# Patient Record
Sex: Female | Born: 1945
Health system: Southern US, Community
[De-identification: ages and names within clinical notes are randomized; demographics above are authoritative.]

## PROBLEM LIST (undated history)

## (undated) DIAGNOSIS — I517 Cardiomegaly: Secondary | ICD-10-CM

## (undated) DIAGNOSIS — J309 Allergic rhinitis, unspecified: Secondary | ICD-10-CM

## (undated) DIAGNOSIS — I7121 Aneurysm of the ascending aorta, without rupture: Secondary | ICD-10-CM

## (undated) DIAGNOSIS — I4892 Unspecified atrial flutter: Secondary | ICD-10-CM

## (undated) DIAGNOSIS — E039 Hypothyroidism, unspecified: Secondary | ICD-10-CM

## (undated) DIAGNOSIS — M75122 Complete rotator cuff tear or rupture of left shoulder, not specified as traumatic: Secondary | ICD-10-CM

## (undated) DIAGNOSIS — G4733 Obstructive sleep apnea (adult) (pediatric): Secondary | ICD-10-CM

## (undated) DIAGNOSIS — G4700 Insomnia, unspecified: Secondary | ICD-10-CM

## (undated) DIAGNOSIS — I10 Essential (primary) hypertension: Secondary | ICD-10-CM

## (undated) DIAGNOSIS — Z7901 Long term (current) use of anticoagulants: Secondary | ICD-10-CM

## (undated) DIAGNOSIS — M1990 Unspecified osteoarthritis, unspecified site: Secondary | ICD-10-CM

## (undated) DIAGNOSIS — T8859XA Other complications of anesthesia, initial encounter: Secondary | ICD-10-CM

## (undated) DIAGNOSIS — I77819 Aortic ectasia, unspecified site: Secondary | ICD-10-CM

## (undated) DIAGNOSIS — R001 Bradycardia, unspecified: Secondary | ICD-10-CM

## (undated) DIAGNOSIS — I700 Atherosclerosis of aorta: Secondary | ICD-10-CM

## (undated) DIAGNOSIS — R600 Localized edema: Secondary | ICD-10-CM

## (undated) DIAGNOSIS — I209 Angina pectoris, unspecified: Secondary | ICD-10-CM

## (undated) DIAGNOSIS — I5030 Unspecified diastolic (congestive) heart failure: Secondary | ICD-10-CM

## (undated) DIAGNOSIS — J449 Chronic obstructive pulmonary disease, unspecified: Secondary | ICD-10-CM

## (undated) DIAGNOSIS — Z87891 Personal history of nicotine dependence: Secondary | ICD-10-CM

## (undated) DIAGNOSIS — I4891 Unspecified atrial fibrillation: Secondary | ICD-10-CM

## (undated) DIAGNOSIS — G4730 Sleep apnea, unspecified: Secondary | ICD-10-CM

## (undated) DIAGNOSIS — Z9842 Cataract extraction status, left eye: Secondary | ICD-10-CM

## (undated) DIAGNOSIS — I2510 Atherosclerotic heart disease of native coronary artery without angina pectoris: Secondary | ICD-10-CM

## (undated) DIAGNOSIS — I739 Peripheral vascular disease, unspecified: Secondary | ICD-10-CM

## (undated) DIAGNOSIS — K219 Gastro-esophageal reflux disease without esophagitis: Secondary | ICD-10-CM

## (undated) DIAGNOSIS — E785 Hyperlipidemia, unspecified: Secondary | ICD-10-CM

## (undated) HISTORY — DX: Cardiomegaly: I51.7

## (undated) HISTORY — DX: Hyperlipidemia, unspecified: E78.5

## (undated) HISTORY — PX: APPENDECTOMY: SHX54

## (undated) HISTORY — PX: HYSTERECTOMY, VAGINAL, WITH SALPINGO-OOPHORECTOMY: SHX7589

## (undated) HISTORY — DX: Hypothyroidism, unspecified: E03.9

## (undated) HISTORY — PX: CORONARY ARTERY BYPASS GRAFT: SHX141

## (undated) HISTORY — DX: Atherosclerotic heart disease of native coronary artery without angina pectoris: I25.10

## (undated) HISTORY — DX: Allergic rhinitis, unspecified: J30.9

## (undated) HISTORY — PX: ABDOMINAL HYSTERECTOMY: SHX81

## (undated) HISTORY — DX: Essential (primary) hypertension: I10

## (undated) HISTORY — PX: TONSILLECTOMY: SUR1361

---

## 2000-09-14 ENCOUNTER — Other Ambulatory Visit: Admission: RE | Admit: 2000-09-14 | Discharge: 2000-09-14 | Payer: Self-pay | Admitting: *Deleted

## 2004-09-28 ENCOUNTER — Ambulatory Visit: Payer: Self-pay | Admitting: General Surgery

## 2004-10-20 ENCOUNTER — Ambulatory Visit: Payer: Self-pay

## 2004-11-09 ENCOUNTER — Ambulatory Visit: Payer: Self-pay | Admitting: Urology

## 2005-09-23 HISTORY — PX: CARDIAC CATHETERIZATION: SHX172

## 2005-10-14 ENCOUNTER — Other Ambulatory Visit: Payer: Self-pay

## 2005-10-14 ENCOUNTER — Inpatient Hospital Stay: Payer: Self-pay

## 2005-10-15 ENCOUNTER — Other Ambulatory Visit: Payer: Self-pay

## 2005-10-17 DIAGNOSIS — I2129 ST elevation (STEMI) myocardial infarction involving other sites: Secondary | ICD-10-CM

## 2005-10-17 HISTORY — PX: LEFT HEART CATH AND CORONARY ANGIOGRAPHY: CATH118249

## 2005-10-17 HISTORY — DX: ST elevation (STEMI) myocardial infarction involving other sites: I21.29

## 2005-10-18 ENCOUNTER — Other Ambulatory Visit: Payer: Self-pay

## 2005-10-18 HISTORY — PX: CORONARY ANGIOPLASTY WITH STENT PLACEMENT: SHX49

## 2005-10-19 ENCOUNTER — Other Ambulatory Visit: Payer: Self-pay

## 2005-10-20 ENCOUNTER — Other Ambulatory Visit: Payer: Self-pay

## 2005-11-09 ENCOUNTER — Encounter: Payer: Self-pay | Admitting: Internal Medicine

## 2005-11-16 ENCOUNTER — Ambulatory Visit: Payer: Self-pay

## 2005-11-22 ENCOUNTER — Ambulatory Visit: Payer: Self-pay | Admitting: Internal Medicine

## 2005-11-23 ENCOUNTER — Encounter: Payer: Self-pay | Admitting: Internal Medicine

## 2005-12-24 ENCOUNTER — Encounter: Payer: Self-pay | Admitting: Internal Medicine

## 2006-01-23 ENCOUNTER — Encounter: Payer: Self-pay | Admitting: Internal Medicine

## 2006-02-23 ENCOUNTER — Encounter: Payer: Self-pay | Admitting: Internal Medicine

## 2007-01-18 ENCOUNTER — Ambulatory Visit: Payer: Self-pay

## 2007-07-16 ENCOUNTER — Encounter: Payer: Self-pay | Admitting: Obstetrics & Gynecology

## 2007-07-25 ENCOUNTER — Encounter: Payer: Self-pay | Admitting: Obstetrics & Gynecology

## 2007-08-24 ENCOUNTER — Encounter: Payer: Self-pay | Admitting: Obstetrics & Gynecology

## 2008-04-01 ENCOUNTER — Ambulatory Visit: Payer: Self-pay | Admitting: Family Medicine

## 2008-10-09 ENCOUNTER — Ambulatory Visit: Payer: Self-pay | Admitting: Family Medicine

## 2009-04-02 ENCOUNTER — Ambulatory Visit: Payer: Self-pay | Admitting: Family Medicine

## 2009-11-20 ENCOUNTER — Ambulatory Visit: Payer: Self-pay | Admitting: General Surgery

## 2009-11-20 LAB — HM COLONOSCOPY

## 2010-02-24 DIAGNOSIS — Z951 Presence of aortocoronary bypass graft: Secondary | ICD-10-CM

## 2010-02-24 HISTORY — PX: CORONARY ARTERY BYPASS GRAFT: SHX141

## 2010-02-24 HISTORY — DX: Presence of aortocoronary bypass graft: Z95.1

## 2010-03-25 ENCOUNTER — Encounter: Payer: Self-pay | Admitting: Internal Medicine

## 2010-08-16 ENCOUNTER — Ambulatory Visit: Payer: Self-pay | Admitting: Family Medicine

## 2010-12-01 LAB — HM DEXA SCAN: HM Dexa Scan: NORMAL

## 2011-09-06 ENCOUNTER — Ambulatory Visit: Payer: Self-pay | Admitting: Family Medicine

## 2012-10-09 ENCOUNTER — Ambulatory Visit: Payer: Self-pay | Admitting: Family Medicine

## 2013-05-03 ENCOUNTER — Encounter: Payer: Self-pay | Admitting: Pulmonary Disease

## 2013-05-06 ENCOUNTER — Institutional Professional Consult (permissible substitution): Payer: Medicare Other | Admitting: Pulmonary Disease

## 2013-05-24 ENCOUNTER — Ambulatory Visit (INDEPENDENT_AMBULATORY_CARE_PROVIDER_SITE_OTHER): Payer: Medicare Other | Admitting: Pulmonary Disease

## 2013-05-24 ENCOUNTER — Encounter: Payer: Self-pay | Admitting: Pulmonary Disease

## 2013-05-24 VITALS — BP 130/62 | HR 58 | Ht 66.5 in | Wt 180.0 lb

## 2013-05-24 DIAGNOSIS — J449 Chronic obstructive pulmonary disease, unspecified: Secondary | ICD-10-CM

## 2013-05-24 DIAGNOSIS — R0602 Shortness of breath: Secondary | ICD-10-CM

## 2013-05-24 MED ORDER — ALBUTEROL SULFATE HFA 108 (90 BASE) MCG/ACT IN AERS: 2.0000 | INHALATION_SPRAY | Freq: Four times a day (QID) | RESPIRATORY_TRACT | Status: DC | PRN

## 2013-05-24 NOTE — Patient Instructions (Signed)
We will order a Chest x-ray for you Use Spiriva daily no matter how you feel Use the albuterol 2 puffs every 4-6 hours as needed for shortness of breath  We will see you back in 4 weeks or sooner if needed

## 2013-05-24 NOTE — Progress Notes (Signed)
Subjective:    Patient ID: Penny Reed, female    DOB: 27-Mar-1946, 68 y.o.   MRN: [id number]  HPI  PCP [facility]  Ms. Eichholz is here to see me for shortness of breath.  She has noticed shortness of breath with high intesnsity exercise, climbing stairs, carrying weights.  She has noticed this for the last 3-4 months.  She was told once that she might have COPD.  She previously smoked 1-2 packs a week (closet smoker) and quit in 2007.  She smoked for about 40 years.  No chronic cough.  No mucus production.  Childhood normal without respiratory problems.  There are not triggers in the environement or positional changes that make it worse.  She has not had chest pain during this time.  She has not noticed any bleeding.   She does not have leg swelling.  She worked out 5-7 times a week and can spend an hour on an elliptical for 40 minutes to an hour.  She notices dyspnea with working out, but she hasn't necessarily slowed down.   She has been seen by a cardiologist recently who felt that she didn't have clear cardiac cause of dyspnea.  She a CABG in 2007.  Past Medical History  Diagnosis Date  . CAD (coronary artery disease)   . Hyperlipidemia   . Allergic rhinitis   . Hypothyroidism   . Hypertension   . LVH (left ventricular hypertrophy)      Family History  Problem Relation Age of Onset  . Diabetes Mother   . Coronary artery disease Father   . Heart attack Father   . Hyperlipidemia Sister   . Heart attack Brother   . Cancer - Colon Paternal Grandmother   . Asthma Sister   . Scleroderma Sister   . Thyroid disease Sister      History   Social History  . Marital Status: Single    Spouse Name: N/A    Number of Children: N/A  . Years of Education: N/A   Occupational History  . Not on file.   Social History Main Topics  . Smoking status: Former Smoker -- 0.25 packs/day for 25 years    Types: Cigarettes    Quit date: 04/25/2005  . Smokeless tobacco: Never Used  . Alcohol  Use: Yes     Comment: 5 drinks/wk  . Drug Use: No  . Sexual Activity: Not on file   Other Topics Concern  . Not on file   Social History Narrative  . No narrative on file     Allergies  Allergen Reactions  . Adhesive [Tape]     Band-aids irritate skin     Outpatient Prescriptions Prior to Visit  Medication Sig Dispense Refill  . 5-Hydroxytryptophan (5-HTP) 100 MG CAPS Take 1 capsule by mouth at bedtime.      [name] aspirin 81 MG tablet Take 81 mg by mouth daily.      [name] atorvastatin (LIPITOR) 80 MG tablet Take 80 mg by mouth daily.      [name] azelastine (ASTELIN) 137 MCG/SPRAY nasal spray Place 2 sprays into both nostrils 2 (two) times daily. Use in each nostril as directed      . Calcium Carbonate (CALCIUM 600 PO) Take 1 tablet by mouth daily.      [name] estradiol (ESTRACE) 0.5 MG tablet Take 0.5 mg by mouth daily.      [name] glucosamine-chondroitin 500-400 MG tablet Take 2 tablets by mouth daily.      [name]  levothyroxine (SYNTHROID, LEVOTHROID) 125 MCG tablet Take 125 mcg by mouth daily before breakfast.      . Multiple Vitamin (MULTIVITAMIN) tablet Take 1 tablet by mouth daily.      . Omega-3 Fatty Acids (FISH OIL) 1000 MG CAPS Take 4 capsules by mouth daily.      . quinapril (ACCUPRIL) 20 MG tablet Take 20 mg by mouth daily.      . cetirizine (ZYRTEC) 10 MG tablet Take 10 mg by mouth daily as needed.       . zolpidem (AMBIEN) 10 MG tablet Take 10 mg by mouth at bedtime as needed for sleep.       No facility-administered medications prior to visit.      Review of Systems  Constitutional: Negative for fever and unexpected weight change.  HENT: Positive for sinus pressure. Negative for congestion, dental problem, ear pain, nosebleeds, postnasal drip, rhinorrhea, sneezing, sore throat and trouble swallowing.   Eyes: Negative for redness and itching.  Respiratory: Positive for shortness of breath. Negative for cough, chest tightness and wheezing.   Cardiovascular: Negative for palpitations and  leg swelling.  Gastrointestinal: Negative for nausea and vomiting.  Genitourinary: Negative for dysuria.  Musculoskeletal: Negative for joint swelling.  Skin: Negative for rash.  Neurological: Negative for headaches.  Hematological: Does not bruise/bleed easily.  Psychiatric/Behavioral: Negative for dysphoric mood. The patient is not nervous/anxious.        Objective:   Physical Exam  Filed Vitals:   05/24/13 1426  BP: 130/62  Pulse: 58  Height: 5' 6.5" (1.689 m)  Weight: 180 lb (81.647 kg)  SpO2: 98%   Gen: well appearing, no acute distress HEENT: NCAT, PERRL, EOMi, OP clear, neck supple without masses PULM: CTA B CV: RRR, no mgr, no JVD AB: BS+, soft, nontender, no hsm Ext: warm, no edema, no clubbing, no cyanosis Derm: no rash or skin breakdown Neuro: A&Ox4, CN II-XII intact, strength 5/5 in all 4 extremities  05/24/2013 spirometry> moderate airflow obstruction (FEV1 77% pred)     Assessment & Plan:   COPD, moderate COPD: GOLD Grade A Combined recommendations from the [employer], [employer] of [employer]icians, Designer, television/film set, European Respiratory Society (Qaseem A et al, Ann Intern Med. 2011;155(3):179) recommends tobacco cessation, pulmonary rehab (for symptomatic patients with an FEV1 < 50% predicted), supplemental oxygen (for patients with SaO2 <88% or paO2 <55), and appropriate bronchodilator therapy.  In regards to long acting bronchodilators, they recommend monotherapy (FEV1 60-80% with symptoms weak evidence, FEV1 with symptoms <60% strong evidence), or combination therapy (FEV1 <60% with symptoms, strong recommendation, moderate evidence).  One should also provide patients with annual immunizations and consider therapy for prevention of COPD exacerbations (ie. roflumilast or azithromycin) when appopriate.  -obtain CXR -O2 therapy: Not indicated -Immunizations: UTD -Tobacco use: quit 2007 -Exercise: Encouraged regular  activity -Bronchodilator therapy: Add spiriva for a month and follow up, encouraged HFA Albuterol prn and before exercise -Exacerbation prevention: Spiriva     Updated Medication List Outpatient Encounter Prescriptions as of 05/24/2013  Medication Sig  . 5-Hydroxytryptophan (5-HTP) 100 MG CAPS Take 1 capsule by mouth at bedtime.  [name] aspirin 81 MG tablet Take 81 mg by mouth daily.  [name] atorvastatin (LIPITOR) 80 MG tablet Take 80 mg by mouth daily.  [name] azelastine (ASTELIN) 137 MCG/SPRAY nasal spray Place 2 sprays into both nostrils 2 (two) times daily. Use in each nostril as directed  . Calcium Carbonate (CALCIUM 600 PO) Take 1 tablet by mouth  daily.  . estradiol (ESTRACE) 0.5 MG tablet Take 0.5 mg by mouth daily.  [name]. glucosamine-chondroitin 500-400 MG tablet Take 2 tablets by mouth daily.  [name]. levothyroxine (SYNTHROID, LEVOTHROID) 125 MCG tablet Take 125 mcg by mouth daily before breakfast.  . Multiple Vitamin (MULTIVITAMIN) tablet Take 1 tablet by mouth daily.  . Omega-3 Fatty Acids (FISH OIL) 1000 MG CAPS Take 4 capsules by mouth daily.  [name]. omeprazole (PRILOSEC) 20 MG capsule   . quinapril (ACCUPRIL) 20 MG tablet Take 20 mg by mouth daily.  [name]. albuterol (PROAIR HFA) 108 (90 BASE) MCG/ACT inhaler Inhale 2 puffs into the lungs every 6 (six) hours as needed for wheezing or shortness of breath.  . cetirizine (ZYRTEC) 10 MG tablet Take 10 mg by mouth daily as needed.   . [DISCONTINUED] zolpidem (AMBIEN) 10 MG tablet Take 10 mg by mouth at bedtime as needed for sleep.

## 2013-05-24 NOTE — Assessment & Plan Note (Addendum)
COPD: GOLD Grade A Combined recommendations from the [employer], [employer], Designer, television/film setAmerican Thoracic Society, European Respiratory Society (Qaseem A et al, Ann Intern Med. 2011;155(3):179) recommends tobacco cessation, pulmonary rehab (for symptomatic patients with an FEV1 < 50% predicted), supplemental oxygen (for patients with SaO2 <88% or paO2 <55), and appropriate bronchodilator therapy.  In regards to long acting bronchodilators, they recommend monotherapy (FEV1 60-80% with symptoms weak evidence, FEV1 with symptoms <60% strong evidence), or combination therapy (FEV1 <60% with symptoms, strong recommendation, moderate evidence).  One should also provide patients with annual immunizations and consider therapy for prevention of COPD exacerbations (ie. roflumilast or azithromycin) when appopriate.  -obtain CXR -O2 therapy: Not indicated -Immunizations: UTD -Tobacco use: quit 2007 -Exercise: Encouraged regular activity -Bronchodilator therapy: Add spiriva for a month and follow up, encouraged HFA Albuterol prn and before exercise -Exacerbation prevention: Spiriva

## 2013-06-14 ENCOUNTER — Telehealth: Payer: Self-pay | Admitting: Pulmonary Disease

## 2013-06-14 NOTE — Telephone Encounter (Signed)
Called and spoke w/ pt. Advised her they do CXR over at [place] office. Nothing further needed

## 2013-06-19 ENCOUNTER — Ambulatory Visit (INDEPENDENT_AMBULATORY_CARE_PROVIDER_SITE_OTHER)
Admission: RE | Admit: 2013-06-19 | Discharge: 2013-06-19 | Disposition: A | Discharge status: Home / Self Care | Payer: Medicare Other | Source: Ambulatory Visit | Attending: Pulmonary Disease | Admitting: Pulmonary Disease

## 2013-06-19 DIAGNOSIS — R0602 Shortness of breath: Secondary | ICD-10-CM

## 2013-06-21 ENCOUNTER — Telehealth: Payer: Self-pay

## 2013-06-21 ENCOUNTER — Encounter: Payer: Self-pay | Admitting: Pulmonary Disease

## 2013-06-21 ENCOUNTER — Ambulatory Visit (INDEPENDENT_AMBULATORY_CARE_PROVIDER_SITE_OTHER): Payer: Medicare Other | Admitting: Pulmonary Disease

## 2013-06-21 VITALS — BP 136/64 | HR 60 | Ht 66.5 in | Wt 181.8 lb

## 2013-06-21 DIAGNOSIS — Z720 Tobacco use: Secondary | ICD-10-CM

## 2013-06-21 DIAGNOSIS — F17200 Nicotine dependence, unspecified, uncomplicated: Secondary | ICD-10-CM

## 2013-06-21 DIAGNOSIS — J449 Chronic obstructive pulmonary disease, unspecified: Secondary | ICD-10-CM

## 2013-06-21 MED ORDER — TIOTROPIUM BROMIDE MONOHYDRATE 18 MCG IN CAPS: 18.0000 ug | ORAL_CAPSULE | Freq: Every day | RESPIRATORY_TRACT | Status: DC

## 2013-06-21 NOTE — Progress Notes (Signed)
   Subjective:    Patient ID: Penny Reed, female    DOB: March 23, 1946, 68 y.o.   MRN: [id number]  Synopsis: GOLD Grade A COPD, 05/24/2013 spirometry> moderate airflow obstruction (FEV1 77% pred)  HPI  06/21/2013 ROV > Eber Jonesarolyn really did well with Spiriva and albuterol. She stated that she could tell a difference when she exercised while taking the medicines and when she ran out of the Spiriva a few days ago she noticed a difference in dyspnea with exertion.  She is doing well otherwise. She sometimes forgets to take the albuterol before exercise.  She has not experienced dyspnea outside of exercise.  Past Medical History  Diagnosis Date  . CAD (coronary artery disease)   . Hyperlipidemia   . Allergic rhinitis   . Hypothyroidism   . Hypertension   . LVH (left ventricular hypertrophy)      Review of Systems     Objective:   Physical Exam Filed Vitals:   06/21/13 1447  BP: 136/64  Pulse: 60  Height: 5' 6.5" (1.689 m)  Weight: 181 lb 12.8 oz (82.464 kg)  SpO2: 98%  RA  Gen: well appearing HEENT:NCAT, EOMi, OP clear PULM: CTA B CV: RRR, no MGR AB: BS+, soft, nontender Ext: warm, no edema      Assessment & Plan:   COPD, moderate Eber JonesCarolyn has done well with Spiriva and albuterol for her COPD.  Plan: -Continue Spiriva daily -Continue albuterol before exercise and as needed  Tobacco abuse Eber JonesCarolyn smoked between one half and one pack of cigarettes daily for 40 years and quit 7 years ago. We talked about it for a while today and she believes that she did smoke more than 30 pack years. Based on this, I believe that she needs to undergo low-dose CT scan screening for lung cancer. She would prefer to wait until Medicare will pay for this which should happen this year sometime.  Plan: -Order low-dose CT screening for lung cancer later in the year when Medicare pays for it    Updated Medication List Outpatient Encounter Prescriptions as of 06/21/2013  Medication Sig  .  5-Hydroxytryptophan (5-HTP) 100 MG CAPS Take 1 capsule by mouth at bedtime.  [name]. albuterol (PROAIR HFA) 108 (90 BASE) MCG/ACT inhaler Inhale 2 puffs into the lungs every 6 (six) hours as needed for wheezing or shortness of breath.  [name]. aspirin 81 MG tablet Take 81 mg by mouth daily.  [name]. atorvastatin (LIPITOR) 80 MG tablet Take 80 mg by mouth daily.  [name]. azelastine (ASTELIN) 137 MCG/SPRAY nasal spray Place 2 sprays into both nostrils 2 (two) times daily. Use in each nostril as directed  . Calcium Carbonate (CALCIUM 600 PO) Take 1 tablet by mouth daily.  . cetirizine (ZYRTEC) 10 MG tablet Take 10 mg by mouth daily as needed.   [name]. estradiol (ESTRACE) 0.5 MG tablet Take 0.5 mg by mouth daily.  [name]. glucosamine-chondroitin 500-400 MG tablet Take 2 tablets by mouth daily.  [name]. levothyroxine (SYNTHROID, LEVOTHROID) 125 MCG tablet Take 125 mcg by mouth daily before breakfast.  . Multiple Vitamin (MULTIVITAMIN) tablet Take 1 tablet by mouth daily.  . Omega-3 Fatty Acids (FISH OIL) 1000 MG CAPS Take 4 capsules by mouth daily.  [name]. omeprazole (PRILOSEC) 20 MG capsule   . quinapril (ACCUPRIL) 20 MG tablet Take 20 mg by mouth daily.

## 2013-06-21 NOTE — Progress Notes (Signed)
Pt was made aware in clinic today.  Nothing further needed.

## 2013-06-21 NOTE — Assessment & Plan Note (Signed)
Penny JonesCarolyn smoked between one half and one pack of cigarettes daily for 40 years and quit 7 years ago. We talked about it for a while today and she believes that she did smoke more than 30 pack years. Based on this, I believe that she needs to undergo low-dose CT scan screening for lung cancer. She would prefer to wait until Medicare will pay for this which should happen this year sometime.  Plan: -Order low-dose CT screening for lung cancer later in the year when Medicare pays for it

## 2013-06-21 NOTE — Assessment & Plan Note (Signed)
Penny JonesCarolyn has done well with Spiriva and albuterol for her COPD.  Plan: -Continue Spiriva daily -Continue albuterol before exercise and as needed

## 2013-06-21 NOTE — Patient Instructions (Signed)
When we can find a site that will accept Medicare payment for lung cancer screening we will send you Take spiriva daily, use albuterol before exercise We will see you back in 6 months or sooner if needed

## 2013-06-21 NOTE — Telephone Encounter (Signed)
Message copied by Velvet BatheAULFIELD, ASHLEY L on Fri Jun 21, 2013  3:51 PM ------      Message from: Max FickleMCQUAID, DOUGLAS B      Created: Thu Jun 20, 2013 11:54 AM       A,            Please let her know this showed epmhysema.            Thanks      B ------

## 2013-08-06 DIAGNOSIS — J449 Chronic obstructive pulmonary disease, unspecified: Secondary | ICD-10-CM | POA: Insufficient documentation

## 2013-08-06 DIAGNOSIS — R0602 Shortness of breath: Secondary | ICD-10-CM | POA: Insufficient documentation

## 2013-08-06 DIAGNOSIS — E079 Disorder of thyroid, unspecified: Secondary | ICD-10-CM | POA: Insufficient documentation

## 2013-08-06 DIAGNOSIS — K219 Gastro-esophageal reflux disease without esophagitis: Secondary | ICD-10-CM | POA: Insufficient documentation

## 2013-10-11 ENCOUNTER — Ambulatory Visit: Payer: Self-pay | Admitting: Family Medicine

## 2013-10-11 LAB — HM MAMMOGRAPHY: HM Mammogram: NORMAL

## 2014-01-03 ENCOUNTER — Other Ambulatory Visit: Payer: Self-pay

## 2014-01-03 MED ORDER — TIOTROPIUM BROMIDE MONOHYDRATE 18 MCG IN CAPS: 18.0000 ug | ORAL_CAPSULE | Freq: Every day | RESPIRATORY_TRACT | Status: DC

## 2014-03-13 LAB — LIPID PANEL
Cholesterol: 157 mg/dL (ref 0–200)
HDL: 68 mg/dL (ref 35–70)
LDL Cholesterol: 75 mg/dL
LDl/HDL Ratio: 1.1
Triglycerides: 70 mg/dL (ref 40–160)

## 2014-03-13 LAB — CBC AND DIFFERENTIAL
HEMATOCRIT: 39 % (ref 36–46)
HEMOGLOBIN: 13.6 g/dL (ref 12.0–16.0)
Neutrophils Absolute: 54 /uL
Platelets: 203 10*3/uL (ref 150–399)
WBC: 5 10^3/mL

## 2014-03-13 LAB — BASIC METABOLIC PANEL
BUN: 13 mg/dL (ref 4–21)
CREATININE: 0.8 mg/dL (ref <=1.1)
GLUCOSE: 98 mg/dL
Potassium: 4.6 mmol/L (ref 3.4–5.3)
Sodium: 142 mmol/L (ref 137–147)

## 2014-03-13 LAB — HEPATIC FUNCTION PANEL
ALK PHOS: 68 U/L (ref 25–125)
ALT: 31 U/L (ref 7–35)
AST: 43 U/L — AB (ref 13–35)
Bilirubin, Total: 0.5 mg/dL

## 2014-03-13 LAB — TSH: TSH: 3.29 u[IU]/mL (ref <=5.90)

## 2014-04-02 LAB — HM PAP SMEAR: HM PAP: NORMAL

## 2014-06-11 DIAGNOSIS — S83281A Other tear of lateral meniscus, current injury, right knee, initial encounter: Secondary | ICD-10-CM | POA: Diagnosis not present

## 2014-07-17 ENCOUNTER — Other Ambulatory Visit: Payer: Self-pay | Admitting: *Deleted

## 2014-07-17 MED ORDER — TIOTROPIUM BROMIDE MONOHYDRATE 18 MCG IN CAPS: 18.0000 ug | ORAL_CAPSULE | Freq: Every day | RESPIRATORY_TRACT | Status: DC

## 2014-07-17 NOTE — Progress Notes (Signed)
Paper refill approved electronically.

## 2014-07-29 DIAGNOSIS — Z1389 Encounter for screening for other disorder: Secondary | ICD-10-CM | POA: Diagnosis not present

## 2014-07-29 DIAGNOSIS — I2510 Atherosclerotic heart disease of native coronary artery without angina pectoris: Secondary | ICD-10-CM | POA: Diagnosis not present

## 2014-09-21 DIAGNOSIS — E785 Hyperlipidemia, unspecified: Secondary | ICD-10-CM | POA: Insufficient documentation

## 2014-09-21 DIAGNOSIS — I517 Cardiomegaly: Secondary | ICD-10-CM | POA: Insufficient documentation

## 2014-09-21 DIAGNOSIS — E7849 Other hyperlipidemia: Secondary | ICD-10-CM | POA: Insufficient documentation

## 2014-09-21 DIAGNOSIS — E039 Hypothyroidism, unspecified: Secondary | ICD-10-CM | POA: Insufficient documentation

## 2014-09-21 DIAGNOSIS — I2510 Atherosclerotic heart disease of native coronary artery without angina pectoris: Secondary | ICD-10-CM | POA: Insufficient documentation

## 2014-09-21 DIAGNOSIS — J309 Allergic rhinitis, unspecified: Secondary | ICD-10-CM | POA: Insufficient documentation

## 2014-09-21 DIAGNOSIS — I10 Essential (primary) hypertension: Secondary | ICD-10-CM | POA: Insufficient documentation

## 2014-09-25 ENCOUNTER — Encounter: Payer: Self-pay | Admitting: Pulmonary Disease

## 2014-09-25 ENCOUNTER — Telehealth: Payer: Self-pay

## 2014-09-25 ENCOUNTER — Ambulatory Visit
Admission: RE | Admit: 2014-09-25 | Discharge: 2014-09-25 | Disposition: A | Discharge status: Home / Self Care | Payer: Medicare Other | Source: Ambulatory Visit | Attending: Pulmonary Disease | Admitting: Pulmonary Disease

## 2014-09-25 ENCOUNTER — Ambulatory Visit (INDEPENDENT_AMBULATORY_CARE_PROVIDER_SITE_OTHER): Payer: Medicare Other | Admitting: Pulmonary Disease

## 2014-09-25 ENCOUNTER — Ambulatory Visit: Payer: Self-pay | Admitting: Pulmonary Disease

## 2014-09-25 VITALS — BP 134/60 | HR 60 | Ht 65.5 in | Wt 191.0 lb

## 2014-09-25 DIAGNOSIS — J984 Other disorders of lung: Secondary | ICD-10-CM | POA: Insufficient documentation

## 2014-09-25 DIAGNOSIS — R0602 Shortness of breath: Secondary | ICD-10-CM | POA: Diagnosis not present

## 2014-09-25 DIAGNOSIS — R0600 Dyspnea, unspecified: Secondary | ICD-10-CM

## 2014-09-25 DIAGNOSIS — R069 Unspecified abnormalities of breathing: Secondary | ICD-10-CM | POA: Insufficient documentation

## 2014-09-25 DIAGNOSIS — J449 Chronic obstructive pulmonary disease, unspecified: Secondary | ICD-10-CM

## 2014-09-25 LAB — PULMONARY FUNCTION TEST
DL/VA % pred: 83 %
DL/VA: 4.16 ml/min/mmHg/L
DLCO unc % pred: 74 %
DLCO unc: 19.64 ml/min/mmHg
FEF 25-75 POST: 2.34 L/s
FEF 25-75 Pre: 1.91 L/sec
FEF2575-%CHANGE-POST: 22 %
FEF2575-%PRED-POST: 110 %
FEF2575-%Pred-Pre: 90 %
FEV1-%CHANGE-POST: 5 %
FEV1-%Pred-Post: 96 %
FEV1-%Pred-Pre: 91 %
FEV1-PRE: 2.28 L
FEV1-Post: 2.4 L
FEV1FVC-%Change-Post: 5 %
FEV1FVC-%Pred-Pre: 98 %
FEV6-%CHANGE-POST: 0 %
FEV6-%PRED-POST: 95 %
FEV6-%Pred-Pre: 95 %
FEV6-POST: 2.99 L
FEV6-PRE: 2.99 L
FEV6FVC-%CHANGE-POST: 0 %
FEV6FVC-%Pred-Post: 104 %
FEV6FVC-%Pred-Pre: 104 %
FVC-%CHANGE-POST: 0 %
FVC-%PRED-PRE: 91 %
FVC-%Pred-Post: 91 %
FVC-Post: 3 L
FVC-Pre: 3.01 L
POST FEV1/FVC RATIO: 80 %
PRE FEV6/FVC RATIO: 100 %
Post FEV6/FVC ratio: 100 %
Pre FEV1/FVC ratio: 76 %
RV % PRED: 99 %
RV: 2.19 L
TLC % pred: 95 %
TLC: 5.06 L

## 2014-09-25 NOTE — Telephone Encounter (Signed)
lmtcb X1 for pt to see if she could come in earlier today for her appt per BQ.

## 2014-09-25 NOTE — Telephone Encounter (Signed)
Pt given number for pt to call Morrie She[place]hley [place].

## 2014-09-25 NOTE — Patient Instructions (Signed)
We will call you with the results of the chest x-ray  After I have seen the chest x-ray and compared it to the results of your pulmonary function testing I will she know if there is more testing that I recommend at this time  Make sure you mention the shortness of breath to your cardiologist and your primary care physician, I recommend that your primary care physician check a CBC (complete blood count) when you see him or her next  I recommend that she remain active with exercise  Stop Spiriva and try 1 puff daily of the Anoro, please call us to let us know if you feel that this drug makes you breathe better, then we will prescribe the medication  We will see you back in 3 months or sooner if needed

## 2014-09-25 NOTE — Assessment & Plan Note (Addendum)
Penny Reed has been experiencing increasing dyspnea recently. Her airflow obstruction in 2015 was only moderate.  While everyone's lung function does decline as we age, the lack of exacerbation in the last year for Penny Reed makes me think that there may be another cause for her increasing dyspnea. Her lungs are clear on exam today, she has no history of bleeding, she does not describe chest pain, she does not appear volume overloaded, and her neurologic exam is within normal limits. Further, her oxygenation on ambulation was normal.  So at this time on first glance assessment I cannot identify clear cause of worsening dyspnea.  Plan: Full pulmonary function test in the office today Repeat hemoglobin when she sees her primary care doctor next Chest x-ray today Discuss dyspnea with cardiologist on next visit Try changing Spiriva to Anoro Continue aggressive exercise routine Follow-up 3 months or sooner if needed

## 2014-09-25 NOTE — Assessment & Plan Note (Signed)
This has been a stable interval and there has not been any exacerbations. However, she is complaining of worsening dyspnea, see discussion below.  Plan: Trial of Anoro instead of Spiriva, call us if improvement Otherwise follow-up 3 months

## 2014-09-25 NOTE — Progress Notes (Signed)
PFT peformed today. 

## 2014-09-25 NOTE — Progress Notes (Signed)
Subjective:    Patient ID: Penny AranCarolyn H Reed, female    DOB: 06-Apr-1946, 69 y.o.   MRN: [id number]  Synopsis: GOLD Grade A COPD, 05/24/2013 spirometry> moderate airflow obstruction (FEV1 77% pred)  HPI Chief Complaint  Patient presents with  . Follow-up    CAT score 13 Pt has noticed a change in breathing worsening, chest tightness/pain and non productive cough and no wheezing.   Penny Reed has not had an exacerbation of COPD since she saw me the last time. She remains compliant with her Spiriva. She continues to exercise on a routine basis nearly 6 times per week. This involves pilates, cardio exercises including workouts on the elliptical machine, strength exercises. She has noticed increasing shortness of breath over the last several months. She says that she is still capable of completing her exercise routines but she often has to stop to catch her breath more frequently than before. This is associated with an ill-defined chest tightness. She says it is not chest pain nor does it radiate to her neck or arm or jaw. She says that usually rest for a few seconds will improve her symptoms. She has tried taking albuterol before exercise but this does not make much of a difference. She denies changes in her weight. There is no leg swelling. She does not have a cough or mucus production.  Past Medical History  Diagnosis Date  . CAD (coronary artery disease)   . Hyperlipidemia   . Allergic rhinitis   . Hypothyroidism   . Hypertension   . LVH (left ventricular hypertrophy)      Review of Systems  Constitutional: Negative for fever, chills and fatigue.  HENT: Negative for postnasal drip, rhinorrhea and sinus pressure.   Respiratory: Positive for chest tightness and shortness of breath. Negative for cough and wheezing.   Cardiovascular: Negative for chest pain, palpitations and leg swelling.       Objective:   Physical Exam Filed Vitals:   09/25/14 1409  BP: 134/60  Pulse: 60  Height: 5'  5.5" (1.664 m)  Weight: 191 lb (86.637 kg)  SpO2: 97%  RA  Ambulated 500 feet on RA and O2 saturation remained at or above 99%  Gen: well appearing HENT: OP clear, TM's clear, neck supple PULM: CTA B, normal percussion CV: RRR, no mgr, trace edema GI: BS+, soft, nontender Derm: no cyanosis or rash Psyche: normal mood and affect  05/2013 CXR images reviewed again> clear lung fields, no infiltrates  02/2014 Labs from PCP reviewed: Hgb 13.6     Assessment & Plan:   COPD, moderate This has been a stable interval and there has not been any exacerbations. However, she is complaining of worsening dyspnea, see discussion below.  Plan: Trial of Anoro instead of Spiriva, call us if improvement Otherwise follow-up 3 months   Dyspnea Penny Reed has been experiencing increasing dyspnea recently. Her airflow obstruction in 2015 was only moderate.  While everyone's lung function does decline as we age, the lack of exacerbation in the last year for Penny Reed makes me think that there may be another cause for her increasing dyspnea. Her lungs are clear on exam today, she has no history of bleeding, she does not describe chest pain, she does not appear volume overloaded, and her neurologic exam is within normal limits. Further, her oxygenation on ambulation was normal.  So at this time on first glance assessment I cannot identify clear cause of worsening dyspnea.  Plan: Full pulmonary function test in the office today  Repeat hemoglobin when she sees her primary care doctor next Chest x-ray today Discuss dyspnea with cardiologist on next visit Try changing Spiriva to Anoro Continue aggressive exercise routine Follow-up 3 months or sooner if needed      Updated Medication List Outpatient Encounter Prescriptions as of 09/25/2014  Medication Sig  . 5-Hydroxytryptophan (5-HTP) 100 MG CAPS Take 1 capsule by mouth at bedtime.  [name] albuterol (PROAIR HFA) 108 (90 BASE) MCG/ACT inhaler Inhale 2 puffs  into the lungs every 6 (six) hours as needed for wheezing or shortness of breath.  [name] aspirin 81 MG tablet Take 81 mg by mouth daily.  [name] atorvastatin (LIPITOR) 80 MG tablet Take 80 mg by mouth daily.  [name] azelastine (ASTELIN) 137 MCG/SPRAY nasal spray Place 2 sprays into both nostrils 2 (two) times daily. Use in each nostril as directed  . Calcium Carbonate (CALCIUM 600 PO) Take 1 tablet by mouth daily.  . cetirizine (ZYRTEC) 10 MG tablet Take 10 mg by mouth daily as needed.   [name] estradiol (ESTRACE) 0.5 MG tablet Take 0.5 mg by mouth daily.  [name] glucosamine-chondroitin 500-400 MG tablet Take 2 tablets by mouth daily.  [name] levothyroxine (SYNTHROID, LEVOTHROID) 125 MCG tablet Take 125 mcg by mouth daily before breakfast.  . Multiple Vitamin (MULTIVITAMIN) tablet Take 1 tablet by mouth daily.  . Omega-3 Fatty Acids (FISH OIL) 1000 MG CAPS Take 4 capsules by mouth daily.  [name] omeprazole (PRILOSEC) 20 MG capsule   . quinapril (ACCUPRIL) 20 MG tablet Take 20 mg by mouth daily.  [name] tiotropium (SPIRIVA HANDIHALER) 18 MCG inhalation capsule Place 1 capsule (18 mcg total) into inhaler and inhale daily.  . [DISCONTINUED] OMEGA-3 FATTY ACIDS-VITAMIN E PO Take 1,000 mg by mouth daily.  . [DISCONTINUED] Probiotic CAPS Take by mouth.   No facility-administered encounter medications on file as of 09/25/2014.

## 2014-09-26 ENCOUNTER — Ambulatory Visit: Payer: Self-pay | Admitting: Pulmonary Disease

## 2014-10-08 DIAGNOSIS — R0602 Shortness of breath: Secondary | ICD-10-CM | POA: Diagnosis not present

## 2014-10-08 DIAGNOSIS — R011 Cardiac murmur, unspecified: Secondary | ICD-10-CM | POA: Diagnosis not present

## 2014-10-08 DIAGNOSIS — I208 Other forms of angina pectoris: Secondary | ICD-10-CM | POA: Diagnosis not present

## 2014-10-08 DIAGNOSIS — I2510 Atherosclerotic heart disease of native coronary artery without angina pectoris: Secondary | ICD-10-CM | POA: Diagnosis not present

## 2014-10-24 DIAGNOSIS — R011 Cardiac murmur, unspecified: Secondary | ICD-10-CM | POA: Diagnosis not present

## 2014-10-24 DIAGNOSIS — R0602 Shortness of breath: Secondary | ICD-10-CM | POA: Diagnosis not present

## 2014-10-24 DIAGNOSIS — I208 Other forms of angina pectoris: Secondary | ICD-10-CM | POA: Diagnosis not present

## 2014-10-28 ENCOUNTER — Ambulatory Visit: Payer: Medicare Other | Admitting: Pulmonary Disease

## 2014-10-31 DIAGNOSIS — I25119 Atherosclerotic heart disease of native coronary artery with unspecified angina pectoris: Secondary | ICD-10-CM | POA: Diagnosis not present

## 2014-10-31 DIAGNOSIS — J449 Chronic obstructive pulmonary disease, unspecified: Secondary | ICD-10-CM | POA: Diagnosis not present

## 2014-10-31 DIAGNOSIS — I209 Angina pectoris, unspecified: Secondary | ICD-10-CM | POA: Diagnosis not present

## 2014-10-31 DIAGNOSIS — E669 Obesity, unspecified: Secondary | ICD-10-CM | POA: Diagnosis not present

## 2014-11-17 ENCOUNTER — Other Ambulatory Visit: Payer: Self-pay

## 2014-11-17 MED ORDER — ALBUTEROL SULFATE HFA 108 (90 BASE) MCG/ACT IN AERS: 2.0000 | INHALATION_SPRAY | Freq: Four times a day (QID) | RESPIRATORY_TRACT | Status: DC | PRN

## 2014-12-09 ENCOUNTER — Ambulatory Visit (INDEPENDENT_AMBULATORY_CARE_PROVIDER_SITE_OTHER): Payer: Medicare Other | Admitting: Family Medicine

## 2014-12-09 ENCOUNTER — Encounter: Payer: Self-pay | Admitting: Family Medicine

## 2014-12-09 VITALS — BP 148/64 | HR 60 | Temp 98.2°F | Resp 16 | Ht 67.0 in | Wt 189.0 lb

## 2014-12-09 DIAGNOSIS — I10 Essential (primary) hypertension: Secondary | ICD-10-CM | POA: Insufficient documentation

## 2014-12-09 DIAGNOSIS — K219 Gastro-esophageal reflux disease without esophagitis: Secondary | ICD-10-CM | POA: Diagnosis not present

## 2014-12-09 DIAGNOSIS — E785 Hyperlipidemia, unspecified: Secondary | ICD-10-CM | POA: Diagnosis not present

## 2014-12-09 DIAGNOSIS — E039 Hypothyroidism, unspecified: Secondary | ICD-10-CM | POA: Diagnosis not present

## 2014-12-09 DIAGNOSIS — E669 Obesity, unspecified: Secondary | ICD-10-CM | POA: Insufficient documentation

## 2014-12-09 DIAGNOSIS — I209 Angina pectoris, unspecified: Secondary | ICD-10-CM | POA: Insufficient documentation

## 2014-12-09 NOTE — Progress Notes (Signed)
Patient ID: Penny Reed, female   DOB: 26-Aug-1945, 69 y.o.   MRN: [id number]   DEARA BOBER  MRN: [id number] DOB: Jun 29, 1945  Subjective:  HPI   1. Essential hypertension Patient is a 68 year old female who presents for follow up of her hypertension.  Her last visit was on 03/13/14.  Her blood pressure at that visit was 118/50.  No management changes  were made at that time and she reports good compliance and tolerance of her medications.  She is currently on Quinapril  2. Hyperlipidemia Patient is also here to follow up on her lipids.  She was last seen and had labs done in the fall and is not due any labs at this time.  She reports good compliance and tolerance of her Atorvastatin.  3. Gastroesophageal reflux disease, esophagitis presence not specified Patient continues on her Omeprazole for her acid reflux and states that this is controlling her symptoms well.  She has never tried to come off of the medication and has been on it for several years.  4. Hypothyroidism, unspecified hypothyroidism type Patient is currently on Levothyroxine for her hypothyroidism and states she has no changes in her symptoms.   Patient Active Problem List   Diagnosis Date Noted  . Angina pectoris 12/09/2014  . BP (high blood pressure) 12/09/2014  . Adiposity 12/09/2014  . Dyspnea 09/25/2014  . Abnormal respiratory rate 09/25/2014  . Acquired hypothyroidism 09/21/2014  . Allergic rhinitis 09/21/2014  . Arteriosclerosis of coronary artery 09/21/2014  . Essential (primary) hypertension 09/21/2014  . HLD (hyperlipidemia) 09/21/2014  . Cardiomegaly 09/21/2014  . Familial multiple lipoprotein-type hyperlipidemia 09/21/2014  . CAFL (chronic airflow limitation) 08/06/2013  . Acid reflux 08/06/2013  . Breath shortness 08/06/2013  . Disease of thyroid gland 08/06/2013  . Tobacco abuse 06/21/2013  . COPD, moderate 05/24/2013    Past Medical History  Diagnosis Date  . CAD (coronary artery  disease)   . Hyperlipidemia   . Allergic rhinitis   . Hypothyroidism   . Hypertension   . LVH (left ventricular hypertrophy)     Social History   Social History  . Marital Status: Married    Spouse Name: N/A  . Number of Children: N/A  . Years of Education: N/A   Occupational History  . Not on file.   Social History Main Topics  . Smoking status: Former Smoker -- 0.25 packs/day for 25 years    Types: Cigarettes    Quit date: 04/25/2005  . Smokeless tobacco: Never Used  . Alcohol Use: Yes     Comment: 5 drinks/wk  . Drug Use: No  . Sexual Activity: Not on file   Other Topics Concern  . Not on file   Social History Narrative    Outpatient Prescriptions Prior to Visit  Medication Sig Dispense Refill  . 5-Hydroxytryptophan (5-HTP) 100 MG CAPS Take 1 capsule by mouth at bedtime.    [name] albuterol (PROAIR HFA) 108 (90 BASE) MCG/ACT inhaler Inhale 2 puffs into the lungs every 6 (six) hours as needed for wheezing or shortness of breath. 1 Inhaler 12  . aspirin 81 MG tablet Take 81 mg by mouth daily.    [name] atorvastatin (LIPITOR) 80 MG tablet Take 80 mg by mouth daily.    [name] azelastine (ASTELIN) 137 MCG/SPRAY nasal spray Place 2 sprays into both nostrils 2 (two) times daily. Use in each nostril as directed    . Calcium Carbonate (CALCIUM 600 PO) Take 1 tablet by mouth daily.    [name]  cetirizine (ZYRTEC) 10 MG tablet Take 10 mg by mouth daily as needed.     [name] estradiol (ESTRACE) 0.5 MG tablet Take 0.5 mg by mouth daily.    [name] glucosamine-chondroitin 500-400 MG tablet Take 2 tablets by mouth daily.    [name] levothyroxine (SYNTHROID, LEVOTHROID) 125 MCG tablet Take 125 mcg by mouth daily before breakfast.    . Multiple Vitamin (MULTIVITAMIN) tablet Take 1 tablet by mouth daily.    . Omega-3 Fatty Acids (FISH OIL) 1000 MG CAPS Take 4 capsules by mouth daily.    [name] omeprazole (PRILOSEC) 20 MG capsule     . quinapril (ACCUPRIL) 20 MG tablet Take 20 mg by mouth daily.    [name] tiotropium (SPIRIVA  HANDIHALER) 18 MCG inhalation capsule Place 1 capsule (18 mcg total) into inhaler and inhale daily. 30 capsule 5   No facility-administered medications prior to visit.    Allergies  Allergen Reactions  . Adhesive [Tape]     Band-aids irritate skin    Review of Systems  Constitutional: Negative.   Respiratory: Negative.   Cardiovascular: Negative.   Neurological: Negative for dizziness and headaches.  Endo/Heme/Allergies: Negative for polydipsia.   Objective:  BP 148/64 mmHg  Pulse 60  Temp(Src) 98.2 F (36.8 C) (Oral)  Resp 16  Ht 5\' 7"  (1.702 m)  Wt 189 lb (85.73 kg)  BMI 29.59 kg/m2  Physical Exam  Constitutional: She is well-developed, well-nourished, and in no distress.  Neck: Normal range of motion. Neck supple.  Cardiovascular: Normal rate, regular rhythm and normal heart sounds.   Pulmonary/Chest: Effort normal and breath sounds normal.  Musculoskeletal: She exhibits no edema.  Skin: Skin is warm and dry.  Psychiatric: Mood, memory, affect and judgment normal.    Assessment and Plan :   1. Essential hypertension   2. Hyperlipidemia   3. Gastroesophageal reflux disease, esophagitis presence not specified Continue treatment and will discuss on next visit. She and her husband usually very late at night and she is worried about breakthrough reflux/chest pain with or history of heart disease.  4. Hypothyroidism, unspecified hypothyroidism type  5.CAD All risk factors treated. Patient exercises 4-6 days per week. I have praised her for her good habits. I have done the exam and reviewed the above chart and it is accurate to the best of my knowledge.  Follow up in 6 months for wellness exam  Julieanne Manson MD [facility] 12/09/2014 8:31 AM

## 2014-12-11 ENCOUNTER — Other Ambulatory Visit: Payer: Self-pay

## 2014-12-11 MED ORDER — QUINAPRIL HCL 20 MG PO TABS: 20.0000 mg | ORAL_TABLET | Freq: Every day | ORAL | Status: DC

## 2014-12-18 ENCOUNTER — Other Ambulatory Visit: Payer: Self-pay

## 2014-12-18 MED ORDER — ESTRADIOL 0.5 MG PO TABS: 0.5000 mg | ORAL_TABLET | Freq: Every day | ORAL | Status: DC

## 2014-12-18 MED ORDER — ATORVASTATIN CALCIUM 80 MG PO TABS: 80.0000 mg | ORAL_TABLET | Freq: Every day | ORAL | Status: DC

## 2014-12-18 MED ORDER — TIOTROPIUM BROMIDE MONOHYDRATE 18 MCG IN CAPS: 18.0000 ug | ORAL_CAPSULE | Freq: Every day | RESPIRATORY_TRACT | Status: DC

## 2014-12-18 MED ORDER — QUINAPRIL HCL 20 MG PO TABS: 20.0000 mg | ORAL_TABLET | Freq: Every day | ORAL | Status: DC

## 2014-12-18 MED ORDER — LEVOTHYROXINE SODIUM 125 MCG PO TABS: 125.0000 ug | ORAL_TABLET | Freq: Every day | ORAL | Status: DC

## 2014-12-18 MED ORDER — OMEPRAZOLE 20 MG PO CPDR: 20.0000 mg | DELAYED_RELEASE_CAPSULE | Freq: Every day | ORAL | Status: DC

## 2014-12-18 MED ORDER — AZELASTINE HCL 0.1 % NA SOLN: 2.0000 | Freq: Two times a day (BID) | NASAL | Status: DC

## 2014-12-18 MED ORDER — ALBUTEROL SULFATE HFA 108 (90 BASE) MCG/ACT IN AERS: 2.0000 | INHALATION_SPRAY | Freq: Four times a day (QID) | RESPIRATORY_TRACT | Status: AC | PRN

## 2015-01-15 ENCOUNTER — Other Ambulatory Visit: Payer: Self-pay

## 2015-04-13 ENCOUNTER — Ambulatory Visit (INDEPENDENT_AMBULATORY_CARE_PROVIDER_SITE_OTHER): Payer: Medicare Other | Admitting: Family Medicine

## 2015-04-13 ENCOUNTER — Encounter: Payer: Self-pay | Admitting: Family Medicine

## 2015-04-13 VITALS — BP 152/60 | HR 54 | Temp 97.8°F | Resp 12 | Wt 188.0 lb

## 2015-04-13 DIAGNOSIS — J40 Bronchitis, not specified as acute or chronic: Secondary | ICD-10-CM | POA: Diagnosis not present

## 2015-04-13 MED ORDER — AZITHROMYCIN 250 MG PO TABS: ORAL_TABLET | ORAL | Status: DC

## 2015-04-13 NOTE — Progress Notes (Signed)
Patient ID: Penny Reed, female   DOB: 05-24-1945, 69 y.o.   MRN: [id number]    Subjective:  HPI  Patient has been sick a little over a week ago. Symptoms started with face ache/pressure, scratchy throat, fatigue, some earache, chills, runny nose. She has been using Mucinex DM and Alkel setzer. She is coughing and it is productive and hears some wheezing at bedtime. SOB about the same as usual.  Prior to Admission medications   Medication Sig Start Date End Date Taking? Authorizing Provider  5-Hydroxytryptophan (5-HTP) 100 MG CAPS Take 1 capsule by mouth at bedtime.   Yes Historical Provider, MD  albuterol (PROAIR HFA) 108 (90 BASE) MCG/ACT inhaler Inhale 2 puffs into the lungs every 6 (six) hours as needed for wheezing or shortness of breath. 12/18/14  Yes Maple Hudson., MD  aspirin 81 MG tablet Take 81 mg by mouth daily.   Yes Historical Provider, MD  atorvastatin (LIPITOR) 80 MG tablet Take 1 tablet (80 mg total) by mouth daily. 12/18/14  Yes Richard Hulen Shouts., MD  azelastine (ASTELIN) 0.1 % nasal spray Place 2 sprays into both nostrils 2 (two) times daily. Use in each nostril as directed 12/18/14  Yes Richard Hulen Shouts., MD  Calcium Carbonate (CALCIUM 600 PO) Take 1 tablet by mouth daily.   Yes Historical Provider, MD  cetirizine (ZYRTEC) 10 MG tablet Take 10 mg by mouth daily as needed.    Yes Historical Provider, MD  estradiol (ESTRACE) 0.5 MG tablet Take 1 tablet (0.5 mg total) by mouth daily. 12/18/14  Yes Richard Hulen Shouts., MD  glucosamine-chondroitin 500-400 MG tablet Take 2 tablets by mouth daily.   Yes Historical Provider, MD  levothyroxine (SYNTHROID, LEVOTHROID) 125 MCG tablet Take 1 tablet (125 mcg total) by mouth daily before breakfast. 12/18/14  Yes Maple Hudson., MD  Multiple Vitamin (MULTIVITAMIN) tablet Take 1 tablet by mouth daily.   Yes Historical Provider, MD  Omega-3 Fatty Acids (FISH OIL) 1000 MG CAPS Take 4 capsules by mouth daily.   Yes  Historical Provider, MD  omeprazole (PRILOSEC) 20 MG capsule Take 1 capsule (20 mg total) by mouth daily. 12/18/14  Yes Richard Hulen Shouts., MD  quinapril (ACCUPRIL) 20 MG tablet Take 1 tablet (20 mg total) by mouth daily. 12/18/14  Yes Richard Hulen Shouts., MD  tiotropium (SPIRIVA HANDIHALER) 18 MCG inhalation capsule Place 1 capsule (18 mcg total) into inhaler and inhale daily. 12/18/14 12/18/15 Yes Richard Hulen Shouts., MD    Patient Active Problem List   Diagnosis Date Noted  . Angina pectoris (HCC) 12/09/2014  . BP (high blood pressure) 12/09/2014  . Adiposity 12/09/2014  . Dyspnea 09/25/2014  . Abnormal respiratory rate 09/25/2014  . Acquired hypothyroidism 09/21/2014  . Allergic rhinitis 09/21/2014  . Arteriosclerosis of coronary artery 09/21/2014  . Essential (primary) hypertension 09/21/2014  . HLD (hyperlipidemia) 09/21/2014  . Cardiomegaly 09/21/2014  . Familial multiple lipoprotein-type hyperlipidemia 09/21/2014  . CAFL (chronic airflow limitation) (HCC) 08/06/2013  . Acid reflux 08/06/2013  . Breath shortness 08/06/2013  . Disease of thyroid gland 08/06/2013  . Tobacco abuse 06/21/2013  . COPD, moderate (HCC) 05/24/2013    Past Medical History  Diagnosis Date  . CAD (coronary artery disease)   . Hyperlipidemia   . Allergic rhinitis   . Hypothyroidism   . Hypertension   . LVH (left ventricular hypertrophy)     Social History   Social History  . Marital Status: Married  Spouse Name: N/A  . Number of Children: N/A  . Years of Education: N/A   Occupational History  . Not on file.   Social History Main Topics  . Smoking status: Former Smoker -- 0.25 packs/day for 25 years    Types: Cigarettes    Quit date: 04/25/2005  . Smokeless tobacco: Never Used  . Alcohol Use: Yes     Comment: 5 drinks/wk  . Drug Use: No  . Sexual Activity: Not on file   Other Topics Concern  . Not on file   Social History Narrative    Allergies  Allergen Reactions  .  Adhesive [Tape]     Band-aids irritate skin    Review of Systems  Constitutional: Positive for chills and malaise/fatigue. Negative for fever.  HENT: Positive for congestion.   Respiratory: Positive for cough, shortness of breath and wheezing.   Cardiovascular: Negative.   Musculoskeletal: Negative.   Psychiatric/Behavioral: Negative.     Immunization History  Administered Date(s) Administered  . Influenza Split 03/24/2013  . Influenza-Unspecified 12/24/2013, 12/24/2013  . Pneumococcal Conjugate-13 03/20/2013  . Pneumococcal Polysaccharide-23 04/02/2014  . Pneumococcal-Unspecified 03/24/2013  . Td 07/10/2003   Objective:  BP 152/60 mmHg  Pulse 54  Temp(Src) 97.8 F (36.6 C)  Resp 12  Wt 188 lb (85.276 kg)  SpO2 98%  Physical Exam  Constitutional: She is oriented to person, place, and time and well-developed, well-nourished, and in no distress.  HENT:  Head: Normocephalic and atraumatic.  Right Ear: External ear normal.  Left Ear: External ear normal.  Nose: Nose normal.  Eyes: Conjunctivae are normal.  Neck: Neck supple.  Cardiovascular: Normal rate, regular rhythm and normal heart sounds.   Pulmonary/Chest: Effort normal and breath sounds normal.  Abdominal: Soft.  Neurological: She is alert and oriented to person, place, and time.  Skin: Skin is warm and dry.  Psychiatric: Mood, memory, affect and judgment normal.    Lab Results  Component Value Date   WBC 5.0 03/13/2014   HGB 13.6 03/13/2014   HCT 39 03/13/2014   PLT 203 03/13/2014   CHOL 157 03/13/2014   TRIG 70 03/13/2014   HDL 68 03/13/2014   LDLCALC 75 03/13/2014   TSH 3.29 03/13/2014    CMP     Component Value Date/Time   NA 142 03/13/2014   K 4.6 03/13/2014   BUN 13 03/13/2014   CREATININE 0.8 03/13/2014   AST 43* 03/13/2014   ALT 31 03/13/2014   ALKPHOS 68 03/13/2014    Assessment and Plan :  1. Bronchitis  - azithromycin (ZITHROMAX) 250 MG tablet; As directed  Dispense: 6 tablet;  Refill: 0 2. CAD All risk factors treated.   I have done the exam and reviewed the above chart and it is accurate to the best of my knowledge.   Julieanne Mansonichard Gilbert MD [facility] 04/13/2015 2:35 PM

## 2015-05-14 ENCOUNTER — Encounter: Payer: Self-pay | Admitting: Emergency Medicine

## 2015-05-15 DIAGNOSIS — K219 Gastro-esophageal reflux disease without esophagitis: Secondary | ICD-10-CM | POA: Diagnosis not present

## 2015-05-15 DIAGNOSIS — I25118 Atherosclerotic heart disease of native coronary artery with other forms of angina pectoris: Secondary | ICD-10-CM | POA: Diagnosis not present

## 2015-05-15 DIAGNOSIS — J449 Chronic obstructive pulmonary disease, unspecified: Secondary | ICD-10-CM | POA: Diagnosis not present

## 2015-05-15 DIAGNOSIS — I209 Angina pectoris, unspecified: Secondary | ICD-10-CM | POA: Diagnosis not present

## 2015-06-15 ENCOUNTER — Ambulatory Visit (INDEPENDENT_AMBULATORY_CARE_PROVIDER_SITE_OTHER): Payer: Medicare Other | Admitting: Family Medicine

## 2015-06-15 ENCOUNTER — Encounter: Payer: Self-pay | Admitting: Family Medicine

## 2015-06-15 VITALS — BP 158/60 | HR 52 | Temp 97.8°F | Resp 16 | Ht 67.0 in | Wt 179.0 lb

## 2015-06-15 DIAGNOSIS — Z79890 Hormone replacement therapy (postmenopausal): Secondary | ICD-10-CM

## 2015-06-15 DIAGNOSIS — Z0000 Encounter for general adult medical examination without abnormal findings: Secondary | ICD-10-CM | POA: Diagnosis not present

## 2015-06-15 NOTE — Progress Notes (Signed)
Patient ID: Penny Reed, female   DOB: 1945-05-01, 70 y.o.   MRN: [id number] Visit Date: 06/15/2015  Today's Provider: [name], MD   Chief Complaint  Patient presents with  . Medicare Wellness   Subjective:   Penny Reed is a 69 y.o. female who presents today for her Subsequent Annual Wellness Visit. She feels well. She reports exercising at least 5 days a week. She reports she is sleeping fairly well.  Mammogram- 10/11/13 normal Pap- 04/02/14 normal, HPV neg EKG- 12/01/10 BMD- 12/01/10 normal Colonoscopy- 11/20/09 diverticulosis  Pt would like to discuss coming off of HRT.  Immunization History  Administered Date(s) Administered  . Influenza Split 03/24/2013  . Influenza-Unspecified 12/24/2013, 12/24/2013, 01/24/2015  . Pneumococcal Conjugate-13 03/20/2013  . Pneumococcal Polysaccharide-23 04/02/2014  . Pneumococcal-Unspecified 03/24/2013  . Td 07/10/2003      Review of Systems  Constitutional: Negative.   HENT: Negative.   Eyes: Negative.   Respiratory: Positive for cough.   Cardiovascular: Positive for palpitations (nothing new to pt).  Gastrointestinal: Negative.   Endocrine: Negative.   Genitourinary: Negative.   Musculoskeletal: Positive for arthralgias, neck pain and neck stiffness.  Skin: Negative.   Allergic/Immunologic: Negative.   Neurological: Negative.   Hematological: Negative.   Psychiatric/Behavioral: Negative.     Patient Active Problem List   Diagnosis Date Noted  . Angina pectoris (HCC) 12/09/2014  . BP (high blood pressure) 12/09/2014  . Adiposity 12/09/2014  . Dyspnea 09/25/2014  . Abnormal respiratory rate 09/25/2014  . Acquired hypothyroidism 09/21/2014  . Allergic rhinitis 09/21/2014  . Arteriosclerosis of coronary artery 09/21/2014  . Essential (primary) hypertension 09/21/2014  . HLD (hyperlipidemia) 09/21/2014  . Cardiomegaly 09/21/2014  . Familial multiple lipoprotein-type hyperlipidemia 09/21/2014  . CAFL (chronic  airflow limitation) (HCC) 08/06/2013  . Acid reflux 08/06/2013  . Breath shortness 08/06/2013  . Disease of thyroid gland 08/06/2013  . Tobacco abuse 06/21/2013  . COPD, moderate (HCC) 05/24/2013    Social History   Social History  . Marital Status: Married    Spouse Name: N/A  . Number of Children: N/A  . Years of Education: N/A   Occupational History  . Not on file.   Social History Main Topics  . Smoking status: Former Smoker -- 0.25 packs/day for 25 years    Types: Cigarettes    Quit date: 04/25/2005  . Smokeless tobacco: Never Used  . Alcohol Use: Yes     Comment: 5 drinks/wk  . Drug Use: No  . Sexual Activity: Not on file   Other Topics Concern  . Not on file   Social History Narrative    Past Surgical History  Procedure Laterality Date  . Tonsillectomy    . Appendectomy    . Cardiac catheterization  09/2005    had 1 stent and 3 balloons placed  . Abdominal hysterectomy      has left ovary  . Coronary artery bypass graft      Her family history includes Asthma in her sister; Cancer - Colon in her paternal grandmother; Coronary artery disease in her father; Diabetes in her mother; Heart attack in her brother and father; Hyperlipidemia in her sister; Hypertension in her sister; Scleroderma in her sister; Thyroid disease in her sister.    Outpatient Prescriptions Prior to Visit  Medication Sig Dispense Refill  . 5-Hydroxytryptophan (5-HTP) 100 MG CAPS Take 1 capsule by mouth at bedtime.    [name] albuterol (PROAIR HFA) 108 (90 BASE) MCG/ACT inhaler Inhale 2 puffs into  the lungs every 6 (six) hours as needed for wheezing or shortness of breath. 3 Inhaler 3  . aspirin 81 MG tablet Take 81 mg by mouth daily.    [name] atorvastatin (LIPITOR) 80 MG tablet Take 1 tablet (80 mg total) by mouth daily. 90 tablet 3  . azelastine (ASTELIN) 0.1 % nasal spray Place 2 sprays into both nostrils 2 (two) times daily. Use in each nostril as directed 90 mL 3  . azithromycin (ZITHROMAX)  250 MG tablet As directed 6 tablet 0  . Calcium Carbonate (CALCIUM 600 PO) Take 1 tablet by mouth daily.    . cetirizine (ZYRTEC) 10 MG tablet Take 10 mg by mouth daily as needed.     [name] estradiol (ESTRACE) 0.5 MG tablet Take 1 tablet (0.5 mg total) by mouth daily. 90 tablet 3  . glucosamine-chondroitin 500-400 MG tablet Take 2 tablets by mouth daily.    [name] levothyroxine (SYNTHROID, LEVOTHROID) 125 MCG tablet Take 1 tablet (125 mcg total) by mouth daily before breakfast. 90 tablet 3  . Multiple Vitamin (MULTIVITAMIN) tablet Take 1 tablet by mouth daily.    . Omega-3 Fatty Acids (FISH OIL) 1000 MG CAPS Take 4 capsules by mouth daily.    [name] omeprazole (PRILOSEC) 20 MG capsule Take 1 capsule (20 mg total) by mouth daily. 90 capsule 3  . quinapril (ACCUPRIL) 20 MG tablet Take 1 tablet (20 mg total) by mouth daily. 90 tablet 3  . tiotropium (SPIRIVA HANDIHALER) 18 MCG inhalation capsule Place 1 capsule (18 mcg total) into inhaler and inhale daily. 90 capsule 3   No facility-administered medications prior to visit.    Allergies  Allergen Reactions  . Adhesive [Tape]     Band-aids irritate skin    Patient Care Team: Maple Hudson., MD as PCP - General (Family Medicine)  Objective:   Vitals: There were no vitals filed for this visit.  Physical Exam  Constitutional: She is oriented to person, place, and time. She appears well-developed and well-nourished.  HENT:  Head: Normocephalic and atraumatic.  Right Ear: External ear normal.  Left Ear: External ear normal.  Nose: Nose normal.  Mouth/Throat: Oropharynx is clear and moist.  Eyes: Conjunctivae and EOM are normal. Pupils are equal, round, and reactive to light.  Neck: Normal range of motion. Neck supple.  Cardiovascular: Normal rate, regular rhythm, normal heart sounds and intact distal pulses.   Pulmonary/Chest: Effort normal and breath sounds normal.  Abdominal: Soft. Bowel sounds are normal.  Musculoskeletal: Normal range of  motion.  Neurological: She is alert and oriented to person, place, and time. She has normal reflexes.  Skin: Skin is warm and dry.  Psychiatric: She has a normal mood and affect. Her behavior is normal. Judgment and thought content normal.    Activities of Daily Living In your present state of health, do you have any difficulty performing the following activities: 06/15/2015  Hearing? N  Vision? N  Difficulty concentrating or making decisions? N  Walking or climbing stairs? N  Dressing or bathing? N  Doing errands, shopping? N    Fall Risk Assessment Fall Risk  06/15/2015  Falls in the past year? No     Depression Screen PHQ 2/9 Scores 06/15/2015  PHQ - 2 Score 0    Cognitive Testing - 6-CIT    Year: 0 points  Month: 0 points  Memorize "Floyde Parkins, [address] [place]"  Time (within 1 hour:) 0 points  Count backwards from 20: 0 points  Name months of year: 0 points  Repeat Address: 2 points   Total Score: 2/28  Interpretation : Normal (0-7) Abnormal (8-28)    Assessment & Plan:     Annual Wellness Visit  Reviewed patient's Family Medical History Reviewed and updated list of patient's medical providers Assessment of cognitive impairment was done Assessed patient's functional ability Established a written schedule for health screening services Health Risk Assessent Completed and Reviewed  Exercise Activities and Dietary recommendations Goals    None      Immunization History  Administered Date(s) Administered  . Influenza Split 03/24/2013  . Influenza-Unspecified 12/24/2013, 12/24/2013, 01/24/2015  . Pneumococcal Conjugate-13 03/20/2013  . Pneumococcal Polysaccharide-23 04/02/2014  . Pneumococcal-Unspecified 03/24/2013  . Td 07/10/2003    Health Maintenance  Topic Date Due  . Hepatitis C Screening  03-11-1946  . ZOSTAVAX  07/23/2007  . TETANUS/TDAP  07/09/2013  . MAMMOGRAM  10/12/2015  . INFLUENZA VACCINE  11/24/2015  . COLONOSCOPY  11/21/2019   . DEXA SCAN  Completed  . PNA vac Low Risk Adult  Completed      Discussed health benefits of physical activity, and encouraged her to engage in regular exercise appropriate for her age and condition.   1. Hormone replacement therapy Stop Estrogen. Follow up in 2-4 months.  I have done the exam and reviewed the above chart and it is accurate to the best of my knowledge.  Julieanne Manson MD [facility] 06/15/2015 9:04 AM  ------------------------------------------------------------------------------------------------------------

## 2015-06-22 ENCOUNTER — Other Ambulatory Visit: Payer: Self-pay | Admitting: Family Medicine

## 2015-06-22 DIAGNOSIS — Z1231 Encounter for screening mammogram for malignant neoplasm of breast: Secondary | ICD-10-CM

## 2015-07-03 ENCOUNTER — Other Ambulatory Visit: Payer: Self-pay | Admitting: Family Medicine

## 2015-07-03 ENCOUNTER — Ambulatory Visit
Admission: RE | Admit: 2015-07-03 | Discharge: 2015-07-03 | Disposition: A | Discharge status: Home / Self Care | Payer: Medicare Other | Source: Ambulatory Visit | Attending: Family Medicine | Admitting: Family Medicine

## 2015-07-03 DIAGNOSIS — Z1231 Encounter for screening mammogram for malignant neoplasm of breast: Secondary | ICD-10-CM | POA: Diagnosis not present

## 2015-09-02 ENCOUNTER — Ambulatory Visit: Payer: Medicare Other | Admitting: Family Medicine

## 2015-09-15 ENCOUNTER — Other Ambulatory Visit: Payer: Self-pay | Admitting: Family Medicine

## 2015-09-30 ENCOUNTER — Ambulatory Visit (INDEPENDENT_AMBULATORY_CARE_PROVIDER_SITE_OTHER): Payer: Medicare Other | Admitting: Family Medicine

## 2015-09-30 ENCOUNTER — Telehealth: Payer: Self-pay

## 2015-09-30 ENCOUNTER — Encounter: Payer: Self-pay | Admitting: Family Medicine

## 2015-09-30 VITALS — BP 128/68 | HR 56 | Temp 98.1°F | Resp 16 | Wt 178.0 lb

## 2015-09-30 DIAGNOSIS — R5383 Other fatigue: Secondary | ICD-10-CM

## 2015-09-30 DIAGNOSIS — E785 Hyperlipidemia, unspecified: Secondary | ICD-10-CM | POA: Diagnosis not present

## 2015-09-30 DIAGNOSIS — E039 Hypothyroidism, unspecified: Secondary | ICD-10-CM

## 2015-09-30 DIAGNOSIS — K219 Gastro-esophageal reflux disease without esophagitis: Secondary | ICD-10-CM | POA: Diagnosis not present

## 2015-09-30 DIAGNOSIS — Z79890 Hormone replacement therapy (postmenopausal): Secondary | ICD-10-CM | POA: Diagnosis not present

## 2015-09-30 NOTE — Telephone Encounter (Signed)
Dr. Reece AgarG, I just realized that I did not send in patient's Ambien. Did you want to send in 5mg  or 10mg ? Please advise. I'm sorry I forgot. Thanks!

## 2015-09-30 NOTE — Progress Notes (Signed)
Patient ID: Penny Reed, female   DOB: [birth year], 70 y.o.   MRN: [id number]       Patient: Penny Reed Female    DOB: 03-14-1946   70 y.o.   MRN: [id number] Visit Date: 09/30/2015  Today's Provider: [name], MD   Chief Complaint  Patient presents with  . Hormone Replacement Therapy    3 month FU.   [name] Gastroesophageal Reflux  . Insomnia   Subjective:    HPI Hormone Replacement Therapy Patient is here for a follow up. She was last seen on 06/15/2015 and estrogen was discontinued. Patient reports that she has been tolerating med change well.   GERD, Follow up: Patient reports that she has not taken Omeprazole since LOV. She reports that she has not had any breakthrough symptoms since then.   Insomnia: Patient reports that she has taken Ambien before in the past, and feels like she needs to start back. She reports that she is having difficulty staying asleep (waking up every 1-3 hours). She reports that she has "bad nights" 2-3 days out of the week.      Allergies  Allergen Reactions  . Adhesive [Tape]     Band-aids irritate skin   Previous Medications   5-HYDROXYTRYPTOPHAN (5-HTP) 100 MG CAPS    Take 1 capsule by mouth at bedtime.   ALBUTEROL (PROAIR HFA) 108 (90 BASE) MCG/ACT INHALER    Inhale 2 puffs into the lungs every 6 (six) hours as needed for wheezing or shortness of breath.   ASPIRIN 81 MG TABLET    Take 81 mg by mouth daily.   ATORVASTATIN (LIPITOR) 80 MG TABLET    Take 1 tablet by mouth  daily   AZELASTINE (ASTELIN) 0.1 % NASAL SPRAY    Place 2 sprays into both nostrils 2 (two) times daily. Use in each nostril as directed   CALCIUM CARBONATE (CALCIUM 600 PO)    Take 1 tablet by mouth daily.   CETIRIZINE (ZYRTEC) 10 MG TABLET    Take 10 mg by mouth daily as needed.    ESTRADIOL (ESTRACE) 0.5 MG TABLET    Take 1 tablet (0.5 mg total) by mouth daily.   GLUCOSAMINE-CHONDROITIN 500-400 MG TABLET    Take 2 tablets by mouth daily.   LEVOTHYROXINE  (SYNTHROID, LEVOTHROID) 125 MCG TABLET    Take 1 tablet by mouth  daily before breakfast   MULTIPLE VITAMIN (MULTIVITAMIN) TABLET    Take 1 tablet by mouth daily.   OMEGA-3 FATTY ACIDS (FISH OIL) 1000 MG CAPS    Take 4 capsules by mouth daily.   OMEPRAZOLE (PRILOSEC) 20 MG CAPSULE    Take 1 capsule (20 mg total) by mouth daily.   QUINAPRIL (ACCUPRIL) 20 MG TABLET    Take 1 tablet by mouth  daily   SPIRIVA HANDIHALER 18 MCG INHALATION CAPSULE    Inhale the contents of 1  capsule via HandiHaler  daily    Review of Systems  Constitutional: Positive for fatigue. Negative for fever, chills, diaphoresis, activity change, appetite change and unexpected weight change.  Respiratory: Negative.   Cardiovascular: Negative.   Gastrointestinal: Negative.   Psychiatric/Behavioral: Positive for sleep disturbance.       Insomnia    Social History  Substance Use Topics  . Smoking status: Former Smoker -- 0.25 packs/day for 25 years    Types: Cigarettes    Quit date: 04/25/2005  . Smokeless tobacco: Never Used  . Alcohol Use: Yes     Comment:  3-4 drinks 2-3 times a week.   Objective:   BP 128/68 mmHg  Pulse 56  Temp(Src) 98.1 F (36.7 C)  Resp 16  Wt 178 lb (80.74 kg)  Physical Exam  Constitutional: She is oriented to person, place, and time. She appears well-developed and well-nourished.  HENT:  Head: Normocephalic and atraumatic.  Right Ear: External ear normal.  Left Ear: External ear normal.  Nose: Nose normal.  Eyes: Conjunctivae are normal.  Neck: Neck supple. No thyromegaly present.  Cardiovascular: Normal rate, regular rhythm and normal heart sounds.   Pulmonary/Chest: Effort normal and breath sounds normal.  Abdominal: Soft.  Musculoskeletal: She exhibits no edema.  Neurological: She is alert and oriented to person, place, and time. She has normal reflexes.  Skin: Skin is warm and dry.  Psychiatric: She has a normal mood and affect. Her behavior is normal. Judgment and thought  content normal.        Assessment & Plan:     1. Hormone replacement therapy (HRT)   2. Gastroesophageal reflux disease without esophagitis Stable off of meds.  3. Other fatigue Mild,she feels better when she exercises regularly. - Comprehensive metabolic panel - CK (Creatine Kinase) - CBC with Differential/Platelet  4. HLD (hyperlipidemia)  - Lipid panel  5. Hypothyroidism, unspecified hypothyroidism type  - TSH     I have done the exam and reviewed the above chart and it is accurate to the best of my knowledge.   Mathilde Mcwherter Wendelyn BreslowGilbert Jr, MD  [facility]

## 2015-10-01 ENCOUNTER — Other Ambulatory Visit: Payer: Self-pay

## 2015-10-01 ENCOUNTER — Telehealth: Payer: Self-pay

## 2015-10-01 LAB — CBC WITH DIFFERENTIAL/PLATELET
Basophils Absolute: 0 10*3/uL (ref 0.0–0.2)
Basos: 0 %
EOS (ABSOLUTE): 0.2 10*3/uL (ref 0.0–0.4)
Eos: 3 %
Hematocrit: 40.5 % (ref 34.0–46.6)
Hemoglobin: 13.4 g/dL (ref 11.1–15.9)
IMMATURE GRANULOCYTES: 0 %
Immature Grans (Abs): 0 10*3/uL (ref 0.0–0.1)
LYMPHS: 44 %
Lymphocytes Absolute: 2.5 10*3/uL (ref 0.7–3.1)
MCH: 31.5 pg (ref 26.6–33.0)
MCHC: 33.1 g/dL (ref 31.5–35.7)
MCV: 95 fL (ref 79–97)
MONOS ABS: 0.5 10*3/uL (ref 0.1–0.9)
Monocytes: 9 %
NEUTROS PCT: 44 %
Neutrophils Absolute: 2.5 10*3/uL (ref 1.4–7.0)
PLATELETS: 216 10*3/uL (ref 150–379)
RBC: 4.25 x10E6/uL (ref 3.77–5.28)
RDW: 13.1 % (ref 12.3–15.4)
WBC: 5.7 10*3/uL (ref 3.4–10.8)

## 2015-10-01 LAB — COMPREHENSIVE METABOLIC PANEL
A/G RATIO: 1.6 (ref 1.2–2.2)
ALT: 22 IU/L (ref 0–32)
AST: 24 IU/L (ref 0–40)
Albumin: 4.4 g/dL (ref 3.6–4.8)
Alkaline Phosphatase: 69 IU/L (ref 39–117)
BILIRUBIN TOTAL: 0.8 mg/dL (ref 0.0–1.2)
BUN/Creatinine Ratio: 24 (ref 12–28)
BUN: 17 mg/dL (ref 8–27)
CALCIUM: 10.2 mg/dL (ref 8.7–10.3)
CHLORIDE: 102 mmol/L (ref 96–106)
CO2: 24 mmol/L (ref 18–29)
Creatinine, Ser: 0.71 mg/dL (ref 0.57–1.00)
GFR calc Af Amer: 101 mL/min/{1.73_m2} (ref >59)
GFR, EST NON AFRICAN AMERICAN: 88 mL/min/{1.73_m2} (ref >59)
GLOBULIN, TOTAL: 2.8 g/dL (ref 1.5–4.5)
Glucose: 103 mg/dL — ABNORMAL HIGH (ref 65–99)
POTASSIUM: 5.2 mmol/L (ref 3.5–5.2)
SODIUM: 143 mmol/L (ref 134–144)
Total Protein: 7.2 g/dL (ref 6.0–8.5)

## 2015-10-01 LAB — LIPID PANEL
CHOL/HDL RATIO: 2.3 ratio (ref 0.0–4.4)
Cholesterol, Total: 164 mg/dL (ref 100–199)
HDL: 72 mg/dL (ref >39)
LDL Calculated: 76 mg/dL (ref 0–99)
TRIGLYCERIDES: 80 mg/dL (ref 0–149)
VLDL Cholesterol Cal: 16 mg/dL (ref 5–40)

## 2015-10-01 LAB — CK: Total CK: 95 U/L (ref 24–173)

## 2015-10-01 LAB — TSH: TSH: 1.11 u[IU]/mL (ref 0.450–4.500)

## 2015-10-01 MED ORDER — ZOLPIDEM TARTRATE 5 MG PO TABS: 5.0000 mg | ORAL_TABLET | Freq: Every evening | ORAL | Status: DC | PRN

## 2015-10-01 NOTE — Telephone Encounter (Signed)
5 mg thanks

## 2015-10-01 NOTE — Telephone Encounter (Signed)
Advised  ED 

## 2015-10-01 NOTE — Telephone Encounter (Signed)
LMTCB ED 

## 2015-10-01 NOTE — Telephone Encounter (Signed)
-----   Message from Maple Hudsonichard L Gilbert Jr., MD sent at 10/01/2015  8:13 AM EDT ----- Labs okay.

## 2015-11-20 DIAGNOSIS — R001 Bradycardia, unspecified: Secondary | ICD-10-CM | POA: Diagnosis not present

## 2015-11-20 DIAGNOSIS — J449 Chronic obstructive pulmonary disease, unspecified: Secondary | ICD-10-CM | POA: Diagnosis not present

## 2015-11-20 DIAGNOSIS — I209 Angina pectoris, unspecified: Secondary | ICD-10-CM | POA: Diagnosis not present

## 2015-11-20 DIAGNOSIS — I25118 Atherosclerotic heart disease of native coronary artery with other forms of angina pectoris: Secondary | ICD-10-CM | POA: Diagnosis not present

## 2016-05-23 DIAGNOSIS — R001 Bradycardia, unspecified: Secondary | ICD-10-CM | POA: Diagnosis not present

## 2016-05-23 DIAGNOSIS — I209 Angina pectoris, unspecified: Secondary | ICD-10-CM | POA: Diagnosis not present

## 2016-05-23 DIAGNOSIS — I25118 Atherosclerotic heart disease of native coronary artery with other forms of angina pectoris: Secondary | ICD-10-CM | POA: Diagnosis not present

## 2016-05-23 DIAGNOSIS — J449 Chronic obstructive pulmonary disease, unspecified: Secondary | ICD-10-CM | POA: Diagnosis not present

## 2016-06-15 ENCOUNTER — Encounter: Payer: Medicare Other | Admitting: Family Medicine

## 2016-07-18 ENCOUNTER — Other Ambulatory Visit: Payer: Self-pay | Admitting: Family Medicine

## 2016-07-18 DIAGNOSIS — Z1231 Encounter for screening mammogram for malignant neoplasm of breast: Secondary | ICD-10-CM

## 2016-08-10 ENCOUNTER — Encounter: Payer: Medicare Other | Admitting: Family Medicine

## 2016-08-22 ENCOUNTER — Ambulatory Visit
Admission: RE | Admit: 2016-08-22 | Discharge: 2016-08-22 | Disposition: A | Discharge status: Home / Self Care | Payer: Medicare Other | Source: Ambulatory Visit | Attending: Family Medicine | Admitting: Family Medicine

## 2016-08-22 DIAGNOSIS — Z1231 Encounter for screening mammogram for malignant neoplasm of breast: Secondary | ICD-10-CM | POA: Insufficient documentation

## 2016-08-24 ENCOUNTER — Encounter: Payer: Self-pay | Admitting: Family Medicine

## 2016-08-24 ENCOUNTER — Other Ambulatory Visit: Payer: Self-pay | Admitting: Family Medicine

## 2016-08-24 ENCOUNTER — Ambulatory Visit (INDEPENDENT_AMBULATORY_CARE_PROVIDER_SITE_OTHER): Payer: Medicare Other | Admitting: Family Medicine

## 2016-08-24 VITALS — BP 132/64 | HR 42 | Temp 98.0°F | Resp 16 | Wt 187.0 lb

## 2016-08-24 DIAGNOSIS — K219 Gastro-esophageal reflux disease without esophagitis: Secondary | ICD-10-CM

## 2016-08-24 DIAGNOSIS — R001 Bradycardia, unspecified: Secondary | ICD-10-CM | POA: Diagnosis not present

## 2016-08-24 DIAGNOSIS — I10 Essential (primary) hypertension: Secondary | ICD-10-CM | POA: Diagnosis not present

## 2016-08-24 DIAGNOSIS — E7800 Pure hypercholesterolemia, unspecified: Secondary | ICD-10-CM

## 2016-08-24 DIAGNOSIS — E039 Hypothyroidism, unspecified: Secondary | ICD-10-CM

## 2016-08-24 NOTE — Progress Notes (Signed)
Patient: Penny Reed, Female    DOB: Jun 25, 1945, 71 y.o.   MRN: [id number] Visit Date: 08/24/2016  Today's Provider: [name], MD   Chief Complaint  Patient presents with  . Annual Wellness  . Cough    for about 1 month   Subjective:    Annual wellness visit Penny Reed is a 71 y.o. female. She feels fairly well. She reports exercising 5-6 days weekly. She reports she is sleeping fairly well.  Colonoscopy- 11/20/2009. Diverticulosis. Mammogram- 08/22/2016. Normal. BMD- 12/01/2010. Normal. Pap- 04/02/2014. Normal.   Cough: Patient reports that she has a constant dry cough X 1 month. Patient denies any allergy symptoms and she has not been taking anything for her symptoms.    Review of Systems  Constitutional: Negative.   HENT: Negative.   Eyes: Negative.   Respiratory: Positive for cough. Negative for apnea, choking, chest tightness, shortness of breath, wheezing and stridor.        Predictable dyspnea on exertion. It may have gotten worse over the past year.  Cardiovascular: Negative.   Gastrointestinal: Negative.   Endocrine: Negative.   Genitourinary: Negative.   Musculoskeletal: Negative.   Skin: Negative.   Allergic/Immunologic: Negative.   Neurological: Negative.   Hematological: Negative.   Psychiatric/Behavioral: Negative.     Social History   Social History  . Marital status: Married    Spouse name: N/A  . Number of children: N/A  . Years of education: N/A   Occupational History  . Not on file.   Social History Main Topics  . Smoking status: Former Smoker    Packs/day: 0.25    Years: 25.00    Types: Cigarettes    Quit date: 04/25/2005  . Smokeless tobacco: Never Used  . Alcohol use Yes     Comment: 3-4 drinks 2-3 times a week.  . Drug use: No  . Sexual activity: Not on file   Other Topics Concern  . Not on file   Social History Narrative  . No narrative on file    Past Medical History:  Diagnosis Date  . Allergic  rhinitis   . CAD (coronary artery disease)   . Hyperlipidemia   . Hypertension   . Hypothyroidism   . LVH (left ventricular hypertrophy)      Patient Active Problem List   Diagnosis Date Noted  . Angina pectoris (HCC) 12/09/2014  . BP (high blood pressure) 12/09/2014  . Adiposity 12/09/2014  . Dyspnea 09/25/2014  . Abnormal respiratory rate 09/25/2014  . Acquired hypothyroidism 09/21/2014  . Allergic rhinitis 09/21/2014  . Arteriosclerosis of coronary artery 09/21/2014  . Essential (primary) hypertension 09/21/2014  . HLD (hyperlipidemia) 09/21/2014  . Cardiomegaly 09/21/2014  . Familial multiple lipoprotein-type hyperlipidemia 09/21/2014  . CAFL (chronic airflow limitation) (HCC) 08/06/2013  . Acid reflux 08/06/2013  . Breath shortness 08/06/2013  . Disease of thyroid gland 08/06/2013  . Tobacco abuse 06/21/2013  . COPD, moderate (HCC) 05/24/2013    Past Surgical History:  Procedure Laterality Date  . ABDOMINAL HYSTERECTOMY     has left ovary  . APPENDECTOMY    . CARDIAC CATHETERIZATION  09/2005   had 1 stent and 3 balloons placed  . CORONARY ARTERY BYPASS GRAFT    . TONSILLECTOMY      Her family history includes Asthma in her sister; Breast cancer in her maternal aunt; Breast cancer (age of onset: 49) in her sister; Cancer in her sister; Cancer - Colon in her paternal  grandmother; Coronary artery disease in her father; Diabetes in her mother; Heart attack in her brother and father; Hyperlipidemia in her sister; Hypertension in her sister; Scleroderma in her sister; Stroke in her mother; Thyroid disease in her sister.      Current Outpatient Prescriptions:  .  5-Hydroxytryptophan (5-HTP) 100 MG CAPS, Take 1 capsule by mouth at bedtime., Disp: , Rfl:  .  albuterol (PROAIR HFA) 108 (90 BASE) MCG/ACT inhaler, Inhale 2 puffs into the lungs every 6 (six) hours as needed for wheezing or shortness of breath., Disp: 3 Inhaler, Rfl: 3 .  aspirin 81 MG tablet, Take 81 mg by  mouth daily., Disp: , Rfl:  .  atorvastatin (LIPITOR) 80 MG tablet, Take 1 tablet by mouth  daily, Disp: 90 tablet, Rfl: 3 .  azelastine (ASTELIN) 0.1 % nasal spray, USE 2 SPRAYS IN EACH  NOSTRIL TWICE DAILY AS  DIRECTED, Disp: 120 mL, Rfl: 1`1 .  Calcium Carbonate (CALCIUM 600 PO), Take 1 tablet by mouth daily., Disp: , Rfl:  .  cetirizine (ZYRTEC) 10 MG tablet, Take 10 mg by mouth daily as needed. , Disp: , Rfl:  .  glucosamine-chondroitin 500-400 MG tablet, Take 2 tablets by mouth daily., Disp: , Rfl:  .  levothyroxine (SYNTHROID, LEVOTHROID) 125 MCG tablet, Take 1 tablet by mouth  daily before breakfast, Disp: 90 tablet, Rfl: 3 .  Multiple Vitamin (MULTIVITAMIN) tablet, Take 1 tablet by mouth daily., Disp: , Rfl:  .  Omega-3 Fatty Acids (FISH OIL) 1000 MG CAPS, Take 4 capsules by mouth daily., Disp: , Rfl:  .  quinapril (ACCUPRIL) 20 MG tablet, Take 1 tablet by mouth  daily, Disp: 90 tablet, Rfl: 3 .  SPIRIVA HANDIHALER 18 MCG inhalation capsule, Inhale the contents of 1  capsule via HandiHaler  daily, Disp: 90 capsule, Rfl: 3 .  zolpidem (AMBIEN) 5 MG tablet, Take 1 tablet (5 mg total) by mouth at bedtime as needed for sleep., Disp: 15 tablet, Rfl: 1 .  omeprazole (PRILOSEC) 20 MG capsule, Take 1 capsule (20 mg total) by mouth daily. (Patient not taking: Reported on 09/30/2015), Disp: 90 capsule, Rfl: 3  Patient Care Team: Maple Hudson., MD as PCP - General (Family Medicine)     Objective:   Vitals: BP 132/64 (BP Location: Left Arm, Patient Position: Sitting, Cuff Size: Normal)   Pulse (!) 42   Temp 98 F (36.7 C)   Resp 16   Wt 187 lb (84.8 kg)   SpO2 97%   BMI 29.29 kg/m   Physical Exam  Constitutional: She is oriented to person, place, and time. She appears well-developed and well-nourished.  HENT:  Head: Normocephalic and atraumatic.  Right Ear: External ear normal.  Left Ear: External ear normal.  Nose: Nose normal.  Mouth/Throat: Oropharynx is clear and moist.    Eyes: Conjunctivae are normal. No scleral icterus.  Neck: No thyromegaly present.  Cardiovascular: Normal rate, regular rhythm and normal heart sounds.   Pulmonary/Chest: Effort normal and breath sounds normal.  Abdominal: Soft.  Musculoskeletal: Normal range of motion.  Neurological: She is alert and oriented to person, place, and time. No cranial nerve deficit. She exhibits normal muscle tone. Coordination normal.  Skin: Skin is warm and dry.  Psychiatric: She has a normal mood and affect. Her behavior is normal. Judgment and thought content normal.    Activities of Daily Living In your present state of health, do you have any difficulty performing the following activities: 08/24/2016  Hearing? N  Vision? N  Difficulty concentrating or making decisions? N  Walking or climbing stairs? N  Dressing or bathing? N  Doing errands, shopping? N  Some recent data might be hidden    Fall Risk Assessment Fall Risk  08/24/2016 06/15/2015  Falls in the past year? No No     Depression Screen PHQ 2/9 Scores 08/24/2016 08/24/2016 06/15/2015  PHQ - 2 Score 0 0 0  PHQ- 9 Score 4 - -    Cognitive Testing - 6-CIT  Correct? Score   What year is it? yes 0 0 or 4  What month is it? yes 0 0 or 3  Memorize:    Floyde Parkins,  44,  [address],  [place],      What time is it? (within 1 hour) yes 0 0 or 3  Count backwards from 20 yes 0 0, 2, or 4  Name the months of the year yes 0 0, 2, or 4  Repeat name & address above yes 0 0, 2, 4, 6, 8, or 10       TOTAL SCORE  0/28   Interpretation:  Normal  Normal (0-7) Abnormal (8-28)       Assessment & Plan:     Annual Wellness Visit  Reviewed patient's Family Medical History Reviewed and updated list of patient's medical providers Assessment of cognitive impairment was done Assessed patient's functional ability Established a written schedule for health screening services Health Risk Assessent Completed and Reviewed  Exercise Activities and Dietary  recommendations Goals    None      Immunization History  Administered Date(s) Administered  . Influenza Split 03/24/2013  . Influenza-Unspecified 12/24/2013, 12/24/2013, 01/24/2015  . Pneumococcal Conjugate-13 03/20/2013  . Pneumococcal Polysaccharide-23 04/02/2014  . Pneumococcal-Unspecified 03/24/2013  . Td 07/10/2003    Health Maintenance  Topic Date Due  . Hepatitis C Screening  [birth year]  . TETANUS/TDAP  07/09/2013  . INFLUENZA VACCINE  11/23/2016  . MAMMOGRAM  08/23/2018  . COLONOSCOPY  11/21/2019  . DEXA SCAN  Completed  . PNA vac Low Risk Adult  Completed     Discussed health benefits of physical activity, and encouraged her to engage in regular exercise appropriate for her age and condition.  CAD/status post PCI and stent DOB the be anginal equivalent although patient had jaw pain at the time of her MI. Last EKG I have is 2007 and today's is a little bit different. Would like her to see Dr. Juliann Pares. Hypertension Hyperlipidemia Dry cough Go back on PPI for now. May be Ace cough. Could be allergies. Lungs are clear.   ------------------------------------------------------------------------------------------------------------   I have done the exam and reviewed the above chart and it is accurate to the best of my knowledge. Dentist has been used in this note in any air is in the dictation or transcription are unintentional.  [name], MD  [facility]

## 2016-08-25 ENCOUNTER — Other Ambulatory Visit: Payer: Self-pay

## 2016-08-25 ENCOUNTER — Other Ambulatory Visit: Payer: Self-pay | Admitting: Emergency Medicine

## 2016-08-25 DIAGNOSIS — E039 Hypothyroidism, unspecified: Secondary | ICD-10-CM

## 2016-08-25 LAB — COMPREHENSIVE METABOLIC PANEL
ALK PHOS: 81 IU/L (ref 39–117)
ALT: 30 IU/L (ref 0–32)
AST: 35 IU/L (ref 0–40)
Albumin/Globulin Ratio: 1.8 (ref 1.2–2.2)
Albumin: 4.6 g/dL (ref 3.6–4.8)
BUN/Creatinine Ratio: 17 (ref 12–28)
BUN: 15 mg/dL (ref 8–27)
Bilirubin Total: 0.8 mg/dL (ref 0.0–1.2)
CO2: 24 mmol/L (ref 18–29)
CREATININE: 0.87 mg/dL (ref 0.57–1.00)
Calcium: 10.2 mg/dL (ref 8.7–10.3)
Chloride: 99 mmol/L (ref 96–106)
GFR calc Af Amer: 79 mL/min/{1.73_m2} (ref >59)
GFR calc non Af Amer: 68 mL/min/{1.73_m2} (ref >59)
Globulin, Total: 2.6 g/dL (ref 1.5–4.5)
Glucose: 106 mg/dL — ABNORMAL HIGH (ref 65–99)
Potassium: 4.8 mmol/L (ref 3.5–5.2)
SODIUM: 138 mmol/L (ref 134–144)
Total Protein: 7.2 g/dL (ref 6.0–8.5)

## 2016-08-25 LAB — CBC WITH DIFFERENTIAL/PLATELET
BASOS ABS: 0 10*3/uL (ref 0.0–0.2)
Basos: 0 %
EOS (ABSOLUTE): 0.2 10*3/uL (ref 0.0–0.4)
Eos: 3 %
HEMOGLOBIN: 13.9 g/dL (ref 11.1–15.9)
Hematocrit: 41.3 % (ref 34.0–46.6)
IMMATURE GRANS (ABS): 0 10*3/uL (ref 0.0–0.1)
Immature Granulocytes: 0 %
LYMPHS ABS: 1.8 10*3/uL (ref 0.7–3.1)
LYMPHS: 37 %
MCH: 31.6 pg (ref 26.6–33.0)
MCHC: 33.7 g/dL (ref 31.5–35.7)
MCV: 94 fL (ref 79–97)
MONOCYTES: 7 %
Monocytes Absolute: 0.4 10*3/uL (ref 0.1–0.9)
NEUTROS ABS: 2.6 10*3/uL (ref 1.4–7.0)
Neutrophils: 53 %
PLATELETS: 211 10*3/uL (ref 150–379)
RBC: 4.4 x10E6/uL (ref 3.77–5.28)
RDW: 13.4 % (ref 12.3–15.4)
WBC: 4.9 10*3/uL (ref 3.4–10.8)

## 2016-08-25 LAB — LIPID PANEL
CHOL/HDL RATIO: 2.4 ratio (ref 0.0–4.4)
CHOLESTEROL TOTAL: 180 mg/dL (ref 100–199)
HDL: 76 mg/dL (ref >39)
LDL CALC: 88 mg/dL (ref 0–99)
TRIGLYCERIDES: 80 mg/dL (ref 0–149)
VLDL Cholesterol Cal: 16 mg/dL (ref 5–40)

## 2016-08-25 LAB — TSH: TSH: 5.46 u[IU]/mL — AB (ref 0.450–4.500)

## 2016-08-25 MED ORDER — LEVOTHYROXINE SODIUM 150 MCG PO TABS: 150.0000 ug | ORAL_TABLET | Freq: Every day | ORAL | 3 refills | Status: DC

## 2016-08-25 MED ORDER — AZELASTINE HCL 0.1 % NA SOLN: NASAL | 11 refills | Status: DC

## 2016-08-25 NOTE — Addendum Note (Signed)
Addended by: Latanya Presser'DELL, Gillermo Poch M on: 08/25/2016 10:52 AM   Modules accepted: Orders

## 2016-08-25 NOTE — Addendum Note (Signed)
Addended by: Latanya Presser'DELL, Shabria Egley M on: 08/25/2016 02:23 PM   Modules accepted: Orders

## 2016-08-30 DIAGNOSIS — J449 Chronic obstructive pulmonary disease, unspecified: Secondary | ICD-10-CM | POA: Diagnosis not present

## 2016-08-30 DIAGNOSIS — I208 Other forms of angina pectoris: Secondary | ICD-10-CM | POA: Diagnosis not present

## 2016-08-30 DIAGNOSIS — R001 Bradycardia, unspecified: Secondary | ICD-10-CM | POA: Diagnosis not present

## 2016-08-30 DIAGNOSIS — R0602 Shortness of breath: Secondary | ICD-10-CM | POA: Diagnosis not present

## 2016-08-30 DIAGNOSIS — R011 Cardiac murmur, unspecified: Secondary | ICD-10-CM | POA: Diagnosis not present

## 2016-08-31 ENCOUNTER — Other Ambulatory Visit: Payer: Self-pay | Admitting: Family Medicine

## 2016-08-31 NOTE — Telephone Encounter (Signed)
[facility] faxed a request on the following medication. Thanks CC ° °zolpidem (AMBIEN) 5 MG tablet  ° °

## 2016-09-01 MED ORDER — ZOLPIDEM TARTRATE 5 MG PO TABS: 5.0000 mg | ORAL_TABLET | Freq: Every evening | ORAL | 1 refills | Status: DC | PRN

## 2016-09-05 DIAGNOSIS — H2513 Age-related nuclear cataract, bilateral: Secondary | ICD-10-CM | POA: Diagnosis not present

## 2016-09-07 DIAGNOSIS — M5431 Sciatica, right side: Secondary | ICD-10-CM | POA: Diagnosis not present

## 2016-09-07 DIAGNOSIS — M9901 Segmental and somatic dysfunction of cervical region: Secondary | ICD-10-CM | POA: Diagnosis not present

## 2016-09-07 DIAGNOSIS — M9903 Segmental and somatic dysfunction of lumbar region: Secondary | ICD-10-CM | POA: Diagnosis not present

## 2016-09-07 DIAGNOSIS — M531 Cervicobrachial syndrome: Secondary | ICD-10-CM | POA: Diagnosis not present

## 2016-09-09 ENCOUNTER — Telehealth: Payer: Self-pay

## 2016-09-09 NOTE — Telephone Encounter (Signed)
Attempted to schedule new patient referral from Dr. Juliann Paresallwood  Sob copd  lmov to call office

## 2016-09-20 ENCOUNTER — Ambulatory Visit (INDEPENDENT_AMBULATORY_CARE_PROVIDER_SITE_OTHER): Payer: Medicare Other | Admitting: Family Medicine

## 2016-09-20 VITALS — BP 158/56 | HR 68 | Temp 100.7°F | Resp 16 | Wt 182.0 lb

## 2016-09-20 DIAGNOSIS — R5081 Fever presenting with conditions classified elsewhere: Secondary | ICD-10-CM | POA: Diagnosis not present

## 2016-09-20 DIAGNOSIS — J440 Chronic obstructive pulmonary disease with acute lower respiratory infection: Secondary | ICD-10-CM | POA: Diagnosis not present

## 2016-09-20 DIAGNOSIS — J209 Acute bronchitis, unspecified: Secondary | ICD-10-CM

## 2016-09-20 DIAGNOSIS — J449 Chronic obstructive pulmonary disease, unspecified: Secondary | ICD-10-CM | POA: Diagnosis not present

## 2016-09-20 LAB — POCT INFLUENZA A/B
Influenza A, POC: NEGATIVE
Influenza B, POC: NEGATIVE

## 2016-09-20 MED ORDER — DOXYCYCLINE HYCLATE 100 MG PO TABS: 100.0000 mg | ORAL_TABLET | Freq: Two times a day (BID) | ORAL | 0 refills | Status: DC

## 2016-09-20 NOTE — Progress Notes (Signed)
Penny Reed  MRN: [id number] DOB: 1945-04-30  Subjective:  HPI  Patient is here due to not feeling well. Symptoms started on Sunday may 27th. Symptoms present are some joint discomfort, headache, cough-with slight productivity, it hurts to cough, voice loose, hoarse, sore throat, fatigue. Chronic Shortness of breath due to COPD. She uses Astelin nasal spray, takes Zyrtec as needed, and has taking mucinnex D for current symptoms. Patient does mention that she had a tick bite on mothers day and another one this past Sunday May 27th when she started feeling bad. Patient Active Problem List   Diagnosis Date Noted  . Angina pectoris (HCC) 12/09/2014  . BP (high blood pressure) 12/09/2014  . Adiposity 12/09/2014  . Dyspnea 09/25/2014  . Abnormal respiratory rate 09/25/2014  . Acquired hypothyroidism 09/21/2014  . Allergic rhinitis 09/21/2014  . Arteriosclerosis of coronary artery 09/21/2014  . Essential (primary) hypertension 09/21/2014  . HLD (hyperlipidemia) 09/21/2014  . Cardiomegaly 09/21/2014  . Familial multiple lipoprotein-type hyperlipidemia 09/21/2014  . CAFL (chronic airflow limitation) (HCC) 08/06/2013  . Acid reflux 08/06/2013  . Breath shortness 08/06/2013  . Disease of thyroid gland 08/06/2013  . Tobacco abuse 06/21/2013  . COPD, moderate (HCC) 05/24/2013    Past Medical History:  Diagnosis Date  . Allergic rhinitis   . CAD (coronary artery disease)   . Hyperlipidemia   . Hypertension   . Hypothyroidism   . LVH (left ventricular hypertrophy)     Social History   Social History  . Marital status: Married    Spouse name: N/A  . Number of children: N/A  . Years of education: N/A   Occupational History  . Not on file.   Social History Main Topics  . Smoking status: Former Smoker    Packs/day: 0.25    Years: 25.00    Types: Cigarettes    Quit date: 04/25/2005  . Smokeless tobacco: Never Used  . Alcohol use Yes     Comment: 3-4 drinks 2-3 times a  week.  . Drug use: No  . Sexual activity: Not on file   Other Topics Concern  . Not on file   Social History Narrative  . No narrative on file    Outpatient Encounter Prescriptions as of 09/20/2016  Medication Sig  . 5-Hydroxytryptophan (5-HTP) 100 MG CAPS Take 1 capsule by mouth at bedtime.  [name] albuterol (PROAIR HFA) 108 (90 BASE) MCG/ACT inhaler Inhale 2 puffs into the lungs every 6 (six) hours as needed for wheezing or shortness of breath.  [name] aspirin 81 MG tablet Take 81 mg by mouth daily.  [name] atorvastatin (LIPITOR) 80 MG tablet Take 1 tablet by mouth  daily  . azelastine (ASTELIN) 0.1 % nasal spray USE 2 SPRAYS IN EACH  NOSTRIL TWICE DAILY AS  DIRECTED  . Calcium Carbonate (CALCIUM 600 PO) Take 1 tablet by mouth daily.  . cetirizine (ZYRTEC) 10 MG tablet Take 10 mg by mouth daily as needed.   [name] glucosamine-chondroitin 500-400 MG tablet Take 2 tablets by mouth daily.  [name] levothyroxine (SYNTHROID, LEVOTHROID) 150 MCG tablet Take 1 tablet (150 mcg total) by mouth daily before breakfast.  . Multiple Vitamin (MULTIVITAMIN) tablet Take 1 tablet by mouth daily.  . Omega-3 Fatty Acids (FISH OIL) 1000 MG CAPS Take 4 capsules by mouth daily.  [name] omeprazole (PRILOSEC) 20 MG capsule Take 1 capsule (20 mg total) by mouth daily.  . quinapril (ACCUPRIL) 20 MG tablet Take 1 tablet by mouth  daily  . SPIRIVA HANDIHALER  18 MCG inhalation capsule Inhale the contents of 1  capsule via HandiHaler  daily  . zolpidem (AMBIEN) 5 MG tablet Take 1 tablet (5 mg total) by mouth at bedtime as needed for sleep.   No facility-administered encounter medications on file as of 09/20/2016.     Allergies  Allergen Reactions  . Adhesive [Tape]     Band-aids irritate skin    Review of Systems  Constitutional: Positive for fever and malaise/fatigue.  HENT: Positive for sore throat.   Eyes: Negative.   Respiratory: Positive for cough, sputum production and shortness of breath.   Cardiovascular: Positive for chest  pain (from cough).  Gastrointestinal: Negative.   Musculoskeletal: Positive for joint pain and myalgias.  Neurological: Positive for headaches.  Endo/Heme/Allergies: Negative.   Psychiatric/Behavioral: Negative.     Objective:  BP (!) 158/56   Pulse 68   Temp (!) 100.7 F (38.2 C)   Resp 16   Wt 182 lb (82.6 kg)   SpO2 97%   BMI 28.51 kg/m   Physical Exam  Constitutional: She is oriented to person, place, and time and well-developed, well-nourished, and in no distress.  HENT:  Head: Normocephalic and atraumatic.  Right Ear: External ear normal.  Left Ear: External ear normal.  Nose: Nose normal.  Mouth/Throat: Oropharynx is clear and moist.  Eyes: Conjunctivae are normal. No scleral icterus.  Neck: Neck supple. No thyromegaly present.  Cardiovascular: Normal rate, regular rhythm and normal heart sounds.   Pulmonary/Chest: Effort normal and breath sounds normal.  Abdominal: Soft.  Lymphadenopathy:    She has no cervical adenopathy.  Neurological: She is alert and oriented to person, place, and time. Gait normal. GCS score is 15.  Skin: Skin is warm and dry.  Psychiatric: Mood, memory, affect and judgment normal.    Assessment and Plan :  1. Fever in other diseases Flu A and B negative. - POCT Influenza A/B  2. Acute bronchitis with COPD (HCC) Will treat as bronchitis with Doxy. Follow as needed. 3. COPD, moderate (HCC) Refer back to Dr Henrene PastorMcQuiad, - Ambulatory referral to Pulmonology 4.CAD All risk factors treated.  HPI, Exam and A&P transcribed by Samara DeistAnastasiya Aleksandrova, RMA under direction and in the presence of Julieanne Mansonichard Gilbert, MD. I have done the exam and reviewed the chart and it is accurate to the best of my knowledge. DentistDragon  technology has been used and  any errors in dictation or transcription are unintentional. Julieanne Mansonichard Gilbert M.D. [facility]

## 2016-09-24 ENCOUNTER — Ambulatory Visit: Payer: Medicare Other | Admitting: Family Medicine

## 2016-09-26 DIAGNOSIS — R0602 Shortness of breath: Secondary | ICD-10-CM | POA: Diagnosis not present

## 2016-09-26 DIAGNOSIS — R011 Cardiac murmur, unspecified: Secondary | ICD-10-CM | POA: Diagnosis not present

## 2016-09-26 DIAGNOSIS — I208 Other forms of angina pectoris: Secondary | ICD-10-CM | POA: Diagnosis not present

## 2016-09-29 ENCOUNTER — Other Ambulatory Visit: Payer: Self-pay | Admitting: Family Medicine

## 2016-09-29 ENCOUNTER — Telehealth: Payer: Self-pay | Admitting: Family Medicine

## 2016-09-29 MED ORDER — PREDNISONE 20 MG PO TABS: ORAL_TABLET | ORAL | 1 refills | Status: DC

## 2016-09-29 NOTE — Telephone Encounter (Signed)
She reports that she is not having shortness of breath but does have chest tightness. She is only taking her albuterol when she exercises or does anything strenuous. She has clear thick sputum. Her main concern is that she is still coughing and has congestion. She denies any fevers, or chills. She has an appointment with Dr. Kendrick FriesMcQuaid at the end of June.

## 2016-09-29 NOTE — Telephone Encounter (Signed)
Please ask her three questions: Is she still short of breath and using her albuterol frequently? Is her sputum thick and colored? When is she scheduled to see her pulmonary doctor, McQuaid?

## 2016-09-29 NOTE — Telephone Encounter (Signed)
Please advise 

## 2016-09-29 NOTE — Telephone Encounter (Signed)
Pt was I Tuesday an week ago.  Finished antibiotic  Monday.  Still having cough and congestion.  Please advise [phone].  ThanksTeri

## 2016-09-29 NOTE — Telephone Encounter (Signed)
Pt advised.

## 2016-09-29 NOTE — Telephone Encounter (Signed)
Please have her schedule her albuterol inhaler at least twice daily and I will send in a course of prednisone.

## 2016-10-07 ENCOUNTER — Other Ambulatory Visit: Payer: Self-pay | Admitting: Family Medicine

## 2016-10-18 NOTE — Progress Notes (Signed)
[facility]      Assessment and Plan:  Patient is a 71 year old female with a history of coronary artery disease, COPD. She exercises daily for approximately one hour, and has noticed a progressive decline in her exercise capacity over the past few years.  COPD.  -Progressive dyspnea on exertion over several years, this may be a natural decline of her lung function versus, getting factors such as allergic asthma, exercise-induced asthma. -We discussed that we will treat some of the items below to see if this is reversible, if not, then this may be a natural decline for her.  Asthma.  -Given sample of Anoro inhaler, she is going to call us back if she notes a difference. -We'll start Singulair daily at bedtime, and has to take an antihistamine during the day.  Allergic rhinitis.  -Continue nasal ipratropium or allergic rhinitis.  OSA.  -We discussed the sleep apnea can contribute to pulmonary hypertension, which could contribute to dyspnea. -We'll send for sleep study to rule out sleep apnea.    Date: 10/18/2016  MRN# [id number] Penny Reed 05/28/1945    Penny Reed is a 71 y.o. old female seen in consultation for chief complaint of:    Chief Complaint  Patient presents with  . Advice Only    referred by Dr. Juliann Paresallwood  . COPD  . Shortness of Breath    HPI:   The patient is a 71 year old female with a diagnosis of emphysema, she previously [place]. [place], last in June 2016. At that time it was noted that she had dyspnea on exertion, she was doing workouts with an elliptical machine and strength exercises. She had been on Spiriva, and was being trialed on Anoro at that time.  She returns today because of increased dyspnea, she was seen by Dr. Juliann Paresallwood, sent for stress test which was negative. She notices that her dyspnea is worse with stairs, inclines, and with working out. She continues to exercise for an hour on an elliptical. She  will get winded about 30 min in, but is worse when she does her weights and machines. She also works with trainers.  She has occasional cough, and takes quinipril.  She is currently taking spiriva once daily and albuterol before working out. She takes 2 puffs of albuterol before workouts but does not notice a difference.   She has outside pets. She has never been tested for allergies. She denies sinus issues, and she takes azelastine nasal spray qhs. No issues with reflux or heartburn.   This change has happened gradually. She has worked out continually since 2007, she noticed this problem.   She was diagnosed with OSA more than 10 yrs ago, she was about the same weight then.     Personally reviewed images: CXR 09/25/14; chronic bronchitis and emphysema.  I personally reviewed PFT tracings, 09/25/14; ratio is 76%, FEV1 is 91%, no reversibility. TLC was 95%, expiratory reserve = 50%, DLCO = 4%. Flow volume loop is normal.  **CBC 08/24/16, eosinophils equals 200  PMHX:   Past Medical History:  Diagnosis Date  . Allergic rhinitis   . CAD (coronary artery disease)   . Hyperlipidemia   . Hypertension   . Hypothyroidism   . LVH (left ventricular hypertrophy)    Surgical Hx:  Past Surgical History:  Procedure Laterality Date  . ABDOMINAL HYSTERECTOMY     has left ovary  . APPENDECTOMY    . CARDIAC CATHETERIZATION  09/2005   had  1 stent and 3 balloons placed  . CORONARY ARTERY BYPASS GRAFT    . TONSILLECTOMY     Family Hx:  Family History  Problem Relation Age of Onset  . Diabetes Mother   . Stroke Mother        cause of death  . Coronary artery disease Father   . Heart attack Father   . Hyperlipidemia Sister   . Hypertension Sister   . Breast cancer Sister 35  . Cancer Sister        breast cancer  . Heart attack Brother   . Asthma Sister   . Scleroderma Sister   . Thyroid disease Sister   . Cancer - Colon Paternal Grandmother   . Breast cancer Maternal Aunt        35s    Social Hx:   Social History  Substance Use Topics  . Smoking status: Former Smoker    Packs/day: 0.25    Years: 25.00    Types: Cigarettes    Quit date: 04/25/2005  . Smokeless tobacco: Never Used  . Alcohol use Yes     Comment: 3-4 drinks 2-3 times a week.   Medication:    Current Outpatient Prescriptions:  .  5-Hydroxytryptophan (5-HTP) 100 MG CAPS, Take 1 capsule by mouth at bedtime., Disp: , Rfl:  .  albuterol (PROAIR HFA) 108 (90 BASE) MCG/ACT inhaler, Inhale 2 puffs into the lungs every 6 (six) hours as needed for wheezing or shortness of breath., Disp: 3 Inhaler, Rfl: 3 .  aspirin 81 MG tablet, Take 81 mg by mouth daily., Disp: , Rfl:  .  atorvastatin (LIPITOR) 80 MG tablet, Take 1 tablet by mouth  daily, Disp: 90 tablet, Rfl: 3 .  azelastine (ASTELIN) 0.1 % nasal spray, USE 2 SPRAYS IN EACH  NOSTRIL TWICE DAILY AS  DIRECTED, Disp: 120 mL, Rfl: 11 .  Calcium Carbonate (CALCIUM 600 PO), Take 1 tablet by mouth daily., Disp: , Rfl:  .  cetirizine (ZYRTEC) 10 MG tablet, Take 10 mg by mouth daily as needed. , Disp: , Rfl:  .  doxycycline (VIBRA-TABS) 100 MG tablet, Take 1 tablet (100 mg total) by mouth 2 (two) times daily., Disp: 14 tablet, Rfl: 0 .  glucosamine-chondroitin 500-400 MG tablet, Take 2 tablets by mouth daily., Disp: , Rfl:  .  levothyroxine (SYNTHROID, LEVOTHROID) 150 MCG tablet, Take 1 tablet (150 mcg total) by mouth daily before breakfast., Disp: 90 tablet, Rfl: 3 .  Multiple Vitamin (MULTIVITAMIN) tablet, Take 1 tablet by mouth daily., Disp: , Rfl:  .  Omega-3 Fatty Acids (FISH OIL) 1000 MG CAPS, Take 4 capsules by mouth daily., Disp: , Rfl:  .  omeprazole (PRILOSEC) 20 MG capsule, Take 1 capsule (20 mg total) by mouth daily., Disp: 90 capsule, Rfl: 3 .  predniSONE (DELTASONE) 20 MG tablet, One pill twice daily for 5 days, Disp: 10 tablet, Rfl: 1 .  quinapril (ACCUPRIL) 20 MG tablet, Take 1 tablet by mouth  daily, Disp: 90 tablet, Rfl: 3 .  SPIRIVA HANDIHALER 18 MCG  inhalation capsule, INHALE THE CONTENTS OF 1  CAPSULE VIA HANDIHALER  DAILY, Disp: 90 capsule, Rfl: 3 .  zolpidem (AMBIEN) 5 MG tablet, Take 1 tablet (5 mg total) by mouth at bedtime as needed for sleep., Disp: 90 tablet, Rfl: 1   Allergies:  Adhesive [tape]  Review of Systems: Gen:  Denies  fever, sweats, chills HEENT: Denies blurred vision, double vision. bleeds, sore throat Cvc:  No dizziness, chest pain. Resp:  Denies cough or sputum production, shortness of breath Gi: Denies swallowing difficulty, stomach pain. Gu:  Denies bladder incontinence, burning urine Ext:   No Joint pain, stiffness. Skin: No skin rash,  hives  Endoc:  No polyuria, polydipsia. Psych: No depression, insomnia. Other:  All other systems were reviewed with the patient and were negative other that what is mentioned in the HPI.   Physical Examination:   VS: BP 128/60 (BP Location: Left Arm, Cuff Size: Normal)   Pulse 60   Resp 16   Ht 5\' 7"  (1.702 m)   Wt 177 lb (80.3 kg)   SpO2 99%   BMI 27.72 kg/m   General Appearance: No distress  Neuro:without focal findings,  speech normal,  HEENT: PERRLA, EOM intact.   Pulmonary: normal breath sounds, No wheezing.  CardiovascularNormal S1,S2.  No m/r/g.   Abdomen: Benign, Soft, non-tender. Renal:  No costovertebral tenderness  GU:  No performed at this time. Endoc: No evident thyromegaly, no signs of acromegaly. Skin:   warm, no rashes, no ecchymosis  Extremities: normal, no cyanosis, clubbing.  Other findings:    LABORATORY PANEL:   CBC No results for input(s): WBC, HGB, HCT, PLT in the last 168 hours. ------------------------------------------------------------------------------------------------------------------  Chemistries  No results for input(s): NA, K, CL, CO2, GLUCOSE, BUN, CREATININE, CALCIUM, MG, AST, ALT, ALKPHOS, BILITOT in the last 168 hours.  Invalid input(s):  GFRCGP ------------------------------------------------------------------------------------------------------------------  Cardiac Enzymes No results for input(s): TROPONINI in the last 168 hours. ------------------------------------------------------------  RADIOLOGY:  No results found.     Thank  you for the consultation and for allowing [facility] to assist in the care of your patient. Our recommendations are noted above.  Please contact us if we can be of further service.   Wells Guiles, MD.  Board Certified in Internal Medicine, Pulmonary Medicine, Critical Care Medicine, and Sleep Medicine.  [facility] Office Number: [phone]  Santiago Glad, M.D.  Billy Fischer, M.D  10/18/2016

## 2016-10-19 ENCOUNTER — Ambulatory Visit (INDEPENDENT_AMBULATORY_CARE_PROVIDER_SITE_OTHER): Payer: Medicare Other | Admitting: Internal Medicine

## 2016-10-19 ENCOUNTER — Encounter: Payer: Self-pay | Admitting: Internal Medicine

## 2016-10-19 ENCOUNTER — Other Ambulatory Visit: Payer: Self-pay | Admitting: Family Medicine

## 2016-10-19 VITALS — BP 128/60 | HR 60 | Resp 16 | Ht 67.0 in | Wt 177.0 lb

## 2016-10-19 DIAGNOSIS — J4540 Moderate persistent asthma, uncomplicated: Secondary | ICD-10-CM

## 2016-10-19 DIAGNOSIS — G4733 Obstructive sleep apnea (adult) (pediatric): Secondary | ICD-10-CM

## 2016-10-19 MED ORDER — MONTELUKAST SODIUM 10 MG PO TABS: 10.0000 mg | ORAL_TABLET | Freq: Every day | ORAL | Status: DC

## 2016-10-19 NOTE — Patient Instructions (Addendum)
Take allegra every day, and singulair every night.   Will perform sleep study.   If you notice a difference with anoro, then call us back for a prescription.

## 2016-11-04 ENCOUNTER — Telehealth: Payer: Self-pay | Admitting: Internal Medicine

## 2016-11-04 MED ORDER — MONTELUKAST SODIUM 10 MG PO TABS: 10.0000 mg | ORAL_TABLET | Freq: Every day | ORAL | 2 refills | Status: DC

## 2016-11-04 MED ORDER — UMECLIDINIUM-VILANTEROL 62.5-25 MCG/INH IN AEPB: 1.0000 | INHALATION_SPRAY | Freq: Every day | RESPIRATORY_TRACT | 2 refills | Status: DC

## 2016-11-04 NOTE — Telephone Encounter (Signed)
Anoro and Singulair both sent to mail order [facility]. Patient aware that sleep study approved and she will receive a call from out office to set up.

## 2016-11-04 NOTE — Telephone Encounter (Signed)
Pt calling stating she would like prescription called in for the inhaler sample she was given. Please send this to mail order [facility] with [facility]   Also was advised she may need to go on Singular, would like advise on this She states she was also told she may need a sleep study and has not heard anything on this   Please advise

## 2016-11-07 ENCOUNTER — Ambulatory Visit (INDEPENDENT_AMBULATORY_CARE_PROVIDER_SITE_OTHER): Payer: Medicare Other | Admitting: Family Medicine

## 2016-11-07 VITALS — BP 120/60 | HR 60 | Resp 12 | Wt 175.0 lb

## 2016-11-07 DIAGNOSIS — M791 Myalgia, unspecified site: Secondary | ICD-10-CM

## 2016-11-07 NOTE — Patient Instructions (Signed)
Patient is instructed to use heat, Meloxicam and rest of the shoulder.

## 2016-11-07 NOTE — Progress Notes (Signed)
Penny Reed  MRN: [id number] DOB: 1945/09/28  Subjective:  HPI   The patient is a 71 year old female who presents for evaluation of her right shoulder pain.   She states that a couple of months ago she was at the gym working with weights in a movement that had her lifting the weight that was on her left side, using her right hand she lifted it up and over her head.  At that time she did not notice anything different but later that day she noticed pain and decreased range of motion with the right arm. She sometimes notices that the pain goes down her arm, but not into the neck or back. She treated it with Ibuprofen and rest.  She has not noticed any improvement in the shoulder.  The patient states that she was seen by the chiropractor over a month ago and was told it was muscular.  There were no x-rays obtained.  Patient Active Problem List   Diagnosis Date Noted  . Angina pectoris (HCC) 12/09/2014  . BP (high blood pressure) 12/09/2014  . Adiposity 12/09/2014  . Dyspnea 09/25/2014  . Abnormal respiratory rate 09/25/2014  . Acquired hypothyroidism 09/21/2014  . Allergic rhinitis 09/21/2014  . Arteriosclerosis of coronary artery 09/21/2014  . Essential (primary) hypertension 09/21/2014  . HLD (hyperlipidemia) 09/21/2014  . Cardiomegaly 09/21/2014  . Familial multiple lipoprotein-type hyperlipidemia 09/21/2014  . CAFL (chronic airflow limitation) (HCC) 08/06/2013  . Acid reflux 08/06/2013  . Breath shortness 08/06/2013  . Disease of thyroid gland 08/06/2013  . Tobacco abuse 06/21/2013  . COPD, moderate (HCC) 05/24/2013    Past Medical History:  Diagnosis Date  . Allergic rhinitis   . CAD (coronary artery disease)   . Hyperlipidemia   . Hypertension   . Hypothyroidism   . LVH (left ventricular hypertrophy)     Social History   Social History  . Marital status: Married    Spouse name: N/A  . Number of children: N/A  . Years of education: N/A   Occupational History   . Not on file.   Social History Main Topics  . Smoking status: Former Smoker    Packs/day: 0.25    Years: 25.00    Types: Cigarettes    Quit date: 04/25/2005  . Smokeless tobacco: Never Used  . Alcohol use Yes     Comment: 3-4 drinks 2-3 times a week.  . Drug use: No  . Sexual activity: Not on file   Other Topics Concern  . Not on file   Social History Narrative  . No narrative on file    Outpatient Encounter Prescriptions as of 11/07/2016  Medication Sig  . 5-Hydroxytryptophan (5-HTP) 100 MG CAPS Take 1 capsule by mouth at bedtime.  [name] albuterol (PROAIR HFA) 108 (90 BASE) MCG/ACT inhaler Inhale 2 puffs into the lungs every 6 (six) hours as needed for wheezing or shortness of breath.  [name] aspirin 81 MG tablet Take 81 mg by mouth daily.  [name] atorvastatin (LIPITOR) 80 MG tablet TAKE 1 TABLET BY MOUTH  DAILY  . azelastine (ASTELIN) 0.1 % nasal spray USE 2 SPRAYS IN EACH  NOSTRIL TWICE DAILY AS  DIRECTED  . Calcium Carbonate (CALCIUM 600 PO) Take 1 tablet by mouth daily.  . cetirizine (ZYRTEC) 10 MG tablet Take 10 mg by mouth daily as needed.   [name] glucosamine-chondroitin 500-400 MG tablet Take 2 tablets by mouth daily.  [name] levothyroxine (SYNTHROID, LEVOTHROID) 150 MCG tablet Take 1 tablet (150 mcg  total) by mouth daily before breakfast.  . montelukast (SINGULAIR) 10 MG tablet Take 1 tablet (10 mg total) by mouth at bedtime.  . Multiple Vitamin (MULTIVITAMIN) tablet Take 1 tablet by mouth daily.  . Omega-3 Fatty Acids (FISH OIL) 1000 MG CAPS Take 4 capsules by mouth daily.  [name]. omeprazole (PRILOSEC) 20 MG capsule Take 1 capsule (20 mg total) by mouth daily.  . quinapril (ACCUPRIL) 20 MG tablet TAKE 1 TABLET BY MOUTH  DAILY  . SPIRIVA HANDIHALER 18 MCG inhalation capsule INHALE THE CONTENTS OF 1  CAPSULE VIA HANDIHALER  DAILY  . umeclidinium-vilanterol (ANORO ELLIPTA) 62.5-25 MCG/INH AEPB Inhale 1 puff into the lungs daily.  [name]. zolpidem (AMBIEN) 5 MG tablet Take 1 tablet (5 mg total) by mouth  at bedtime as needed for sleep.   No facility-administered encounter medications on file as of 11/07/2016.     Allergies  Allergen Reactions  . Adhesive [Tape]     Band-aids irritate skin    Review of Systems  Respiratory: Negative for cough, shortness of breath and wheezing.   Cardiovascular: Negative for chest pain, palpitations and orthopnea.  Musculoskeletal: Positive for joint pain (right shoulder). Negative for back pain, myalgias and neck pain.  Neurological: Negative for tingling.    Objective:  BP 120/60 (BP Location: Right Arm, Patient Position: Sitting, Cuff Size: Normal)   Pulse 60   Resp 12   Wt 175 lb (79.4 kg)   BMI 27.41 kg/m   Physical Exam  Constitutional: She is well-developed, well-nourished, and in no distress.  HENT:  Head: Normocephalic and atraumatic.  Eyes: Pupils are equal, round, and reactive to light. Conjunctivae are normal.  Neck: Normal range of motion.  Cardiovascular: Normal rate, regular rhythm and normal heart sounds.   Pulmonary/Chest: Effort normal and breath sounds normal.  Musculoskeletal: She exhibits no tenderness or deformity.  Pain with any abduction of the right shoulder    Assessment and Plan :   1. Muscular pain This does not present as rotator cuff syndrome. Patient is instructed to use heat, Meloxicam and rest of the shoulder. If not improved n 1 month will refer to ortho, she will not need to come in for that referral. 2.CAD Risk factors treated.  HPI, Exam and A&P Transcribed under the direction and in the presence of Julieanne Mansonichard Siham Bucaro, Montez HagemanJr., MD. Electronically Signed: Janey GreaserElena DeSanto, RMA I have done the exam and reviewed the chart and it is accurate to the best of my knowledge. [employer]  technology has been used and  any errors in dictation or transcription are unintentional. Julieanne Mansonichard Calob Baskette M.D. [facility]

## 2016-11-08 ENCOUNTER — Encounter: Payer: Self-pay | Admitting: Internal Medicine

## 2016-11-08 DIAGNOSIS — G4733 Obstructive sleep apnea (adult) (pediatric): Secondary | ICD-10-CM

## 2016-11-11 DIAGNOSIS — G4733 Obstructive sleep apnea (adult) (pediatric): Secondary | ICD-10-CM | POA: Diagnosis not present

## 2016-11-16 ENCOUNTER — Telehealth: Payer: Self-pay | Admitting: *Deleted

## 2016-11-16 DIAGNOSIS — G4733 Obstructive sleep apnea (adult) (pediatric): Secondary | ICD-10-CM

## 2016-11-16 NOTE — Telephone Encounter (Signed)
Pt informed of sleep study results. Order placed for auto CPAP. Nothing further needed. 

## 2016-12-09 ENCOUNTER — Ambulatory Visit (INDEPENDENT_AMBULATORY_CARE_PROVIDER_SITE_OTHER): Payer: Medicare Other | Admitting: Physician Assistant

## 2016-12-09 ENCOUNTER — Encounter: Payer: Self-pay | Admitting: Physician Assistant

## 2016-12-09 VITALS — BP 102/54 | HR 60 | Temp 98.3°F | Resp 16 | Wt 169.0 lb

## 2016-12-09 DIAGNOSIS — N3090 Cystitis, unspecified without hematuria: Secondary | ICD-10-CM | POA: Diagnosis not present

## 2016-12-09 LAB — POCT URINALYSIS DIPSTICK
Bilirubin, UA: NEGATIVE
Glucose, UA: NEGATIVE
Ketones, UA: NEGATIVE
Nitrite, UA: NEGATIVE
Spec Grav, UA: 1.01 (ref 1.010–1.025)
Urobilinogen, UA: 0.2 E.U./dL
pH, UA: 8 (ref 5.0–8.0)

## 2016-12-09 MED ORDER — SULFAMETHOXAZOLE-TRIMETHOPRIM 800-160 MG PO TABS: 1.0000 | ORAL_TABLET | Freq: Two times a day (BID) | ORAL | 0 refills | Status: AC

## 2016-12-09 NOTE — Patient Instructions (Signed)

## 2016-12-09 NOTE — Progress Notes (Signed)
[facility]  Chief Complaint  Patient presents with  . Urinary Tract Infection    Subjective:    Patient ID: Penny Reed, female    DOB: 23-Sep-1945, 71 y.o.   MRN: [id number]   Urinary Tract Infection: Patient complains of burning with urination, dysuria, frequency and urgency She has had symptoms for 2 weeks. Patient also complains of none. Patient denies back pain, fever, stomach ache and vaginal discharge. No fevers, chills, n/v. Patient does not have a history of recurrent UTI.  Patient does not have a history of pyelonephritis or other renal issues. Patient denies vaginal discharge and denies new sexual partners. Denies concern for STI.The patient denies recent travel outside of the [place].  Review of Systems  Constitutional: Positive for malaise/fatigue. Negative for chills, diaphoresis, fever and weight loss.  Gastrointestinal: Negative.   Genitourinary: Positive for dysuria, frequency and urgency. Negative for flank pain and hematuria.  Neurological: Negative for dizziness, weakness and headaches.       Objective:   BP (!) 102/54 (BP Location: Left Arm, Patient Position: Sitting, Cuff Size: Normal)   Pulse 60   Temp 98.3 F (36.8 C) (Oral)   Resp 16   Wt 169 lb (76.7 kg)   BMI 26.47 kg/m   Patient Active Problem List   Diagnosis Date Noted  . Angina pectoris (HCC) 12/09/2014  . BP (high blood pressure) 12/09/2014  . Adiposity 12/09/2014  . Dyspnea 09/25/2014  . Abnormal respiratory rate 09/25/2014  . Acquired hypothyroidism 09/21/2014  . Allergic rhinitis 09/21/2014  . Arteriosclerosis of coronary artery 09/21/2014  . Essential (primary) hypertension 09/21/2014  . HLD (hyperlipidemia) 09/21/2014  . Cardiomegaly 09/21/2014  . Familial multiple lipoprotein-type hyperlipidemia 09/21/2014  . CAFL (chronic airflow limitation) (HCC) 08/06/2013  . Acid reflux 08/06/2013  . Breath shortness 08/06/2013  . Disease of thyroid gland 08/06/2013  .  Tobacco abuse 06/21/2013  . COPD, moderate (HCC) 05/24/2013    Outpatient Encounter Prescriptions as of 12/09/2016  Medication Sig  . 5-Hydroxytryptophan (5-HTP) 100 MG CAPS Take 1 capsule by mouth at bedtime.  [name] albuterol (PROAIR HFA) 108 (90 BASE) MCG/ACT inhaler Inhale 2 puffs into the lungs every 6 (six) hours as needed for wheezing or shortness of breath.  [name] aspirin 81 MG tablet Take 81 mg by mouth daily.  [name] atorvastatin (LIPITOR) 80 MG tablet TAKE 1 TABLET BY MOUTH  DAILY  . azelastine (ASTELIN) 0.1 % nasal spray USE 2 SPRAYS IN EACH  NOSTRIL TWICE DAILY AS  DIRECTED  . Calcium Carbonate (CALCIUM 600 PO) Take 1 tablet by mouth daily.  . cetirizine (ZYRTEC) 10 MG tablet Take 10 mg by mouth daily as needed.   [name] glucosamine-chondroitin 500-400 MG tablet Take 2 tablets by mouth daily.  [name] levothyroxine (SYNTHROID, LEVOTHROID) 150 MCG tablet Take 1 tablet (150 mcg total) by mouth daily before breakfast.  . montelukast (SINGULAIR) 10 MG tablet Take 1 tablet (10 mg total) by mouth at bedtime.  . Multiple Vitamin (MULTIVITAMIN) tablet Take 1 tablet by mouth daily.  . Omega-3 Fatty Acids (FISH OIL) 1000 MG CAPS Take 4 capsules by mouth daily.  [name] omeprazole (PRILOSEC) 20 MG capsule Take 1 capsule (20 mg total) by mouth daily.  . quinapril (ACCUPRIL) 20 MG tablet TAKE 1 TABLET BY MOUTH  DAILY  . umeclidinium-vilanterol (ANORO ELLIPTA) 62.5-25 MCG/INH AEPB Inhale 1 puff into the lungs daily.  [name] zolpidem (AMBIEN) 5 MG tablet Take 1 tablet (5 mg total) by mouth at bedtime as needed  for sleep.  . [DISCONTINUED] SPIRIVA HANDIHALER 18 MCG inhalation capsule INHALE THE CONTENTS OF 1  CAPSULE VIA HANDIHALER  DAILY  . sulfamethoxazole-trimethoprim (BACTRIM DS) 800-160 MG tablet Take 1 tablet by mouth 2 (two) times daily.   No facility-administered encounter medications on file as of 12/09/2016.     Allergies  Allergen Reactions  . Adhesive [Tape]     Band-aids irritate skin       Physical Exam   Constitutional: She appears well-developed and well-nourished.  Cardiovascular: Normal rate and regular rhythm.   Pulmonary/Chest: Effort normal and breath sounds normal.  Abdominal: Soft. Bowel sounds are normal. She exhibits no distension. There is no tenderness. There is no CVA tenderness.  Skin: Skin is warm and dry.  Psychiatric: She has a normal mood and affect. Her behavior is normal.       Assessment & Plan:  1. Cystitis  - POCT urinalysis dipstick - Urine Culture - sulfamethoxazole-trimethoprim (BACTRIM DS) 800-160 MG tablet; Take 1 tablet by mouth 2 (two) times daily.  Dispense: 14 tablet; Refill: 0  No Follow-up on file.  The entirety of the information documented in the History of Present Illness, Review of Systems and Physical Exam were personally obtained by me. Portions of this information were initially documented by Kavin Leech, CMA and reviewed by me for thoroughness and accuracy.

## 2016-12-11 LAB — URINE CULTURE

## 2016-12-11 LAB — PLEASE NOTE

## 2016-12-16 ENCOUNTER — Telehealth: Payer: Self-pay | Admitting: Internal Medicine

## 2016-12-16 NOTE — Telephone Encounter (Signed)
Pt stating DME not in network for CPAP. Please call pt.

## 2016-12-16 NOTE — Telephone Encounter (Signed)
Patient has an order for sleep apnea machine. We sent the referral and the company is not in network for patient .  Please call to discuss.

## 2016-12-16 NOTE — Telephone Encounter (Signed)
Called [facility] and [facility] is not in network with [facility] [employer] Medicare.   Pt stated that she had seen a dentist that makes the [employer] Mouth Guards for sleep apnea patients and patient is interested in trying this first if physician thinks this would work instead of CPAP.   Please advise if you think that this is a suitable option for patient before she proceeds.  Pt has requested that CPAP order be placed on hold until after physician address the mouth guard question. Dr. Ardyth Man, please advise.  Rhonda J Cobb

## 2016-12-19 NOTE — Telephone Encounter (Signed)
Her sleep apnea was moderate, therefore it would be appropriate to proceed with this. She should be aware, that she may have to undergo another sleep study to confirm that her sleep apnea is treated after getting fitted with the device.

## 2016-12-19 NOTE — Telephone Encounter (Signed)
Called and spoke with patient and gave her Dr. Nicholos Johns comments. Pt is aware that she will have to undergo a "HST" to test the device to make sure it is correcting the issue.    Advised patient that I would cancel the CPAP Order as of now, that if she decided that she wanted to go the CPAP route, to call us.  Pt voiced understanding and appreciated me getting back with her. Nothing else is needed at this time as patient will pursue the oral mouth guard at this time. Rhonda J Cobb

## 2017-01-03 ENCOUNTER — Telehealth: Payer: Self-pay | Admitting: Internal Medicine

## 2017-01-03 NOTE — Telephone Encounter (Signed)
Calling to follow up on fax for order on order appliance to be signed for sleep apnea. Please call with status

## 2017-03-08 DIAGNOSIS — R0602 Shortness of breath: Secondary | ICD-10-CM | POA: Diagnosis not present

## 2017-03-08 DIAGNOSIS — R001 Bradycardia, unspecified: Secondary | ICD-10-CM | POA: Diagnosis not present

## 2017-03-08 DIAGNOSIS — I208 Other forms of angina pectoris: Secondary | ICD-10-CM | POA: Diagnosis not present

## 2017-03-08 DIAGNOSIS — J449 Chronic obstructive pulmonary disease, unspecified: Secondary | ICD-10-CM | POA: Diagnosis not present

## 2017-03-08 DIAGNOSIS — R011 Cardiac murmur, unspecified: Secondary | ICD-10-CM | POA: Diagnosis not present

## 2017-03-27 ENCOUNTER — Other Ambulatory Visit: Payer: Self-pay

## 2017-03-27 NOTE — Telephone Encounter (Signed)
Request for Zolpidem from [facility] per patient request. Patient transferred there from mail order and they do not have active RX on file for her. Please review-Anastasiya V Hopkins, RMA

## 2017-03-28 MED ORDER — ZOLPIDEM TARTRATE 5 MG PO TABS: 5.0000 mg | ORAL_TABLET | Freq: Every evening | ORAL | 4 refills | Status: DC | PRN

## 2017-03-28 NOTE — Telephone Encounter (Signed)
OK--4 rf.

## 2017-05-22 DIAGNOSIS — R69 Illness, unspecified: Secondary | ICD-10-CM | POA: Diagnosis not present

## 2017-07-06 ENCOUNTER — Other Ambulatory Visit: Payer: Self-pay | Admitting: Family Medicine

## 2017-07-06 DIAGNOSIS — E039 Hypothyroidism, unspecified: Secondary | ICD-10-CM

## 2017-07-12 ENCOUNTER — Ambulatory Visit (INDEPENDENT_AMBULATORY_CARE_PROVIDER_SITE_OTHER): Payer: Medicare HMO

## 2017-07-12 ENCOUNTER — Ambulatory Visit (INDEPENDENT_AMBULATORY_CARE_PROVIDER_SITE_OTHER): Payer: Medicare HMO | Admitting: Family Medicine

## 2017-07-12 ENCOUNTER — Encounter: Payer: Self-pay | Admitting: Family Medicine

## 2017-07-12 ENCOUNTER — Telehealth: Payer: Self-pay | Admitting: *Deleted

## 2017-07-12 VITALS — BP 192/68 | HR 60 | Temp 97.8°F | Ht 67.0 in | Wt 171.6 lb

## 2017-07-12 VITALS — BP 186/64 | HR 60 | Temp 97.8°F | Ht 67.0 in | Wt 171.6 lb

## 2017-07-12 DIAGNOSIS — Z1211 Encounter for screening for malignant neoplasm of colon: Secondary | ICD-10-CM

## 2017-07-12 DIAGNOSIS — E039 Hypothyroidism, unspecified: Secondary | ICD-10-CM | POA: Diagnosis not present

## 2017-07-12 DIAGNOSIS — I10 Essential (primary) hypertension: Secondary | ICD-10-CM

## 2017-07-12 DIAGNOSIS — Z1231 Encounter for screening mammogram for malignant neoplasm of breast: Secondary | ICD-10-CM | POA: Diagnosis not present

## 2017-07-12 DIAGNOSIS — Z0000 Encounter for general adult medical examination without abnormal findings: Secondary | ICD-10-CM | POA: Diagnosis not present

## 2017-07-12 DIAGNOSIS — E7800 Pure hypercholesterolemia, unspecified: Secondary | ICD-10-CM | POA: Diagnosis not present

## 2017-07-12 DIAGNOSIS — Z1239 Encounter for other screening for malignant neoplasm of breast: Secondary | ICD-10-CM

## 2017-07-12 LAB — IFOBT (OCCULT BLOOD): IFOBT: NEGATIVE

## 2017-07-12 NOTE — Telephone Encounter (Signed)
Received referral for low dose lung cancer screening CT scan. Message left at phone number listed in EMR for patient to call me back to facilitate scheduling scan.  

## 2017-07-12 NOTE — Progress Notes (Signed)
Subjective:   Penny Reed is a 72 y.o. female who presents for Medicare Annual (Subsequent) preventive examination.  Review of Systems:  N/A  Cardiac Risk Factors include: advanced age (>62men, >69 women);dyslipidemia;hypertension     Objective:     Vitals: BP (!) 192/68 (BP Location: Left Arm)   Pulse 60   Temp 97.8 F (36.6 C) (Oral)   Ht 5\' 7"  (1.702 m)   Wt 171 lb 9.6 oz (77.8 kg)   BMI 26.88 kg/m   Body mass index is 26.88 kg/m.  Advanced Directives 07/12/2017 11/07/2016 12/09/2014  Does Patient Have a Medical Advance Directive? Yes Yes Yes  Type of Estate agent of [place];Living will Living will Living will  Copy of Healthcare Power of Attorney in Chart? No - copy requested - -    Tobacco Social History   Tobacco Use  Smoking Status Former Smoker  . Packs/day: 0.25  . Years: 25.00  . Pack years: 6.25  . Types: Cigarettes  . Last attempt to quit: 04/25/2005  . Years since quitting: 12.2  Smokeless Tobacco Never Used     Counseling given: Not Answered   Clinical Intake:  Pre-visit preparation completed: Yes  Pain : No/denies pain Pain Score: 0-No pain     Nutritional Status: BMI 25 -29 Overweight Nutritional Risks: None Diabetes: No  How often do you need to have someone help you when you read instructions, pamphlets, or other written materials from your doctor or pharmacy?: 1 - Never  Interpreter Needed?: No  Information entered by :: [facility], LPN  Past Medical History:  Diagnosis Date  . Allergic rhinitis   . CAD (coronary artery disease)   . Hyperlipidemia   . Hypertension   . Hypothyroidism   . LVH (left ventricular hypertrophy)    Past Surgical History:  Procedure Laterality Date  . ABDOMINAL HYSTERECTOMY     has left ovary  . APPENDECTOMY    . CARDIAC CATHETERIZATION  09/2005   had 1 stent and 3 balloons placed  . CORONARY ARTERY BYPASS GRAFT    . TONSILLECTOMY     Family History  Problem  Relation Age of Onset  . Diabetes Mother   . Stroke Mother        cause of death  . Coronary artery disease Father   . Heart attack Father   . Hyperlipidemia Sister   . Hypertension Sister   . Breast cancer Sister 95  . Cancer Sister        breast cancer  . Asthma Sister   . Scleroderma Sister   . Thyroid disease Sister   . Heart attack Brother   . Cancer - Colon Paternal Grandmother   . Breast cancer Maternal Aunt        31s   Social History   Socioeconomic History  . Marital status: Married    Spouse name: None  . Number of children: 3  . Years of education: None  . Highest education level: Associate degree: occupational, Scientist, product/process development, or vocational program  Social Needs  . Financial resource strain: Not hard at all  . Food insecurity - worry: Never true  . Food insecurity - inability: Never true  . Transportation needs - medical: No  . Transportation needs - non-medical: No  Occupational History  . None  Tobacco Use  . Smoking status: Former Smoker    Packs/day: 0.25    Years: 25.00    Pack years: 6.25    Types: Cigarettes  Last attempt to quit: 04/25/2005    Years since quitting: 12.2  . Smokeless tobacco: Never Used  Substance and Sexual Activity  . Alcohol use: Yes    Alcohol/week: 0.6 - 1.2 oz    Types: 1 - 2 Glasses of wine per week  . Drug use: No  . Sexual activity: None  Other Topics Concern  . None  Social History Narrative  . None    Outpatient Encounter Medications as of 07/12/2017  Medication Sig  . albuterol (PROAIR HFA) 108 (90 BASE) MCG/ACT inhaler Inhale 2 puffs into the lungs every 6 (six) hours as needed for wheezing or shortness of breath.  [name] aspirin 81 MG tablet Take 81 mg by mouth daily.  [name] atorvastatin (LIPITOR) 80 MG tablet TAKE 1 TABLET BY MOUTH  DAILY  . azelastine (ASTELIN) 0.1 % nasal spray USE 2 SPRAYS IN EACH  NOSTRIL TWICE DAILY AS  DIRECTED (Patient taking differently: Place 2 sprays into both nostrils daily. USE 2 SPRAYS IN  EACH  NOSTRIL TWICE DAILY AS  DIRECTED)  . Calcium Carbonate (CALCIUM 600 PO) Take 1 tablet by mouth daily.  . cetirizine (ZYRTEC) 10 MG tablet Take 10 mg by mouth daily as needed.   [name] levothyroxine (SYNTHROID, LEVOTHROID) 150 MCG tablet TAKE 1 TABLET BY MOUTH DAILY BEFORE BREAKFAST  . montelukast (SINGULAIR) 10 MG tablet Take 1 tablet (10 mg total) by mouth at bedtime.  . Multiple Vitamin (MULTIVITAMIN) tablet Take 1 tablet by mouth daily.  . Omega-3 Fatty Acids (FISH OIL) 1000 MG CAPS Take 4 capsules by mouth daily.  . quinapril (ACCUPRIL) 20 MG tablet TAKE 1 TABLET BY MOUTH  DAILY  . umeclidinium-vilanterol (ANORO ELLIPTA) 62.5-25 MCG/INH AEPB Inhale 1 puff into the lungs daily.  [name] zolpidem (AMBIEN) 5 MG tablet Take 1 tablet (5 mg total) by mouth at bedtime as needed for sleep.  [name] 5-Hydroxytryptophan (5-HTP) 100 MG CAPS Take 1 capsule by mouth at bedtime.  [name] glucosamine-chondroitin 500-400 MG tablet Take 2 tablets by mouth daily.  [name] omeprazole (PRILOSEC) 20 MG capsule Take 1 capsule (20 mg total) by mouth daily. (Patient not taking: Reported on 07/12/2017)   No facility-administered encounter medications on file as of 07/12/2017.     Activities of Daily Living In your present state of health, do you have any difficulty performing the following activities: 07/12/2017 08/24/2016  Hearing? N N  Vision? N N  Difficulty concentrating or making decisions? N N  Walking or climbing stairs? N N  Dressing or bathing? N N  Doing errands, shopping? N N  Preparing Food and eating ? N -  Using the Toilet? N -  In the past six months, have you accidently leaked urine? Y -  Comment Occasionally, wears protection. -  Do you have problems with loss of bowel control? N -  Managing your Medications? N -  Managing your Finances? N -  Housekeeping or managing your Housekeeping? N -  Some recent data might be hidden    Patient Care Team: Maple Hudson., MD as PCP - General (Family  Medicine) Domingo Madeira, OD as Consulting Physician (Optometry) Alwyn Pea, MD as Consulting Physician (Cardiology) Shane Crutch, MD as Consulting Physician (Pulmonary Disease)    Assessment:   This is a routine wellness examination for Penny Reed.  Exercise Activities and Dietary recommendations Current Exercise Habits: Structured exercise class, Type of exercise: stretching;strength training/weights;Other - see comments;walking(eliptical), Time (Minutes): 60, Frequency (Times/Week): 6, Weekly Exercise (Minutes/Week): 360, Intensity: Moderate  Goals    None      Fall Risk Fall Risk  07/12/2017 08/24/2016 06/15/2015  Falls in the past year? No No No   Is the patient's home free of loose throw rugs in walkways, pet beds, electrical cords, etc?   yes      Grab bars in the bathroom? no      Handrails on the stairs?   yes      Adequate lighting?   yes  Timed Get Up and Go performed: N/A  Depression Screen PHQ 2/9 Scores 07/12/2017 08/24/2016 08/24/2016 06/15/2015  PHQ - 2 Score 0 0 0 0  PHQ- 9 Score - 4 - -     Cognitive Function     6CIT Screen 07/12/2017  What Year? 0 points  What month? 0 points  What time? 0 points  Count back from 20 0 points  Months in reverse 0 points  Repeat phrase 0 points  Total Score 0    Immunization History  Administered Date(s) Administered  . Influenza,inj,Quad PF,6+ Mos 03/20/2013  . Influenza-Unspecified 12/24/2013, 12/24/2013, 01/24/2015  . Pneumococcal Conjugate-13 03/20/2013  . Pneumococcal Polysaccharide-23 04/02/2014  . Pneumococcal-Unspecified 03/24/2013  . Td 07/10/2003    Qualifies for Shingles Vaccine? Due for Shingles vaccine. Declined my offer to administer today. Education has been provided regarding the importance of this vaccine. Pt has been advised to call her insurance company to determine her out of pocket expense. Advised she may also receive this vaccine at her local pharmacy or Health Dept. Verbalized  acceptance and understanding.  Screening Tests Health Maintenance  Topic Date Due  . TETANUS/TDAP  07/09/2013  . MAMMOGRAM  08/23/2018  . COLONOSCOPY  11/21/2019  . INFLUENZA VACCINE  Completed  . DEXA SCAN  Completed  . Hepatitis C Screening  Completed  . PNA vac Low Risk Adult  Completed    Cancer Screenings: Lung: Low Dose CT Chest recommended if Age 16-80 years, 30 pack-year currently smoking OR have quit w/in 15years. Patient does qualify. An Epic message has been sent to Glenna FellowsShawn Perkins, RN (Oncology Nurse Navigator) regarding the possible need for this exam. Ines BloomerShawn will review the patient's chart to determine if the patient truly qualifies for the exam. If the patient qualifies, Ines BloomerShawn will order the Low Dose CT of the chest to facilitate the scheduling of this exam. Breast:  Up to date on Mammogram? Yes   Up to date of Bone Density/Dexa? Yes Colorectal: Up to date  Additional Screenings:  Hepatitis C Screening: Up to date     Plan:  I have personally reviewed and addressed the Medicare Annual Wellness questionnaire and have noted the following in the patient's chart:  A. Medical and social history B. Use of alcohol, tobacco or illicit drugs  C. Current medications and supplements D. Functional ability and status E.  Nutritional status F.  Physical activity G. Advance directives H. List of other physicians I.  Hospitalizations, surgeries, and ER visits in previous 12 months J.  Vitals K. Screenings such as hearing and vision if needed, cognitive and depression L. Referrals and appointments - none  In addition, I have reviewed and discussed with patient certain preventive protocols, quality metrics, and best practice recommendations. A written personalized care plan for preventive services as well as general preventive health recommendations were provided to patient.  See attached scanned questionnaire for additional information.   Signed,  Hyacinth MeekerMckenzie Nolan Tuazon, LPN Nurse  Health Advisor   Nurse Recommendations: Pt declined the tetanus vaccine today.  Message sent to oncology nurse navigator to see if pt is eligible for a low dose CT scan due to smoking history. Please f/u on BP. Both readings were high today (taken 30 minutes apart).

## 2017-07-12 NOTE — Patient Instructions (Addendum)
Penny Reed , Thank you for taking time to come for your Medicare Wellness Visit. I appreciate your ongoing commitment to your health goals. Please review the following plan we discussed and let me know if I can assist you in the future.   Screening recommendations/referrals: Colonoscopy: Up to date Mammogram: Up to date Bone Density: Up to date Recommended yearly ophthalmology/optometry visit for glaucoma screening and checkup Recommended yearly dental visit for hygiene and checkup  Vaccinations: Influenza vaccine: Up to date Pneumococcal vaccine: Up to date Tdap vaccine: Pt declines today.  Shingles vaccine: Pt declines today.     Advanced directives: Please bring a copy of your POA (Power of Attorney) and/or Living Will to your next appointment.   Conditions/risks identified: Recommend to continue with weight watchers diet and cutting out carbohydrates.   Next appointment: 9:00 AM with Dr Sullivan LoneGilbert.   Preventive Care 7665 Years and Older, Female Preventive care refers to lifestyle choices and visits with your health care provider that can promote health and wellness. What does preventive care include?  A yearly physical exam. This is also called an annual well check.  Dental exams once or twice a year.  Routine eye exams. Ask your health care provider how often you should have your eyes checked.  Personal lifestyle choices, including:  Daily care of your teeth and gums.  Regular physical activity.  Eating a healthy diet.  Avoiding tobacco and drug use.  Limiting alcohol use.  Practicing safe sex.  Taking low-dose aspirin every day.  Taking vitamin and mineral supplements as recommended by your health care provider. What happens during an annual well check? The services and screenings done by your health care provider during your annual well check will depend on your age, overall health, lifestyle risk factors, and family history of disease. Counseling  Your health  care provider may ask you questions about your:  Alcohol use.  Tobacco use.  Drug use.  Emotional well-being.  Home and relationship well-being.  Sexual activity.  Eating habits.  History of falls.  Memory and ability to understand (cognition).  Work and work Astronomerenvironment.  Reproductive health. Screening  You may have the following tests or measurements:  Height, weight, and BMI.  Blood pressure.  Lipid and cholesterol levels. These may be checked every 5 years, or more frequently if you are over 70 years old.  Skin check.  Lung cancer screening. You may have this screening every year starting at age 70 if you have a 30-pack-year history of smoking and currently smoke or have quit within the past 15 years.  Fecal occult blood test (FOBT) of the stool. You may have this test every year starting at age 70.  Flexible sigmoidoscopy or colonoscopy. You may have a sigmoidoscopy every 5 years or a colonoscopy every 10 years starting at age 70.  Hepatitis C blood test.  Hepatitis B blood test.  Sexually transmitted disease (STD) testing.  Diabetes screening. This is done by checking your blood sugar (glucose) after you have not eaten for a while (fasting). You may have this done every 1-3 years.  Bone density scan. This is done to screen for osteoporosis. You may have this done starting at age 70.  Mammogram. This may be done every 1-2 years. Talk to your health care provider about how often you should have regular mammograms. Talk with your health care provider about your test results, treatment options, and if necessary, the need for more tests. Vaccines  Your health care provider may  recommend certain vaccines, such as:  Influenza vaccine. This is recommended every year.  Tetanus, diphtheria, and acellular pertussis (Tdap, Td) vaccine. You may need a Td booster every 10 years.  Zoster vaccine. You may need this after age 26.  Pneumococcal 13-valent conjugate  (PCV13) vaccine. One dose is recommended after age 59.  Pneumococcal polysaccharide (PPSV23) vaccine. One dose is recommended after age 61. Talk to your health care provider about which screenings and vaccines you need and how often you need them. This information is not intended to replace advice given to you by your health care provider. Make sure you discuss any questions you have with your health care provider. Document Released: 05/08/2015 Document Revised: 12/30/2015 Document Reviewed: 02/10/2015 Elsevier Interactive Patient Education  2017 [place] Prevention in the Home Falls can cause injuries. They can happen to people of all ages. There are many things you can do to make your home safe and to help prevent falls. What can I do on the outside of my home?  Regularly fix the edges of walkways and driveways and fix any cracks.  Remove anything that might make you trip as you walk through a door, such as a raised step or threshold.  Trim any bushes or trees on the path to your home.  Use bright outdoor lighting.  Clear any walking paths of anything that might make someone trip, such as rocks or tools.  Regularly check to see if handrails are loose or broken. Make sure that both sides of any steps have handrails.  Any raised decks and porches should have guardrails on the edges.  Have any leaves, snow, or ice cleared regularly.  Use sand or salt on walking paths during winter.  Clean up any spills in your garage right away. This includes oil or grease spills. What can I do in the bathroom?  Use night lights.  Install grab bars by the toilet and in the tub and shower. Do not use towel bars as grab bars.  Use non-skid mats or decals in the tub or shower.  If you need to sit down in the shower, use a plastic, non-slip stool.  Keep the floor dry. Clean up any water that spills on the floor as soon as it happens.  Remove soap buildup in the tub or shower  regularly.  Attach bath mats securely with double-sided non-slip rug tape.  Do not have throw rugs and other things on the floor that can make you trip. What can I do in the bedroom?  Use night lights.  Make sure that you have a light by your bed that is easy to reach.  Do not use any sheets or blankets that are too big for your bed. They should not hang down onto the floor.  Have a firm chair that has side arms. You can use this for support while you get dressed.  Do not have throw rugs and other things on the floor that can make you trip. What can I do in the kitchen?  Clean up any spills right away.  Avoid walking on wet floors.  Keep items that you use a lot in easy-to-reach places.  If you need to reach something above you, use a strong step stool that has a grab bar.  Keep electrical cords out of the way.  Do not use floor polish or wax that makes floors slippery. If you must use wax, use non-skid floor wax.  Do not have throw rugs and  other things on the floor that can make you trip. What can I do with my stairs?  Do not leave any items on the stairs.  Make sure that there are handrails on both sides of the stairs and use them. Fix handrails that are broken or loose. Make sure that handrails are as long as the stairways.  Check any carpeting to make sure that it is firmly attached to the stairs. Fix any carpet that is loose or worn.  Avoid having throw rugs at the top or bottom of the stairs. If you do have throw rugs, attach them to the floor with carpet tape.  Make sure that you have a light switch at the top of the stairs and the bottom of the stairs. If you do not have them, ask someone to add them for you. What else can I do to help prevent falls?  Wear shoes that:  Do not have high heels.  Have rubber bottoms.  Are comfortable and fit you well.  Are closed at the toe. Do not wear sandals.  If you use a stepladder:  Make sure that it is fully  opened. Do not climb a closed stepladder.  Make sure that both sides of the stepladder are locked into place.  Ask someone to hold it for you, if possible.  Clearly mark and make sure that you can see:  Any grab bars or handrails.  First and last steps.  Where the edge of each step is.  Use tools that help you move around (mobility aids) if they are needed. These include:  Canes.  Walkers.  Scooters.  Crutches.  Turn on the lights when you go into a dark area. Replace any light bulbs as soon as they burn out.  Set up your furniture so you have a clear path. Avoid moving your furniture around.  If any of your floors are uneven, fix them.  If there are any pets around you, be aware of where they are.  Review your medicines with your doctor. Some medicines can make you feel dizzy. This can increase your chance of falling. Ask your doctor what other things that you can do to help prevent falls. This information is not intended to replace advice given to you by your health care provider. Make sure you discuss any questions you have with your health care provider. Document Released: 02/05/2009 Document Revised: 09/17/2015 Document Reviewed: 05/16/2014 Elsevier Interactive Patient Education  2017 [employer].

## 2017-07-12 NOTE — Progress Notes (Signed)
Patient: Penny AranCarolyn H Taplin, Female    DOB: 05-Sep-1945, 72 y.o.   MRN: [id number] Visit Date: 07/12/2017  Today's Provider: Megan Mansichard Gilbert Jr, MD   Chief Complaint  Patient presents with  . Annual Exam   Subjective:    Annual physical exam Penny Reed is a 72 y.o. female who presents today for health maintenance and complete physical. She feels well. She reports exercising 6 days a week. Cardio and weights. She reports she is sleeping fairly well. Most nights. She did not sleep well last night. Her blood pressure is elevated today but did not take her blood pressure pill this morning.   ----------------------------------------------------------------- Colonoscopy- 11/20/09 [place] Diverticulosis Pap- 04/02/14 normal BMD- 12/01/10 normal Mammogram- 08/22/16 negative   Review of Systems  Constitutional: Negative.   HENT: Negative.   Eyes: Negative.   Respiratory: Positive for apnea and shortness of breath.   Cardiovascular: Negative.   Gastrointestinal: Positive for constipation.  Endocrine: Negative.   Genitourinary: Negative.   Musculoskeletal: Positive for arthralgias and myalgias.  Skin: Negative.   Allergic/Immunologic: Negative.   Neurological: Negative.   Hematological: Negative.   Psychiatric/Behavioral: Negative.     Social History      She  reports that she quit smoking about 12 years ago. Her smoking use included cigarettes. She has a 6.25 pack-year smoking history. she has never used smokeless tobacco. She reports that she drinks about 0.6 - 1.2 oz of alcohol per week. She reports that she does not use drugs.  She feels well.       Social History   Socioeconomic History  . Marital status: Married    Spouse name: None  . Number of children: 3  . Years of education: None  . Highest education level: Associate degree: occupational, Scientist, product/process developmenttechnical, or vocational program  Social Needs  . Financial resource strain: Not hard at all  . Food insecurity - worry:  Never true  . Food insecurity - inability: Never true  . Transportation needs - medical: No  . Transportation needs - non-medical: No  Occupational History  . None  Tobacco Use  . Smoking status: Former Smoker    Packs/day: 0.25    Years: 25.00    Pack years: 6.25    Types: Cigarettes    Last attempt to quit: 04/25/2005    Years since quitting: 12.2  . Smokeless tobacco: Never Used  Substance and Sexual Activity  . Alcohol use: Yes    Alcohol/week: 0.6 - 1.2 oz    Types: 1 - 2 Glasses of wine per week  . Drug use: No  . Sexual activity: None  Other Topics Concern  . None  Social History Narrative  . None    Past Medical History:  Diagnosis Date  . Allergic rhinitis   . CAD (coronary artery disease)   . Hyperlipidemia   . Hypertension   . Hypothyroidism   . LVH (left ventricular hypertrophy)      Patient Active Problem List   Diagnosis Date Noted  . Angina pectoris (HCC) 12/09/2014  . BP (high blood pressure) 12/09/2014  . Adiposity 12/09/2014  . Dyspnea 09/25/2014  . Abnormal respiratory rate 09/25/2014  . Acquired hypothyroidism 09/21/2014  . Allergic rhinitis 09/21/2014  . Arteriosclerosis of coronary artery 09/21/2014  . Essential (primary) hypertension 09/21/2014  . HLD (hyperlipidemia) 09/21/2014  . Cardiomegaly 09/21/2014  . Familial multiple lipoprotein-type hyperlipidemia 09/21/2014  . CAFL (chronic airflow limitation) (HCC) 08/06/2013  . Acid reflux 08/06/2013  .  Breath shortness 08/06/2013  . Disease of thyroid gland 08/06/2013  . Tobacco abuse 06/21/2013  . COPD, moderate (HCC) 05/24/2013    Past Surgical History:  Procedure Laterality Date  . ABDOMINAL HYSTERECTOMY     has left ovary  . APPENDECTOMY    . CARDIAC CATHETERIZATION  09/2005   had 1 stent and 3 balloons placed  . CORONARY ARTERY BYPASS GRAFT    . TONSILLECTOMY      Family History        Family Status  Relation Name Status  . Mother  Deceased  . Father  Deceased at age  29       from heart disease  . Sister  Alive  . Brother  Deceased at age 20       from MI  . Sister  Alive  . PGM  (Not Specified)  . Mat Aunt  (Not Specified)        Her family history includes Asthma in her sister; Breast cancer in her maternal aunt; Breast cancer (age of onset: 16) in her sister; Cancer in her sister; Cancer - Colon in her paternal grandmother; Coronary artery disease in her father; Diabetes in her mother; Heart attack in her brother and father; Hyperlipidemia in her sister; Hypertension in her sister; Scleroderma in her sister; Stroke in her mother; Thyroid disease in her sister.      Allergies  Allergen Reactions  . Adhesive [Tape]     Band-aids irritate skin     Current Outpatient Medications:  .  5-Hydroxytryptophan (5-HTP) 100 MG CAPS, Take 1 capsule by mouth at bedtime., Disp: , Rfl:  .  albuterol (PROAIR HFA) 108 (90 BASE) MCG/ACT inhaler, Inhale 2 puffs into the lungs every 6 (six) hours as needed for wheezing or shortness of breath., Disp: 3 Inhaler, Rfl: 3 .  aspirin 81 MG tablet, Take 81 mg by mouth daily., Disp: , Rfl:  .  atorvastatin (LIPITOR) 80 MG tablet, TAKE 1 TABLET BY MOUTH  DAILY, Disp: 90 tablet, Rfl: 3 .  azelastine (ASTELIN) 0.1 % nasal spray, USE 2 SPRAYS IN EACH  NOSTRIL TWICE DAILY AS  DIRECTED (Patient taking differently: Place 2 sprays into both nostrils daily. USE 2 SPRAYS IN EACH  NOSTRIL TWICE DAILY AS  DIRECTED), Disp: 120 mL, Rfl: 11 .  Calcium Carbonate (CALCIUM 600 PO), Take 1 tablet by mouth daily., Disp: , Rfl:  .  cetirizine (ZYRTEC) 10 MG tablet, Take 10 mg by mouth daily as needed. , Disp: , Rfl:  .  glucosamine-chondroitin 500-400 MG tablet, Take 2 tablets by mouth daily., Disp: , Rfl:  .  levothyroxine (SYNTHROID, LEVOTHROID) 150 MCG tablet, TAKE 1 TABLET BY MOUTH DAILY BEFORE BREAKFAST, Disp: 90 tablet, Rfl: 0 .  montelukast (SINGULAIR) 10 MG tablet, Take 1 tablet (10 mg total) by mouth at bedtime., Disp: 90 tablet, Rfl:  2 .  Multiple Vitamin (MULTIVITAMIN) tablet, Take 1 tablet by mouth daily., Disp: , Rfl:  .  Omega-3 Fatty Acids (FISH OIL) 1000 MG CAPS, Take 4 capsules by mouth daily., Disp: , Rfl:  .  quinapril (ACCUPRIL) 20 MG tablet, TAKE 1 TABLET BY MOUTH  DAILY, Disp: 90 tablet, Rfl: 3 .  umeclidinium-vilanterol (ANORO ELLIPTA) 62.5-25 MCG/INH AEPB, Inhale 1 puff into the lungs daily., Disp: 180 each, Rfl: 2 .  zolpidem (AMBIEN) 5 MG tablet, Take 1 tablet (5 mg total) by mouth at bedtime as needed for sleep., Disp: 30 tablet, Rfl: 4 .  omeprazole (PRILOSEC) 20 MG capsule,  Take 1 capsule (20 mg total) by mouth daily. (Patient not taking: Reported on 07/12/2017), Disp: 90 capsule, Rfl: 3   Patient Care Team: Maple Hudson., MD as PCP - General (Family Medicine) Domingo Madeira, OD as Consulting Physician (Optometry) Alwyn Pea, MD as Consulting Physician (Cardiology) Shane Crutch, MD as Consulting Physician (Pulmonary Disease)      Objective:   Vitals: BP (!) 186/64 (BP Location: Left Arm)   Pulse 60   Temp 97.8 F (36.6 C) (Oral)   Ht 5\' 7"  (1.702 m)   Wt 171 lb 9.6 oz (77.8 kg)   BMI 26.88 kg/m    Vitals:   07/12/17 0905 07/12/17 0915  BP: (!) 192/68 (!) 186/64  Pulse: 60   Temp: 97.8 F (36.6 C)   TempSrc: Oral   Weight: 171 lb 9.6 oz (77.8 kg)   Height: 5\' 7"  (1.702 m)      Physical Exam  Constitutional: She is oriented to person, place, and time. She appears well-developed and well-nourished.  HENT:  Head: Normocephalic and atraumatic.  Right Ear: External ear normal.  Left Ear: External ear normal.  Nose: Nose normal.  Mouth/Throat: Oropharynx is clear and moist.  Eyes: No scleral icterus.  Neck: No thyromegaly present.  Cardiovascular: Normal rate, regular rhythm and normal heart sounds.  Pulmonary/Chest: Effort normal and breath sounds normal.  Abdominal: Soft.  Neurological: She is alert and oriented to person, place, and time.  Skin: Skin is  warm and dry.  Psychiatric: She has a normal mood and affect. Her behavior is normal. Judgment and thought content normal.     Depression Screen PHQ 2/9 Scores 07/12/2017 08/24/2016 08/24/2016 06/15/2015  PHQ - 2 Score 0 0 0 0  PHQ- 9 Score - 4 - -      Assessment & Plan:     Routine Health Maintenance and Physical Exam  Exercise Activities and Dietary recommendations Goals    None      Immunization History  Administered Date(s) Administered  . Influenza,inj,Quad PF,6+ Mos 03/20/2013  . Influenza-Unspecified 12/24/2013, 12/24/2013, 01/24/2015  . Pneumococcal Conjugate-13 03/20/2013  . Pneumococcal Polysaccharide-23 04/02/2014  . Pneumococcal-Unspecified 03/24/2013  . Td 07/10/2003    Health Maintenance  Topic Date Due  . TETANUS/TDAP  07/09/2013  . MAMMOGRAM  08/23/2018  . COLONOSCOPY  11/21/2019  . INFLUENZA VACCINE  Completed  . DEXA SCAN  Completed  . Hepatitis C Screening  Completed  . PNA vac Low Risk Adult  Completed    Refer for colonoscopy--5 year f/u. Discussed health benefits of physical activity, and encouraged her to engage in regular exercise appropriate for her age and condition.    --------------------------------------------------------------------    [name], MD  [facility]

## 2017-07-13 ENCOUNTER — Telehealth: Payer: Self-pay

## 2017-07-13 DIAGNOSIS — E0590 Thyrotoxicosis, unspecified without thyrotoxic crisis or storm: Secondary | ICD-10-CM

## 2017-07-13 LAB — COMPREHENSIVE METABOLIC PANEL
ALK PHOS: 95 IU/L (ref 39–117)
ALT: 24 IU/L (ref 0–32)
AST: 31 IU/L (ref 0–40)
Albumin/Globulin Ratio: 1.4 (ref 1.2–2.2)
Albumin: 4.2 g/dL (ref 3.6–4.8)
BILIRUBIN TOTAL: 0.8 mg/dL (ref 0.0–1.2)
BUN/Creatinine Ratio: 22 (ref 12–28)
BUN: 14 mg/dL (ref 8–27)
CHLORIDE: 103 mmol/L (ref 96–106)
CO2: 23 mmol/L (ref 20–29)
CREATININE: 0.63 mg/dL (ref 0.57–1.00)
Calcium: 9.5 mg/dL (ref 8.7–10.3)
GFR calc Af Amer: 106 mL/min/{1.73_m2} (ref >59)
GFR calc non Af Amer: 92 mL/min/{1.73_m2} (ref >59)
GLOBULIN, TOTAL: 2.9 g/dL (ref 1.5–4.5)
GLUCOSE: 103 mg/dL — AB (ref 65–99)
POTASSIUM: 4.2 mmol/L (ref 3.5–5.2)
SODIUM: 142 mmol/L (ref 134–144)
Total Protein: 7.1 g/dL (ref 6.0–8.5)

## 2017-07-13 LAB — LIPID PANEL
CHOL/HDL RATIO: 2.1 ratio (ref 0.0–4.4)
Cholesterol, Total: 162 mg/dL (ref 100–199)
HDL: 77 mg/dL (ref >39)
LDL CALC: 72 mg/dL (ref 0–99)
TRIGLYCERIDES: 64 mg/dL (ref 0–149)
VLDL CHOLESTEROL CAL: 13 mg/dL (ref 5–40)

## 2017-07-13 LAB — CBC WITH DIFFERENTIAL/PLATELET
Basophils Absolute: 0 10*3/uL (ref 0.0–0.2)
Basos: 0 %
EOS (ABSOLUTE): 0.2 10*3/uL (ref 0.0–0.4)
EOS: 4 %
HEMATOCRIT: 38.4 % (ref 34.0–46.6)
HEMOGLOBIN: 12.8 g/dL (ref 11.1–15.9)
Immature Grans (Abs): 0 10*3/uL (ref 0.0–0.1)
Immature Granulocytes: 0 %
Lymphocytes Absolute: 2.1 10*3/uL (ref 0.7–3.1)
Lymphs: 38 %
MCH: 31.9 pg (ref 26.6–33.0)
MCHC: 33.3 g/dL (ref 31.5–35.7)
MCV: 96 fL (ref 79–97)
MONOCYTES: 6 %
Monocytes Absolute: 0.3 10*3/uL (ref 0.1–0.9)
NEUTROS ABS: 2.8 10*3/uL (ref 1.4–7.0)
Neutrophils: 52 %
Platelets: 210 10*3/uL (ref 150–379)
RBC: 4.01 x10E6/uL (ref 3.77–5.28)
RDW: 13.1 % (ref 12.3–15.4)
WBC: 5.4 10*3/uL (ref 3.4–10.8)

## 2017-07-13 LAB — TSH: TSH: 0.02 u[IU]/mL — ABNORMAL LOW (ref 0.450–4.500)

## 2017-07-13 NOTE — Telephone Encounter (Signed)
-----   Message from Maple Hudsonichard L Gilbert Jr., MD sent at 07/13/2017 10:37 AM EDT ----- Labs ok but pt hyperthyroid--would refer to Endocrinology--

## 2017-07-13 NOTE — Telephone Encounter (Signed)
Patient is returning a call to [place].

## 2017-07-13 NOTE — Telephone Encounter (Signed)
[id number] 

## 2017-07-14 NOTE — Telephone Encounter (Signed)
LMTCB ED 

## 2017-07-17 NOTE — Telephone Encounter (Signed)
Pt advised.  Please refer.   Thanks,   -Laura  

## 2017-07-26 ENCOUNTER — Ambulatory Visit: Payer: Medicare HMO

## 2017-07-26 VITALS — BP 162/62

## 2017-07-26 DIAGNOSIS — I10 Essential (primary) hypertension: Secondary | ICD-10-CM

## 2017-07-26 MED ORDER — QUINAPRIL HCL 40 MG PO TABS: 40.0000 mg | ORAL_TABLET | Freq: Every day | ORAL | 1 refills | Status: DC

## 2017-07-26 NOTE — Progress Notes (Signed)
Patient comes in today for a BP check. She also brought in her BP cuff for calibration. She denies any headaches, swelling, blurred vision, or chest pain.  However, she does mention that she has had a URI for the last 7 days. She reports that she has cough and congestion which is relieved by Mucinex and Alker seltzer plus at bedtime.   Her BP readings on her home cuff was compatible to ours. Today in the office, her BP reading was 162/62. Her BP cuff was 159/53. Per Dr. Sullivan LoneGilbert, patient is to increase Quinapril to 40mg  daily, and schedule a follow up in 4 weeks. Also, patient is to monitor her URI symptoms and if it worsens to call the office and let us know.

## 2017-07-28 ENCOUNTER — Encounter: Payer: Self-pay | Admitting: Family Medicine

## 2017-08-03 ENCOUNTER — Other Ambulatory Visit: Payer: Self-pay | Admitting: Internal Medicine

## 2017-08-23 ENCOUNTER — Other Ambulatory Visit: Payer: Self-pay | Admitting: Family Medicine

## 2017-08-23 DIAGNOSIS — I10 Essential (primary) hypertension: Secondary | ICD-10-CM

## 2017-08-24 ENCOUNTER — Ambulatory Visit
Admission: RE | Admit: 2017-08-24 | Discharge: 2017-08-24 | Disposition: A | Discharge status: Home / Self Care | Payer: Medicare HMO | Source: Ambulatory Visit | Attending: Family Medicine | Admitting: Family Medicine

## 2017-08-24 DIAGNOSIS — Z1231 Encounter for screening mammogram for malignant neoplasm of breast: Secondary | ICD-10-CM | POA: Insufficient documentation

## 2017-08-24 DIAGNOSIS — Z1239 Encounter for other screening for malignant neoplasm of breast: Secondary | ICD-10-CM

## 2017-08-25 ENCOUNTER — Telehealth: Payer: Self-pay

## 2017-08-25 ENCOUNTER — Other Ambulatory Visit: Payer: Self-pay | Admitting: Family Medicine

## 2017-08-25 DIAGNOSIS — N6310 Unspecified lump in the right breast, unspecified quadrant: Secondary | ICD-10-CM

## 2017-08-25 DIAGNOSIS — R928 Other abnormal and inconclusive findings on diagnostic imaging of breast: Secondary | ICD-10-CM

## 2017-08-25 NOTE — Telephone Encounter (Signed)
Is her heart still racing?  She needs an EKG if so.  Unfortunately I just got this message so we won't be able to get her in today.  She may need to go to the urgent care tonight.  Erasmo Downer, MD, MPH [facility], [facility], [facility] 08/25/2017 4:05 PM

## 2017-08-25 NOTE — Telephone Encounter (Signed)
Patient reports that her HR is down to 88. She is asymptomatic. Appt was scheduled to see Dr. Reece Agar on Monday. Patient will continue to monitor over the weekend. Patient was advised to go to the ER if symptoms worsen.

## 2017-08-25 NOTE — Telephone Encounter (Signed)
Patient called saying that she is experiencing an elevated HR all morning. She reports that her pulse is always below 60bpm, and she just checked it and her resting HR currently is at 120bpm. She reports that she worked out earlier this morning around 9am for about 1 hour. She reports that when she works out, her pulse is never over 100.   She denies any chest pain or pressure, shortness of breath with or without exertion, numbness or tingling in her extremities, upper back pain, nausea, or headache. She reports that when she checked her BP this morning it was 125/79.   Patient reports that it has been over 2 hours since her workout, and her HR is still elevated. Is this normal? Should the patient be seen? Please advise. Thanks!

## 2017-08-28 ENCOUNTER — Ambulatory Visit (INDEPENDENT_AMBULATORY_CARE_PROVIDER_SITE_OTHER): Payer: Medicare HMO | Admitting: Family Medicine

## 2017-08-28 ENCOUNTER — Encounter: Payer: Self-pay | Admitting: Family Medicine

## 2017-08-28 VITALS — BP 158/62 | HR 50 | Temp 98.1°F | Resp 16 | Wt 175.0 lb

## 2017-08-28 DIAGNOSIS — R002 Palpitations: Secondary | ICD-10-CM

## 2017-08-28 DIAGNOSIS — R000 Tachycardia, unspecified: Secondary | ICD-10-CM | POA: Diagnosis not present

## 2017-08-28 DIAGNOSIS — E079 Disorder of thyroid, unspecified: Secondary | ICD-10-CM | POA: Diagnosis not present

## 2017-08-28 DIAGNOSIS — I2510 Atherosclerotic heart disease of native coronary artery without angina pectoris: Secondary | ICD-10-CM

## 2017-08-28 NOTE — Progress Notes (Signed)
Patient: Penny Reed Female    DOB: 1945/06/11   72 y.o.   MRN: [id number] Visit Date: 08/28/2017  Today's Provider: [name], MD   Chief Complaint  Patient presents with  . Tachycardia   Subjective:    HPI Pt is here today for elevated heart rate she had only one episode on Friday where her heart rate was 120-130. She has exercised that morning and she noticed that it was not going on down. She did not have any other symptoms during that time. She has not had any more episodes of this. Her heart rate had been 50-60 since and that is what it normally runs. She reports that she has not felt different since the episode however she has been anxious. She denies chest pain. She does reports that she has been a little light headed but it comes on quickly and goes quickly. Pt has started taking different supplements as a part of a study.   She has no CP/SOB/DOE.  Allergies  Allergen Reactions  . Adhesive [Tape]     Band-aids irritate skin     Current Outpatient Medications:  .  aspirin 81 MG tablet, Take 81 mg by mouth daily., Disp: , Rfl:  .  atorvastatin (LIPITOR) 80 MG tablet, TAKE 1 TABLET BY MOUTH  DAILY, Disp: 90 tablet, Rfl: 3 .  azelastine (ASTELIN) 0.1 % nasal spray, USE 2 SPRAYS IN EACH  NOSTRIL TWICE DAILY AS  DIRECTED (Patient taking differently: Place 2 sprays into both nostrils daily. USE 2 SPRAYS IN EACH  NOSTRIL TWICE DAILY AS  DIRECTED), Disp: 120 mL, Rfl: 11 .  Calcium Carbonate (CALCIUM 600 PO), Take 1 tablet by mouth daily., Disp: , Rfl:  .  levothyroxine (SYNTHROID, LEVOTHROID) 150 MCG tablet, TAKE 1 TABLET BY MOUTH DAILY BEFORE BREAKFAST (Patient taking differently: 125 mcg TAKE 1 TABLET BY MOUTH DAILY BEFORE BREAKFAST), Disp: 90 tablet, Rfl: 0 .  Multiple Vitamin (MULTIVITAMIN) tablet, Take 1 tablet by mouth daily., Disp: , Rfl:  .  Omega-3 Fatty Acids (FISH OIL) 1000 MG CAPS, Take 4 capsules by mouth daily., Disp: , Rfl:  .  omeprazole (PRILOSEC)  20 MG capsule, Take 1 capsule (20 mg total) by mouth daily., Disp: 90 capsule, Rfl: 3 .  quinapril (ACCUPRIL) 40 MG tablet, TAKE 1 TABLET BY MOUTH EVERY DAY, Disp: 30 tablet, Rfl: 11 .  umeclidinium-vilanterol (ANORO ELLIPTA) 62.5-25 MCG/INH AEPB, Inhale 1 puff into the lungs daily., Disp: 180 each, Rfl: 2 .  zolpidem (AMBIEN) 5 MG tablet, Take 1 tablet (5 mg total) by mouth at bedtime as needed for sleep., Disp: 30 tablet, Rfl: 4 .  5-Hydroxytryptophan (5-HTP) 100 MG CAPS, Take 1 capsule by mouth at bedtime., Disp: , Rfl:  .  albuterol (PROAIR HFA) 108 (90 BASE) MCG/ACT inhaler, Inhale 2 puffs into the lungs every 6 (six) hours as needed for wheezing or shortness of breath., Disp: 3 Inhaler, Rfl: 3 .  cetirizine (ZYRTEC) 10 MG tablet, Take 10 mg by mouth daily as needed. , Disp: , Rfl:  .  glucosamine-chondroitin 500-400 MG tablet, Take 2 tablets by mouth daily., Disp: , Rfl:  .  montelukast (SINGULAIR) 10 MG tablet, Take 1 tablet (10 mg total) by mouth at bedtime. (Patient not taking: Reported on 08/28/2017), Disp: 90 tablet, Rfl: 2  Review of Systems  Constitutional: Negative.   HENT: Negative.   Eyes: Negative.   Respiratory: Negative.   Cardiovascular: Negative.  Tachycardia   Gastrointestinal: Negative.   Endocrine: Negative.   Genitourinary: Negative.   Musculoskeletal: Negative.   Skin: Negative.   Allergic/Immunologic: Negative.   Neurological: Positive for light-headedness.  Hematological: Negative.   Psychiatric/Behavioral: Negative.     Social History   Tobacco Use  . Smoking status: Former Smoker    Packs/day: 0.25    Years: 25.00    Pack years: 6.25    Types: Cigarettes    Last attempt to quit: 04/25/2005    Years since quitting: 12.3  . Smokeless tobacco: Never Used  Substance Use Topics  . Alcohol use: Yes    Alcohol/week: 0.6 - 1.2 oz    Types: 1 - 2 Glasses of wine per week   Objective:   BP (!) 158/62 (BP Location: Left Arm, Patient Position: Sitting,  Cuff Size: Normal)   Pulse (!) 50   Temp 98.1 F (36.7 C) (Oral)   Resp 16   Wt 175 lb (79.4 kg)   SpO2 99%   BMI 27.41 kg/m  Vitals:   08/28/17 1136  BP: (!) 158/62  Pulse: (!) 50  Resp: 16  Temp: 98.1 F (36.7 C)  TempSrc: Oral  SpO2: 99%  Weight: 175 lb (79.4 kg)     Physical Exam  Constitutional: She is oriented to person, place, and time. She appears well-developed and well-nourished.  HENT:  Head: Normocephalic and atraumatic.  Eyes: Conjunctivae are normal. No scleral icterus.  Neck: No thyromegaly present.  Cardiovascular: Normal rate, regular rhythm and normal heart sounds.  Pulmonary/Chest: Effort normal and breath sounds normal.  Abdominal: Soft.  Musculoskeletal: She exhibits no edema.  Neurological: She is alert and oriented to person, place, and time.  Skin: Skin is warm and dry.  Psychiatric: She has a normal mood and affect. Her behavior is normal. Judgment and thought content normal.        Assessment & Plan:     1. Tachycardia  - EKG 12-Lead  2. Thyroid disorder  - Comprehensive metabolic panel - TSH  3. Palpitations Refer due to Tachy/brady swings - TSH - CBC with Differential/Platelet - Ambulatory referral to Cardiology  4. Coronary artery disease involving native heart without angina pectoris, unspecified vessel or lesion type  - Comprehensive metabolic panel      I have done the exam and reviewed the above chart and it is accurate to the best of my knowledge. Dentist has been used in this note in any air is in the dictation or transcription are unintentional.  [name], MD  [facility]

## 2017-08-29 LAB — COMPREHENSIVE METABOLIC PANEL
ALBUMIN: 4.5 g/dL (ref 3.5–4.8)
ALT: 20 IU/L (ref 0–32)
AST: 27 IU/L (ref 0–40)
Albumin/Globulin Ratio: 1.7 (ref 1.2–2.2)
Alkaline Phosphatase: 90 IU/L (ref 39–117)
BUN / CREAT RATIO: 27 (ref 12–28)
BUN: 20 mg/dL (ref 8–27)
Bilirubin Total: 0.6 mg/dL (ref 0.0–1.2)
CO2: 23 mmol/L (ref 20–29)
CREATININE: 0.74 mg/dL (ref 0.57–1.00)
Calcium: 9.7 mg/dL (ref 8.7–10.3)
Chloride: 102 mmol/L (ref 96–106)
GFR, EST AFRICAN AMERICAN: 95 mL/min/{1.73_m2} (ref >59)
GFR, EST NON AFRICAN AMERICAN: 82 mL/min/{1.73_m2} (ref >59)
GLOBULIN, TOTAL: 2.6 g/dL (ref 1.5–4.5)
GLUCOSE: 100 mg/dL — AB (ref 65–99)
Potassium: 4.4 mmol/L (ref 3.5–5.2)
SODIUM: 141 mmol/L (ref 134–144)
TOTAL PROTEIN: 7.1 g/dL (ref 6.0–8.5)

## 2017-08-29 LAB — CBC WITH DIFFERENTIAL/PLATELET
Basophils Absolute: 0 10*3/uL (ref 0.0–0.2)
Basos: 0 %
EOS (ABSOLUTE): 0.1 10*3/uL (ref 0.0–0.4)
EOS: 3 %
HEMATOCRIT: 39.9 % (ref 34.0–46.6)
Hemoglobin: 13.2 g/dL (ref 11.1–15.9)
IMMATURE GRANS (ABS): 0 10*3/uL (ref 0.0–0.1)
IMMATURE GRANULOCYTES: 0 %
LYMPHS: 38 %
Lymphocytes Absolute: 1.9 10*3/uL (ref 0.7–3.1)
MCH: 31.1 pg (ref 26.6–33.0)
MCHC: 33.1 g/dL (ref 31.5–35.7)
MCV: 94 fL (ref 79–97)
MONOS ABS: 0.3 10*3/uL (ref 0.1–0.9)
Monocytes: 5 %
NEUTROS ABS: 2.8 10*3/uL (ref 1.4–7.0)
Neutrophils: 54 %
PLATELETS: 210 10*3/uL (ref 150–379)
RBC: 4.25 x10E6/uL (ref 3.77–5.28)
RDW: 13.5 % (ref 12.3–15.4)
WBC: 5.1 10*3/uL (ref 3.4–10.8)

## 2017-08-29 LAB — TSH: TSH: 0.027 u[IU]/mL — ABNORMAL LOW (ref 0.450–4.500)

## 2017-09-01 ENCOUNTER — Telehealth: Payer: Self-pay

## 2017-09-01 NOTE — Telephone Encounter (Signed)
Patient has been advised. KW 

## 2017-09-01 NOTE — Telephone Encounter (Signed)
-----   Message from Maple Hudson., MD sent at 08/29/2017  2:55 PM EDT ----- Cut synthroid dose in half.

## 2017-09-05 ENCOUNTER — Ambulatory Visit
Admission: RE | Admit: 2017-09-05 | Discharge: 2017-09-05 | Disposition: A | Discharge status: Home / Self Care | Payer: Medicare HMO | Source: Ambulatory Visit | Attending: Family Medicine | Admitting: Family Medicine

## 2017-09-05 DIAGNOSIS — R928 Other abnormal and inconclusive findings on diagnostic imaging of breast: Secondary | ICD-10-CM

## 2017-09-05 DIAGNOSIS — N6310 Unspecified lump in the right breast, unspecified quadrant: Secondary | ICD-10-CM

## 2017-09-06 ENCOUNTER — Telehealth: Payer: Self-pay | Admitting: Family Medicine

## 2017-09-06 ENCOUNTER — Other Ambulatory Visit: Payer: Self-pay | Admitting: Family Medicine

## 2017-09-06 DIAGNOSIS — R011 Cardiac murmur, unspecified: Secondary | ICD-10-CM | POA: Diagnosis not present

## 2017-09-06 DIAGNOSIS — R0602 Shortness of breath: Secondary | ICD-10-CM | POA: Diagnosis not present

## 2017-09-06 DIAGNOSIS — I2510 Atherosclerotic heart disease of native coronary artery without angina pectoris: Secondary | ICD-10-CM | POA: Diagnosis not present

## 2017-09-06 DIAGNOSIS — R001 Bradycardia, unspecified: Secondary | ICD-10-CM | POA: Diagnosis not present

## 2017-09-06 DIAGNOSIS — I208 Other forms of angina pectoris: Secondary | ICD-10-CM | POA: Diagnosis not present

## 2017-09-06 DIAGNOSIS — I10 Essential (primary) hypertension: Secondary | ICD-10-CM | POA: Diagnosis not present

## 2017-09-06 DIAGNOSIS — E7849 Other hyperlipidemia: Secondary | ICD-10-CM | POA: Diagnosis not present

## 2017-09-06 DIAGNOSIS — J449 Chronic obstructive pulmonary disease, unspecified: Secondary | ICD-10-CM | POA: Diagnosis not present

## 2017-09-06 DIAGNOSIS — R002 Palpitations: Secondary | ICD-10-CM | POA: Diagnosis not present

## 2017-09-06 DIAGNOSIS — K219 Gastro-esophageal reflux disease without esophagitis: Secondary | ICD-10-CM | POA: Diagnosis not present

## 2017-09-06 NOTE — Telephone Encounter (Signed)
Betsy at [facility] cardio dept wants a copy of the last EKG pt had in our office.  She was at [facility] this morning and they wanted to do an EKG but pt declined saying she just had one with DR, Sullivan Lone. Recently.  Their fax number is [phone]  Thanks teri

## 2017-09-06 NOTE — Telephone Encounter (Signed)
Printed to be faxed

## 2017-09-26 DIAGNOSIS — E0590 Thyrotoxicosis, unspecified without thyrotoxic crisis or storm: Secondary | ICD-10-CM | POA: Diagnosis not present

## 2017-10-22 ENCOUNTER — Other Ambulatory Visit: Payer: Self-pay | Admitting: Internal Medicine

## 2017-10-23 ENCOUNTER — Other Ambulatory Visit: Payer: Self-pay | Admitting: Family Medicine

## 2017-11-23 DIAGNOSIS — R69 Illness, unspecified: Secondary | ICD-10-CM | POA: Diagnosis not present

## 2017-12-22 ENCOUNTER — Other Ambulatory Visit: Payer: Self-pay | Admitting: Family Medicine

## 2017-12-22 NOTE — Telephone Encounter (Signed)
Pharmacy requesting refills. Thanks!  

## 2017-12-29 DIAGNOSIS — E0590 Thyrotoxicosis, unspecified without thyrotoxic crisis or storm: Secondary | ICD-10-CM | POA: Diagnosis not present

## 2018-01-02 DIAGNOSIS — R69 Illness, unspecified: Secondary | ICD-10-CM | POA: Diagnosis not present

## 2018-01-03 DIAGNOSIS — H2513 Age-related nuclear cataract, bilateral: Secondary | ICD-10-CM | POA: Diagnosis not present

## 2018-01-03 DIAGNOSIS — H52223 Regular astigmatism, bilateral: Secondary | ICD-10-CM | POA: Diagnosis not present

## 2018-01-03 DIAGNOSIS — H25013 Cortical age-related cataract, bilateral: Secondary | ICD-10-CM | POA: Diagnosis not present

## 2018-01-03 DIAGNOSIS — H5203 Hypermetropia, bilateral: Secondary | ICD-10-CM | POA: Diagnosis not present

## 2018-01-03 DIAGNOSIS — H524 Presbyopia: Secondary | ICD-10-CM | POA: Diagnosis not present

## 2018-01-03 DIAGNOSIS — E039 Hypothyroidism, unspecified: Secondary | ICD-10-CM | POA: Diagnosis not present

## 2018-01-24 ENCOUNTER — Telehealth: Payer: Self-pay | Admitting: Family Medicine

## 2018-01-24 ENCOUNTER — Encounter: Payer: Self-pay | Admitting: Family Medicine

## 2018-01-24 ENCOUNTER — Ambulatory Visit (INDEPENDENT_AMBULATORY_CARE_PROVIDER_SITE_OTHER): Payer: Medicare HMO | Admitting: Family Medicine

## 2018-01-24 VITALS — BP 132/68 | HR 46 | Resp 16 | Ht 67.0 in | Wt 179.0 lb

## 2018-01-24 DIAGNOSIS — I2510 Atherosclerotic heart disease of native coronary artery without angina pectoris: Secondary | ICD-10-CM | POA: Diagnosis not present

## 2018-01-24 DIAGNOSIS — G4700 Insomnia, unspecified: Secondary | ICD-10-CM

## 2018-01-24 DIAGNOSIS — E7800 Pure hypercholesterolemia, unspecified: Secondary | ICD-10-CM

## 2018-01-24 DIAGNOSIS — I10 Essential (primary) hypertension: Secondary | ICD-10-CM

## 2018-01-24 MED ORDER — ZOLPIDEM TARTRATE 5 MG PO TABS: 5.0000 mg | ORAL_TABLET | Freq: Every evening | ORAL | 4 refills | Status: DC | PRN

## 2018-01-24 NOTE — Telephone Encounter (Signed)
Dr Sullivan Lone notified and will sign Rx

## 2018-01-24 NOTE — Telephone Encounter (Signed)
Pt stated she had OV today 01/24/18 with Dr. Sullivan Lone and was advised the Rx for zolpidem (AMBIEN) 5 MG tablet was being sent to [facility] [address] while she was in the office. Pt stated that she wen to pharmacy to pick up the medication and was advised they had not received the Rx. Pt is requesting the Rx be sent today if possible. Please advise. Thanks TNP

## 2018-01-24 NOTE — Progress Notes (Signed)
Patient: Penny Reed Female    DOB: Jul 05, 1945   72 y.o.   MRN: [id number] Visit Date: 01/24/2018  Today's Provider: [name], MD   Chief Complaint  Patient presents with  . Follow-up   Subjective:    HPI Patient comes in today for a 6 month follow up. Since her last visit, she has been seen by endocrinology (Dr. Tedd Sias), and reports that she increased her levothyroxine to daily and 1 1/2 tablet (112+56) on Sundays. Patient reports that she has been tolerating med changes well.   Patient reports that she still has episodes of tachycardia that comes and goes. She reports that she has seen Cardiology for this, and they will continue to monitor.  BP Readings from Last 3 Encounters:  01/24/18 132/68  08/28/17 (!) 158/62  07/26/17 (!) 162/62    She also mentions that she received a Tdap vaccine and Flu vaccine a few weeks ago, and reports having a slight reaction. Patient reports that she had body aches, fever, and headaches. Symptoms were resolved within the next few days.     Allergies  Allergen Reactions  . Adhesive [Tape]     Band-aids irritate skin     Current Outpatient Medications:  .  5-Hydroxytryptophan (5-HTP) 100 MG CAPS, Take 1 capsule by mouth at bedtime., Disp: , Rfl:  .  albuterol (PROAIR HFA) 108 (90 BASE) MCG/ACT inhaler, Inhale 2 puffs into the lungs every 6 (six) hours as needed for wheezing or shortness of breath., Disp: 3 Inhaler, Rfl: 3 .  ANORO ELLIPTA 62.5-25 MCG/INH AEPB, INHALE 1 PUFF INTO THE LUNGS DAILY, Disp: 120 each, Rfl: 3 .  aspirin 81 MG tablet, Take 81 mg by mouth daily., Disp: , Rfl:  .  atorvastatin (LIPITOR) 80 MG tablet, TAKE 1 TABLET BY MOUTH EVERY DAY, Disp: 90 tablet, Rfl: 3 .  azelastine (ASTELIN) 0.1 % nasal spray, Place 2 sprays into both nostrils daily. USE 2 SPRAYS IN EACH  NOSTRIL TWICE DAILY AS  DIRECTED, Disp: 16 mL, Rfl: 11 .  Calcium Carbonate (CALCIUM 600 PO), Take 1 tablet by mouth daily., Disp: ,  Rfl:  .  cetirizine (ZYRTEC) 10 MG tablet, Take 10 mg by mouth daily as needed. , Disp: , Rfl:  .  glucosamine-chondroitin 500-400 MG tablet, Take 2 tablets by mouth daily., Disp: , Rfl:  .  levothyroxine (SYNTHROID, LEVOTHROID) 150 MCG tablet, TAKE 1 TABLET BY MOUTH DAILY BEFORE BREAKFAST (Patient taking differently: 125 mcg TAKE 1 TABLET BY MOUTH DAILY BEFORE BREAKFAST), Disp: 90 tablet, Rfl: 0 .  Multiple Vitamin (MULTIVITAMIN) tablet, Take 1 tablet by mouth daily., Disp: , Rfl:  .  Omega-3 Fatty Acids (FISH OIL) 1000 MG CAPS, Take 4 capsules by mouth daily., Disp: , Rfl:  .  omeprazole (PRILOSEC) 20 MG capsule, Take 1 capsule (20 mg total) by mouth daily., Disp: 90 capsule, Rfl: 3 .  quinapril (ACCUPRIL) 40 MG tablet, TAKE 1 TABLET BY MOUTH EVERY DAY, Disp: 30 tablet, Rfl: 11 .  zolpidem (AMBIEN) 5 MG tablet, Take 1 tablet (5 mg total) by mouth at bedtime as needed for sleep., Disp: 30 tablet, Rfl: 4 .  montelukast (SINGULAIR) 10 MG tablet, Take 1 tablet (10 mg total) by mouth at bedtime. (Patient not taking: Reported on 08/28/2017), Disp: 90 tablet, Rfl: 2  Review of Systems  Constitutional: Positive for fatigue. Negative for activity change, appetite change, chills, diaphoresis, fever and unexpected weight change.  HENT: Negative.  Eyes: Negative.   Respiratory: Negative for cough and shortness of breath.   Cardiovascular: Positive for palpitations. Negative for chest pain and leg swelling.       Palpitations comes and goes. Only 1 other episode since 08/2017.   Endocrine: Negative for cold intolerance, heat intolerance, polydipsia, polyphagia and polyuria.  Musculoskeletal: Positive for arthralgias. Negative for back pain.  Skin: Negative.   Allergic/Immunologic: Negative for environmental allergies.  Neurological: Negative for dizziness, syncope, weakness, light-headedness and headaches.  Psychiatric/Behavioral: Negative.     Social History   Tobacco Use  . Smoking status: Former  Smoker    Packs/day: 0.25    Years: 25.00    Pack years: 6.25    Types: Cigarettes    Last attempt to quit: 04/25/2005    Years since quitting: 12.7  . Smokeless tobacco: Never Used  Substance Use Topics  . Alcohol use: Yes    Alcohol/week: 1.0 - 2.0 standard drinks    Types: 1 - 2 Glasses of wine per week   Objective:   BP 132/68 (BP Location: Left Arm, Patient Position: Sitting, Cuff Size: Normal)   Pulse (!) 46   Resp 16   Ht 5\' 7"  (1.702 m)   Wt 179 lb (81.2 kg)   SpO2 98%   BMI 28.04 kg/m  Vitals:   01/24/18 0812  BP: 132/68  Pulse: (!) 46  Resp: 16  SpO2: 98%  Weight: 179 lb (81.2 kg)  Height: 5\' 7"  (1.702 m)     Physical Exam  Constitutional: She is oriented to person, place, and time. She appears well-developed and well-nourished.  HENT:  Head: Normocephalic and atraumatic.  Eyes: Conjunctivae are normal. No scleral icterus.  Neck: No thyromegaly present.  Cardiovascular: Normal rate, regular rhythm and normal heart sounds.  Pulmonary/Chest: Effort normal and breath sounds normal.  Abdominal: Soft.  Musculoskeletal: She exhibits no edema.  Neurological: She is alert and oriented to person, place, and time.  Skin: Skin is warm and dry.  Psychiatric: She has a normal mood and affect. Her behavior is normal. Judgment and thought content normal.        Assessment & Plan:     1. Arteriosclerosis of coronary artery   2. Essential (primary) hypertension   3. Pure hypercholesterolemia   4. Insomnia, unspecified type RF Ambien       I have done the exam and reviewed the above chart and it is accurate to the best of my knowledge. Dentist has been used in this note in any air is in the dictation or transcription are unintentional.  [name], MD  [facility]

## 2018-01-26 DIAGNOSIS — G4700 Insomnia, unspecified: Secondary | ICD-10-CM | POA: Insufficient documentation

## 2018-02-28 DIAGNOSIS — K219 Gastro-esophageal reflux disease without esophagitis: Secondary | ICD-10-CM | POA: Diagnosis not present

## 2018-02-28 DIAGNOSIS — R011 Cardiac murmur, unspecified: Secondary | ICD-10-CM | POA: Diagnosis not present

## 2018-02-28 DIAGNOSIS — J449 Chronic obstructive pulmonary disease, unspecified: Secondary | ICD-10-CM | POA: Diagnosis not present

## 2018-02-28 DIAGNOSIS — I208 Other forms of angina pectoris: Secondary | ICD-10-CM | POA: Diagnosis not present

## 2018-02-28 DIAGNOSIS — I2510 Atherosclerotic heart disease of native coronary artery without angina pectoris: Secondary | ICD-10-CM | POA: Diagnosis not present

## 2018-02-28 DIAGNOSIS — R002 Palpitations: Secondary | ICD-10-CM | POA: Diagnosis not present

## 2018-02-28 DIAGNOSIS — I10 Essential (primary) hypertension: Secondary | ICD-10-CM | POA: Diagnosis not present

## 2018-02-28 DIAGNOSIS — R0602 Shortness of breath: Secondary | ICD-10-CM | POA: Diagnosis not present

## 2018-02-28 DIAGNOSIS — R001 Bradycardia, unspecified: Secondary | ICD-10-CM | POA: Diagnosis not present

## 2018-05-07 DIAGNOSIS — E039 Hypothyroidism, unspecified: Secondary | ICD-10-CM | POA: Diagnosis not present

## 2018-05-14 DIAGNOSIS — E039 Hypothyroidism, unspecified: Secondary | ICD-10-CM | POA: Diagnosis not present

## 2018-06-04 DIAGNOSIS — R69 Illness, unspecified: Secondary | ICD-10-CM | POA: Diagnosis not present

## 2018-06-25 ENCOUNTER — Other Ambulatory Visit: Payer: Self-pay | Admitting: Family Medicine

## 2018-07-18 ENCOUNTER — Ambulatory Visit: Payer: Self-pay

## 2018-08-06 ENCOUNTER — Encounter: Payer: Self-pay | Admitting: Family Medicine

## 2018-08-08 ENCOUNTER — Other Ambulatory Visit: Payer: Self-pay | Admitting: Family Medicine

## 2018-08-08 DIAGNOSIS — I10 Essential (primary) hypertension: Secondary | ICD-10-CM

## 2018-08-28 ENCOUNTER — Telehealth: Payer: Self-pay

## 2018-08-28 NOTE — Telephone Encounter (Signed)
Patient returning your call and reports that a telephone call for her medicare wellness works fine.

## 2018-08-31 NOTE — Telephone Encounter (Signed)
Noted, confirmed apt for 09/03/18 telephonically.

## 2018-09-03 ENCOUNTER — Ambulatory Visit (INDEPENDENT_AMBULATORY_CARE_PROVIDER_SITE_OTHER): Payer: Medicare HMO

## 2018-09-03 DIAGNOSIS — Z0000 Encounter for general adult medical examination without abnormal findings: Secondary | ICD-10-CM | POA: Diagnosis not present

## 2018-09-03 NOTE — Patient Instructions (Addendum)
Penny Reed , Thank you for taking time to come for your Medicare Wellness Visit. I appreciate your ongoing commitment to your health goals. Please review the following plan we discussed and let me know if I can assist you in the future.   Screening recommendations/referrals: Colonoscopy: Up to date, due 10/2019 Mammogram: Up to date, due 08/2019 Bone Density: Up to date, due 11/2020 Recommended yearly ophthalmology/optometry visit for glaucoma screening and checkup Recommended yearly dental visit for hygiene and checkup  Vaccinations: Influenza vaccine: Up to date Pneumococcal vaccine: Completed series Tdap vaccine: Up to date, due 01/03/2028 Shingles vaccine: Pt declines today.    Advanced directives: Please bring a copy of your POA (Power of Attorney) and/or Living Will to your next appointment.   Conditions/risks identified: Continue current diet plan of cutting out carbohydrates.   Next appointment: 09/05/18 @ 10:20 AM with Dr. Sullivan Lone.    Preventive Care 6 Years and Older, Female Preventive care refers to lifestyle choices and visits with your health care provider that can promote health and wellness. What does preventive care include?  A yearly physical exam. This is also called an annual well check.  Dental exams once or twice a year.  Routine eye exams. Ask your health care provider how often you should have your eyes checked.  Personal lifestyle choices, including:  Daily care of your teeth and gums.  Regular physical activity.  Eating a healthy diet.  Avoiding tobacco and drug use.  Limiting alcohol use.  Practicing safe sex.  Taking low-dose aspirin every day.  Taking vitamin and mineral supplements as recommended by your health care provider. What happens during an annual well check? The services and screenings done by your health care provider during your annual well check will depend on your age, overall health, lifestyle risk factors, and family  history of disease. Counseling  Your health care provider may ask you questions about your:  Alcohol use.  Tobacco use.  Drug use.  Emotional well-being.  Home and relationship well-being.  Sexual activity.  Eating habits.  History of falls.  Memory and ability to understand (cognition).  Work and work Astronomer.  Reproductive health. Screening  You may have the following tests or measurements:  Height, weight, and BMI.  Blood pressure.  Lipid and cholesterol levels. These may be checked every 5 years, or more frequently if you are over 29 years old.  Skin check.  Lung cancer screening. You may have this screening every year starting at age 41 if you have a 30-pack-year history of smoking and currently smoke or have quit within the past 15 years.  Fecal occult blood test (FOBT) of the stool. You may have this test every year starting at age 28.  Flexible sigmoidoscopy or colonoscopy. You may have a sigmoidoscopy every 5 years or a colonoscopy every 10 years starting at age 75.  Hepatitis C blood test.  Hepatitis B blood test.  Sexually transmitted disease (STD) testing.  Diabetes screening. This is done by checking your blood sugar (glucose) after you have not eaten for a while (fasting). You may have this done every 1-3 years.  Bone density scan. This is done to screen for osteoporosis. You may have this done starting at age 80.  Mammogram. This may be done every 1-2 years. Talk to your health care provider about how often you should have regular mammograms. Talk with your health care provider about your test results, treatment options, and if necessary, the need for more tests. Vaccines  Your health care provider may recommend certain vaccines, such as:  Influenza vaccine. This is recommended every year.  Tetanus, diphtheria, and acellular pertussis (Tdap, Td) vaccine. You may need a Td booster every 10 years.  Zoster vaccine. You may need this after  age 81.  Pneumococcal 13-valent conjugate (PCV13) vaccine. One dose is recommended after age 17.  Pneumococcal polysaccharide (PPSV23) vaccine. One dose is recommended after age 77. Talk to your health care provider about which screenings and vaccines you need and how often you need them. This information is not intended to replace advice given to you by your health care provider. Make sure you discuss any questions you have with your health care provider. Document Released: 05/08/2015 Document Revised: 12/30/2015 Document Reviewed: 02/10/2015 Elsevier Interactive Patient Education  2017 [place] Prevention in the Home Falls can cause injuries. They can happen to people of all ages. There are many things you can do to make your home safe and to help prevent falls. What can I do on the outside of my home?  Regularly fix the edges of walkways and driveways and fix any cracks.  Remove anything that might make you trip as you walk through a door, such as a raised step or threshold.  Trim any bushes or trees on the path to your home.  Use bright outdoor lighting.  Clear any walking paths of anything that might make someone trip, such as rocks or tools.  Regularly check to see if handrails are loose or broken. Make sure that both sides of any steps have handrails.  Any raised decks and porches should have guardrails on the edges.  Have any leaves, snow, or ice cleared regularly.  Use sand or salt on walking paths during winter.  Clean up any spills in your garage right away. This includes oil or grease spills. What can I do in the bathroom?  Use night lights.  Install grab bars by the toilet and in the tub and shower. Do not use towel bars as grab bars.  Use non-skid mats or decals in the tub or shower.  If you need to sit down in the shower, use a plastic, non-slip stool.  Keep the floor dry. Clean up any water that spills on the floor as soon as it happens.   Remove soap buildup in the tub or shower regularly.  Attach bath mats securely with double-sided non-slip rug tape.  Do not have throw rugs and other things on the floor that can make you trip. What can I do in the bedroom?  Use night lights.  Make sure that you have a light by your bed that is easy to reach.  Do not use any sheets or blankets that are too big for your bed. They should not hang down onto the floor.  Have a firm chair that has side arms. You can use this for support while you get dressed.  Do not have throw rugs and other things on the floor that can make you trip. What can I do in the kitchen?  Clean up any spills right away.  Avoid walking on wet floors.  Keep items that you use a lot in easy-to-reach places.  If you need to reach something above you, use a strong step stool that has a grab bar.  Keep electrical cords out of the way.  Do not use floor polish or wax that makes floors slippery. If you must use wax, use non-skid floor wax.  Do  not have throw rugs and other things on the floor that can make you trip. What can I do with my stairs?  Do not leave any items on the stairs.  Make sure that there are handrails on both sides of the stairs and use them. Fix handrails that are broken or loose. Make sure that handrails are as long as the stairways.  Check any carpeting to make sure that it is firmly attached to the stairs. Fix any carpet that is loose or worn.  Avoid having throw rugs at the top or bottom of the stairs. If you do have throw rugs, attach them to the floor with carpet tape.  Make sure that you have a light switch at the top of the stairs and the bottom of the stairs. If you do not have them, ask someone to add them for you. What else can I do to help prevent falls?  Wear shoes that:  Do not have high heels.  Have rubber bottoms.  Are comfortable and fit you well.  Are closed at the toe. Do not wear sandals.  If you use a  stepladder:  Make sure that it is fully opened. Do not climb a closed stepladder.  Make sure that both sides of the stepladder are locked into place.  Ask someone to hold it for you, if possible.  Clearly mark and make sure that you can see:  Any grab bars or handrails.  First and last steps.  Where the edge of each step is.  Use tools that help you move around (mobility aids) if they are needed. These include:  Canes.  Walkers.  Scooters.  Crutches.  Turn on the lights when you go into a dark area. Replace any light bulbs as soon as they burn out.  Set up your furniture so you have a clear path. Avoid moving your furniture around.  If any of your floors are uneven, fix them.  If there are any pets around you, be aware of where they are.  Review your medicines with your doctor. Some medicines can make you feel dizzy. This can increase your chance of falling. Ask your doctor what other things that you can do to help prevent falls. This information is not intended to replace advice given to you by your health care provider. Make sure you discuss any questions you have with your health care provider. Document Released: 02/05/2009 Document Revised: 09/17/2015 Document Reviewed: 05/16/2014 Elsevier Interactive Patient Education  2017 [employer].

## 2018-09-03 NOTE — Progress Notes (Signed)
Subjective:   Penny Reed is a 73 y.o. female who presents for Medicare Annual (Subsequent) preventive examination.    This visit is being conducted through telemedicine due to the COVID-19 pandemic. This patient has given me verbal consent via doximity to conduct this visit, patient states they are participating from their home address. Some vital signs may be absent or patient reported.    Patient identification: identified by name, DOB, and current address  Review of Systems:  N/A  Cardiac Risk Factors include: advanced age (>17men, >39 women);dyslipidemia;hypertension     Objective:     Vitals: There were no vitals taken for this visit.  There is no height or weight on file to calculate BMI. Unable to obtain vitals due to visit being conducted via telephonically.   Advanced Directives 09/03/2018 07/12/2017 11/07/2016 12/09/2014  Does Patient Have a Medical Advance Directive? Yes Yes Yes Yes  Type of Advance Directive Living will;Healthcare Power of [employer] Power of [place];Living will Living will Living will  Copy of Healthcare Power of Attorney in Chart? No - copy requested No - copy requested - -    Tobacco Social History   Tobacco Use  Smoking Status Former Smoker   Packs/day: 0.25   Years: 25.00   Pack years: 6.25   Types: Cigarettes   Last attempt to quit: 04/25/2005   Years since quitting: 13.3  Smokeless Tobacco Never Used     Counseling given: Not Answered   Clinical Intake:  Pre-visit preparation completed: Yes  Pain : No/denies pain Pain Score: 0-No pain     Nutritional Status: BMI 25 -29 Overweight Nutritional Risks: None Diabetes: No  How often do you need to have someone help you when you read instructions, pamphlets, or other written materials from your doctor or pharmacy?: 1 - Never  Interpreter Needed?: No  Information entered by :: Mmarkoski, LPN  Past Medical History:  Diagnosis Date   Allergic rhinitis     CAD (coronary artery disease)    Hyperlipidemia    Hypertension    Hypothyroidism    LVH (left ventricular hypertrophy)    Past Surgical History:  Procedure Laterality Date   ABDOMINAL HYSTERECTOMY     has left ovary   APPENDECTOMY     CARDIAC CATHETERIZATION  09/2005   had 1 stent and 3 balloons placed   CORONARY ARTERY BYPASS GRAFT     TONSILLECTOMY     Family History  Problem Relation Age of Onset   Diabetes Mother    Stroke Mother        cause of death   Coronary artery disease Father    Heart attack Father    Hyperlipidemia Sister    Hypertension Sister    Breast cancer Sister 38   Cancer Sister        breast cancer   Asthma Sister    Scleroderma Sister    Thyroid disease Sister    Heart attack Brother    Cancer - Colon Paternal Grandmother    Breast cancer Maternal Aunt        16s   Social History   Socioeconomic History   Marital status: Married    Spouse name: Not on file   Number of children: 3   Years of education: Not on file   Highest education level: Associate degree: occupational, Scientist, product/process development, or vocational program  Occupational History   Not on file  Social Needs   Financial resource strain: Not hard at [employer]  insecurity:    Worry: Never true    Inability: Never true   Transportation needs:    Medical: No    Non-medical: No  Tobacco Use   Smoking status: Former Smoker    Packs/day: 0.25    Years: 25.00    Pack years: 6.25    Types: Cigarettes    Last attempt to quit: 04/25/2005    Years since quitting: 13.3   Smokeless tobacco: Never Used  Substance and Sexual Activity   Alcohol use: Yes    Alcohol/week: 5.0 standard drinks    Types: 5 Glasses of wine per week   Drug use: No   Sexual activity: Not on file  Lifestyle   Physical activity:    Days per week: 6 days    Minutes per session: 60 min   Stress: Not at all  Relationships   Social connections:    Talks on phone: Patient refused     Gets together: Patient refused    Attends religious service: Patient refused    Active member of club or organization: Patient refused    Attends meetings of clubs or organizations: Patient refused    Relationship status: Patient refused  Other Topics Concern   Not on file  Social History Narrative   Not on file    Outpatient Encounter Medications as of 09/03/2018  Medication Sig   albuterol (PROAIR HFA) 108 (90 BASE) MCG/ACT inhaler Inhale 2 puffs into the lungs every 6 (six) hours as needed for wheezing or shortness of breath.   ANORO ELLIPTA 62.5-25 MCG/INH AEPB INHALE 1 PUFF INTO THE LUNGS DAILY   aspirin 81 MG tablet Take 81 mg by mouth daily.   atorvastatin (LIPITOR) 80 MG tablet TAKE 1 TABLET BY MOUTH EVERY DAY   azelastine (ASTELIN) 0.1 % nasal spray Place 2 sprays into both nostrils daily. USE 2 SPRAYS IN EACH  NOSTRIL TWICE DAILY AS  DIRECTED (Patient taking differently: Place 2 sprays into both nostrils daily. )   Calcium Carbonate (CALCIUM 600 PO) Take 1 tablet by mouth daily.   cetirizine (ZYRTEC) 10 MG tablet Take 10 mg by mouth daily as needed.    levothyroxine (SYNTHROID, LEVOTHROID) 150 MCG tablet TAKE 1 TABLET BY MOUTH DAILY BEFORE BREAKFAST (Patient taking differently: 125 mcg TAKE 1 TABLET BY MOUTH DAILY BEFORE BREAKFAST)   Multiple Vitamin (MULTIVITAMIN) tablet Take 1 tablet by mouth daily.   Omega-3 Fatty Acids (FISH OIL) 1000 MG CAPS Take 4 capsules by mouth daily.   psyllium (METAMUCIL) 0.52 g capsule Take by mouth daily.   quinapril (ACCUPRIL) 40 MG tablet TAKE 1 TABLET BY MOUTH EVERY DAY   zolpidem (AMBIEN) 5 MG tablet Take 1 tablet (5 mg total) by mouth at bedtime as needed for sleep.   5-Hydroxytryptophan (5-HTP) 100 MG CAPS Take 1 capsule by mouth at bedtime.   glucosamine-chondroitin 500-400 MG tablet Take 2 tablets by mouth daily.   montelukast (SINGULAIR) 10 MG tablet Take 1 tablet (10 mg total) by mouth at bedtime. (Patient not taking:  Reported on 08/28/2017)   omeprazole (PRILOSEC) 20 MG capsule Take 1 capsule (20 mg total) by mouth daily. (Patient not taking: Reported on 09/03/2018)   No facility-administered encounter medications on file as of 09/03/2018.     Activities of Daily Living In your present state of health, do you have any difficulty performing the following activities: 09/03/2018  Hearing? N  Vision? Y  Comment Due to cataracts.   Difficulty concentrating or making decisions? N  Walking  or climbing stairs? N  Dressing or bathing? N  Doing errands, shopping? N  Preparing Food and eating ? N  Using the Toilet? N  In the past six months, have you accidently leaked urine? N  Do you have problems with loss of bowel control? N  Managing your Medications? N  Managing your Finances? N  Housekeeping or managing your Housekeeping? N  Some recent data might be hidden    Patient Care Team: Maple HudsonGilbert, Richard L Jr., MD as PCP - General (Family Medicine) Domingo MadeiraNice, Keith B, OD as Consulting Physician (Optometry) Alwyn Peaallwood, Dwayne D, MD as Consulting Physician (Cardiology) Shane Crutchamachandran, Pradeep, MD as Consulting Physician (Pulmonary Disease) Tedd SiasSolum, Marlana SalvageAnna M, MD as Physician Assistant (Endocrinology)    Assessment:   This is a routine wellness examination for [place].  Exercise Activities and Dietary recommendations Current Exercise Habits: Home exercise routine, Type of exercise: strength training/weights;Other - see comments(cardio), Time (Minutes): 60, Frequency (Times/Week): 6, Weekly Exercise (Minutes/Week): 360, Exercise limited by: None identified  Goals     Cut out extra servings     Recommend to continue with weight watchers diet and cutting out carbohydrates.        Fall Risk: Fall Risk  09/03/2018 07/12/2017 08/24/2016 06/15/2015  Falls in the past year? 0 No No No    FALL RISK PREVENTION PERTAINING TO THE HOME:  Any stairs in or around the home? Yes  If so, are there any without handrails? Yes   Home  free of loose throw rugs in walkways, pet beds, electrical cords, etc? Yes  Adequate lighting in your home to reduce risk of falls? Yes   ASSISTIVE DEVICES UTILIZED TO PREVENT FALLS:  Life alert? No  Use of a cane, walker or w/c? No  Grab bars in the bathroom? No  Shower chair or bench in shower? No  Elevated toilet seat or a handicapped toilet? No    TIMED UP AND GO:  Was the test performed? No .    Depression Screen PHQ 2/9 Scores 09/03/2018 09/03/2018 07/12/2017 08/24/2016  PHQ - 2 Score 0 0 0 0  PHQ- 9 Score 0 - - 4     Cognitive Function: Declined today.      6CIT Screen 07/12/2017  What Year? 0 points  What month? 0 points  What time? 0 points  Count back from 20 0 points  Months in reverse 0 points  Repeat phrase 0 points  Total Score 0    Immunization History  Administered Date(s) Administered   Influenza, High Dose Seasonal PF 02/23/2017, 01/02/2018   Influenza,inj,Quad PF,6+ Mos 03/20/2013   Influenza-Unspecified 12/24/2013, 12/24/2013, 01/24/2015   Pneumococcal Conjugate-13 03/20/2013   Pneumococcal Polysaccharide-23 04/02/2014   Pneumococcal-Unspecified 03/24/2013   Td 07/10/2003   Tdap 01/02/2018    Qualifies for Shingles Vaccine? Yes . Due for Shingrix. Education has been provided regarding the importance of this vaccine. Pt has been advised to call insurance company to determine out of pocket expense. Advised may also receive vaccine at local pharmacy or Health Dept. Verbalized acceptance and understanding.  Tdap: Up to date  Flu Vaccine: Up to date  Pneumococcal Vaccine: Up to date  Screening Tests Health Maintenance  Topic Date Due   INFLUENZA VACCINE  11/24/2018   MAMMOGRAM  08/25/2019   COLONOSCOPY  11/21/2019   DEXA SCAN  11/30/2020   TETANUS/TDAP  01/03/2028   Hepatitis C Screening  Completed   PNA vac Low Risk Adult  Completed    Cancer Screenings:  Colorectal Screening:  Completed 11/20/09. Repeat every 10  years.  Mammogram: Up to date  Bone Density: Completed 12/01/10. Results reflect NORMAL. Repeat every 10 years.   Lung Cancer Screening: (Low Dose CT Chest recommended if Age 75-80 years, 30 pack-year currently smoking OR have quit w/in 15years.) does qualify. An Epic message has been sent to Glenna Fellows, RN (Oncology Nurse Navigator) regarding the possible need for this exam. Ines Bloomer will review the patient's chart to determine if the patient truly qualifies for the exam. If the patient qualifies, Ines Bloomer will order the Low Dose CT of the chest to facilitate the scheduling of this exam.  Additional Screening:  Hepatitis C Screening: Up to date  Vision Screening: Recommended annual ophthalmology exams for early detection of glaucoma and other disorders of the eye.  Dental Screening: Recommended annual dental exams for proper oral hygiene  Community Resource Referral:  CRR required this visit?  No      Plan:  I have personally reviewed and addressed the Medicare Annual Wellness questionnaire and have noted the following in the patients chart:  A. Medical and social history B. Use of alcohol, tobacco or illicit drugs  C. Current medications and supplements D. Functional ability and status E.  Nutritional status F.  Physical activity G. Advance directives H. List of other physicians I.  Hospitalizations, surgeries, and ER visits in previous 12 months J.  Vitals K. Screenings such as hearing and vision if needed, cognitive and depression L. Referrals and appointments   In addition, I have reviewed and discussed with patient certain preventive protocols, quality metrics, and best practice recommendations. A written personalized care plan for preventive services as well as general preventive health recommendations were provided to patient. Nurse Health Advisor  Signed,    Briselda Naval [place], [place]  [id number] Nurse Health Advisor   Nurse Notes: None.

## 2018-09-04 ENCOUNTER — Telehealth: Payer: Self-pay | Admitting: *Deleted

## 2018-09-04 NOTE — Telephone Encounter (Signed)
Received referral for lung screening, contacted patient to confirm that information was correct from contact last year. Patient verified that she does not meet the required 30 pack year history.

## 2018-09-05 ENCOUNTER — Encounter: Payer: Self-pay | Admitting: Family Medicine

## 2018-09-05 ENCOUNTER — Ambulatory Visit (INDEPENDENT_AMBULATORY_CARE_PROVIDER_SITE_OTHER): Payer: Medicare HMO | Admitting: Family Medicine

## 2018-09-05 ENCOUNTER — Other Ambulatory Visit: Payer: Self-pay

## 2018-09-05 VITALS — BP 120/70 | HR 49 | Temp 98.7°F | Resp 16 | Wt 174.2 lb

## 2018-09-05 DIAGNOSIS — E7800 Pure hypercholesterolemia, unspecified: Secondary | ICD-10-CM

## 2018-09-05 DIAGNOSIS — I10 Essential (primary) hypertension: Secondary | ICD-10-CM | POA: Diagnosis not present

## 2018-09-05 DIAGNOSIS — Z0000 Encounter for general adult medical examination without abnormal findings: Secondary | ICD-10-CM

## 2018-09-05 DIAGNOSIS — I2510 Atherosclerotic heart disease of native coronary artery without angina pectoris: Secondary | ICD-10-CM

## 2018-09-05 DIAGNOSIS — E079 Disorder of thyroid, unspecified: Secondary | ICD-10-CM | POA: Diagnosis not present

## 2018-09-05 NOTE — Progress Notes (Signed)
Patient: Penny Reed, Female    DOB: 13-Mar-1946, 73 y.o.   MRN: [id number] Visit Date: 09/05/2018  Today's Provider: Megan Mansichard Gilbert Jr, MD   Chief Complaint  Patient presents with  . Annual Exam   Subjective:     Annual physical exam Penny Reed is a 73 y.o. female who presents today for health maintenance and complete physical. She feels well. She reports exercising 6 days a week. She reports she is sleeping FAIRLY WELL with medication. Medication refill on Ambien 5 mg. Patient is experiencing tingling in both hands after waking up for the last 6 months. Patient is status post hysterectomy for uterine fibroids.  -----------------------------------------------------------------   Review of Systems  Constitutional: Negative.   HENT: Negative.   Eyes: Negative.   Respiratory: Negative.   Cardiovascular: Negative.        Tachycardia   Gastrointestinal: Negative.   Endocrine: Negative.   Genitourinary: Negative.   Musculoskeletal: Negative.   Skin: Negative.   Allergic/Immunologic: Negative.   Neurological: Positive for light-headedness.  Hematological: Negative.   Psychiatric/Behavioral: Negative.   All other systems reviewed and are negative.   Social History      She  reports that she quit smoking about 13 years ago. Her smoking use included cigarettes. She has a 6.25 pack-year smoking history. She has never used smokeless tobacco. She reports current alcohol use of about 5.0 standard drinks of alcohol per week. She reports that she does not use drugs.       Social History   Socioeconomic History  . Marital status: Married    Spouse name: Not on file  . Number of children: 3  . Years of education: Not on file  . Highest education level: Associate degree: occupational, Scientist, product/process developmenttechnical, or vocational program  Occupational History  . Not on file  Social Needs  . Financial resource strain: Not hard at all  . Food insecurity:    Worry: Never true   Inability: Never true  . Transportation needs:    Medical: No    Non-medical: No  Tobacco Use  . Smoking status: Former Smoker    Packs/day: 0.25    Years: 25.00    Pack years: 6.25    Types: Cigarettes    Last attempt to quit: 04/25/2005    Years since quitting: 13.3  . Smokeless tobacco: Never Used  Substance and Sexual Activity  . Alcohol use: Yes    Alcohol/week: 5.0 standard drinks    Types: 5 Glasses of wine per week  . Drug use: No  . Sexual activity: Not on file  Lifestyle  . Physical activity:    Days per week: 6 days    Minutes per session: 60 min  . Stress: Not at all  Relationships  . Social connections:    Talks on phone: Patient refused    Gets together: Patient refused    Attends religious service: Patient refused    Active member of club or organization: Patient refused    Attends meetings of clubs or organizations: Patient refused    Relationship status: Patient refused  Other Topics Concern  . Not on file  Social History Narrative  . Not on file    Past Medical History:  Diagnosis Date  . Allergic rhinitis   . CAD (coronary artery disease)   . Hyperlipidemia   . Hypertension   . Hypothyroidism   . LVH (left ventricular hypertrophy)      Patient Active Problem List  Diagnosis Date Noted  . Insomnia 01/26/2018  . Angina pectoris (HCC) 12/09/2014  . BP (high blood pressure) 12/09/2014  . Adiposity 12/09/2014  . Dyspnea 09/25/2014  . Abnormal respiratory rate 09/25/2014  . Acquired hypothyroidism 09/21/2014  . Allergic rhinitis 09/21/2014  . Arteriosclerosis of coronary artery 09/21/2014  . Essential (primary) hypertension 09/21/2014  . HLD (hyperlipidemia) 09/21/2014  . Cardiomegaly 09/21/2014  . Familial multiple lipoprotein-type hyperlipidemia 09/21/2014  . CAFL (chronic airflow limitation) (HCC) 08/06/2013  . Acid reflux 08/06/2013  . Breath shortness 08/06/2013  . Disease of thyroid gland 08/06/2013  . Tobacco abuse 06/21/2013  .  COPD, moderate (HCC) 05/24/2013    Past Surgical History:  Procedure Laterality Date  . ABDOMINAL HYSTERECTOMY     has left ovary  . APPENDECTOMY    . CARDIAC CATHETERIZATION  09/2005   had 1 stent and 3 balloons placed  . CORONARY ARTERY BYPASS GRAFT    . TONSILLECTOMY      Family History        Family Status  Relation Name Status  . Mother  Deceased  . Father  Deceased at age 53       from heart disease  . Sister  Alive  . Brother  Deceased at age 39       from MI  . Sister  Alive  . PGM  (Not Specified)  . Mat Aunt  (Not Specified)        Her family history includes Asthma in her sister; Breast cancer in her maternal aunt; Breast cancer (age of onset: 20) in her sister; Cancer in her sister; Cancer - Colon in her paternal grandmother; Coronary artery disease in her father; Diabetes in her mother; Heart attack in her brother and father; Hyperlipidemia in her sister; Hypertension in her sister; Scleroderma in her sister; Stroke in her mother; Thyroid disease in her sister.      Allergies  Allergen Reactions  . Adhesive [Tape]     Band-aids irritate skin  . Azithromycin Diarrhea and Nausea And Vomiting     Current Outpatient Medications:  .  albuterol (PROAIR HFA) 108 (90 BASE) MCG/ACT inhaler, Inhale 2 puffs into the lungs every 6 (six) hours as needed for wheezing or shortness of breath., Disp: 3 Inhaler, Rfl: 3 .  ANORO ELLIPTA 62.5-25 MCG/INH AEPB, INHALE 1 PUFF INTO THE LUNGS DAILY, Disp: 120 each, Rfl: 3 .  aspirin 81 MG tablet, Take 81 mg by mouth daily., Disp: , Rfl:  .  atorvastatin (LIPITOR) 80 MG tablet, TAKE 1 TABLET BY MOUTH EVERY DAY, Disp: 90 tablet, Rfl: 3 .  azelastine (ASTELIN) 0.1 % nasal spray, Place 2 sprays into both nostrils daily. USE 2 SPRAYS IN EACH  NOSTRIL TWICE DAILY AS  DIRECTED (Patient taking differently: Place 2 sprays into both nostrils daily. ), Disp: 16 mL, Rfl: 11 .  Calcium Carbonate (CALCIUM 600 PO), Take 1 tablet by mouth daily.,  Disp: , Rfl:  .  cetirizine (ZYRTEC) 10 MG tablet, Take 10 mg by mouth daily as needed. , Disp: , Rfl:  .  levothyroxine (SYNTHROID, LEVOTHROID) 150 MCG tablet, TAKE 1 TABLET BY MOUTH DAILY BEFORE BREAKFAST (Patient taking differently: 125 mcg TAKE 1 TABLET BY MOUTH DAILY BEFORE BREAKFAST), Disp: 90 tablet, Rfl: 0 .  Multiple Vitamin (MULTIVITAMIN) tablet, Take 1 tablet by mouth daily., Disp: , Rfl:  .  Omega-3 Fatty Acids (FISH OIL) 1000 MG CAPS, Take 4 capsules by mouth daily., Disp: , Rfl:  .  psyllium (METAMUCIL)  0.52 g capsule, Take by mouth daily., Disp: , Rfl:  .  quinapril (ACCUPRIL) 40 MG tablet, TAKE 1 TABLET BY MOUTH EVERY DAY, Disp: 90 tablet, Rfl: 3 .  zolpidem (AMBIEN) 5 MG tablet, Take 1 tablet (5 mg total) by mouth at bedtime as needed for sleep., Disp: 30 tablet, Rfl: 4 .  5-Hydroxytryptophan (5-HTP) 100 MG CAPS, Take 1 capsule by mouth at bedtime., Disp: , Rfl:  .  glucosamine-chondroitin 500-400 MG tablet, Take 2 tablets by mouth daily., Disp: , Rfl:  .  montelukast (SINGULAIR) 10 MG tablet, Take 1 tablet (10 mg total) by mouth at bedtime. (Patient not taking: Reported on 08/28/2017), Disp: 90 tablet, Rfl: 2 .  omeprazole (PRILOSEC) 20 MG capsule, Take 1 capsule (20 mg total) by mouth daily. (Patient not taking: Reported on 09/03/2018), Disp: 90 capsule, Rfl: 3   Patient Care Team: Maple Hudson., MD as PCP - General (Family Medicine) Domingo Madeira, OD as Consulting Physician (Optometry) Alwyn Pea, MD as Consulting Physician (Cardiology) Shane Crutch, MD as Consulting Physician (Pulmonary Disease) Tedd Sias Marlana Salvage, MD as Physician Assistant (Endocrinology)    Objective:    Vitals: BP 120/70 (BP Location: Left Arm, Patient Position: Sitting, Cuff Size: Normal)   Pulse (!) 49   Temp 98.7 F (37.1 C) (Oral)   Resp 16   Wt 174 lb 3.2 oz (79 kg)   SpO2 98%   BMI 27.28 kg/m    Vitals:   09/05/18 1039  BP: 120/70  Pulse: (!) 49  Resp: 16  Temp:  98.7 F (37.1 C)  TempSrc: Oral  SpO2: 98%  Weight: 174 lb 3.2 oz (79 kg)     Physical Exam Vitals signs reviewed.  Constitutional:      Appearance: She is well-developed.  HENT:     Head: Normocephalic and atraumatic.  Eyes:     General: No scleral icterus.    Conjunctiva/sclera: Conjunctivae normal.  Neck:     Thyroid: No thyromegaly.  Cardiovascular:     Rate and Rhythm: Normal rate and regular rhythm.     Heart sounds: Normal heart sounds.  Pulmonary:     Effort: Pulmonary effort is normal.     Breath sounds: Normal breath sounds.  Abdominal:     Palpations: Abdomen is soft.  Skin:    General: Skin is warm and dry.  Neurological:     Mental Status: She is alert and oriented to person, place, and time.  Psychiatric:        Behavior: Behavior normal.        Thought Content: Thought content normal.        Judgment: Judgment normal.      Depression Screen PHQ 2/9 Scores 09/05/2018 09/03/2018 09/03/2018 07/12/2017  PHQ - 2 Score 0 0 0 0  PHQ- 9 Score 1 0 - -       Assessment & Plan:     Routine Health Maintenance and Physical Exam  Exercise Activities and Dietary recommendations Goals    . Cut out extra servings     Recommend to continue with weight watchers diet and cutting out carbohydrates.        Immunization History  Administered Date(s) Administered  . Influenza, High Dose Seasonal PF 02/23/2017, 01/02/2018  . Influenza,inj,Quad PF,6+ Mos 03/20/2013  . Influenza-Unspecified 12/24/2013, 12/24/2013, 01/24/2015  . Pneumococcal Conjugate-13 03/20/2013  . Pneumococcal Polysaccharide-23 04/02/2014  . Pneumococcal-Unspecified 03/24/2013  . Td 07/10/2003  . Tdap 01/02/2018    Health Maintenance  Topic Date Due  . INFLUENZA VACCINE  11/24/2018  . MAMMOGRAM  08/25/2019  . COLONOSCOPY  11/21/2019  . DEXA SCAN  11/30/2020  . TETANUS/TDAP  01/03/2028  . Hepatitis C Screening  Completed  . PNA vac Low Risk Adult  Completed     Discussed health  benefits of physical activity, and encouraged her to engage in regular exercise appropriate for her age and condition.  1. Encounter for annual physical exam Patient is status post hysterectomy for uterine fibroids.  2. Arteriosclerosis of coronary artery All risk factors treated. - Comp. Metabolic Panel (12)  3. Essential (primary) hypertension On quinapril. - CBC w/Diff/Platelet  4. Thyroid disorder Check TSH. - TSH  5. Pure hypercholesterolemia On high-dose atorvastatin. - Comp. Metabolic Panel (12) - Lipid Profile    --------------------------------------------------------------------    [name], MD  [facility]

## 2018-09-06 ENCOUNTER — Other Ambulatory Visit: Payer: Self-pay | Admitting: Family Medicine

## 2018-09-06 DIAGNOSIS — Z1231 Encounter for screening mammogram for malignant neoplasm of breast: Secondary | ICD-10-CM

## 2018-09-06 LAB — TSH: TSH: 5.74 u[IU]/mL — ABNORMAL HIGH (ref 0.450–4.500)

## 2018-09-06 LAB — CBC WITH DIFFERENTIAL/PLATELET
Basophils Absolute: 0 10*3/uL (ref 0.0–0.2)
Basos: 0 %
EOS (ABSOLUTE): 0.1 10*3/uL (ref 0.0–0.4)
Eos: 2 %
Hematocrit: 40.7 % (ref 34.0–46.6)
Hemoglobin: 13.6 g/dL (ref 11.1–15.9)
Immature Grans (Abs): 0 10*3/uL (ref 0.0–0.1)
Immature Granulocytes: 0 %
Lymphocytes Absolute: 1.5 10*3/uL (ref 0.7–3.1)
Lymphs: 28 %
MCH: 32.3 pg (ref 26.6–33.0)
MCHC: 33.4 g/dL (ref 31.5–35.7)
MCV: 97 fL (ref 79–97)
Monocytes Absolute: 0.4 10*3/uL (ref 0.1–0.9)
Monocytes: 8 %
Neutrophils Absolute: 3.3 10*3/uL (ref 1.4–7.0)
Neutrophils: 62 %
Platelets: 174 10*3/uL (ref 150–450)
RBC: 4.21 x10E6/uL (ref 3.77–5.28)
RDW: 12 % (ref 11.7–15.4)
WBC: 5.3 10*3/uL (ref 3.4–10.8)

## 2018-09-06 LAB — COMP. METABOLIC PANEL (12)
AST: 38 IU/L (ref 0–40)
Albumin/Globulin Ratio: 1.7 (ref 1.2–2.2)
Albumin: 4.5 g/dL (ref 3.7–4.7)
Alkaline Phosphatase: 77 IU/L (ref 39–117)
BUN/Creatinine Ratio: 22 (ref 12–28)
BUN: 19 mg/dL (ref 8–27)
Bilirubin Total: 1 mg/dL (ref 0.0–1.2)
Calcium: 10.3 mg/dL (ref 8.7–10.3)
Chloride: 96 mmol/L (ref 96–106)
Creatinine, Ser: 0.88 mg/dL (ref 0.57–1.00)
GFR calc Af Amer: 76 mL/min/{1.73_m2} (ref >59)
GFR calc non Af Amer: 66 mL/min/{1.73_m2} (ref >59)
Globulin, Total: 2.6 g/dL (ref 1.5–4.5)
Glucose: 97 mg/dL (ref 65–99)
Potassium: 4 mmol/L (ref 3.5–5.2)
Sodium: 135 mmol/L (ref 134–144)
Total Protein: 7.1 g/dL (ref 6.0–8.5)

## 2018-09-06 LAB — LIPID PANEL
Chol/HDL Ratio: 2.1 ratio (ref 0.0–4.4)
Cholesterol, Total: 184 mg/dL (ref 100–199)
HDL: 89 mg/dL (ref >39)
LDL Calculated: 80 mg/dL (ref 0–99)
Triglycerides: 77 mg/dL (ref 0–149)
VLDL Cholesterol Cal: 15 mg/dL (ref 5–40)

## 2018-09-17 ENCOUNTER — Other Ambulatory Visit: Payer: Self-pay | Admitting: Family Medicine

## 2018-09-25 ENCOUNTER — Encounter: Payer: Self-pay | Admitting: Family Medicine

## 2018-09-26 ENCOUNTER — Other Ambulatory Visit: Payer: Self-pay

## 2018-09-26 DIAGNOSIS — J309 Allergic rhinitis, unspecified: Secondary | ICD-10-CM

## 2018-09-26 MED ORDER — AZELASTINE HCL 0.1 % NA SOLN: 2.0000 | Freq: Every day | NASAL | 3 refills | Status: DC

## 2018-10-23 ENCOUNTER — Other Ambulatory Visit: Payer: Self-pay

## 2018-10-23 ENCOUNTER — Ambulatory Visit
Admission: RE | Admit: 2018-10-23 | Discharge: 2018-10-23 | Disposition: A | Discharge status: Home / Self Care | Payer: Medicare HMO | Source: Ambulatory Visit | Attending: Family Medicine | Admitting: Family Medicine

## 2018-10-23 DIAGNOSIS — Z1231 Encounter for screening mammogram for malignant neoplasm of breast: Secondary | ICD-10-CM | POA: Diagnosis not present

## 2018-11-12 DIAGNOSIS — E039 Hypothyroidism, unspecified: Secondary | ICD-10-CM | POA: Diagnosis not present

## 2018-11-14 DIAGNOSIS — E039 Hypothyroidism, unspecified: Secondary | ICD-10-CM | POA: Diagnosis not present

## 2018-11-29 ENCOUNTER — Other Ambulatory Visit: Payer: Self-pay | Admitting: Family Medicine

## 2018-11-29 ENCOUNTER — Ambulatory Visit (INDEPENDENT_AMBULATORY_CARE_PROVIDER_SITE_OTHER): Payer: Medicare HMO | Admitting: Family Medicine

## 2018-11-29 ENCOUNTER — Other Ambulatory Visit: Payer: Self-pay

## 2018-11-29 ENCOUNTER — Telehealth: Payer: Self-pay | Admitting: Family Medicine

## 2018-11-29 DIAGNOSIS — N3000 Acute cystitis without hematuria: Secondary | ICD-10-CM | POA: Diagnosis not present

## 2018-11-29 DIAGNOSIS — J309 Allergic rhinitis, unspecified: Secondary | ICD-10-CM

## 2018-11-29 MED ORDER — NITROFURANTOIN MONOHYD MACRO 100 MG PO CAPS: 100.0000 mg | ORAL_CAPSULE | Freq: Two times a day (BID) | ORAL | 1 refills | Status: DC

## 2018-11-29 NOTE — Telephone Encounter (Signed)
Pt got disconnected from phone call.  Please call pt back at [phone].  Thanks, [employer]

## 2018-11-29 NOTE — Progress Notes (Signed)
Patient: Penny Reed Female    DOB: 03/11/1946   73 y.o.   MRN: [id number] Visit Date: 11/29/2018  Today's Provider: Megan Mansichard Gilbert Jr, MD   Chief Complaint  Patient presents with  . Urinary Tract Infection   Subjective:       Virtual Visit via Video Note  I connected with Penny Aranarolyn H Fantini on 11/29/18 at 11:00 AM EDT by a video enabled telemedicine application and verified that I am speaking with the correct person using two identifiers.  Location: Patient: home Provider: office   I discussed the limitations of evaluation and management by telemedicine and the availability of in person appointments. The patient expressed understanding and agreed to proceed.  History of Present Illness: 3 days of dyuria and pain. No fevers or back pain. No hematuria. She has had this before.   Observations/Objective: None  Assessment and Plan: 1. Acute cystitis without hematuria Pt sent 5 days of Macrobid with 1 refill.   Follow Up Instructions: Push fluids and cranberrry juice and some AZO.   I discussed the assessment and treatment plan with the patient. The patient was provided an opportunity to ask questions and all were answered. The patient agreed with the plan and demonstrated an understanding of the instructions.   The patient was advised to call back or seek an in-person evaluation if the symptoms worsen or if the condition fails to improve as anticipated.  I provided 9 minutes of non-face-to-face time during this encounter.     Urinary Tract Infection     Allergies  Allergen Reactions  . Adhesive [Tape]     Band-aids irritate skin  . Azithromycin Diarrhea and Nausea And Vomiting     Current Outpatient Medications:  .  5-Hydroxytryptophan (5-HTP) 100 MG CAPS, Take 1 capsule by mouth at bedtime., Disp: , Rfl:  .  albuterol (PROAIR HFA) 108 (90 BASE) MCG/ACT inhaler, Inhale 2 puffs into the lungs every 6 (six) hours as needed for wheezing or shortness of  breath., Disp: 3 Inhaler, Rfl: 3 .  ANORO ELLIPTA 62.5-25 MCG/INH AEPB, INHALE 1 PUFF INTO THE LUNGS DAILY, Disp: 120 each, Rfl: 3 .  aspirin 81 MG tablet, Take 81 mg by mouth daily., Disp: , Rfl:  .  atorvastatin (LIPITOR) 80 MG tablet, TAKE 1 TABLET BY MOUTH EVERY DAY, Disp: 90 tablet, Rfl: 3 .  azelastine (ASTELIN) 0.1 % nasal spray, Place 2 sprays into both nostrils daily., Disp: 30 mL, Rfl: 3 .  Calcium Carbonate (CALCIUM 600 PO), Take 1 tablet by mouth daily., Disp: , Rfl:  .  cetirizine (ZYRTEC) 10 MG tablet, Take 10 mg by mouth daily as needed. , Disp: , Rfl:  .  glucosamine-chondroitin 500-400 MG tablet, Take 2 tablets by mouth daily., Disp: , Rfl:  .  levothyroxine (SYNTHROID, LEVOTHROID) 150 MCG tablet, TAKE 1 TABLET BY MOUTH DAILY BEFORE BREAKFAST (Patient taking differently: 125 mcg TAKE 1 TABLET BY MOUTH DAILY BEFORE BREAKFAST), Disp: 90 tablet, Rfl: 0 .  montelukast (SINGULAIR) 10 MG tablet, Take 1 tablet (10 mg total) by mouth at bedtime. (Patient not taking: Reported on 08/28/2017), Disp: 90 tablet, Rfl: 2 .  Multiple Vitamin (MULTIVITAMIN) tablet, Take 1 tablet by mouth daily., Disp: , Rfl:  .  Omega-3 Fatty Acids (FISH OIL) 1000 MG CAPS, Take 4 capsules by mouth daily., Disp: , Rfl:  .  omeprazole (PRILOSEC) 20 MG capsule, Take 1 capsule (20 mg total) by mouth daily. (Patient not taking: Reported on 09/03/2018),  Disp: 90 capsule, Rfl: 3 .  psyllium (METAMUCIL) 0.52 g capsule, Take by mouth daily., Disp: , Rfl:  .  quinapril (ACCUPRIL) 40 MG tablet, TAKE 1 TABLET BY MOUTH EVERY DAY, Disp: 90 tablet, Rfl: 3 .  zolpidem (AMBIEN) 5 MG tablet, TAKE 1 TABLET (5 MG TOTAL) BY MOUTH AT BEDTIME AS NEEDED FOR SLEEP., Disp: 30 tablet, Rfl: 5  Review of Systems  Social History   Tobacco Use  . Smoking status: Former Smoker    Packs/day: 0.25    Years: 25.00    Pack years: 6.25    Types: Cigarettes    Quit date: 04/25/2005    Years since quitting: 13.6  . Smokeless tobacco: Never Used   Substance Use Topics  . Alcohol use: Yes    Alcohol/week: 5.0 standard drinks    Types: 5 Glasses of wine per week      Objective:   There were no vitals taken for this visit. There were no vitals filed for this visit.   Physical Exam   No results found for any visits on 11/29/18.     Assessment & Plan    1. Acute cystitis without hematuria Macrobid     Wilhemena Durie, MD  [facility]

## 2018-12-21 ENCOUNTER — Other Ambulatory Visit: Payer: Self-pay | Admitting: Family Medicine

## 2018-12-21 NOTE — Telephone Encounter (Signed)
Pharmacy requesting refills.

## 2019-01-11 DIAGNOSIS — R69 Illness, unspecified: Secondary | ICD-10-CM | POA: Diagnosis not present

## 2019-01-21 ENCOUNTER — Other Ambulatory Visit: Payer: Self-pay | Admitting: Family Medicine

## 2019-01-21 DIAGNOSIS — I2510 Atherosclerotic heart disease of native coronary artery without angina pectoris: Secondary | ICD-10-CM | POA: Diagnosis not present

## 2019-01-21 DIAGNOSIS — R011 Cardiac murmur, unspecified: Secondary | ICD-10-CM | POA: Diagnosis not present

## 2019-01-21 DIAGNOSIS — R001 Bradycardia, unspecified: Secondary | ICD-10-CM | POA: Diagnosis not present

## 2019-01-21 DIAGNOSIS — I208 Other forms of angina pectoris: Secondary | ICD-10-CM | POA: Diagnosis not present

## 2019-01-21 DIAGNOSIS — J449 Chronic obstructive pulmonary disease, unspecified: Secondary | ICD-10-CM | POA: Diagnosis not present

## 2019-01-21 DIAGNOSIS — K219 Gastro-esophageal reflux disease without esophagitis: Secondary | ICD-10-CM | POA: Diagnosis not present

## 2019-01-21 DIAGNOSIS — R0602 Shortness of breath: Secondary | ICD-10-CM | POA: Diagnosis not present

## 2019-01-21 DIAGNOSIS — I10 Essential (primary) hypertension: Secondary | ICD-10-CM | POA: Diagnosis not present

## 2019-01-21 DIAGNOSIS — R002 Palpitations: Secondary | ICD-10-CM | POA: Diagnosis not present

## 2019-02-25 DIAGNOSIS — R69 Illness, unspecified: Secondary | ICD-10-CM | POA: Diagnosis not present

## 2019-03-11 DIAGNOSIS — H524 Presbyopia: Secondary | ICD-10-CM | POA: Diagnosis not present

## 2019-03-11 DIAGNOSIS — H2513 Age-related nuclear cataract, bilateral: Secondary | ICD-10-CM | POA: Diagnosis not present

## 2019-03-11 DIAGNOSIS — H52223 Regular astigmatism, bilateral: Secondary | ICD-10-CM | POA: Diagnosis not present

## 2019-03-11 DIAGNOSIS — H25013 Cortical age-related cataract, bilateral: Secondary | ICD-10-CM | POA: Diagnosis not present

## 2019-03-11 DIAGNOSIS — H5203 Hypermetropia, bilateral: Secondary | ICD-10-CM | POA: Diagnosis not present

## 2019-04-02 DIAGNOSIS — M5382 Other specified dorsopathies, cervical region: Secondary | ICD-10-CM | POA: Diagnosis not present

## 2019-04-02 DIAGNOSIS — M9902 Segmental and somatic dysfunction of thoracic region: Secondary | ICD-10-CM | POA: Diagnosis not present

## 2019-04-02 DIAGNOSIS — M9901 Segmental and somatic dysfunction of cervical region: Secondary | ICD-10-CM | POA: Diagnosis not present

## 2019-04-02 DIAGNOSIS — M4014 Other secondary kyphosis, thoracic region: Secondary | ICD-10-CM | POA: Diagnosis not present

## 2019-05-15 DIAGNOSIS — E039 Hypothyroidism, unspecified: Secondary | ICD-10-CM | POA: Diagnosis not present

## 2019-05-30 ENCOUNTER — Other Ambulatory Visit: Payer: Self-pay | Admitting: Family Medicine

## 2019-05-30 DIAGNOSIS — I10 Essential (primary) hypertension: Secondary | ICD-10-CM

## 2019-05-30 NOTE — Telephone Encounter (Signed)
Patient medication sent to pharmacy  

## 2019-06-18 ENCOUNTER — Other Ambulatory Visit: Payer: Self-pay | Admitting: Family Medicine

## 2019-06-18 DIAGNOSIS — J309 Allergic rhinitis, unspecified: Secondary | ICD-10-CM

## 2019-06-18 MED ORDER — AZELASTINE HCL 0.1 % NA SOLN: NASAL | 5 refills | Status: DC

## 2019-06-18 NOTE — Telephone Encounter (Signed)
Medication Refill - Medication: azelastine  Has the patient contacted their pharmacy? Yes.   (Agent: If no, request that the patient contact the pharmacy for the refill.) (Agent: If yes, when and what did the pharmacy advise?)  Preferred Pharmacy (with phone number or street name):  [facility] [address] [place] - [address]  [address] [place] [address]  Phone: [phone] Fax: [phone]  Not a 24 hour pharmacy; exact hours not known.    Agent: Please be advised that RX refills may take up to 3 business days. We ask that you follow-up with your pharmacy.

## 2019-06-20 ENCOUNTER — Other Ambulatory Visit: Payer: Self-pay | Admitting: Family Medicine

## 2019-06-20 NOTE — Telephone Encounter (Signed)
Requested medication (s) are due for refill today:yes  Requested medication (s) are on the active medication list:yes  Last refill:  09/06/18  Future visit scheduled:yes  Notes to clinic:  Not delegated    Requested Prescriptions  Pending Prescriptions Disp Refills   zolpidem (AMBIEN) 5 MG tablet [Pharmacy Med Name: ZOLPIDEM TARTRATE 5 MG TABLET] 30 tablet     Sig: TAKE 1 TABLET (5 MG TOTAL) BY MOUTH AT BEDTIME AS NEEDED FOR SLEEP.      Not Delegated - Psychiatry:  Anxiolytics/Hypnotics Failed - 06/20/2019 10:33 AM      Failed - This refill cannot be delegated      Failed - Urine Drug Screen completed in last 360 days.      Failed - Valid encounter within last 6 months    Recent Outpatient Visits           6 months ago Acute cystitis without hematuria   [facility] Maple Hudson., MD   9 months ago Encounter for annual physical exam   [facility] Maple Hudson., MD   1 year ago Arteriosclerosis of coronary artery   [facility] Maple Hudson., MD   1 year ago Tachycardia   [facility] Maple Hudson., MD   1 year ago Annual physical exam   [facility] Maple Hudson., MD       Future Appointments             In 2 months Maple Hudson., MD [facility]

## 2019-07-23 ENCOUNTER — Ambulatory Visit: Payer: Self-pay | Admitting: *Deleted

## 2019-07-23 ENCOUNTER — Encounter (HOSPITAL_COMMUNITY): Payer: Self-pay | Admitting: Emergency Medicine

## 2019-07-23 ENCOUNTER — Other Ambulatory Visit: Payer: Self-pay

## 2019-07-23 ENCOUNTER — Emergency Department (HOSPITAL_COMMUNITY): Payer: Medicare HMO

## 2019-07-23 ENCOUNTER — Emergency Department (HOSPITAL_COMMUNITY)
Admission: EM | Admit: 2019-07-23 | Discharge: 2019-07-23 | Disposition: A | Discharge status: Home / Self Care | Payer: Medicare HMO | Attending: Emergency Medicine | Admitting: Emergency Medicine

## 2019-07-23 DIAGNOSIS — I712 Thoracic aortic aneurysm, without rupture: Secondary | ICD-10-CM | POA: Diagnosis present

## 2019-07-23 DIAGNOSIS — Z87891 Personal history of nicotine dependence: Secondary | ICD-10-CM | POA: Diagnosis not present

## 2019-07-23 DIAGNOSIS — I259 Chronic ischemic heart disease, unspecified: Secondary | ICD-10-CM | POA: Insufficient documentation

## 2019-07-23 DIAGNOSIS — R0602 Shortness of breath: Secondary | ICD-10-CM

## 2019-07-23 DIAGNOSIS — Z20822 Contact with and (suspected) exposure to covid-19: Secondary | ICD-10-CM | POA: Diagnosis not present

## 2019-07-23 DIAGNOSIS — I10 Essential (primary) hypertension: Secondary | ICD-10-CM | POA: Insufficient documentation

## 2019-07-23 DIAGNOSIS — E039 Hypothyroidism, unspecified: Secondary | ICD-10-CM | POA: Insufficient documentation

## 2019-07-23 DIAGNOSIS — Z79899 Other long term (current) drug therapy: Secondary | ICD-10-CM | POA: Diagnosis not present

## 2019-07-23 DIAGNOSIS — I719 Aortic aneurysm of unspecified site, without rupture: Secondary | ICD-10-CM | POA: Diagnosis not present

## 2019-07-23 DIAGNOSIS — R05 Cough: Secondary | ICD-10-CM | POA: Diagnosis not present

## 2019-07-23 DIAGNOSIS — I7121 Aneurysm of the ascending aorta, without rupture: Secondary | ICD-10-CM | POA: Diagnosis present

## 2019-07-23 LAB — TROPONIN I (HIGH SENSITIVITY)
Troponin I (High Sensitivity): 103 ng/L (ref <18)
Troponin I (High Sensitivity): 90 ng/L — ABNORMAL HIGH (ref <18)

## 2019-07-23 LAB — BASIC METABOLIC PANEL
Anion gap: 12 (ref 5–15)
BUN: 14 mg/dL (ref 8–23)
CO2: 25 mmol/L (ref 22–32)
Calcium: 9.6 mg/dL (ref 8.9–10.3)
Chloride: 99 mmol/L (ref 98–111)
Creatinine, Ser: 0.74 mg/dL (ref 0.44–1.00)
GFR calc Af Amer: >60 mL/min (ref >60)
GFR calc non Af Amer: >60 mL/min (ref >60)
Glucose, Bld: 122 mg/dL — ABNORMAL HIGH (ref 70–99)
Potassium: 3.7 mmol/L (ref 3.5–5.1)
Sodium: 136 mmol/L (ref 135–145)

## 2019-07-23 LAB — CBC
HCT: 42.3 % (ref 36.0–46.0)
Hemoglobin: 14.1 g/dL (ref 12.0–15.0)
MCH: 32.9 pg (ref 26.0–34.0)
MCHC: 33.3 g/dL (ref 30.0–36.0)
MCV: 98.6 fL (ref 80.0–100.0)
Platelets: 181 10*3/uL (ref 150–400)
RBC: 4.29 MIL/uL (ref 3.87–5.11)
RDW: 12.9 % (ref 11.5–15.5)
WBC: 8.9 10*3/uL (ref 4.0–10.5)
nRBC: 0 % (ref 0.0–0.2)

## 2019-07-23 LAB — SARS CORONAVIRUS 2 (TAT 6-24 HRS): SARS Coronavirus 2: NEGATIVE

## 2019-07-23 MED ORDER — ALBUTEROL SULFATE HFA 108 (90 BASE) MCG/ACT IN AERS
4.0000 | INHALATION_SPRAY | Freq: Once | RESPIRATORY_TRACT | Status: AC
  Administered 2019-07-23: 4 via RESPIRATORY_TRACT
  Filled 2019-07-23: qty 6.7

## 2019-07-23 MED ORDER — ALBUTEROL SULFATE HFA 108 (90 BASE) MCG/ACT IN AERS: 2.0000 | INHALATION_SPRAY | Freq: Once | RESPIRATORY_TRACT | Status: DC

## 2019-07-23 MED ORDER — IPRATROPIUM BROMIDE HFA 17 MCG/ACT IN AERS
2.0000 | INHALATION_SPRAY | Freq: Once | RESPIRATORY_TRACT | Status: AC
  Administered 2019-07-23: 2 via RESPIRATORY_TRACT
  Filled 2019-07-23: qty 12.9

## 2019-07-23 MED ORDER — IOHEXOL 350 MG/ML SOLN
80.0000 mL | Freq: Once | INTRAVENOUS | Status: AC | PRN
  Administered 2019-07-23: 80 mL via INTRAVENOUS

## 2019-07-23 NOTE — Discharge Instructions (Addendum)
Please follow-up with your cardiologist as discussed. If you develop recurrent chest pain or any difficulty breathing, return to ER for reassessment. Regarding the aortic aneurysm, please discuss this with your primary doctor, likely you will need ongoing surveillance imaging in the future.

## 2019-07-23 NOTE — Telephone Encounter (Signed)
If she is short of breath she probably needs to go to the emergency room.  If breathing okay she could possibly schedule virtual visit today or tomorrow with someone.  I unfortunately have no open slots.  If her heart is racing she might need to be seen by cardiology.

## 2019-07-23 NOTE — ED Provider Notes (Signed)
[facility] EMERGENCY DEPARTMENT Provider Note   CSN: [id number] Arrival date & time: 07/23/19  1017     History Chief Complaint  Patient presents with  . Shortness of Breath    Penny Reed is a 74 y.o. female.  Penny Reed is a 74 year old female with a history of CAD status post catheterization in 2007 and coronary artery bypass graft, HTN, HLD who presents today with dyspnea and cough. Patient states that she is fully vaccinated against COVID-19, and received her second dose 2 weeks ago. Patient states her symptoms started yesterday evening [place] airport. Pt traveled to [facility] for the weekend to visit her sisters. She states that she felt short of breath and had a dry cough while exerting herself. She tried taking her albuterol inhaler a few times overnight after she got home which gave some relief, but never resolved her symptoms completely.  She states that when she had her CABG in the past, the only symptom she had preceding it was SOB, and no chest pain, so she decided to come to the ED. She denies any fevers, chest pain, abdominal pain, nausea or vomiting, or changes in her bowel or bladder habits.       Past Medical History:  Diagnosis Date  . Allergic rhinitis   . CAD (coronary artery disease)   . Hyperlipidemia   . Hypertension   . Hypothyroidism   . LVH (left ventricular hypertrophy)    Patient Active Problem List   Diagnosis Date Noted  . Insomnia 01/26/2018  . Angina pectoris ([place]) 12/09/2014  . BP (high blood pressure) 12/09/2014  . Adiposity 12/09/2014  . Dyspnea 09/25/2014  . Abnormal respiratory rate 09/25/2014  . Acquired hypothyroidism 09/21/2014  . Allergic rhinitis 09/21/2014  . Arteriosclerosis of coronary artery 09/21/2014  . Essential (primary) hypertension 09/21/2014  . HLD (hyperlipidemia) 09/21/2014  . Cardiomegaly 09/21/2014  . Familial multiple lipoprotein-type hyperlipidemia 09/21/2014  . CAFL (chronic  airflow limitation) ([place]) 08/06/2013  . Acid reflux 08/06/2013  . Breath shortness 08/06/2013  . Disease of thyroid gland 08/06/2013  . Tobacco abuse 06/21/2013  . COPD, moderate ([place]) 05/24/2013   Past Surgical History:  Procedure Laterality Date  . ABDOMINAL HYSTERECTOMY     has left ovary  . APPENDECTOMY    . CARDIAC CATHETERIZATION  09/2005   had 1 stent and 3 balloons placed  . CORONARY ARTERY BYPASS GRAFT    . TONSILLECTOMY      OB History   No obstetric history on file.    Family History  Problem Relation Age of Onset  . Diabetes Mother   . Stroke Mother        cause of death  . Coronary artery disease Father   . Heart attack Father   . Hyperlipidemia Sister   . Hypertension Sister   . Breast cancer Sister 25  . Cancer Sister        breast cancer  . Asthma Sister   . Scleroderma Sister   . Thyroid disease Sister   . Heart attack Brother   . Cancer - Colon Paternal Grandmother   . Breast cancer Maternal Aunt        15s   Social History   Tobacco Use  . Smoking status: Former Smoker    Packs/day: 0.25    Years: 25.00    Pack years: 6.25    Types: Cigarettes    Quit date: 04/25/2005    Years since quitting: 14.2  .  Smokeless tobacco: Never Used  Substance Use Topics  . Alcohol use: Yes    Alcohol/week: 5.0 standard drinks    Types: 5 Glasses of wine per week  . Drug use: No   Home Medications Prior to Admission medications   Medication Sig Start Date End Date Taking? Authorizing Provider  5-Hydroxytryptophan (5-HTP) 100 MG CAPS Take 1 capsule by mouth at bedtime.    [provider]  albuterol (PROAIR HFA) 108 (90 BASE) MCG/ACT inhaler Inhale 2 puffs into the lungs every 6 (six) hours as needed for wheezing or shortness of breath. 12/18/14   Maple Hudson., MD  [facility] ELLIPTA 62.5-25 MCG/INH AEPB TAKE 1 PUFF BY MOUTH EVERY DAY 01/22/19   Maple Hudson., MD  aspirin 81 MG tablet Take 81 mg by mouth daily.    [provider]  atorvastatin (LIPITOR) 80 MG tablet TAKE 1 TABLET BY MOUTH EVERY DAY 12/21/18   Maple Hudson., MD  azelastine (ASTELIN) 0.1 % nasal spray SPRAY 2 SPRAYS INTO EACH NOSTRIL EVERY DAY 06/18/19   Maple Hudson., MD  Calcium Carbonate (CALCIUM 600 PO) Take 1 tablet by mouth daily.    [provider]  cetirizine (ZYRTEC) 10 MG tablet Take 10 mg by mouth daily as needed.     [provider]  glucosamine-chondroitin 500-400 MG tablet Take 2 tablets by mouth daily.    [provider]  levothyroxine (SYNTHROID, LEVOTHROID) 150 MCG tablet TAKE 1 TABLET BY MOUTH DAILY BEFORE BREAKFAST Patient taking differently: 125 mcg TAKE 1 TABLET BY MOUTH DAILY BEFORE BREAKFAST 07/06/17   Maple Hudson., MD  montelukast (SINGULAIR) 10 MG tablet Take 1 tablet (10 mg total) by mouth at bedtime. Patient not taking: Reported on 08/28/2017 11/04/16   Shane Crutch, MD  Multiple Vitamin (MULTIVITAMIN) tablet Take 1 tablet by mouth daily.    [provider]  nitrofurantoin, macrocrystal-monohydrate, (MACROBID) 100 MG capsule Take 1 capsule (100 mg total) by mouth 2 (two) times daily. 11/29/18   Maple Hudson., MD  Omega-3 Fatty Acids (FISH OIL) 1000 MG CAPS Take 4 capsules by mouth daily.    [provider]  omeprazole (PRILOSEC) 20 MG capsule Take 1 capsule (20 mg total) by mouth daily. Patient not taking: Reported on 09/03/2018 12/18/14   Maple Hudson., MD  psyllium (METAMUCIL) 0.52 g capsule Take by mouth daily.    [provider]  quinapril (ACCUPRIL) 40 MG tablet TAKE 1 TABLET BY MOUTH EVERY DAY 05/30/19   Maple Hudson., MD  zolpidem (AMBIEN) 5 MG tablet TAKE 1 TABLET (5 MG TOTAL) BY MOUTH AT BEDTIME AS NEEDED FOR SLEEP. 06/20/19   Maple Hudson., MD   Allergies    Adhesive [tape] and Azithromycin  Review of Systems   Review of Systems  All other systems reviewed and are negative.  Physical Exam Updated  Vital Signs BP (!) 208/81 (BP Location: Right Arm)   Pulse 63   Temp 98.5 F (36.9 C) (Oral)   Resp 14   Ht 5\' 7"  (1.702 m)   Wt 79.4 kg   SpO2 98%   BMI 27.41 kg/m   Physical Exam Vitals reviewed.  Constitutional:      General: She is not in acute distress.    Appearance: She is well-developed and normal weight. She is not ill-appearing, toxic-appearing or diaphoretic.  HENT:     Head: Normocephalic and atraumatic.  Eyes:  Extraocular Movements: Extraocular movements intact.  Cardiovascular:     Rate and Rhythm: Normal rate and regular rhythm.  No extrasystoles are present.    Pulses: Normal pulses. No decreased pulses.     Heart sounds: Normal heart sounds. No murmur. No friction rub. No gallop.   Pulmonary:     Effort: Pulmonary effort is normal. No tachypnea.     Breath sounds: Normal breath sounds. No decreased breath sounds, wheezing, rhonchi or rales.  Abdominal:     General: Bowel sounds are normal.     Palpations: Abdomen is soft. There is no mass.     Tenderness: There is no abdominal tenderness. There is no guarding.  Musculoskeletal:     Right lower leg: No tenderness. No edema.     Left lower leg: No tenderness. No edema.  Skin:    General: Skin is warm.  Neurological:     General: No focal deficit present.     Mental Status: She is alert and oriented to person, place, and time.  Psychiatric:        Mood and Affect: Mood normal.    ED Results / Procedures / Treatments   Labs (all labs ordered are listed, but only abnormal results are displayed) Labs Reviewed  BASIC METABOLIC PANEL - Abnormal; Notable for the following components:      Result Value   Glucose, Bld 122 (*)    All other components within normal limits  SARS CORONAVIRUS 2 (TAT 6-24 HRS)  CBC  TROPONIN I (HIGH SENSITIVITY)   EKG EKG Interpretation  Date/Time:  Tuesday July 23 2019 10:21:16 EDT Ventricular Rate:  58 PR Interval:  184 QRS Duration: 88 QT Interval:  450 QTC  Calculation: 441 R Axis:   65 Text Interpretation: Sinus bradycardia Septal infarct , age undetermined st depression in infrolateral leads Confirmed by Gwyneth Sprout ([id number]) on 07/23/2019 11:27:22 AM  Radiology No results found.  Procedures Procedures (including critical care time)  Medications Ordered in ED Medications - No data to display  ED Course  I have reviewed the triage vital signs and the nursing notes.  Pertinent labs & imaging results that were available during my care of the patient were reviewed by me and considered in my medical decision making (see chart for details).    MDM Rules/Calculators/A&P                       Patient presents with acute shortness of breath and dry cough.  She is fully vaccinated against Covid as of 2 weeks ago.  Although she did travel to [place] this past weekend, I have a low suspicion for Covid at this time.  She denies any noticeable changes in her taste and smell. Patient does have history of allergic rhinitis and COPD.  I suspect with the changes in the weather and increased pollen, it is possible that she is experiencing some mild atopic disease.  Will order albuterol and Atrovent.  Thus far, her CBC, BMP were normal and she has a normal chest x-ray. EKG shows no acute ischemic process.  Troponin elevated to 103. Pt states that she feels better after albuterol and Atrovent treatments. Denies chest pain. Will trend the troponin. Moderate range HEART score of 5.   Repeat troponin decreased to 90.  Patient continues to deny any chest pain or S OB at this time.  She feels like she is at her baseline. Will touch base with cariology for further reccomedations.  Final Clinical Impression(s) / ED Diagnoses Final diagnoses:  None   Rx / DC Orders ED Discharge Orders    None       Gwyneth Sprout, MD 07/24/19 1526

## 2019-07-23 NOTE — ED Triage Notes (Signed)
Pt reports SOB, cough, HA and not feeling well since last night. Recent travel to [place] but no known sick contacts.

## 2019-07-23 NOTE — ED Provider Notes (Signed)
Signout note  74 year old lady with notable history CAD s/p CABG presented to ER after episode of shortness of breath. In ER symptoms resolved. EKG without new acute ischemic changes, initial troponin mildly elevated, second troponin unchanged. Case discussed with cardiology recommended obtaining PE study.  4:30 PM Follow-up PE study, discussed with cards, if negative, will likely close outpatient follow-up with her primary cardiologist  5:36 PM d/w cardiology, rec close out pt f/u with her primary cardiologist; d/w pt and husband, remains symptom free with stable vitals, reviewed CT scan - no PE, incidental aortic aneurysm, pos mild edema or atypical infiltrate; no infectious symptoms, does not appear fluid overload; pt understanding need for cards f/u, PCP f/u regarding aortic aneurysm   Milagros Loll, MD 07/23/19 [phone]

## 2019-07-23 NOTE — Telephone Encounter (Signed)
I spoke with patient.  She was seen in [facility] ED and feels better.  She has an appointment with Dr. Juliann Pares from cardiology.  No call or appointment needed at this time.

## 2019-07-23 NOTE — Telephone Encounter (Signed)
Pt and her husband called stating her the pt says that it feels like gerbils are running in her chest, and rattling in her chest when she lays down, her temp is  98.2, and BP is 189/70; she has used her albuterol inhaler 3 times in the past 14 hours; sitting up makes it easier to breathe, but she is having SOB with and without exertion; the pt just returned from [facility] and her symptoms started 3/39/21 PM; the pt also says that she has used her albuterol inhaler more than she ever has (3 times in 14 hrs); she is also havingCough, achy in joints and muscles; sore throat, headache; sore throat, and drainage; the pt says that she had both of her COVID vaccines, but she is scheduled for a test at [facility] today; recommendations made per nurse triage protocol; she verbalized understanding; the pt sees Dr Sullivan Lone, [facility]; will route to office for notification.  Reason for Disposition . [1] MODERATE difficulty breathing (e.g., speaks in phrases, SOB even at rest, pulse 100-120) AND [2] NEW-onset or WORSE than normal  Answer Assessment - Initial Assessment Questions 1. RESPIRATORY STATUS: "Describe your breathing?" (e.g., wheezing, shortness of breath, unable to speak, severe coughing)      SOB with/without exertion; SOB better when sitting up 2. ONSET: "When did this breathing problem begin?"      07/22/19 3. PATTERN "Does the difficult breathing come and go, or has it been constant since it started?"      constant 4. SEVERITY: "How bad is your breathing?" (e.g., mild, moderate, severe)    - MILD: No SOB at rest, mild SOB with walking, speaks normally in sentences, can lay down, no retractions, pulse < 100.    - MODERATE: SOB at rest, SOB with minimal exertion and prefers to sit, cannot lie down flat, speaks in phrases, mild retractions, audible wheezing, pulse 100-120.    - SEVERE: Very SOB at rest, speaks in single words, struggling to breathe, sitting hunched forward, retractions, pulse > 120   moderated 5. RECURRENT SYMPTOM: "Have you had difficulty breathing before?" If so, ask: "When was the last time?" and "What happened that time?"      no 6. CARDIAC HISTORY: "Do you have any history of heart disease?" (e.g., heart attack, angina, bypass surgery, angioplasty)      Bypass surgery 2011 7. LUNG HISTORY: "Do you have any history of lung disease?"  (e.g., pulmonary embolus, asthma, emphysema)     COPD 8. CAUSE: "What do you think is causing the breathing problem?"     Not sure 9. OTHER SYMPTOMS: "Do you have any other symptoms? (e.g., dizziness, runny nose, cough, chest pain, fever)     Cough, achy in joints and muscles; sore throat, headache; drainage down throat 10. PREGNANCY: "Is there any chance you are pregnant?" "When was your last menstrual period?"       no 11. TRAVEL: "Have you traveled out of the country in the last month?" (e.g., travel history, exposures)      Recently returned home from visiting [place]  Protocols used: BREATHING DIFFICULTY-A-AH

## 2019-07-25 ENCOUNTER — Other Ambulatory Visit
Admission: RE | Admit: 2019-07-25 | Discharge: 2019-07-25 | Disposition: A | Discharge status: Home / Self Care | Payer: Medicare HMO | Source: Ambulatory Visit | Attending: Student | Admitting: Student

## 2019-07-25 DIAGNOSIS — R0602 Shortness of breath: Secondary | ICD-10-CM | POA: Insufficient documentation

## 2019-07-25 DIAGNOSIS — R0789 Other chest pain: Secondary | ICD-10-CM | POA: Insufficient documentation

## 2019-07-25 DIAGNOSIS — I10 Essential (primary) hypertension: Secondary | ICD-10-CM | POA: Diagnosis not present

## 2019-07-25 DIAGNOSIS — R0989 Other specified symptoms and signs involving the circulatory and respiratory systems: Secondary | ICD-10-CM | POA: Diagnosis not present

## 2019-07-25 DIAGNOSIS — I208 Other forms of angina pectoris: Secondary | ICD-10-CM | POA: Diagnosis not present

## 2019-07-25 DIAGNOSIS — J449 Chronic obstructive pulmonary disease, unspecified: Secondary | ICD-10-CM | POA: Diagnosis not present

## 2019-07-25 DIAGNOSIS — E782 Mixed hyperlipidemia: Secondary | ICD-10-CM | POA: Diagnosis not present

## 2019-07-25 DIAGNOSIS — I25810 Atherosclerosis of coronary artery bypass graft(s) without angina pectoris: Secondary | ICD-10-CM | POA: Diagnosis not present

## 2019-07-25 DIAGNOSIS — K219 Gastro-esophageal reflux disease without esophagitis: Secondary | ICD-10-CM | POA: Diagnosis not present

## 2019-07-25 DIAGNOSIS — Z951 Presence of aortocoronary bypass graft: Secondary | ICD-10-CM | POA: Diagnosis not present

## 2019-07-25 LAB — BRAIN NATRIURETIC PEPTIDE: B Natriuretic Peptide: 238 pg/mL — ABNORMAL HIGH (ref 0.0–100.0)

## 2019-08-22 ENCOUNTER — Telehealth: Payer: Self-pay | Admitting: Family Medicine

## 2019-08-22 NOTE — Telephone Encounter (Signed)
Left message for patient to schedule Annual Wellness Visit.  Please schedule with Nurse Health Advisor Victoria Britt, RN at [facility]  

## 2019-08-23 DIAGNOSIS — I2510 Atherosclerotic heart disease of native coronary artery without angina pectoris: Secondary | ICD-10-CM | POA: Diagnosis not present

## 2019-08-23 DIAGNOSIS — J449 Chronic obstructive pulmonary disease, unspecified: Secondary | ICD-10-CM | POA: Diagnosis not present

## 2019-08-23 DIAGNOSIS — I110 Hypertensive heart disease with heart failure: Secondary | ICD-10-CM | POA: Diagnosis not present

## 2019-08-23 DIAGNOSIS — K5900 Constipation, unspecified: Secondary | ICD-10-CM | POA: Diagnosis not present

## 2019-08-23 DIAGNOSIS — G4700 Insomnia, unspecified: Secondary | ICD-10-CM | POA: Diagnosis not present

## 2019-08-23 DIAGNOSIS — E039 Hypothyroidism, unspecified: Secondary | ICD-10-CM | POA: Diagnosis not present

## 2019-08-23 DIAGNOSIS — I509 Heart failure, unspecified: Secondary | ICD-10-CM | POA: Diagnosis not present

## 2019-08-23 DIAGNOSIS — Z008 Encounter for other general examination: Secondary | ICD-10-CM | POA: Diagnosis not present

## 2019-08-23 DIAGNOSIS — I252 Old myocardial infarction: Secondary | ICD-10-CM | POA: Diagnosis not present

## 2019-08-23 DIAGNOSIS — E785 Hyperlipidemia, unspecified: Secondary | ICD-10-CM | POA: Diagnosis not present

## 2019-08-23 DIAGNOSIS — E261 Secondary hyperaldosteronism: Secondary | ICD-10-CM | POA: Diagnosis not present

## 2019-09-02 DIAGNOSIS — R69 Illness, unspecified: Secondary | ICD-10-CM | POA: Diagnosis not present

## 2019-09-03 NOTE — Progress Notes (Signed)
Subjective:   Penny Reed is a 74 y.o. female who presents for Medicare Annual (Subsequent) preventive examination.  This visit is being conducted via phone call  - after an attmept to do on video chat - due to the COVID-19 pandemic. This patient has given me verbal consent via phone to conduct this visit, patient states they are participating from their home address. Some vital signs may be absent or patient reported.    Patient identification: identified by name, DOB, and current address.  Due to this being a telephonic visit, the after visit summary with patients personalized plan was offered to patient via my-chart.   Review of Systems:  N/A  Cardiac Risk Factors include: advanced age (>90men, >25 women);hypertension;dyslipidemia     Objective:     Vitals: There were no vitals taken for this visit.  There is no height or weight on file to calculate BMI. Unable to obtain vitals due to visit being conducted via telephonically.   Advanced Directives 09/04/2019 07/23/2019 09/03/2018 07/12/2017 11/07/2016 12/09/2014  Does Patient Have a Medical Advance Directive? Yes Yes Yes Yes Yes Yes  Type of Estate agent of [place];Living will Healthcare Power of [place];Living will Living will;Healthcare Power of [employer] Power of [place];Living will Living will Living will  Does patient want to make changes to medical advance directive? - No - Patient declined - - - -  Copy of Healthcare Power of Attorney in Chart? No - copy requested No - copy requested No - copy requested No - copy requested - -  Would patient like information on creating a medical advance directive? - No - Patient declined - - - -    Tobacco Social History   Tobacco Use  Smoking Status Former Smoker  . Packs/day: 0.25  . Years: 25.00  . Pack years: 6.25  . Types: Cigarettes  . Quit date: 04/25/2005  . Years since quitting: 14.3  Smokeless Tobacco Never Used     Counseling given:  Not Answered   Clinical Intake:  Pre-visit preparation completed: Yes  Pain : No/denies pain     Nutritional Risks: None Diabetes: No  How often do you need to have someone help you when you read instructions, pamphlets, or other written materials from your doctor or pharmacy?: 1 - Never  Interpreter Needed?: No  Information entered by :: [facility], LPN  Past Medical History:  Diagnosis Date  . Allergic rhinitis   . CAD (coronary artery disease)   . Hyperlipidemia   . Hypertension   . Hypothyroidism   . LVH (left ventricular hypertrophy)    Past Surgical History:  Procedure Laterality Date  . ABDOMINAL HYSTERECTOMY     has left ovary  . APPENDECTOMY    . CARDIAC CATHETERIZATION  09/2005   had 1 stent and 3 balloons placed  . CORONARY ARTERY BYPASS GRAFT    . TONSILLECTOMY     Family History  Problem Relation Age of Onset  . Diabetes Mother   . Stroke Mother        cause of death  . Coronary artery disease Father   . Heart attack Father   . Hyperlipidemia Sister   . Hypertension Sister   . Breast cancer Sister 36  . Cancer Sister        breast cancer  . Asthma Sister   . Scleroderma Sister   . Thyroid disease Sister   . Heart attack Brother   . Cancer - Colon Paternal Grandmother   .  Breast cancer Maternal Aunt        84s   Social History   Socioeconomic History  . Marital status: Married    Spouse name: Not on file  . Number of children: 3  . Years of education: Not on file  . Highest education level: Associate degree: occupational, Scientist, product/process development, or vocational program  Occupational History  . Not on file  Tobacco Use  . Smoking status: Former Smoker    Packs/day: 0.25    Years: 25.00    Pack years: 6.25    Types: Cigarettes    Quit date: 04/25/2005    Years since quitting: 14.3  . Smokeless tobacco: Never Used  Substance and Sexual Activity  . Alcohol use: Yes    Alcohol/week: 5.0 standard drinks    Types: 5 Glasses of wine per week     Comment: spread out between 2-3 nights a week  . Drug use: No  . Sexual activity: Not on file  Other Topics Concern  . Not on file  Social History Narrative  . Not on file   Social Determinants of Health   Financial Resource Strain: Low Risk   . Difficulty of Paying Living Expenses: Not hard at all  Food Insecurity: No Food Insecurity  . Worried About Programme researcher, broadcasting/film/video in the Last Year: Never true  . Ran Out of Food in the Last Year: Never true  Transportation Needs: No Transportation Needs  . Lack of Transportation (Medical): No  . Lack of Transportation (Non-Medical): No  Physical Activity: Sufficiently Active  . Days of Exercise per Week: 6 days  . Minutes of Exercise per Session: 60 min  Stress: No Stress Concern Present  . Feeling of Stress : Not at all  Social Connections: Slightly Isolated  . Frequency of Communication with Friends and Family: More than three times a week  . Frequency of Social Gatherings with Friends and Family: Three times a week  . Attends Religious Services: Never  . Active Member of Clubs or Organizations: Yes  . Attends Banker Meetings: More than 4 times per year  . Marital Status: Married    Outpatient Encounter Medications as of 09/04/2019  Medication Sig  . albuterol (PROAIR HFA) 108 (90 BASE) MCG/ACT inhaler Inhale 2 puffs into the lungs every 6 (six) hours as needed for wheezing or shortness of breath.  [name] amLODipine (NORVASC) 5 MG tablet TAKE ONE TABLET TWICE DAILY AS NEEDED FOR SYSTOLIC BLOOD PRESSURE OVER 170  . ANORO ELLIPTA 62.5-25 MCG/INH AEPB TAKE 1 PUFF BY MOUTH EVERY DAY (Patient taking differently: Inhale 1 puff into the lungs daily. )  . aspirin 81 MG tablet Take 81 mg by mouth daily.  [name] atorvastatin (LIPITOR) 80 MG tablet TAKE 1 TABLET BY MOUTH EVERY DAY (Patient taking differently: Take 80 mg by mouth daily at 6 PM. )  . azelastine (ASTELIN) 0.1 % nasal spray SPRAY 2 SPRAYS INTO EACH NOSTRIL EVERY DAY (Patient taking  differently: Place 2 sprays into both nostrils daily. SPRAY 2 SPRAYS INTO EACH NOSTRIL EVERY DAY)  . Calcium Carbonate (CALCIUM 600 PO) Take 1 tablet by mouth daily.  . cetirizine (ZYRTEC) 10 MG tablet Take 10 mg by mouth daily as needed for allergies.   . cholecalciferol (VITAMIN D3) 25 MCG (1000 UNIT) tablet Take 1,000 Units by mouth daily.  . furosemide (LASIX) 40 MG tablet Take 40 mg by mouth as needed (for swelling).   [name] levothyroxine (SYNTHROID) 112 MCG tablet TAKE ONE TABLET  BY MOUTH SIX DAYS PER WEEK. TAKE 1 AND 1/2 TABLET BY MOUTH ONE DAY PER WEEK.  . Multiple Vitamin (MULTIVITAMIN) tablet Take 1 tablet by mouth daily.  . nitroGLYCERIN (NITROSTAT) 0.4 MG SL tablet Place 0.4 mg under the tongue every 5 (five) minutes as needed for chest pain.   . Omega-3 Fatty Acids (FISH OIL) 1000 MG CAPS Take 4 capsules by mouth daily.  . psyllium (METAMUCIL) 0.52 g capsule Take 0.52 g by mouth daily. Fiber capsules  . quinapril (ACCUPRIL) 40 MG tablet TAKE 1 TABLET BY MOUTH EVERY DAY (Patient taking differently: Take 40 mg by mouth daily. )  . zolpidem (AMBIEN) 5 MG tablet TAKE 1 TABLET (5 MG TOTAL) BY MOUTH AT BEDTIME AS NEEDED FOR SLEEP.  [name] levothyroxine (SYNTHROID, LEVOTHROID) 150 MCG tablet TAKE 1 TABLET BY MOUTH DAILY BEFORE BREAKFAST (Patient not taking: No sig reported)  . montelukast (SINGULAIR) 10 MG tablet Take 1 tablet (10 mg total) by mouth at bedtime. (Patient not taking: Reported on 08/28/2017)  . nitrofurantoin, macrocrystal-monohydrate, (MACROBID) 100 MG capsule Take 1 capsule (100 mg total) by mouth 2 (two) times daily. (Patient not taking: Reported on 07/23/2019)  . omeprazole (PRILOSEC) 20 MG capsule Take 1 capsule (20 mg total) by mouth daily. (Patient not taking: Reported on 09/03/2018)   No facility-administered encounter medications on file as of 09/04/2019.    Activities of Daily Living In your present state of health, do you have any difficulty performing the following activities:  09/04/2019  Hearing? N  Vision? N  Difficulty concentrating or making decisions? N  Walking or climbing stairs? N  Dressing or bathing? N  Doing errands, shopping? N  Preparing Food and eating ? N  Using the Toilet? N  In the past six months, have you accidently leaked urine? Y  Comment Occasionally due to not voiding when need to.  Do you have problems with loss of bowel control? N  Managing your Medications? N  Managing your Finances? N  Housekeeping or managing your Housekeeping? N  Some recent data might be hidden    Patient Care Team: Maple Hudson., MD as PCP - General (Family Medicine) Domingo Madeira, OD as Consulting Physician (Optometry) Alwyn Pea, MD as Consulting Physician (Cardiology) Tedd Sias Marlana Salvage, MD as Physician Assistant (Endocrinology) Reed Marse[place], DMD (Dentistry)    Assessment:   This is a routine wellness examination for [place].  Exercise Activities and Dietary recommendations Current Exercise Habits: Structured exercise class, Type of exercise: strength training/weights;stretching;Other - see comments(eliptical and circuit training.), Time (Minutes): 60, Frequency (Times/Week): 6, Weekly Exercise (Minutes/Week): 360, Intensity: Moderate, Exercise limited by: None identified  Goals    . Cut out extra servings     Recommend to continue with weight watchers diet and cutting out carbohydrates.        Fall Risk: Fall Risk  09/04/2019 09/03/2018 07/12/2017 08/24/2016 06/15/2015  Falls in the past year? 0 0 No No No  Number falls in past yr: 0 - - - -  Injury with Fall? 0 - - - -    FALL RISK PREVENTION PERTAINING TO THE HOME:  Any stairs in or around the home? Yes  If so, are there any without handrails? No   Home free of loose throw rugs in walkways, pet beds, electrical cords, etc? Yes  Adequate lighting in your home to reduce risk of falls? Yes   ASSISTIVE DEVICES UTILIZED TO PREVENT FALLS:  Life alert? No  Use of a  cane, walker  or w/c? No  Grab bars in the bathroom? No  Shower chair or bench in shower? No  Elevated toilet seat or a handicapped toilet? No    TIMED UP AND GO:  Was the test performed? No .    Depression Screen PHQ 2/9 Scores 09/04/2019 09/05/2018 09/03/2018 09/03/2018  PHQ - 2 Score 0 0 0 0  PHQ- 9 Score - 1 0 -     Cognitive Function     6CIT Screen 09/04/2019 07/12/2017  What Year? 0 points 0 points  What month? 0 points 0 points  What time? 0 points 0 points  Count back from 20 0 points 0 points  Months in reverse 0 points 0 points  Repeat phrase 0 points 0 points  Total Score 0 0    Immunization History  Administered Date(s) Administered  . Fluad Quad(high Dose 65+) 01/11/2019  . Influenza, High Dose Seasonal PF 02/23/2017, 01/02/2018  . Influenza,inj,Quad PF,6+ Mos 03/20/2013  . Influenza-Unspecified 12/24/2013, 12/24/2013, 01/24/2015  . Pneumococcal Conjugate-13 03/20/2013  . Pneumococcal Polysaccharide-23 04/02/2014  . Pneumococcal-Unspecified 03/24/2013  . Td 07/10/2003  . Tdap 01/02/2018    Qualifies for Shingles Vaccine? Yes . Due for Shingrix. Pt has been advised to call insurance company to determine out of pocket expense. Advised may also receive vaccine at local pharmacy or Health Dept. Verbalized acceptance and understanding.  Tdap: Up to date  Flu Vaccine: Up to date  Pneumococcal Vaccine: Completed series  Screening Tests Health Maintenance  Topic Date Due  . COVID-19 Vaccine (1) Never done  . COLONOSCOPY  11/21/2019  . INFLUENZA VACCINE  11/24/2019  . MAMMOGRAM  10/22/2020  . TETANUS/TDAP  01/03/2028  . DEXA SCAN  Completed  . Hepatitis C Screening  Completed  . PNA vac Low Risk Adult  Completed    Cancer Screenings:  Colorectal Screening: Completed 11/20/09. Repeat every 10 years.   Mammogram: Completed 10/23/18. Repeat every 1-2 years as advised.   Bone Density: Completed 12/01/10. Previous DEXA scan was normal. No repeat needed unless advised by  a physician.  Lung Cancer Screening: (Low Dose CT Chest recommended if Age 50-80 years, 30 pack-year currently smoking OR have quit w/in 15years.) does qualify, however had this completed 07/23/19. Repeat yearly.  Additional Screening:  Hepatitis C Screening: Up to date  Vision Screening: Recommended annual ophthalmology exams for early detection of glaucoma and other disorders of the eye.  Dental Screening: Recommended annual dental exams for proper oral hygiene  Community Resource Referral:  CRR required this visit?  No       Plan:  I have personally reviewed and addressed the Medicare Annual Wellness questionnaire and have noted the following in the patient's chart:  A. Medical and social history B. Use of alcohol, tobacco or illicit drugs  C. Current medications and supplements D. Functional ability and status E.  Nutritional status F.  Physical activity G. Advance directives H. List of other physicians I.  Hospitalizations, surgeries, and ER visits in previous 12 months J.  [place] such as hearing and vision if needed, cognitive and depression L. Referrals and appointments   In addition, I have reviewed and discussed with patient certain preventive protocols, quality metrics, and best practice recommendations. A written personalized care plan for preventive services as well as general preventive health recommendations were provided to patient. Nurse Health Advisor  Signed,    Tate Zagal [place], [place]  12/10/5629 Nurse Health Advisor   Nurse Notes: None.

## 2019-09-04 ENCOUNTER — Other Ambulatory Visit: Payer: Self-pay

## 2019-09-04 ENCOUNTER — Ambulatory Visit (INDEPENDENT_AMBULATORY_CARE_PROVIDER_SITE_OTHER): Payer: Medicare HMO

## 2019-09-04 DIAGNOSIS — Z0000 Encounter for general adult medical examination without abnormal findings: Secondary | ICD-10-CM

## 2019-09-04 NOTE — Patient Instructions (Signed)
Penny Reed , Thank you for taking time to come for your Medicare Wellness Visit. I appreciate your ongoing commitment to your health goals. Please review the following plan we discussed and let me know if I can assist you in the future.   Screening recommendations/referrals: Colonoscopy: Up to date, due 10/2019 Mammogram: Up to date, due 09/2020 Bone Density: Up to date. Previous DEXA scan was normal. No repeat needed unless advised by a physician. Recommended yearly ophthalmology/optometry visit for glaucoma screening and checkup Recommended yearly dental visit for hygiene and checkup  Vaccinations: Influenza vaccine: Up to date Pneumococcal vaccine: Completed series Tdap vaccine: Up to date, due 12/2027 Shingles vaccine: Pt declines today.     Advanced directives: Please bring a copy of your POA (Power of Attorney) and/or Living Will to your next appointment.   Conditions/risks identified: Recommend to continue to cut out carbohydrates in daily diet and stick with the weight watchers program and diet suggestions.   Next appointment: 09/10/70 @ 2:00 PM with Dr Sullivan Lone    Preventive Care 72 Years and Older, Female Preventive care refers to lifestyle choices and visits with your health care provider that can promote health and wellness. What does preventive care include?  A yearly physical exam. This is also called an annual well check.  Dental exams once or twice a year.  Routine eye exams. Ask your health care provider how often you should have your eyes checked.  Personal lifestyle choices, including:  Daily care of your teeth and gums.  Regular physical activity.  Eating a healthy diet.  Avoiding tobacco and drug use.  Limiting alcohol use.  Practicing safe sex.  Taking low-dose aspirin every day.  Taking vitamin and mineral supplements as recommended by your health care provider. What happens during an annual well check? The services and screenings done by your  health care provider during your annual well check will depend on your age, overall health, lifestyle risk factors, and family history of disease. Counseling  Your health care provider may ask you questions about your:  Alcohol use.  Tobacco use.  Drug use.  Emotional well-being.  Home and relationship well-being.  Sexual activity.  Eating habits.  History of falls.  Memory and ability to understand (cognition).  Work and work Astronomer.  Reproductive health. Screening  You may have the following tests or measurements:  Height, weight, and BMI.  Blood pressure.  Lipid and cholesterol levels. These may be checked every 5 years, or more frequently if you are over 44 years old.  Skin check.  Lung cancer screening. You may have this screening every year starting at age 57 if you have a 30-pack-year history of smoking and currently smoke or have quit within the past 15 years.  Fecal occult blood test (FOBT) of the stool. You may have this test every year starting at age 20.  Flexible sigmoidoscopy or colonoscopy. You may have a sigmoidoscopy every 5 years or a colonoscopy every 10 years starting at age 66.  Hepatitis C blood test.  Hepatitis B blood test.  Sexually transmitted disease (STD) testing.  Diabetes screening. This is done by checking your blood sugar (glucose) after you have not eaten for a while (fasting). You may have this done every 1-3 years.  Bone density scan. This is done to screen for osteoporosis. You may have this done starting at age 92.  Mammogram. This may be done every 1-2 years. Talk to your health care provider about how often you should have  regular mammograms. Talk with your health care provider about your test results, treatment options, and if necessary, the need for more tests. Vaccines  Your health care provider may recommend certain vaccines, such as:  Influenza vaccine. This is recommended every year.  Tetanus, diphtheria, and  acellular pertussis (Tdap, Td) vaccine. You may need a Td booster every 10 years.  Zoster vaccine. You may need this after age 75.  Pneumococcal 13-valent conjugate (PCV13) vaccine. One dose is recommended after age 12.  Pneumococcal polysaccharide (PPSV23) vaccine. One dose is recommended after age 50. Talk to your health care provider about which screenings and vaccines you need and how often you need them. This information is not intended to replace advice given to you by your health care provider. Make sure you discuss any questions you have with your health care provider. Document Released: 05/08/2015 Document Revised: 12/30/2015 Document Reviewed: 02/10/2015 Elsevier Interactive Patient Education  2017 [place] Prevention in the Home Falls can cause injuries. They can happen to people of all ages. There are many things you can do to make your home safe and to help prevent falls. What can I do on the outside of my home?  Regularly fix the edges of walkways and driveways and fix any cracks.  Remove anything that might make you trip as you walk through a door, such as a raised step or threshold.  Trim any bushes or trees on the path to your home.  Use bright outdoor lighting.  Clear any walking paths of anything that might make someone trip, such as rocks or tools.  Regularly check to see if handrails are loose or broken. Make sure that both sides of any steps have handrails.  Any raised decks and porches should have guardrails on the edges.  Have any leaves, snow, or ice cleared regularly.  Use sand or salt on walking paths during winter.  Clean up any spills in your garage right away. This includes oil or grease spills. What can I do in the bathroom?  Use night lights.  Install grab bars by the toilet and in the tub and shower. Do not use towel bars as grab bars.  Use non-skid mats or decals in the tub or shower.  If you need to sit down in the shower, use  a plastic, non-slip stool.  Keep the floor dry. Clean up any water that spills on the floor as soon as it happens.  Remove soap buildup in the tub or shower regularly.  Attach bath mats securely with double-sided non-slip rug tape.  Do not have throw rugs and other things on the floor that can make you trip. What can I do in the bedroom?  Use night lights.  Make sure that you have a light by your bed that is easy to reach.  Do not use any sheets or blankets that are too big for your bed. They should not hang down onto the floor.  Have a firm chair that has side arms. You can use this for support while you get dressed.  Do not have throw rugs and other things on the floor that can make you trip. What can I do in the kitchen?  Clean up any spills right away.  Avoid walking on wet floors.  Keep items that you use a lot in easy-to-reach places.  If you need to reach something above you, use a strong step stool that has a grab bar.  Keep electrical cords out of the  way.  Do not use floor polish or wax that makes floors slippery. If you must use wax, use non-skid floor wax.  Do not have throw rugs and other things on the floor that can make you trip. What can I do with my stairs?  Do not leave any items on the stairs.  Make sure that there are handrails on both sides of the stairs and use them. Fix handrails that are broken or loose. Make sure that handrails are as long as the stairways.  Check any carpeting to make sure that it is firmly attached to the stairs. Fix any carpet that is loose or worn.  Avoid having throw rugs at the top or bottom of the stairs. If you do have throw rugs, attach them to the floor with carpet tape.  Make sure that you have a light switch at the top of the stairs and the bottom of the stairs. If you do not have them, ask someone to add them for you. What else can I do to help prevent falls?  Wear shoes that:  Do not have high heels.  Have  rubber bottoms.  Are comfortable and fit you well.  Are closed at the toe. Do not wear sandals.  If you use a stepladder:  Make sure that it is fully opened. Do not climb a closed stepladder.  Make sure that both sides of the stepladder are locked into place.  Ask someone to hold it for you, if possible.  Clearly mark and make sure that you can see:  Any grab bars or handrails.  First and last steps.  Where the edge of each step is.  Use tools that help you move around (mobility aids) if they are needed. These include:  Canes.  Walkers.  Scooters.  Crutches.  Turn on the lights when you go into a dark area. Replace any light bulbs as soon as they burn out.  Set up your furniture so you have a clear path. Avoid moving your furniture around.  If any of your floors are uneven, fix them.  If there are any pets around you, be aware of where they are.  Review your medicines with your doctor. Some medicines can make you feel dizzy. This can increase your chance of falling. Ask your doctor what other things that you can do to help prevent falls. This information is not intended to replace advice given to you by your health care provider. Make sure you discuss any questions you have with your health care provider. Document Released: 02/05/2009 Document Revised: 09/17/2015 Document Reviewed: 05/16/2014 Elsevier Interactive Patient Education  2017 [employer].

## 2019-09-06 NOTE — Progress Notes (Signed)
Penny Reed,acting as a scribe for Penny Mans, MD.,have documented all relevant documentation on the behalf of Penny Mans, MD,as directed by  Penny Mans, MD while in the presence of Penny Mans, MD.  Established patient visit   Patient: Penny Reed   DOB: 06-12-1945   74 y.o. Female  MRN: [id number] Visit Date: 09/10/2019  Today's healthcare provider: [name], MD   Chief Complaint  Patient presents with  . Hypertension  . Hyperlipidemia   Subjective    HPI Patient had AWV with [facility] on 09/04/2019. Patient feels well.  After Covid vaccine she took a trip to [place] last month and was able to see her sisters.  She greatly enjoyed this. Overall she feels very well.  She had ETT and Myoview yesterday. Hypertension, follow-up  BP Readings from Last 3 Encounters:  09/10/19 (!) 134/55  07/23/19 (!) 178/82  09/05/18 120/70   Wt Readings from Last 3 Encounters:  09/10/19 179 lb (81.2 kg)  07/23/19 175 lb (79.4 kg)  09/05/18 174 lb 3.2 oz (79 kg)     She was last seen for hypertension 1 years ago.  BP at that visit was 120/70. Management since that visit includes no changes, on quinapril.  She reports good compliance with treatment. She is having side effects. Elevated blood pressure readings and headaches  She is following a Low fat, Low Sodium diet. She is exercising. She does not smoke.  Use of agents associated with hypertension: none.   Outside blood pressures are 1:15 today - 141/65  Lab Results  Component Value Date   CHOL 184 09/05/2018   HDL 89 09/05/2018   LDLCALC 80 09/05/2018   TRIG 77 09/05/2018   CHOLHDL 2.1 09/05/2018   Lab Results  Component Value Date   NA 136 07/23/2019   K 3.7 07/23/2019   CREATININE 0.74 07/23/2019   GFRNONAA >60 07/23/2019   GFRAA >60 07/23/2019   GLUCOSE 122 (H) 07/23/2019     The 10-year ASCVD risk score Penny George DC Jr., et al., 2013) is: 15.8%    --------------------------------------------------------------------------------------------------- Patient had stress test and echocardiogram done yesterday at [facility].       Medications: Outpatient Medications Prior to Visit  Medication Sig  . albuterol (PROAIR HFA) 108 (90 BASE) MCG/ACT inhaler Inhale 2 puffs into the lungs every 6 (six) hours as needed for wheezing or shortness of breath.  [name] amLODipine (NORVASC) 5 MG tablet TAKE ONE TABLET TWICE DAILY AS NEEDED FOR SYSTOLIC BLOOD PRESSURE OVER 170  . ANORO ELLIPTA 62.5-25 MCG/INH AEPB TAKE 1 PUFF BY MOUTH EVERY DAY (Patient taking differently: Inhale 1 puff into the lungs daily. )  . aspirin 81 MG tablet Take 81 mg by mouth daily.  [name] atorvastatin (LIPITOR) 80 MG tablet TAKE 1 TABLET BY MOUTH EVERY DAY (Patient taking differently: Take 80 mg by mouth daily at 6 PM. )  . azelastine (ASTELIN) 0.1 % nasal spray SPRAY 2 SPRAYS INTO EACH NOSTRIL EVERY DAY (Patient taking differently: Place 2 sprays into both nostrils daily. SPRAY 2 SPRAYS INTO EACH NOSTRIL EVERY DAY)  . Calcium Carbonate (CALCIUM 600 PO) Take 1 tablet by mouth daily.  . cetirizine (ZYRTEC) 10 MG tablet Take 10 mg by mouth daily as needed for allergies.   . cholecalciferol (VITAMIN D3) 25 MCG (1000 UNIT) tablet Take 1,000 Units by mouth daily.  . furosemide (LASIX) 40 MG tablet Take 40 mg by mouth as needed (for swelling).   [name]  levothyroxine (SYNTHROID) 112 MCG tablet TAKE ONE TABLET BY MOUTH SIX DAYS PER WEEK. TAKE 1 AND 1/2 TABLET BY MOUTH ONE DAY PER WEEK.  . Multiple Vitamin (MULTIVITAMIN) tablet Take 1 tablet by mouth daily.  . nitroGLYCERIN (NITROSTAT) 0.4 MG SL tablet Place 0.4 mg under the tongue every 5 (five) minutes as needed for chest pain.   . Omega-3 Fatty Acids (FISH OIL) 1000 MG CAPS Take 4 capsules by mouth daily.  . psyllium (METAMUCIL) 0.52 g capsule Take 0.52 g by mouth daily. Fiber capsules  . quinapril (ACCUPRIL) 40 MG tablet TAKE 1 TABLET BY MOUTH EVERY DAY  (Patient taking differently: Take 40 mg by mouth daily. )  . zolpidem (AMBIEN) 5 MG tablet TAKE 1 TABLET (5 MG TOTAL) BY MOUTH AT BEDTIME AS NEEDED FOR SLEEP.  [name] levothyroxine (SYNTHROID, LEVOTHROID) 150 MCG tablet TAKE 1 TABLET BY MOUTH DAILY BEFORE BREAKFAST (Patient not taking: No sig reported)  . montelukast (SINGULAIR) 10 MG tablet Take 1 tablet (10 mg total) by mouth at bedtime. (Patient not taking: Reported on 08/28/2017)  . omeprazole (PRILOSEC) 20 MG capsule Take 1 capsule (20 mg total) by mouth daily. (Patient not taking: Reported on 09/03/2018)  . [DISCONTINUED] nitrofurantoin, macrocrystal-monohydrate, (MACROBID) 100 MG capsule Take 1 capsule (100 mg total) by mouth 2 (two) times daily. (Patient not taking: Reported on 07/23/2019)   No facility-administered medications prior to visit.    Review of Systems  Constitutional: Negative.   HENT: Negative.   Eyes: Negative.   Respiratory: Negative.   Cardiovascular: Negative.   Gastrointestinal: Negative.   Endocrine: Negative.   Genitourinary: Negative.   Allergic/Immunologic: Negative.   Neurological: Negative.   Psychiatric/Behavioral: Negative.     Last lipids Lab Results  Component Value Date   CHOL 184 09/05/2018   HDL 89 09/05/2018   LDLCALC 80 09/05/2018   TRIG 77 09/05/2018   CHOLHDL 2.1 09/05/2018      Objective    BP (!) 134/55 (BP Location: Right Arm, Patient Position: Sitting, Cuff Size: Large)   Pulse (!) 52   Temp (!) 97.1 F (36.2 C) (Temporal)   Ht 5\' 7"  (1.702 m)   Wt 179 lb (81.2 kg)   BMI 28.04 kg/m  BP Readings from Last 3 Encounters:  09/10/19 (!) 134/55  07/23/19 (!) 178/82  09/05/18 120/70   Wt Readings from Last 3 Encounters:  09/10/19 179 lb (81.2 kg)  07/23/19 175 lb (79.4 kg)  09/05/18 174 lb 3.2 oz (79 kg)      Physical Exam Vitals reviewed.  Constitutional:      Appearance: She is well-developed.  HENT:     Head: Normocephalic and atraumatic.  Eyes:     General: No scleral  icterus.    Conjunctiva/sclera: Conjunctivae normal.  Neck:     Thyroid: No thyromegaly.  Cardiovascular:     Rate and Rhythm: Normal rate and regular rhythm.     Heart sounds: Normal heart sounds.  Pulmonary:     Effort: Pulmonary effort is normal.     Breath sounds: Normal breath sounds.  Abdominal:     Palpations: Abdomen is soft.  Skin:    General: Skin is warm and dry.  Neurological:     General: No focal deficit present.     Mental Status: She is alert and oriented to person, place, and time.  Psychiatric:        Mood and Affect: Mood normal.        Behavior: Behavior normal.  Thought Content: Thought content normal.        Judgment: Judgment normal.       No results found for any visits on 09/10/19.  Assessment & Plan     1. Annual physical exam Last colonoscopy 11/20/2009, refer to Dr. Lemar Livings who did her last one which was normal.  2. Essential hypertension Make sure the patient to take amlodipine 5 mg daily. - amLODipine (NORVASC) 5 MG tablet; TAKE 1 TABLET DAILY  Dispense: 90 tablet; Refill: 4 - quinapril (ACCUPRIL) 40 MG tablet; Take 1 tablet (40 mg total) by mouth daily.  Dispense: 90 tablet; Refill: 4  3. Acquired hypothyroidism  - levothyroxine (SYNTHROID) 112 MCG tablet; TAKE ONE TABLET BY MOUTH SIX DAYS PER WEEK. TAKE 1 AND 1/2 TABLET BY MOUTH ONE DAY PER WEEK.  Dispense: 100 tablet; Refill: 4  4. Pure hypercholesterolemia  - atorvastatin (LIPITOR) 80 MG tablet; Take 1 tablet (80 mg total) by mouth daily.  Dispense: 90 tablet; Refill: 4  5. Screening for colon cancer  - Ambulatory referral to Gastroenterology  6. Arteriosclerosis of coronary artery Risk factors treated.  CAD followed by Dr. Juliann Pares.   No follow-ups on file.      I, Penny Mans, MD, have reviewed all documentation for this visit. The documentation on 09/13/19 for the exam, diagnosis, procedures, and orders are all accurate and complete.    Ritaj Dullea Wendelyn Breslow,  MD  [facility] [phone] (phone) [phone] (fax)  [facility]

## 2019-09-09 ENCOUNTER — Encounter: Payer: Self-pay | Admitting: Family Medicine

## 2019-09-09 ENCOUNTER — Other Ambulatory Visit: Payer: Self-pay | Admitting: Family Medicine

## 2019-09-09 DIAGNOSIS — I25810 Atherosclerosis of coronary artery bypass graft(s) without angina pectoris: Secondary | ICD-10-CM | POA: Diagnosis not present

## 2019-09-09 DIAGNOSIS — Z1231 Encounter for screening mammogram for malignant neoplasm of breast: Secondary | ICD-10-CM

## 2019-09-09 DIAGNOSIS — R0602 Shortness of breath: Secondary | ICD-10-CM | POA: Diagnosis not present

## 2019-09-10 ENCOUNTER — Ambulatory Visit (INDEPENDENT_AMBULATORY_CARE_PROVIDER_SITE_OTHER): Payer: Medicare HMO | Admitting: Family Medicine

## 2019-09-10 ENCOUNTER — Encounter: Payer: Self-pay | Admitting: Family Medicine

## 2019-09-10 ENCOUNTER — Other Ambulatory Visit: Payer: Self-pay

## 2019-09-10 VITALS — BP 134/55 | HR 52 | Temp 97.1°F | Ht 67.0 in | Wt 179.0 lb

## 2019-09-10 DIAGNOSIS — E7800 Pure hypercholesterolemia, unspecified: Secondary | ICD-10-CM

## 2019-09-10 DIAGNOSIS — E039 Hypothyroidism, unspecified: Secondary | ICD-10-CM

## 2019-09-10 DIAGNOSIS — I10 Essential (primary) hypertension: Secondary | ICD-10-CM | POA: Diagnosis not present

## 2019-09-10 DIAGNOSIS — I2510 Atherosclerotic heart disease of native coronary artery without angina pectoris: Secondary | ICD-10-CM

## 2019-09-10 DIAGNOSIS — Z1211 Encounter for screening for malignant neoplasm of colon: Secondary | ICD-10-CM | POA: Diagnosis not present

## 2019-09-10 DIAGNOSIS — Z0000 Encounter for general adult medical examination without abnormal findings: Secondary | ICD-10-CM | POA: Diagnosis not present

## 2019-09-10 MED ORDER — ATORVASTATIN CALCIUM 80 MG PO TABS: 80.0000 mg | ORAL_TABLET | Freq: Every day | ORAL | 4 refills | Status: DC

## 2019-09-10 MED ORDER — AMLODIPINE BESYLATE 5 MG PO TABS: ORAL_TABLET | ORAL | 4 refills | Status: DC

## 2019-09-10 MED ORDER — LEVOTHYROXINE SODIUM 112 MCG PO TABS: ORAL_TABLET | ORAL | 4 refills | Status: DC

## 2019-09-10 MED ORDER — QUINAPRIL HCL 40 MG PO TABS: 40.0000 mg | ORAL_TABLET | Freq: Every day | ORAL | 4 refills | Status: DC

## 2019-09-10 NOTE — Patient Instructions (Signed)
TAKE 1 TABLET OF AMLODIPINE 5 MG DAILY!!!

## 2019-09-16 DIAGNOSIS — R0989 Other specified symptoms and signs involving the circulatory and respiratory systems: Secondary | ICD-10-CM | POA: Diagnosis not present

## 2019-09-16 DIAGNOSIS — E782 Mixed hyperlipidemia: Secondary | ICD-10-CM | POA: Diagnosis not present

## 2019-09-16 DIAGNOSIS — Z951 Presence of aortocoronary bypass graft: Secondary | ICD-10-CM | POA: Diagnosis not present

## 2019-09-16 DIAGNOSIS — I10 Essential (primary) hypertension: Secondary | ICD-10-CM | POA: Diagnosis not present

## 2019-09-16 DIAGNOSIS — I208 Other forms of angina pectoris: Secondary | ICD-10-CM | POA: Diagnosis not present

## 2019-09-16 DIAGNOSIS — R0600 Dyspnea, unspecified: Secondary | ICD-10-CM | POA: Diagnosis not present

## 2019-09-16 DIAGNOSIS — J449 Chronic obstructive pulmonary disease, unspecified: Secondary | ICD-10-CM | POA: Diagnosis not present

## 2019-09-16 DIAGNOSIS — E079 Disorder of thyroid, unspecified: Secondary | ICD-10-CM | POA: Diagnosis not present

## 2019-09-16 DIAGNOSIS — R000 Tachycardia, unspecified: Secondary | ICD-10-CM | POA: Diagnosis not present

## 2019-09-16 DIAGNOSIS — I2510 Atherosclerotic heart disease of native coronary artery without angina pectoris: Secondary | ICD-10-CM | POA: Diagnosis not present

## 2019-10-03 ENCOUNTER — Other Ambulatory Visit: Payer: Self-pay | Admitting: General Surgery

## 2019-10-03 DIAGNOSIS — Z1211 Encounter for screening for malignant neoplasm of colon: Secondary | ICD-10-CM | POA: Diagnosis not present

## 2019-10-10 DIAGNOSIS — I6523 Occlusion and stenosis of bilateral carotid arteries: Secondary | ICD-10-CM | POA: Diagnosis not present

## 2019-10-10 DIAGNOSIS — R0989 Other specified symptoms and signs involving the circulatory and respiratory systems: Secondary | ICD-10-CM | POA: Diagnosis not present

## 2019-10-19 ENCOUNTER — Other Ambulatory Visit: Payer: Self-pay | Admitting: Family Medicine

## 2019-10-19 NOTE — Telephone Encounter (Signed)
Requested Prescriptions  Pending Prescriptions Disp Refills  . ANORO ELLIPTA 62.5-25 MCG/INH AEPB [Pharmacy Med Name: Ernestina Patches 62.5-25 MCG INH] 120 each 3    Sig: INHALE 1 PUFF BY MOUTH EVERY DAY     Pulmonology:  Comb[employer] Products Passed - 10/19/2019  9:37 AM      Passed - Valid encounter within last 12 months    Recent Outpatient Visits          1 month ago Annual physical exam   [facility] Maple Hudson., MD   10 months ago Acute cystitis without hematuria   [facility] Maple Hudson., MD   1 year ago Encounter for annual physical exam   [facility] Maple Hudson., MD   1 year ago Arteriosclerosis of coronary artery   [facility] Maple Hudson., MD   2 years ago Tachycardia   [facility] Maple Hudson., MD      Future Appointments            In 3 weeks Maple Hudson., MD [facility]

## 2019-10-21 ENCOUNTER — Encounter: Payer: Self-pay | Admitting: Family Medicine

## 2019-10-21 ENCOUNTER — Ambulatory Visit (INDEPENDENT_AMBULATORY_CARE_PROVIDER_SITE_OTHER): Payer: Medicare HMO | Admitting: Family Medicine

## 2019-10-21 ENCOUNTER — Other Ambulatory Visit: Payer: Self-pay

## 2019-10-21 VITALS — BP 124/60 | HR 52 | Temp 97.3°F | Wt 178.0 lb

## 2019-10-21 DIAGNOSIS — H7291 Unspecified perforation of tympanic membrane, right ear: Secondary | ICD-10-CM | POA: Diagnosis not present

## 2019-10-21 NOTE — Patient Instructions (Signed)
Eardrum Rupture, Adult  An eardrum rupture is a hole (perforation) in the eardrum. The eardrum is a thin, round tissue inside of the ear that separates the ear canal from the middle ear. The eardrum is also called the tympanic membrane. It transfers sound vibrations through small bones in the middle ear to the hearing nerve in the inner ear. It also protects the middle ear from germs. An eardrum rupture can cause pain and hearing loss. What are the causes? This condition may be caused by:  An infection.  A sudden injury, such as from: ? Inserting a thin, sharp object into the ear. ? A hit to the side of the head, especially by an open hand. ? Falling onto water or a flat surface. ? A rapid change in pressure, such as from flying or scuba diving. ? A sudden increase in pressure against the eardrum, such as from an explosion or a very loud noise.  Inserting a cotton-tipped swab in the ear.  A long-term eustachian tube disorder. Eustachian tubes are parts of the body that connect each middle ear space to the back of the nose.  A medical procedure or surgery, such as a procedure to remove wax from the ear canal.  Removing a man-made pressure equalization tube(PE tube) that was placed through the eardrum.  Having a PE tube fall out. What increases the risk? You are more likely to develop this condition if:  You have had PE tubes inserted in your ears.  You have an ear infection.  You play sports that: ? Involve balls or contact with other players. ? Take place in water, such as diving, scuba diving, or waterskiing. What are the signs or symptoms? Symptoms of this condition include:  Sudden pain at the time of the injury.  Ear pain that suddenly improves.  Ringing in the ear after the injury.  Drainage from the ear. The drainage may be clear, cloudy or pus-like, or bloody.  Hearing loss.  Dizziness. How is this diagnosed? This condition is diagnosed based on your symptoms  and medical history as well as a physical exam. Your health care provider can usually see a perforation using an ear scope (otoscope). You may have tests, such as:  A hearing test (audiogram) to check for hearing loss.  A test in which a sample of ear drainage is tested for infection (culture). How is this treated? An eardrum typically heals on its own within a few weeks. If your eardrum does not heal, your health care provider may recommend a procedure to place a patch over your eardrum or surgery to repair your eardrum. Your health care provider may also prescribe antibiotic medicines to help prevent infection. If the ear heals completely, any hearing loss should be temporary. Follow these instructions at home:  Keep your ear dry. This is very important. Follow instructions from your health care provider about how to keep your ear dry. You may need to wear waterproof earplugs when bathing and swimming.  Take over-the-counter and prescription medicines only as told by your health care provider.  Return to sports and activities as told by your health care provider. Ask your health care provider what activities are safe for you.  Wear headgear with ear protection when you play sports in which ear injuries are common.  If directed, apply heat to your affected ear as often as told by your health care provider. Use the heat source that your health care provider recommends, such as a moist heat pack   or a heating pad. This will help to relieve pain. ? Place a towel between your skin and the heat source. ? Leave the heat on for 20-30 minutes. ? Remove the heat if your skin turns bright red. This is especially important if you are unable to feel pain, heat, or cold. You may have a greater risk of getting burned.  Keep all follow-up visits as told by your health care provider. This is important.  Talk to your health care provider before traveling by plane. Contact a health care provider if:  You  have mucus or blood draining from your ear.  You have a fever.  You have ear pain.  You have hearing loss, dizziness, or ringing in your ear. Get help right away if:  You have sudden hearing loss.  You are very dizzy.  You have severe ear pain.  Your face feels weak or becomes paralyzed. Summary  An eardrum rupture is a hole (perforation) in the eardrum that can cause pain and hearing loss. It is usually caused by a sudden injury to the ear.  The eardrum will likely heal on its own within a few weeks. In some cases, surgery may be necessary.  After the injury, follow instructions from your health care provider about how to keep your ear dry as it heals. This information is not intended to replace advice given to you by your health care provider. Make sure you discuss any questions you have with your health care provider. Document Revised: 03/24/2017 Document Reviewed: 06/17/2016 Elsevier Patient Education  2020 Elsevier Inc.  

## 2019-10-21 NOTE — Progress Notes (Signed)
Established patient visit   Patient: Penny Reed   DOB: 1945/10/04   74 y.o. Female  MRN: [id number] Visit Date: 10/21/2019  Today's healthcare provider: Vernie Murders, PA   Chief Complaint  Patient presents with  . Ear Pain    Right ear; started 10/18/2019   Subjective    Otalgia  There is pain in the right ear. This is a new problem. The current episode started in the past 7 days. Progression since onset: Pain has improved, but drainage has not changed. Associated symptoms include ear discharge [facility] discharge) and hearing loss. Pertinent negatives include no abdominal pain, coughing, diarrhea, headaches, rhinorrhea or sore throat.    Patient Active Problem List   Diagnosis Date Noted  . Ascending aortic aneurysm ([place]) 07/23/2019  . Insomnia 01/26/2018  . Angina pectoris ([place]) 12/09/2014  . BP (high blood pressure) 12/09/2014  . Adiposity 12/09/2014  . Dyspnea 09/25/2014  . Abnormal respiratory rate 09/25/2014  . Acquired hypothyroidism 09/21/2014  . Allergic rhinitis 09/21/2014  . Arteriosclerosis of coronary artery 09/21/2014  . Essential (primary) hypertension 09/21/2014  . HLD (hyperlipidemia) 09/21/2014  . Cardiomegaly 09/21/2014  . Familial multiple lipoprotein-type hyperlipidemia 09/21/2014  . CAFL (chronic airflow limitation) ([place]) 08/06/2013  . Acid reflux 08/06/2013  . Breath shortness 08/06/2013  . Disease of thyroid gland 08/06/2013  . Tobacco abuse 06/21/2013  . COPD, moderate ([place]) 05/24/2013   Past Medical History:  Diagnosis Date  . Allergic rhinitis   . CAD (coronary artery disease)   . Hyperlipidemia   . Hypertension   . Hypothyroidism   . LVH (left ventricular hypertrophy)    Past Surgical History:  Procedure Laterality Date  . ABDOMINAL HYSTERECTOMY     has left ovary  . APPENDECTOMY    . CARDIAC CATHETERIZATION  09/2005   had 1 stent and 3 balloons placed  . CORONARY ARTERY BYPASS GRAFT    . TONSILLECTOMY     Family  History  Problem Relation Age of Onset  . Diabetes Mother   . Stroke Mother        cause of death  . Coronary artery disease Father   . Heart attack Father   . Hyperlipidemia Sister   . Hypertension Sister   . Breast cancer Sister 13  . Cancer Sister        breast cancer  . Asthma Sister   . Scleroderma Sister   . Thyroid disease Sister   . Heart attack Brother   . Cancer - Colon Paternal Grandmother   . Breast cancer Maternal Aunt        55s   Social History   Tobacco Use  . Smoking status: Former Smoker    Packs/day: 0.25    Years: 25.00    Pack years: 6.25    Types: Cigarettes    Quit date: 04/25/2005    Years since quitting: 14.4  . Smokeless tobacco: Never Used  Vaping Use  . Vaping Use: Never used  Substance Use Topics  . Alcohol use: Yes    Alcohol/week: 5.0 standard drinks    Types: 5 Glasses of wine per week    Comment: spread out between 2-3 nights a week  . Drug use: No   Allergies  Allergen Reactions  . Adhesive [Tape]     Band-aids irritate skin  . Azithromycin Diarrhea and Nausea And Vomiting     Medications: Outpatient Medications Prior to Visit  Medication Sig  . albuterol (PROAIR HFA) 108 (90  BASE) MCG/ACT inhaler Inhale 2 puffs into the lungs every 6 (six) hours as needed for wheezing or shortness of breath.  [name] amLODipine (NORVASC) 5 MG tablet TAKE 1 TABLET DAILY  . ANORO ELLIPTA 62.5-25 MCG/INH AEPB INHALE 1 PUFF BY MOUTH EVERY DAY  . aspirin 81 MG tablet Take 81 mg by mouth daily.  [name] atorvastatin (LIPITOR) 80 MG tablet Take 1 tablet (80 mg total) by mouth daily.  [name] azelastine (ASTELIN) 0.1 % nasal spray SPRAY 2 SPRAYS INTO EACH NOSTRIL EVERY DAY (Patient taking differently: Place 2 sprays into both nostrils daily. SPRAY 2 SPRAYS INTO EACH NOSTRIL EVERY DAY)  . Calcium Carbonate (CALCIUM 600 PO) Take 1 tablet by mouth daily.  . cetirizine (ZYRTEC) 10 MG tablet Take 10 mg by mouth daily as needed for allergies.   . cholecalciferol (VITAMIN D3)  25 MCG (1000 UNIT) tablet Take 1,000 Units by mouth daily.  . furosemide (LASIX) 40 MG tablet Take 40 mg by mouth as needed (for swelling).   [name] levothyroxine (SYNTHROID) 112 MCG tablet TAKE ONE TABLET BY MOUTH SIX DAYS PER WEEK. TAKE 1 AND 1/2 TABLET BY MOUTH ONE DAY PER WEEK.  . Multiple Vitamin (MULTIVITAMIN) tablet Take 1 tablet by mouth daily.  . nitroGLYCERIN (NITROSTAT) 0.4 MG SL tablet Place 0.4 mg under the tongue every 5 (five) minutes as needed for chest pain.   . Omega-3 Fatty Acids (FISH OIL) 1000 MG CAPS Take 4 capsules by mouth daily.  . psyllium (METAMUCIL) 0.52 g capsule Take 0.52 g by mouth daily. Fiber capsules  . quinapril (ACCUPRIL) 40 MG tablet Take 1 tablet (40 mg total) by mouth daily.  [name] zolpidem (AMBIEN) 5 MG tablet TAKE 1 TABLET (5 MG TOTAL) BY MOUTH AT BEDTIME AS NEEDED FOR SLEEP.  [name] montelukast (SINGULAIR) 10 MG tablet Take 1 tablet (10 mg total) by mouth at bedtime. (Patient not taking: Reported on 08/28/2017)  . omeprazole (PRILOSEC) 20 MG capsule Take 1 capsule (20 mg total) by mouth daily. (Patient not taking: Reported on 09/03/2018)   No facility-administered medications prior to visit.   Review of Systems  Constitutional: Negative.   HENT: Positive for ear discharge [facility] discharge), ear pain, hearing loss and mouth sores. Negative for congestion, postnasal drip, rhinorrhea, sinus pressure, sinus pain, sore throat and tinnitus.   Eyes: Positive for discharge (Right eye.). Negative for photophobia, pain, redness, itching and visual disturbance.  Respiratory: Negative for cough.   Gastrointestinal: Negative.  Negative for abdominal pain and diarrhea.  Neurological: Negative for dizziness, light-headedness and headaches.    Objective    BP 124/60 (BP Location: Right Arm, Patient Position: Sitting, Cuff Size: Large)   Pulse (!) 52   Temp (!) 97.3 F (36.3 C) (Temporal)   Wt 178 lb (80.7 kg)   SpO2 98%   BMI 27.88 kg/m   Physical Exam Constitutional:       General: She is not in acute distress.    Appearance: She is well-developed.  HENT:     Head: Normocephalic and atraumatic.     Right Ear: Hearing normal.     Left Ear: Hearing, tympanic membrane and ear canal normal.     Ears:     Comments: Bloody right TM and ear canal.    Nose: Nose normal.  Eyes:     General: Lids are normal. No scleral icterus.       Right eye: No discharge.        Left eye: No discharge.  Conjunctiva/sclera: Conjunctivae normal.  Pulmonary:     Effort: Pulmonary effort is normal. No respiratory distress.  Musculoskeletal:        General: Normal range of motion.  Skin:    Findings: No lesion or rash.  Neurological:     Mental Status: She is alert and oriented to person, place, and time.  Psychiatric:        Speech: Speech normal.        Behavior: Behavior normal.        Thought Content: Thought content normal.    No results found for any visits on 10/21/19.  Assessment & Plan     1. Perforation of right tympanic membrane Sudden spontaneous pain and bloody discharge from the right ear 2 days ago. Pain lasted only a few seconds but still having bloody discharge at night. No recent URI symptoms, fever or known injury. No dizziness and hearing diminished/"stopped up sensation". Bloody TM and dried blood in ear canal on exam. Refer to ENT. - Ambulatory referral to ENT   No follow-ups on file.      Haywood Pao, PA, have reviewed all documentation for this visit. The documentation on 10/21/19 for the exam, diagnosis, procedures, and orders are all accurate and complete.    Dortha Kern, PA  [facility] [phone] (phone) [phone] (fax)  [facility]

## 2019-10-22 DIAGNOSIS — K219 Gastro-esophageal reflux disease without esophagitis: Secondary | ICD-10-CM | POA: Diagnosis not present

## 2019-10-22 DIAGNOSIS — R0600 Dyspnea, unspecified: Secondary | ICD-10-CM | POA: Diagnosis not present

## 2019-10-22 DIAGNOSIS — I10 Essential (primary) hypertension: Secondary | ICD-10-CM | POA: Diagnosis not present

## 2019-10-22 DIAGNOSIS — Z951 Presence of aortocoronary bypass graft: Secondary | ICD-10-CM | POA: Diagnosis not present

## 2019-10-22 DIAGNOSIS — I2510 Atherosclerotic heart disease of native coronary artery without angina pectoris: Secondary | ICD-10-CM | POA: Diagnosis not present

## 2019-10-22 DIAGNOSIS — I208 Other forms of angina pectoris: Secondary | ICD-10-CM | POA: Diagnosis not present

## 2019-10-22 DIAGNOSIS — E782 Mixed hyperlipidemia: Secondary | ICD-10-CM | POA: Diagnosis not present

## 2019-10-22 DIAGNOSIS — J449 Chronic obstructive pulmonary disease, unspecified: Secondary | ICD-10-CM | POA: Diagnosis not present

## 2019-10-22 DIAGNOSIS — R0989 Other specified symptoms and signs involving the circulatory and respiratory systems: Secondary | ICD-10-CM | POA: Diagnosis not present

## 2019-10-24 ENCOUNTER — Ambulatory Visit
Admission: RE | Admit: 2019-10-24 | Discharge: 2019-10-24 | Disposition: A | Discharge status: Home / Self Care | Payer: Medicare HMO | Source: Ambulatory Visit | Attending: Family Medicine | Admitting: Family Medicine

## 2019-10-24 DIAGNOSIS — Z1231 Encounter for screening mammogram for malignant neoplasm of breast: Secondary | ICD-10-CM | POA: Diagnosis not present

## 2019-10-25 DIAGNOSIS — H73001 Acute myringitis, right ear: Secondary | ICD-10-CM | POA: Diagnosis not present

## 2019-10-25 DIAGNOSIS — H6121 Impacted cerumen, right ear: Secondary | ICD-10-CM | POA: Diagnosis not present

## 2019-11-06 ENCOUNTER — Encounter: Payer: Self-pay | Admitting: Family Medicine

## 2019-11-11 NOTE — Progress Notes (Signed)
Penny Reed,acting as a scribe for Penny Mans, MD.,have documented all relevant documentation on the behalf of Penny Mans, MD,as directed by  Penny Mans, MD while in the presence of Penny Mans, MD.  Established patient visit   Patient: Penny Reed   DOB: 26-Dec-1945   74 y.o. Female  MRN: [id number] Visit Date: 11/13/2019  Today's healthcare provider: [name], MD   Chief Complaint  Patient presents with  . Hypertension   Subjective    HPI  Patient has no complaints and feels well.  She is taking her medications. Patient would like to have her right ear looked at and crooked toe on left foot.  Hypertension, follow-up  BP Readings from Last 3 Encounters:  11/13/19 139/61  10/21/19 124/60  09/10/19 (!) 134/55   Wt Readings from Last 3 Encounters:  11/13/19 179 lb 12.8 oz (81.6 kg)  10/21/19 178 lb (80.7 kg)  09/10/19 179 lb (81.2 kg)     She was last seen for hypertension 2 months ago.  BP at that visit was 134/55. Management since that visit includes; Make sure the patient is taking amlodipine 5 mg daily. She reports excellent compliance with treatment. She is not having side effects.  She is exercising. She is adherent to low salt diet.   Outside blood pressures are 124/64  She does not smoke.  Use of agents associated with hypertension: none.   ---------------------------------------------------------------------------------------------------       Medications: Outpatient Medications Prior to Visit  Medication Sig  . albuterol (PROAIR HFA) 108 (90 BASE) MCG/ACT inhaler Inhale 2 puffs into the lungs every 6 (six) hours as needed for wheezing or shortness of breath.  [name] amLODipine (NORVASC) 5 MG tablet TAKE 1 TABLET DAILY  . ANORO ELLIPTA 62.5-25 MCG/INH AEPB INHALE 1 PUFF BY MOUTH EVERY DAY  . aspirin 81 MG tablet Take 81 mg by mouth daily.  [name] atorvastatin (LIPITOR) 80 MG tablet Take 1 tablet (80 mg total)  by mouth daily.  [name] azelastine (ASTELIN) 0.1 % nasal spray SPRAY 2 SPRAYS INTO EACH NOSTRIL EVERY DAY (Patient taking differently: Place 2 sprays into both nostrils daily. SPRAY 2 SPRAYS INTO EACH NOSTRIL EVERY DAY)  . Calcium Carbonate (CALCIUM 600 PO) Take 1 tablet by mouth daily.  . cetirizine (ZYRTEC) 10 MG tablet Take 10 mg by mouth daily as needed for allergies.   . cholecalciferol (VITAMIN D3) 25 MCG (1000 UNIT) tablet Take 1,000 Units by mouth daily.  . furosemide (LASIX) 40 MG tablet Take 40 mg by mouth as needed (for swelling).   [name] levothyroxine (SYNTHROID) 112 MCG tablet TAKE ONE TABLET BY MOUTH SIX DAYS PER WEEK. TAKE 1 AND 1/2 TABLET BY MOUTH ONE DAY PER WEEK.  . Multiple Vitamin (MULTIVITAMIN) tablet Take 1 tablet by mouth daily.  . nitroGLYCERIN (NITROSTAT) 0.4 MG SL tablet Place 0.4 mg under the tongue every 5 (five) minutes as needed for chest pain.   . Omega-3 Fatty Acids (FISH OIL) 1000 MG CAPS Take 4 capsules by mouth daily.  . psyllium (METAMUCIL) 0.52 g capsule Take 0.52 g by mouth daily. Fiber capsules  . quinapril (ACCUPRIL) 40 MG tablet Take 1 tablet (40 mg total) by mouth daily.  [name] zolpidem (AMBIEN) 5 MG tablet TAKE 1 TABLET (5 MG TOTAL) BY MOUTH AT BEDTIME AS NEEDED FOR SLEEP.  [name] montelukast (SINGULAIR) 10 MG tablet Take 1 tablet (10 mg total) by mouth at bedtime. (Patient not taking: Reported on 08/28/2017)  .  omeprazole (PRILOSEC) 20 MG capsule Take 1 capsule (20 mg total) by mouth daily. (Patient not taking: Reported on 09/03/2018)   No facility-administered medications prior to visit.    Review of Systems  Constitutional: Negative for appetite change, chills, fatigue and fever.  Respiratory: Negative for chest tightness and shortness of breath.   Cardiovascular: Negative for chest pain and palpitations.  Gastrointestinal: Negative for abdominal pain, nausea and vomiting.  Endocrine: Negative.   Allergic/Immunologic: Negative.   Neurological: Negative for dizziness  and weakness.  Psychiatric/Behavioral: Negative.        Objective    BP 139/61 (BP Location: Left Arm, Patient Position: Sitting, Cuff Size: Normal)   Pulse (!) 48   Temp (!) 97.1 F (36.2 C) (Temporal)   Ht 5\' 7"  (1.702 m)   Wt 179 lb 12.8 oz (81.6 kg)   BMI 28.16 kg/m     Physical Exam Vitals reviewed.  Constitutional:      Appearance: She is well-developed.  HENT:     Head: Normocephalic and atraumatic.  Eyes:     General: No scleral icterus.    Conjunctiva/sclera: Conjunctivae normal.  Neck:     Thyroid: No thyromegaly.  Cardiovascular:     Rate and Rhythm: Normal rate and regular rhythm.     Heart sounds: Normal heart sounds.  Pulmonary:     Effort: Pulmonary effort is normal.     Breath sounds: Normal breath sounds.  Abdominal:     Palpations: Abdomen is soft.  Skin:    General: Skin is warm and dry.  Neurological:     General: No focal deficit present.     Mental Status: She is alert and oriented to person, place, and time.  Psychiatric:        Mood and Affect: Mood normal.        Behavior: Behavior normal.        Thought Content: Thought content normal.        Judgment: Judgment normal.       No results found for any visits on 11/13/19.  Assessment & Plan     1. Arteriosclerosis of coronary artery Patient status post CABG. All risk factors treated  2. Essential hypertension On amlodipine and quinapril.  3. Pure hypercholesterolemia On atorvastatin 80  4. Acquired hypothyroidism Per endocrinology   No follow-ups on file.         Avonda Toso 11/15/19, MD  [facility] [phone] (phone) [phone] (fax)  [facility]

## 2019-11-13 ENCOUNTER — Ambulatory Visit (INDEPENDENT_AMBULATORY_CARE_PROVIDER_SITE_OTHER): Payer: Medicare HMO | Admitting: Family Medicine

## 2019-11-13 ENCOUNTER — Encounter: Payer: Self-pay | Admitting: Family Medicine

## 2019-11-13 ENCOUNTER — Other Ambulatory Visit: Payer: Self-pay

## 2019-11-13 VITALS — BP 139/61 | HR 48 | Temp 97.1°F | Ht 67.0 in | Wt 179.8 lb

## 2019-11-13 DIAGNOSIS — E7800 Pure hypercholesterolemia, unspecified: Secondary | ICD-10-CM

## 2019-11-13 DIAGNOSIS — I10 Essential (primary) hypertension: Secondary | ICD-10-CM | POA: Diagnosis not present

## 2019-11-13 DIAGNOSIS — I2510 Atherosclerotic heart disease of native coronary artery without angina pectoris: Secondary | ICD-10-CM | POA: Diagnosis not present

## 2019-11-13 DIAGNOSIS — E039 Hypothyroidism, unspecified: Secondary | ICD-10-CM | POA: Diagnosis not present

## 2019-11-21 ENCOUNTER — Telehealth: Payer: Self-pay | Admitting: *Deleted

## 2019-11-21 NOTE — Chronic Care Management (AMB) (Signed)
  Chronic Care Management   Note  11/21/2019 Name: Penny Reed MRN: [id number] DOB: Sep 28, 1945  Penny Reed is a 74 y.o. year old female who is a primary care patient of Jerrol Banana., MD. I reached out to [name] by phone today in response to a referral sent by [name]'s health plan.     [name] was given information about Chronic Care Management services today including:  1. CCM service includes personalized support from designated clinical staff supervised by her physician, including individualized plan of care and coordination with other care providers 2. 24/7 contact phone numbers for assistance for urgent and routine care needs. 3. Service will only be billed when office clinical staff spend 20 minutes or more in a month to coordinate care. 4. Only one practitioner may furnish and bill the service in a calendar month. 5. The patient may stop CCM services at any time (effective at the end of the month) by phone call to the office staff. 6. The patient will be responsible for cost sharing (co-pay) of up to 20% of the service fee (after annual deductible is met).  Patient agreed to services and verbal consent obtained.   Follow up plan: Telephone appointment with care management team member scheduled for:12/06/2019.  [place], [place] [address] Direct Dial: [phone] [email or web address]: [email or web address]

## 2019-11-27 ENCOUNTER — Other Ambulatory Visit: Payer: Medicare HMO

## 2019-12-02 ENCOUNTER — Other Ambulatory Visit: Payer: Self-pay

## 2019-12-02 ENCOUNTER — Other Ambulatory Visit
Admission: RE | Admit: 2019-12-02 | Discharge: 2019-12-02 | Disposition: A | Discharge status: Home / Self Care | Payer: Medicare HMO | Source: Ambulatory Visit | Attending: General Surgery | Admitting: General Surgery

## 2019-12-02 DIAGNOSIS — Z20822 Contact with and (suspected) exposure to covid-19: Secondary | ICD-10-CM | POA: Diagnosis not present

## 2019-12-02 DIAGNOSIS — Z01812 Encounter for preprocedural laboratory examination: Secondary | ICD-10-CM | POA: Diagnosis not present

## 2019-12-03 ENCOUNTER — Encounter: Payer: Self-pay | Admitting: General Surgery

## 2019-12-03 LAB — SARS CORONAVIRUS 2 (TAT 6-24 HRS): SARS Coronavirus 2: NEGATIVE

## 2019-12-04 ENCOUNTER — Ambulatory Visit: Payer: Medicare HMO | Admitting: Certified Registered Nurse Anesthetist

## 2019-12-04 ENCOUNTER — Other Ambulatory Visit: Payer: Self-pay

## 2019-12-04 ENCOUNTER — Encounter: Payer: Self-pay | Admitting: General Surgery

## 2019-12-04 ENCOUNTER — Encounter
Admission: RE | Disposition: A | Discharge status: Home / Self Care | Payer: Self-pay | Source: Ambulatory Visit | Attending: General Surgery

## 2019-12-04 ENCOUNTER — Ambulatory Visit
Admission: RE | Admit: 2019-12-04 | Discharge: 2019-12-04 | Disposition: A | Discharge status: Home / Self Care | Payer: Medicare HMO | Source: Ambulatory Visit | Attending: General Surgery | Admitting: General Surgery

## 2019-12-04 DIAGNOSIS — J449 Chronic obstructive pulmonary disease, unspecified: Secondary | ICD-10-CM | POA: Diagnosis not present

## 2019-12-04 DIAGNOSIS — I2510 Atherosclerotic heart disease of native coronary artery without angina pectoris: Secondary | ICD-10-CM | POA: Insufficient documentation

## 2019-12-04 DIAGNOSIS — Z951 Presence of aortocoronary bypass graft: Secondary | ICD-10-CM | POA: Insufficient documentation

## 2019-12-04 DIAGNOSIS — Z79890 Hormone replacement therapy (postmenopausal): Secondary | ICD-10-CM | POA: Insufficient documentation

## 2019-12-04 DIAGNOSIS — E039 Hypothyroidism, unspecified: Secondary | ICD-10-CM | POA: Insufficient documentation

## 2019-12-04 DIAGNOSIS — Z1211 Encounter for screening for malignant neoplasm of colon: Secondary | ICD-10-CM | POA: Diagnosis not present

## 2019-12-04 DIAGNOSIS — K219 Gastro-esophageal reflux disease without esophagitis: Secondary | ICD-10-CM | POA: Diagnosis not present

## 2019-12-04 DIAGNOSIS — Z87891 Personal history of nicotine dependence: Secondary | ICD-10-CM | POA: Insufficient documentation

## 2019-12-04 DIAGNOSIS — K5790 Diverticulosis of intestine, part unspecified, without perforation or abscess without bleeding: Secondary | ICD-10-CM | POA: Diagnosis not present

## 2019-12-04 DIAGNOSIS — Z79899 Other long term (current) drug therapy: Secondary | ICD-10-CM | POA: Diagnosis not present

## 2019-12-04 DIAGNOSIS — K621 Rectal polyp: Secondary | ICD-10-CM | POA: Diagnosis not present

## 2019-12-04 DIAGNOSIS — E785 Hyperlipidemia, unspecified: Secondary | ICD-10-CM | POA: Insufficient documentation

## 2019-12-04 DIAGNOSIS — K5730 Diverticulosis of large intestine without perforation or abscess without bleeding: Secondary | ICD-10-CM | POA: Insufficient documentation

## 2019-12-04 DIAGNOSIS — Z881 Allergy status to other antibiotic agents status: Secondary | ICD-10-CM | POA: Insufficient documentation

## 2019-12-04 DIAGNOSIS — Z955 Presence of coronary angioplasty implant and graft: Secondary | ICD-10-CM | POA: Insufficient documentation

## 2019-12-04 DIAGNOSIS — Z7982 Long term (current) use of aspirin: Secondary | ICD-10-CM | POA: Insufficient documentation

## 2019-12-04 DIAGNOSIS — I10 Essential (primary) hypertension: Secondary | ICD-10-CM | POA: Insufficient documentation

## 2019-12-04 DIAGNOSIS — K635 Polyp of colon: Secondary | ICD-10-CM | POA: Diagnosis not present

## 2019-12-04 HISTORY — DX: Other complications of anesthesia, initial encounter: T88.59XA

## 2019-12-04 HISTORY — PX: COLONOSCOPY WITH PROPOFOL: SHX5780

## 2019-12-04 SURGERY — COLONOSCOPY WITH PROPOFOL
Anesthesia: General

## 2019-12-04 MED ORDER — EPHEDRINE SULFATE 50 MG/ML IJ SOLN
INTRAMUSCULAR | Status: DC | PRN
  Administered 2019-12-04 (×2): 5 mg via INTRAVENOUS

## 2019-12-04 MED ORDER — GLYCOPYRROLATE 0.2 MG/ML IJ SOLN
INTRAMUSCULAR | Status: DC | PRN
  Administered 2019-12-04: .2 mg via INTRAVENOUS

## 2019-12-04 MED ORDER — PROPOFOL 500 MG/50ML IV EMUL
INTRAVENOUS | Status: AC
  Filled 2019-12-04: qty 50

## 2019-12-04 MED ORDER — PROPOFOL 500 MG/50ML IV EMUL
INTRAVENOUS | Status: DC | PRN
  Administered 2019-12-04: 175 ug/kg/min via INTRAVENOUS

## 2019-12-04 MED ORDER — PROPOFOL 10 MG/ML IV BOLUS
INTRAVENOUS | Status: DC | PRN
  Administered 2019-12-04 (×2): 20 mg via INTRAVENOUS
  Administered 2019-12-04: 60 mg via INTRAVENOUS

## 2019-12-04 MED ORDER — LIDOCAINE HCL (CARDIAC) PF 100 MG/5ML IV SOSY
PREFILLED_SYRINGE | INTRAVENOUS | Status: DC | PRN
  Administered 2019-12-04: 50 mg via INTRAVENOUS

## 2019-12-04 MED ORDER — GLYCOPYRROLATE 0.2 MG/ML IJ SOLN
INTRAMUSCULAR | Status: AC
  Filled 2019-12-04: qty 1

## 2019-12-04 MED ORDER — SODIUM CHLORIDE 0.9 % IV SOLN: INTRAVENOUS | Status: DC

## 2019-12-04 MED ORDER — EPHEDRINE 5 MG/ML INJ
INTRAVENOUS | Status: AC
  Filled 2019-12-04: qty 4

## 2019-12-04 NOTE — Anesthesia Postprocedure Evaluation (Signed)
Anesthesia Post Note  Patient: Penny Reed  Procedure(s) Performed: COLONOSCOPY WITH PROPOFOL (N/A )  Patient location during evaluation: Endoscopy Anesthesia Type: General Level of consciousness: awake and alert and oriented Pain management: pain level controlled Vital Signs Assessment: post-procedure vital signs reviewed and stable Respiratory status: spontaneous breathing, nonlabored ventilation and respiratory function stable Cardiovascular status: blood pressure returned to baseline and stable Postop Assessment: no signs of nausea or vomiting Anesthetic complications: no   No complications documented.   Last Vitals:  Vitals:   12/04/19 0957 12/04/19 1007  BP: (!) 133/44 139/70  Pulse: (!) 56 (!) 58  Resp: 13 12  Temp:    SpO2: 96% 100%    Last Pain:  Vitals:   12/04/19 1007  TempSrc:   PainSc: 0-No pain                 Genelda Roark

## 2019-12-04 NOTE — Op Note (Signed)
[facility] Patient Name: Penny Reed Procedure Date: 12/04/2019 9:02 AM MRN: [id number] Account #: [id number] Date of Birth: Mar 23, 1946 Admit Type: Outpatient Age: 74 Room: [facility] ENDO ROOM 1 Gender: Female Note Status: Finalized Procedure:             Colonoscopy Indications:           Screening for colorectal malignant neoplasm Providers:             Earline Mayotte, MD Referring MD:          Ferdinand Lango. Sullivan Lone, MD (Referring MD) Medicines:             Monitored Anesthesia Care Complications:         No immediate complications. Procedure:             Pre-Anesthesia Assessment:                        - Prior to the procedure, a History and Physical was                         performed, and patient medications, allergies and                         sensitivities were reviewed. The patient's tolerance                         of previous anesthesia was reviewed.                        - The risks and benefits of the procedure and the                         sedation options and risks were discussed with the                         patient. All questions were answered and informed                         consent was obtained.                        After obtaining informed consent, the colonoscope was                         passed under direct vision. Throughout the procedure,                         the patient's blood pressure, pulse, and oxygen                         saturations were monitored continuously. The                         Colonoscope was introduced through the anus and                         advanced to the the cecum, identified by appendiceal                         orifice and  ileocecal valve. The colonoscopy was                         somewhat difficult due to a redundant colon.                         Successful completion of the procedure was aided by                         using manual pressure. The patient tolerated  the                         procedure well. The quality of the bowel preparation                         was excellent. Findings:      A few medium-mouthed diverticula were found in the sigmoid colon.      A 5 mm polyp was found in the rectum. The polyp was sessile. Biopsies       were taken with a cold forceps for histology.      The retroflexed view of the distal rectum and anal verge was normal and       showed no anal or rectal abnormalities. Impression:            - Diverticulosis in the sigmoid colon.                        - One 5 mm polyp in the rectum. Biopsied.                        - The distal rectum and anal verge are normal on                         retroflexion view. Recommendation:        - Telephone endoscopist for pathology results in 1                         week. Procedure Code(s):     --- Professional ---                        [phone], Colonoscopy, flexible; with biopsy, single or                         multiple Diagnosis Code(s):     --- Professional ---                        Z12.11, Encounter for screening for malignant neoplasm                         of colon                        K62.1, Rectal polyp                        K57.30, Diverticulosis of large intestine without                         perforation or abscess without bleeding  CPT copyright 2019 American Medical Association. All rights reserved. The codes documented in this report are preliminary and upon coder review may  be revised to meet current compliance requirements. Earline Mayotte, MD 12/04/2019 9:34:50 AM This report has been signed electronically. Number of Addenda: 0 Note Initiated On: 12/04/2019 9:02 AM Scope Withdrawal Time: 0 hours 11 minutes 4 seconds  Total Procedure Duration: 0 hours 19 minutes 56 seconds  Estimated Blood Loss:  Estimated blood loss: none.      [facility]

## 2019-12-04 NOTE — Transfer of Care (Signed)
Immediate Anesthesia Transfer of Care Note  Patient: Penny Reed  Procedure(s) Performed: COLONOSCOPY WITH PROPOFOL (N/A )  Patient Location: PACU  Anesthesia Type:General  Level of Consciousness: awake and alert   Airway & Oxygen Therapy: Patient Spontanous Breathing  Post-op Assessment: Report given to RN and Post -op Vital signs reviewed and stable  Post vital signs: Reviewed and stable  Last Vitals:  Vitals Value Taken Time  BP 91/50 12/04/19 0937  Temp 36.1 C 12/04/19 0937  Pulse 59 12/04/19 0938  Resp 15 12/04/19 0938  SpO2 98 % 12/04/19 0938  Vitals shown include unvalidated device data.  Last Pain:  Vitals:   12/04/19 0937  TempSrc: Tympanic  PainSc: 0-No pain         Complications: No complications documented.

## 2019-12-04 NOTE — Anesthesia Preprocedure Evaluation (Signed)
Anesthesia Evaluation  Patient identified by MRN, date of birth, ID band Patient awake    Reviewed: Allergy & Precautions, NPO status , Patient's Chart, lab work & pertinent test results  History of Anesthesia Complications History of anesthetic complications: woke up during last colonoscopy.  Airway Mallampati: II  TM Distance: >3 FB Neck ROM: Full    Dental no notable dental hx.    Pulmonary neg sleep apnea, COPD,  COPD inhaler, neg recent URI, former smoker,    breath sounds clear to auscultation- rhonchi (-) wheezing      Cardiovascular hypertension, (-) angina+ CAD, + Cardiac Stents (all stents prior to CABG) and + CABG   Rhythm:Regular Rate:Normal - Systolic murmurs and - Diastolic murmurs    Neuro/Psych neg Seizures negative neurological ROS  negative psych ROS   GI/Hepatic Neg liver ROS, GERD  ,  Endo/Other  neg diabetesHypothyroidism   Renal/GU negative Renal ROS     Musculoskeletal negative musculoskeletal ROS (+)   Abdominal (+) - obese,   Peds  Hematology negative hematology ROS (+)   Anesthesia Other Findings Past Medical History: No date: Allergic rhinitis No date: CAD (coronary artery disease) No date: Complication of anesthesia     Comment:  woke up during colonoscopy No date: Hyperlipidemia No date: Hypertension No date: Hypothyroidism No date: LVH (left ventricular hypertrophy)   Reproductive/Obstetrics                             Anesthesia Physical Anesthesia Plan  ASA: III  Anesthesia Plan: General   Post-op Pain Management:    Induction: Intravenous  PONV Risk Score and Plan: 2 and Propofol infusion  Airway Management Planned: Natural Airway  Additional Equipment:   Intra-op Plan:   Post-operative Plan:   Informed Consent: I have reviewed the patients History and Physical, chart, labs and discussed the procedure including the risks, benefits and  alternatives for the proposed anesthesia with the patient or authorized representative who has indicated his/her understanding and acceptance.     Dental advisory given  Plan Discussed with: CRNA and Anesthesiologist  Anesthesia Plan Comments:         Anesthesia Quick Evaluation

## 2019-12-04 NOTE — H&P (Signed)
Penny Reed [id number] 10/15/1945     HPI:  Healthy 74 y/o female for a screening colonoscopy. Tolerated prep well.   Medications Prior to Admission  Medication Sig Dispense Refill Last Dose  . amLODipine (NORVASC) 5 MG tablet TAKE 1 TABLET DAILY 90 tablet 4 12/04/2019 at 0530  . ANORO ELLIPTA 62.5-25 MCG/INH AEPB INHALE 1 PUFF BY MOUTH EVERY DAY 120 each 3 12/04/2019 at Unknown time  . aspirin 81 MG tablet Take 81 mg by mouth daily.   12/04/2019 at Unknown time  . atorvastatin (LIPITOR) 80 MG tablet Take 1 tablet (80 mg total) by mouth daily. 90 tablet 4 12/04/2019 at Unknown time  . Calcium Carbonate (CALCIUM 600 PO) Take 1 tablet by mouth daily.   12/03/2019 at Unknown time  . cetirizine (ZYRTEC) 10 MG tablet Take 10 mg by mouth daily as needed for allergies.    12/03/2019 at Unknown time  . cholecalciferol (VITAMIN D3) 25 MCG (1000 UNIT) tablet Take 1,000 Units by mouth daily.   12/03/2019 at Unknown time  . furosemide (LASIX) 40 MG tablet Take 40 mg by mouth as needed (for swelling).    12/03/2019 at Unknown time  . levothyroxine (SYNTHROID) 112 MCG tablet TAKE ONE TABLET BY MOUTH SIX DAYS PER WEEK. TAKE 1 AND 1/2 TABLET BY MOUTH ONE DAY PER WEEK. 100 tablet 4 12/03/2019 at 0300  . Multiple Vitamin (MULTIVITAMIN) tablet Take 1 tablet by mouth daily.   12/03/2019 at Unknown time  . Omega-3 Fatty Acids (FISH OIL) 1000 MG CAPS Take 4 capsules by mouth daily.   Past Week at Unknown time  . psyllium (METAMUCIL) 0.52 g capsule Take 0.52 g by mouth daily. Fiber capsules   12/01/2019 at Unknown time  . quinapril (ACCUPRIL) 40 MG tablet Take 1 tablet (40 mg total) by mouth daily. 90 tablet 4 12/04/2019 at Unknown time  . albuterol (PROAIR HFA) 108 (90 BASE) MCG/ACT inhaler Inhale 2 puffs into the lungs every 6 (six) hours as needed for wheezing or shortness of breath. 3 Inhaler 3   . azelastine (ASTELIN) 0.1 % nasal spray SPRAY 2 SPRAYS INTO EACH NOSTRIL EVERY DAY (Patient taking differently: Place 2  sprays into both nostrils daily. SPRAY 2 SPRAYS INTO EACH NOSTRIL EVERY DAY) 30 mL 5   . montelukast (SINGULAIR) 10 MG tablet Take 1 tablet (10 mg total) by mouth at bedtime. (Patient not taking: Reported on 08/28/2017) 90 tablet 2   . nitroGLYCERIN (NITROSTAT) 0.4 MG SL tablet Place 0.4 mg under the tongue every 5 (five) minutes as needed for chest pain.      [name] omeprazole (PRILOSEC) 20 MG capsule Take 1 capsule (20 mg total) by mouth daily. (Patient not taking: Reported on 09/03/2018) 90 capsule 3   . zolpidem (AMBIEN) 5 MG tablet TAKE 1 TABLET (5 MG TOTAL) BY MOUTH AT BEDTIME AS NEEDED FOR SLEEP. 30 tablet 4    Allergies  Allergen Reactions  . Adhesive [Tape]     Band-aids irritate skin  . Azithromycin Diarrhea and Nausea And Vomiting   Past Medical History:  Diagnosis Date  . Allergic rhinitis   . CAD (coronary artery disease)   . Complication of anesthesia    woke up during colonoscopy  . Hyperlipidemia   . Hypertension   . Hypothyroidism   . LVH (left ventricular hypertrophy)    Past Surgical History:  Procedure Laterality Date  . ABDOMINAL HYSTERECTOMY     has left ovary  . APPENDECTOMY    . CARDIAC  CATHETERIZATION  09/2005   had 1 stent and 3 balloons placed  . CORONARY ARTERY BYPASS GRAFT    . TONSILLECTOMY     Social History   Socioeconomic History  . Marital status: Married    Spouse name: Not on file  . Number of children: 3  . Years of education: Not on file  . Highest education level: Associate degree: occupational, Scientist, product/process development, or vocational program  Occupational History  . Not on file  Tobacco Use  . Smoking status: Former Smoker    Packs/day: 0.25    Years: 25.00    Pack years: 6.25    Types: Cigarettes    Quit date: 04/25/2005    Years since quitting: 14.6  . Smokeless tobacco: Never Used  Vaping Use  . Vaping Use: Never used  Substance and Sexual Activity  . Alcohol use: Yes    Alcohol/week: 5.0 standard drinks    Types: 5 Glasses of wine per week     Comment: spread out between 2-3 nights a week  . Drug use: No  . Sexual activity: Not on file  Other Topics Concern  . Not on file  Social History Narrative  . Not on file   Social Determinants of Health   Financial Resource Strain: Low Risk   . Difficulty of Paying Living Expenses: Not hard at all  Food Insecurity: No Food Insecurity  . Worried About Programme researcher, broadcasting/film/video in the Last Year: Never true  . Ran Out of Food in the Last Year: Never true  Transportation Needs: No Transportation Needs  . Lack of Transportation (Medical): No  . Lack of Transportation (Non-Medical): No  Physical Activity: Sufficiently Active  . Days of Exercise per Week: 6 days  . Minutes of Exercise per Session: 60 min  Stress: No Stress Concern Present  . Feeling of Stress : Not at all  Social Connections: Moderately Integrated  . Frequency of Communication with Friends and Family: More than three times a week  . Frequency of Social Gatherings with Friends and Family: Three times a week  . Attends Religious Services: Never  . Active Member of Clubs or Organizations: Yes  . Attends Banker Meetings: More than 4 times per year  . Marital Status: Married  Catering manager Violence: Not At Risk  . Fear of Current or Ex-Partner: No  . Emotionally Abused: No  . Physically Abused: No  . Sexually Abused: No   Social History   Social History Narrative  . Not on file     ROS: Negative.     PE: HEENT: Negative. Lungs: Clear. Cardio: RR.  Assessment/Plan:  Proceed with planned endoscopy.   Merrily [facility] 12/04/2019

## 2019-12-05 ENCOUNTER — Encounter: Payer: Self-pay | Admitting: General Surgery

## 2019-12-05 LAB — SURGICAL PATHOLOGY

## 2019-12-06 ENCOUNTER — Ambulatory Visit: Payer: Medicare HMO | Admitting: Pharmacist

## 2019-12-06 ENCOUNTER — Other Ambulatory Visit: Payer: Self-pay

## 2019-12-06 DIAGNOSIS — J449 Chronic obstructive pulmonary disease, unspecified: Secondary | ICD-10-CM

## 2019-12-06 DIAGNOSIS — E7800 Pure hypercholesterolemia, unspecified: Secondary | ICD-10-CM

## 2019-12-06 NOTE — Chronic Care Management (AMB) (Signed)
Chronic Care Management Pharmacy  Name: Penny Reed  MRN: [id number] DOB: 07-18-1945  Chief Complaint/ HPI  Penny Reed,  74 y.o. , female presents for their Initial CCM visit with the clinical pharmacist via telephone due to COVID-19 Pandemic.  PCP : Jerrol Banana., MD  Their chronic conditions include: HTN, HLD, COPD  Office Visits: 7/21 CAD, Rosanna Randy, BP 139/61 P 48 Wt 180 BMI 28.2. s/p CABG  Consult Visit: 6/29 DOE, Rosanna Randy, BP 120/76 P 49 Wt 177 BMI 27.7, EF 45 - 50%, chronic bradycardia, no O2, carotid bruit neg 3/30 SOB, ED  Medications: Outpatient Encounter Medications as of 12/06/2019  Medication Sig  . albuterol (PROAIR HFA) 108 (90 BASE) MCG/ACT inhaler Inhale 2 puffs into the lungs every 6 (six) hours as needed for wheezing or shortness of breath.  [name] amLODipine (NORVASC) 5 MG tablet TAKE 1 TABLET DAILY  . ANORO ELLIPTA 62.5-25 MCG/INH AEPB INHALE 1 PUFF BY MOUTH EVERY DAY  . aspirin 81 MG tablet Take 81 mg by mouth daily.  [name] atorvastatin (LIPITOR) 80 MG tablet Take 1 tablet (80 mg total) by mouth daily.  [name] azelastine (ASTELIN) 0.1 % nasal spray SPRAY 2 SPRAYS INTO EACH NOSTRIL EVERY DAY (Patient taking differently: Place 2 sprays into both nostrils daily. SPRAY 2 SPRAYS INTO EACH NOSTRIL EVERY DAY)  . Calcium Carbonate (CALCIUM 600 PO) Take 1 tablet by mouth 2 (two) times a week.   . cholecalciferol (VITAMIN D3) 25 MCG (1000 UNIT) tablet Take 5,000 Units by mouth daily.   . furosemide (LASIX) 40 MG tablet Take 20 mg by mouth as needed (for swelling).   [name] levothyroxine (SYNTHROID) 112 MCG tablet TAKE ONE TABLET BY MOUTH SIX DAYS PER WEEK. TAKE 1 AND 1/2 TABLET BY MOUTH ONE DAY PER WEEK.  [name] loratadine (CLARITIN) 10 MG tablet Take 10 mg by mouth daily.  . Multiple Vitamin (MULTIVITAMIN) tablet Take 1 tablet by mouth daily. Standard Process  . nitroGLYCERIN (NITROSTAT) 0.4 MG SL tablet Place 0.4 mg under the tongue every 5 (five) minutes as needed for  chest pain.   . Omega-3 Fatty Acids (FISH OIL) 1000 MG CAPS Take 3 capsules by mouth daily.   . psyllium (METAMUCIL) 0.52 g capsule Take 2 g by mouth daily. Fiber capsules  . quinapril (ACCUPRIL) 40 MG tablet Take 1 tablet (40 mg total) by mouth daily.  [name] zolpidem (AMBIEN) 5 MG tablet TAKE 1 TABLET (5 MG TOTAL) BY MOUTH AT BEDTIME AS NEEDED FOR SLEEP.  [name] cetirizine (ZYRTEC) 10 MG tablet Take 10 mg by mouth daily as needed for allergies.  (Patient not taking: Reported on 12/06/2019)  . montelukast (SINGULAIR) 10 MG tablet Take 1 tablet (10 mg total) by mouth at bedtime. (Patient not taking: Reported on 08/28/2017)  . omeprazole (PRILOSEC) 20 MG capsule Take 1 capsule (20 mg total) by mouth daily. (Patient not taking: Reported on 09/03/2018)   No facility-administered encounter medications on file as of 12/06/2019.      Financial Resource Strain: Low Risk   . Difficulty of Paying Living Expenses: Not hard at all    Current Diagnosis/Assessment:   Goals Addressed            This Visit's Progress   . Chronic Care Management       CARE PLAN ENTRY (see longitudinal plan of care for additional care plan information)  Current Barriers:  . Chronic Disease Management support, education, and care coordination needs related to Hypertension, Hyperlipidemia, and COPD  Hypertension BP Readings from Last 3 Encounters:  12/04/19 139/70  11/13/19 139/61  10/21/19 124/60   . Pharmacist Clinical Goal(s): o Over the next 90 days, patient will work with PharmD and providers to maintain BP goal <140/90 . Current regimen:  o Amlodipine 51m daily o Quinapril 443mdaily . Interventions: o None . Patient self care activities - Over the next 90 days, patient will: o Check BP weekly, document, and provide at future appointments o Ensure daily salt intake < 2300 mg/day  Hyperlipidemia Lab Results  Component Value Date/Time   LDLCALC 80 09/05/2018 11:49 AM   . Pharmacist Clinical Goal(s): o Over  the next 90 days, patient will work with PharmD and providers to maintain LDL goal < 100 . Current regimen:  o Lipitor 8041maily . Interventions: o None . Patient self care activities - Over the next 90 days, patient will: o Continuing low fat diet and regular exercise  COPD . Pharmacist Clinical Goal(s) o Over the next 90 days, patient will work with PharmD and providers to improve breathing . Current regimen:  o Anoro Ellipta 62.5/25mcg 1 puff daily o ProAir HFA 2 puffs every 6 hours as needed for wheezing/shortness of breath . Interventions: o None . Patient self care activities - Over the next 90 days, patient will: o Report to provider or PharmD if rescue inhaler usage increases  Medication management . Pharmacist Clinical Goal(s): o Over the next 90 days, patient will work with PharmD and providers to maintain optimal medication adherence . [facility] . Interventions o Comprehensive medication review performed. o Utilize [facility] pharmacy for medication synchronization, packaging and delivery . Patient self care activities - Over the next 90 days, patient will: o Focus on medication adherence by continuing current practices o Take medications as prescribed o Report any questions or concerns to PharmD and/or provider(s)  Initial goal documentation       Hypertension   BP goal is:  <140/90  Office blood pressures are  BP Readings from Last 3 Encounters:  12/04/19 139/70  11/13/19 139/61  10/21/19 124/60   Patient checks BP at home daily Patient home BP readings are ranging: 125/64  Patient has failed these meds in the past: NA Patient is currently controlled on the following medications:  . Norvasc 5mg28mily . Quinapril 40mg49mly  We discussed: At goal Denies hypotension Takes as directed  Plan  Continue current medications   Hyperlipidemia   LDL goal < 100  Lipid Panel     Component Value Date/Time   CHOL 184 09/05/2018 1149    TRIG 77 09/05/2018 1149   HDL 89 09/05/2018 1149   LDLCALC 80 09/05/2018 1149    Hepatic Function Latest Ref Rng & Units 09/05/2018 08/28/2017 07/12/2017  Total Protein 6.0 - 8.5 g/dL 7.1 7.1 7.1  Albumin 3.7 - 4.7 g/dL 4.5 4.5 4.2  AST 0 - 40 IU/L 38 27 31  ALT 0 - 32 IU/L - 20 24  Alk Phosphatase 39 - 117 IU/L 77 90 95  Total Bilirubin 0.0 - 1.2 mg/dL 1.0 0.6 0.8     The ASCVD Risk score (GoffMikey Bussingr., et al., 2013) failed to calculate for the following reasons:   The patient has a prior MI or stroke diagnosis   Patient has failed these meds in past: NA Patient is currently controlled on the following medications:  . Lipitor 80mg 28my  We discussed:  At goal Denies myalgias  Plan  Continue current medications and  control with diet and exercise  COPD / Asthma / Tobacco   Eos (Absolute):  Lab Results  Component Value Date/Time   EOSABS 0.1 09/05/2018 11:49 AM    Tobacco Status:  Social History   Tobacco Use  Smoking Status Former Smoker  . Packs/day: 0.25  . Years: 25.00  . Pack years: 6.25  . Types: Cigarettes  . Quit date: 04/25/2005  . Years since quitting: 14.6  Smokeless Tobacco Never Used    Patient has failed these meds in past: Singulair Patient is currently controlled on the following medications: ProAir HFA, Anoro Ellipta 62.5-25mcg Using maintenance inhaler regularly? Yes Frequency of rescue inhaler use:  infrequently  We discussed:   ProAir last used a month ago Pre-med for personal trainer workout, excellent usage  Plan  Continue current medications  Medication Management   [facility] Uses pill box? Yes Pt endorses 100% compliance  [facility]. [facility]es [facility] daughter. Doesn't want delivery.   Plan  Continue current medication management strategy  Follow up: 3 month phone visit  Milus Height, PharmD, [place], [place] [phone]

## 2019-12-11 NOTE — Patient Instructions (Addendum)
Visit Information  Goals Addressed            This Visit's Progress   . Chronic Care Management       CARE PLAN ENTRY (see longitudinal plan of care for additional care plan information)  Current Barriers:  . Chronic Disease Management support, education, and care coordination needs related to Hypertension, Hyperlipidemia, and COPD   Hypertension BP Readings from Last 3 Encounters:  12/04/19 139/70  11/13/19 139/61  10/21/19 124/60   . Pharmacist Clinical Goal(s): o Over the next 90 days, patient will work with PharmD and providers to maintain BP goal <140/90 . Current regimen:  o Amlodipine 5mg  daily o Quinapril 40mg  daily . Interventions: o None . Patient self care activities - Over the next 90 days, patient will: o Check BP weekly, document, and provide at future appointments o Ensure daily salt intake < 2300 mg/day  Hyperlipidemia Lab Results  Component Value Date/Time   LDLCALC 80 09/05/2018 11:49 AM   . Pharmacist Clinical Goal(s): o Over the next 90 days, patient will work with PharmD and providers to maintain LDL goal < 100 . Current regimen:  o Lipitor 80mg  daily . Interventions: o None . Patient self care activities - Over the next 90 days, patient will: o Continuing low fat diet and regular exercise  COPD . Pharmacist Clinical Goal(s) o Over the next 90 days, patient will work with PharmD and providers to improve breathing . Current regimen:  o Anoro Ellipta 62.5/25mcg 1 puff daily o ProAir HFA 2 puffs every 6 hours as needed for wheezing/shortness of breath . Interventions: o None . Patient self care activities - Over the next 90 days, patient will: o Report to provider or PharmD if rescue inhaler usage increases  Medication management . Pharmacist Clinical Goal(s): o Over the next 90 days, patient will work with PharmD and providers to maintain optimal medication adherence . [facility] . Interventions o Comprehensive medication  review performed. o Utilize [facility] pharmacy for medication synchronization, packaging and delivery . Patient self care activities - Over the next 90 days, patient will: o Focus on medication adherence by continuing current practices o Take medications as prescribed o Report any questions or concerns to PharmD and/or provider(s)  Initial goal documentation        Ms. Durkin was given information about Chronic Care Management services today including:  1. CCM service includes personalized support from designated clinical staff supervised by her physician, including individualized plan of care and coordination with other care providers 2. 24/7 contact phone numbers for assistance for urgent and routine care needs. 3. Standard insurance, coinsurance, copays and deductibles apply for chronic care management only during months in which we provide at least 20 minutes of these services. Most insurances cover these services at 100%, however patients may be responsible for any copay, coinsurance and/or deductible if applicable. This service may help you avoid the need for more expensive face-to-face services. 4. Only one practitioner may furnish and bill the service in a calendar month. 5. The patient may stop CCM services at any time (effective at the end of the month) by phone call to the office staff.  Patient agreed to services and verbal consent obtained.   Print copy of patient instructions provided.  Telephone follow up appointment with pharmacy team member scheduled for:  , PharmD, BCGP, CTTS Clinical Pharmacist [facility] [phone]   COPD and Physical Activity Chronic obstructive pulmonary disease (COPD) is a long-term (chronic) condition  that affects the lungs. COPD is a general term that can be used to describe many different lung problems that cause lung swelling (inflammation) and limit airflow, including chronic bronchitis and emphysema. The main  symptom of COPD is shortness of breath, which makes it harder to do even simple tasks. This can also make it harder to exercise and be active. Talk with your health care provider about treatments to help you breathe better and actions you can take to prevent breathing problems during physical activity. What are the benefits of exercising with COPD? Exercising regularly is an important part of a healthy lifestyle. You can still exercise and do physical activities even though you have COPD. Exercise and physical activity improve your shortness of breath by increasing blood flow (circulation). This causes your heart to pump more oxygen through your body. Moderate exercise can improve your:  Oxygen use.  Energy level.  Shortness of breath.  Strength in your breathing muscles.  Heart health.  Sleep.  Self-esteem and feelings of self-worth.  Depression, stress, and anxiety levels. Exercise can benefit everyone with COPD. The severity of your disease may affect how hard you can exercise, especially at first, but everyone can benefit. Talk with your health care provider about how much exercise is safe for you, and which activities and exercises are safe for you. What actions can I take to prevent breathing problems during physical activity?  Sign up for a pulmonary rehabilitation program. This type of program may include: ? Education about lung diseases. ? Exercise classes that teach you how to exercise and be more active while improving your breathing. This usually involves:  Exercise using your lower extremities, such as a stationary bicycle.  About 30 minutes of exercise, 2 to 5 times per week, for 6 to 12 weeks  Strength training, such as push ups or leg lifts. ? Nutrition education. ? Group classes in which you can talk with others who also have COPD and learn ways to manage stress.  If you use an oxygen tank, you should use it while you exercise. Work with your health care provider to  adjust your oxygen for your physical activity. Your resting flow rate is different from your flow rate during physical activity.  While you are exercising: ? Take slow breaths. ? Pace yourself and do not try to go too fast. ? Purse your lips while breathing out. Pursing your lips is similar to a kissing or whistling position. ? If doing exercise that uses a quick burst of effort, such as weight lifting:  Breathe in before starting the exercise.  Breathe out during the hardest part of the exercise (such as raising the weights). Where to find support You can find support for exercising with COPD from:  Your health care provider.  A pulmonary rehabilitation program.  Your local health department or community health programs.  Support groups, online or in-person. Your health care provider may be able to recommend support groups. Where to find more information You can find more information about exercising with COPD from:  American Lung Association: [place].hu.  COPD Foundation: AlmostHot.gl. Contact a health care provider if:  Your symptoms get worse.  You have chest pain.  You have nausea.  You have a fever.  You have trouble talking or catching your breath.  You want to start a new exercise program or a new activity. Summary  COPD is a general term that can be used to describe many different lung problems that cause lung swelling (inflammation)  and limit airflow. This includes chronic bronchitis and emphysema.  Exercise and physical activity improve your shortness of breath by increasing blood flow (circulation). This causes your heart to provide more oxygen to your body.  Contact your health care provider before starting any exercise program or new activity. Ask your health care provider what exercises and activities are safe for you. This information is not intended to replace advice given to you by your health care provider. Make sure you discuss any questions you  have with your health care provider. Document Revised: 08/01/2018 Document Reviewed: 05/04/2017 Elsevier Patient Education  2020 [employer].

## 2020-01-08 DIAGNOSIS — M5382 Other specified dorsopathies, cervical region: Secondary | ICD-10-CM | POA: Diagnosis not present

## 2020-01-08 DIAGNOSIS — M9901 Segmental and somatic dysfunction of cervical region: Secondary | ICD-10-CM | POA: Diagnosis not present

## 2020-01-08 DIAGNOSIS — M9902 Segmental and somatic dysfunction of thoracic region: Secondary | ICD-10-CM | POA: Diagnosis not present

## 2020-01-09 DIAGNOSIS — M9902 Segmental and somatic dysfunction of thoracic region: Secondary | ICD-10-CM | POA: Diagnosis not present

## 2020-01-09 DIAGNOSIS — M5382 Other specified dorsopathies, cervical region: Secondary | ICD-10-CM | POA: Diagnosis not present

## 2020-01-09 DIAGNOSIS — M9901 Segmental and somatic dysfunction of cervical region: Secondary | ICD-10-CM | POA: Diagnosis not present

## 2020-01-17 DIAGNOSIS — R69 Illness, unspecified: Secondary | ICD-10-CM | POA: Diagnosis not present

## 2020-02-26 ENCOUNTER — Telehealth: Payer: Self-pay | Admitting: *Deleted

## 2020-02-26 NOTE — Chronic Care Management (AMB) (Signed)
  Care Management   Note  02/26/2020 Name: Penny Reed MRN: [id number] DOB: 1945-09-21  Penny Reed is a 74 y.o. year old female who is a primary care patient of Maple Hudson., MD and is actively engaged with the care management team. I reached out to Toma Aran by phone today to assist with cancel follow up call with PharmD. Advised patient to call into office if any needs come up will call to reschedule follow call with PharmD.   Gwenevere Ghazi  Care Guide, Embedded Care Coordination [facility] Management  Direct Dial: [phone]

## 2020-03-09 ENCOUNTER — Telehealth: Payer: Self-pay

## 2020-03-10 ENCOUNTER — Other Ambulatory Visit: Payer: Self-pay | Admitting: Family Medicine

## 2020-03-10 DIAGNOSIS — R69 Illness, unspecified: Secondary | ICD-10-CM | POA: Diagnosis not present

## 2020-03-10 NOTE — Telephone Encounter (Signed)
Requested medication (s) are due for refill today: yes  Requested medication (s) are on the active medication list: yes  Last refill:  06/20/19 #30 4 refills  Future visit scheduled: yes  Notes to clinic:  not delegated per protocol     Requested Prescriptions  Pending Prescriptions Disp Refills   zolpidem (AMBIEN) 5 MG tablet [Pharmacy Med Name: ZOLPIDEM TARTRATE 5 MG TABLET] 30 tablet     Sig: TAKE 1 TABLET (5 MG TOTAL) BY MOUTH AT BEDTIME AS NEEDED FOR SLEEP.      Not Delegated - Psychiatry:  Anxiolytics/Hypnotics Failed - 03/10/2020 12:36 PM      Failed - This refill cannot be delegated      Failed - Urine Drug Screen completed in last 360 days      Passed - Valid encounter within last 6 months    Recent Outpatient Visits           3 months ago Arteriosclerosis of coronary artery   [facility] Maple Hudson., MD   4 months ago Perforation of right tympanic membrane   [facility] Chrism[place], Jodell [place], [place]   6 months ago Annual physical exam   [facility] Maple Hudson., MD   1 year ago Acute cystitis without hematuria   [facility] Maple Hudson., MD   1 year ago Encounter for annual physical exam   [facility] Maple Hudson., MD       Future Appointments             In 6 months Maple Hudson., MD [facility]

## 2020-03-31 NOTE — Chronic Care Management (AMB) (Signed)
  Care Management   Note  03/31/2020 Name: Penny Reed MRN: [id number] DOB: 17-Apr-1946  Penny Reed is a 74 y.o. year old female who is a primary care patient of Maple Hudson., MD and is actively engaged with the care management team. I reached out to Toma Aran by phone today to assist with re-scheduling a follow up visit with the Pharmacist.  Follow up plan: Telephone appointment with care management team member scheduled for:04/27/2020  [facility] Guide, Embedded Care Coordination [facility] Management

## 2020-04-27 ENCOUNTER — Ambulatory Visit: Payer: Medicare HMO

## 2020-04-27 DIAGNOSIS — I10 Essential (primary) hypertension: Secondary | ICD-10-CM

## 2020-04-27 DIAGNOSIS — J449 Chronic obstructive pulmonary disease, unspecified: Secondary | ICD-10-CM

## 2020-04-27 NOTE — Chronic Care Management (AMB) (Signed)
Chronic Care Management Pharmacy  Name: Penny Reed  MRN: [id number] DOB: 18-Jun-1945  Chief Complaint/ HPI  Penny Reed,  75 y.o. , female presents for their Follow-Up CCM visit with the clinical pharmacist via telephone.  PCP : Jerrol Banana., MD  Their chronic conditions include: Hypertension, Hyperlipidemia, Coronary Artery Disease, GERD, COPD, Hypothyroidism, Allergic Rhinitis, and Insomnia  Office Visits: 7/21 CAD, Rosanna Randy, BP 139/61 P 48 Wt 180 BMI 28.2. s/p CABG  Consult Visit: 6/29 DOE, Rosanna Randy, BP 120/76 P 49 Wt 177 BMI 27.7, EF 45 - 50%, chronic bradycardia, no O2, carotid bruit neg 3/30 SOB, ED  Medications: Outpatient Encounter Medications as of 04/27/2020  Medication Sig  . albuterol (PROAIR HFA) 108 (90 BASE) MCG/ACT inhaler Inhale 2 puffs into the lungs every 6 (six) hours as needed for wheezing or shortness of breath.  [name] amLODipine (NORVASC) 5 MG tablet TAKE 1 TABLET DAILY  . ANORO ELLIPTA 62.5-25 MCG/INH AEPB INHALE 1 PUFF BY MOUTH EVERY DAY  . aspirin 81 MG tablet Take 81 mg by mouth daily.  [name] atorvastatin (LIPITOR) 80 MG tablet Take 1 tablet (80 mg total) by mouth daily.  [name] azelastine (ASTELIN) 0.1 % nasal spray SPRAY 2 SPRAYS INTO EACH NOSTRIL EVERY DAY (Patient taking differently: Place 2 sprays into both nostrils daily. SPRAY 2 SPRAYS INTO EACH NOSTRIL EVERY DAY)  . Calcium Carbonate (CALCIUM 600 PO) Take 1 tablet by mouth 2 (two) times a week.   . cetirizine (ZYRTEC) 10 MG tablet Take 10 mg by mouth daily as needed for allergies.  (Patient not taking: Reported on 12/06/2019)  . cholecalciferol (VITAMIN D3) 25 MCG (1000 UNIT) tablet Take 5,000 Units by mouth daily.   . furosemide (LASIX) 40 MG tablet Take 20 mg by mouth as needed (for swelling).   [name] levothyroxine (SYNTHROID) 112 MCG tablet TAKE ONE TABLET BY MOUTH SIX DAYS PER WEEK. TAKE 1 AND 1/2 TABLET BY MOUTH ONE DAY PER WEEK.  [name] loratadine (CLARITIN) 10 MG tablet Take 10 mg by mouth daily.   . montelukast (SINGULAIR) 10 MG tablet Take 1 tablet (10 mg total) by mouth at bedtime. (Patient not taking: Reported on 08/28/2017)  . Multiple Vitamin (MULTIVITAMIN) tablet Take 1 tablet by mouth daily. Standard Process  . nitroGLYCERIN (NITROSTAT) 0.4 MG SL tablet Place 0.4 mg under the tongue every 5 (five) minutes as needed for chest pain.   . Omega-3 Fatty Acids (FISH OIL) 1000 MG CAPS Take 3 capsules by mouth daily.   [name] omeprazole (PRILOSEC) 20 MG capsule Take 1 capsule (20 mg total) by mouth daily. (Patient not taking: Reported on 09/03/2018)  . psyllium (METAMUCIL) 0.52 g capsule Take 2 g by mouth daily. Fiber capsules  . quinapril (ACCUPRIL) 40 MG tablet Take 1 tablet (40 mg total) by mouth daily.  [name] zolpidem (AMBIEN) 5 MG tablet TAKE 1 TABLET (5 MG TOTAL) BY MOUTH AT BEDTIME AS NEEDED FOR SLEEP.   No facility-administered encounter medications on file as of 04/27/2020.   Current Diagnosis/Assessment:   SDOH Interventions   Flowsheet Row Most Recent Value  SDOH Interventions   Financial Strain Interventions Intervention Not Indicated  Transportation Interventions Intervention Not Indicated     Goals Addressed            This Visit's Progress   . Chronic Care Management       CARE PLAN ENTRY (see longitudinal plan of care for additional care plan information)  Current Barriers:  . Chronic Disease Management  support, education, and care coordination needs related to Hypertension, Hyperlipidemia, Coronary Artery Disease, GERD, COPD, Hypothyroidism, Allergic Rhinitis, and Insomnia   Hypertension BP Readings from Last 3 Encounters:  12/04/19 139/70  11/13/19 139/61  10/21/19 124/60   . Pharmacist Clinical Goal(s): o Over the next 90 days, patient will work with PharmD and providers to maintain BP goal <140/90 . Current regimen:  o Amlodipine 50m daily o Quinapril 428mdaily . Interventions: o None . Patient self care activities - Over the next 90 days, patient  will: o Check BP weekly, document, and provide at future appointments o Ensure daily salt intake < 2300 mg/day  Hyperlipidemia Lab Results  Component Value Date/Time   LDLCALC 80 09/05/2018 11:49 AM   . Pharmacist Clinical Goal(s): o Over the next 90 days, patient will work with PharmD and providers to maintain LDL goal < 100 . Current regimen:  o Atorvastatin 8094maily . Interventions: o None . Patient self care activities - Over the next 90 days, patient will: o Continuing low fat diet and regular exercise  COPD . Pharmacist Clinical Goal(s) o Over the next 90 days, patient will work with PharmD and providers to improve breathing . Current regimen:  o Anoro Ellipta 62.5/25mcg 1 puff daily o ProAir HFA 2 puffs every 6 hours as needed for wheezing/shortness of breath . Interventions: o None . Patient self care activities - Over the next 90 days, patient will: o Report to provider or PharmD if rescue inhaler usage increases  Medication management . Pharmacist Clinical Goal(s): o Over the next 90 days, patient will work with PharmD and providers to maintain optimal medication adherence . Current [facility]: [facility] . Interventions o Comprehensive medication review performed. o Continue current medication management strategy . Patient self care activities - Over the next 90 days, patient will: o Focus on medication adherence by continuing current practices o Take medications as prescribed o Report any questions or concerns to PharmD and/or provider(s)      Hypertension   BP goal is:  <140/90  Office blood pressures are  BP Readings from Last 3 Encounters:  12/04/19 139/70  11/13/19 139/61  10/21/19 124/60   Patient checks BP at home several times per month Patient home BP readings are ranging: 120/60  Patient has failed these meds in the past: NA Patient is currently controlled on the following medications:  . Amlodipine 5mg3mily . Furosemide 40 mg 1/2 tablet daily  PRN   . Quinapril 40mg40mly  We discussed: Patient reports her blood pressure has been stable and denies any side effects or symptoms of therapy. She does report that she has been having episodes where her heart felt out of rhythm and was racing. It happens intermittently, but happed twice in the past 2 weeks, last about an hour. She checked her blood pressure at these times and it felt normal. She has a follow-up with cardiology later this month to evaluate it.   Plan  Continue current medications   Hyperlipidemia   LDL goal < 100  Lipid Panel     Component Value Date/Time   CHOL 184 09/05/2018 1149   TRIG 77 09/05/2018 1149   HDL 89 09/05/2018 1149   LDLCALC 80 09/05/2018 1149    Hepatic Function Latest Ref Rng & Units 09/05/2018 08/28/2017 07/12/2017  Total Protein 6.0 - 8.5 g/dL 7.1 7.1 7.1  Albumin 3.7 - 4.7 g/dL 4.5 4.5 4.2  AST 0 - 40 IU/L 38 27 31  ALT 0 -  32 IU/L - 20 24  Alk Phosphatase 39 - 117 IU/L 77 90 95  Total Bilirubin 0.0 - 1.2 mg/dL 1.0 0.6 0.8     The ASCVD Risk score ([place]., et al., 2013) failed to calculate for the following reasons:   The patient has a prior MI or stroke diagnosis   Patient has failed these meds in past: NA Patient is currently controlled on the following medications:  . Aspirin 81 mg daily  . Atorvastatin 56m daily  We discussed:  At goal Denies myalgias  Plan  Continue current medications and control with diet and exercise  COPD / Tobacco   Eos (Absolute):  Lab Results  Component Value Date/Time   EOSABS 0.1 09/05/2018 11:49 AM    Tobacco Status:  Social History   Tobacco Use  Smoking Status Former Smoker  . Packs/day: 0.25  . Years: 25.00  . Pack years: 6.25  . Types: Cigarettes  . Quit date: 04/25/2005  . Years since quitting: 15.0  Smokeless Tobacco Never Used    Patient has failed these meds in past: Singulair Patient is currently controlled on the following medications:  . ProAir HFA . Anoro  Ellipta 62.5-25mcg . Montelukast 10 mg QHS   . Azelastine 0.1% nasal spray. 2 spray each nostril daily  . Cetirizine 10 mg daily PRN   ,  Using maintenance inhaler regularly? Yes Frequency of rescue inhaler use:  infrequently  We discussed:   ProAir last used a month ago  Plan  Continue current medications  Hypothyroidism   Lab Results  Component Value Date/Time   TSH 5.740 (H) 09/05/2018 11:49 AM   TSH 0.027 (L) 08/28/2017 12:23 PM    Patient has failed these meds in past: NA Patient is currently controlled on the following medications:  . Levothyroxine 112 mcg 1 tablet 6 days weekly, 1.5 tablets once weekly  We discussed:  Stable; denies symptoms  Plan  Continue current medications  Misc / OTC    . Vitamin D3 5000 units daily  . Calcium carbonate 600 mg twice weekly . Multivitamin daily  . Nitroglycerin 0.4 mg SL  . Fish Oil 1000 mg 3 caps daily  . Omeprazole 20 mg daily  . Psyllium 2 g daily  . Zolpidem 5 mg QHS PRN   Plan  Continue current medications   Medication Management   [facility] Uses pill box? Yes Pt endorses 100% compliance   Plan  Continue current medication management strategy  Follow up: 3 month phone visit  [phone]

## 2020-04-27 NOTE — Patient Instructions (Signed)
Visit Information It was great speaking with you today!  Please let me know if you have any questions about our visit. Goals Addressed            This Visit's Progress    Chronic Care Management       CARE PLAN ENTRY (see longitudinal plan of care for additional care plan information)  Current Barriers:   Chronic Disease Management support, education, and care coordination needs related to Hypertension, Hyperlipidemia, Coronary Artery Disease, GERD, COPD, Hypothyroidism, Allergic Rhinitis, and Insomnia   Hypertension BP Readings from Last 3 Encounters:  12/04/19 139/70  11/13/19 139/61  10/21/19 124/60    Pharmacist Clinical Goal(s): o Over the next 90 days, patient will work with PharmD and providers to maintain BP goal <140/90  Current regimen:  o Amlodipine 5mg  daily o Quinapril 40mg  daily  Interventions: o None  Patient self care activities - Over the next 90 days, patient will: o Check BP weekly, document, and provide at future appointments o Ensure daily salt intake < 2300 mg/day  Hyperlipidemia Lab Results  Component Value Date/Time   LDLCALC 80 09/05/2018 11:49 AM    Pharmacist Clinical Goal(s): o Over the next 90 days, patient will work with PharmD and providers to maintain LDL goal < 100  Current regimen:  o Atorvastatin 80mg  daily  Interventions: o None  Patient self care activities - Over the next 90 days, patient will: o Continuing low fat diet and regular exercise  COPD  Pharmacist Clinical Goal(s) o Over the next 90 days, patient will work with PharmD and providers to improve breathing  Current regimen:  o Anoro Ellipta 62.5/25mcg 1 puff daily o ProAir HFA 2 puffs every 6 hours as needed for wheezing/shortness of breath  Interventions: o None  Patient self care activities - Over the next 90 days, patient will: o Report to provider or PharmD if rescue inhaler usage increases  Medication management  Pharmacist Clinical  Goal(s): o Over the next 90 days, patient will work with PharmD and providers to maintain optimal medication adherence  [facility]  Interventions o Comprehensive medication review performed. o Continue current medication management strategy  Patient self care activities - Over the next 90 days, patient will: o Focus on medication adherence by continuing current practices o Take medications as prescribed o Report any questions or concerns to PharmD and/or provider(s)       The patient verbalized understanding of instructions, educational materials, and care plan provided today and declined offer to receive copy of patient instructions, educational materials, and care plan.   Telephone follow up appointment with pharmacy team member scheduled for: 10/28/19 at 1:00 PM  Clinical Pharmacist [facility] [phone]

## 2020-05-08 DIAGNOSIS — M9902 Segmental and somatic dysfunction of thoracic region: Secondary | ICD-10-CM | POA: Diagnosis not present

## 2020-05-08 DIAGNOSIS — M542 Cervicalgia: Secondary | ICD-10-CM | POA: Diagnosis not present

## 2020-05-08 DIAGNOSIS — M9901 Segmental and somatic dysfunction of cervical region: Secondary | ICD-10-CM | POA: Diagnosis not present

## 2020-05-08 DIAGNOSIS — M5414 Radiculopathy, thoracic region: Secondary | ICD-10-CM | POA: Diagnosis not present

## 2020-05-13 DIAGNOSIS — M542 Cervicalgia: Secondary | ICD-10-CM | POA: Diagnosis not present

## 2020-05-13 DIAGNOSIS — M5414 Radiculopathy, thoracic region: Secondary | ICD-10-CM | POA: Diagnosis not present

## 2020-05-13 DIAGNOSIS — M9902 Segmental and somatic dysfunction of thoracic region: Secondary | ICD-10-CM | POA: Diagnosis not present

## 2020-05-13 DIAGNOSIS — M9901 Segmental and somatic dysfunction of cervical region: Secondary | ICD-10-CM | POA: Diagnosis not present

## 2020-05-14 DIAGNOSIS — M9902 Segmental and somatic dysfunction of thoracic region: Secondary | ICD-10-CM | POA: Diagnosis not present

## 2020-05-14 DIAGNOSIS — M9901 Segmental and somatic dysfunction of cervical region: Secondary | ICD-10-CM | POA: Diagnosis not present

## 2020-05-14 DIAGNOSIS — M542 Cervicalgia: Secondary | ICD-10-CM | POA: Diagnosis not present

## 2020-05-14 DIAGNOSIS — M5414 Radiculopathy, thoracic region: Secondary | ICD-10-CM | POA: Diagnosis not present

## 2020-05-15 DIAGNOSIS — M9901 Segmental and somatic dysfunction of cervical region: Secondary | ICD-10-CM | POA: Diagnosis not present

## 2020-05-15 DIAGNOSIS — M542 Cervicalgia: Secondary | ICD-10-CM | POA: Diagnosis not present

## 2020-05-15 DIAGNOSIS — M5414 Radiculopathy, thoracic region: Secondary | ICD-10-CM | POA: Diagnosis not present

## 2020-05-15 DIAGNOSIS — M9902 Segmental and somatic dysfunction of thoracic region: Secondary | ICD-10-CM | POA: Diagnosis not present

## 2020-05-18 DIAGNOSIS — M9902 Segmental and somatic dysfunction of thoracic region: Secondary | ICD-10-CM | POA: Diagnosis not present

## 2020-05-18 DIAGNOSIS — M5414 Radiculopathy, thoracic region: Secondary | ICD-10-CM | POA: Diagnosis not present

## 2020-05-18 DIAGNOSIS — M9901 Segmental and somatic dysfunction of cervical region: Secondary | ICD-10-CM | POA: Diagnosis not present

## 2020-05-18 DIAGNOSIS — M542 Cervicalgia: Secondary | ICD-10-CM | POA: Diagnosis not present

## 2020-05-20 DIAGNOSIS — M542 Cervicalgia: Secondary | ICD-10-CM | POA: Diagnosis not present

## 2020-05-20 DIAGNOSIS — R55 Syncope and collapse: Secondary | ICD-10-CM | POA: Diagnosis not present

## 2020-05-20 DIAGNOSIS — M5414 Radiculopathy, thoracic region: Secondary | ICD-10-CM | POA: Diagnosis not present

## 2020-05-20 DIAGNOSIS — R000 Tachycardia, unspecified: Secondary | ICD-10-CM | POA: Diagnosis not present

## 2020-05-20 DIAGNOSIS — I208 Other forms of angina pectoris: Secondary | ICD-10-CM | POA: Diagnosis not present

## 2020-05-20 DIAGNOSIS — R0600 Dyspnea, unspecified: Secondary | ICD-10-CM | POA: Diagnosis not present

## 2020-05-20 DIAGNOSIS — I2510 Atherosclerotic heart disease of native coronary artery without angina pectoris: Secondary | ICD-10-CM | POA: Diagnosis not present

## 2020-05-20 DIAGNOSIS — E782 Mixed hyperlipidemia: Secondary | ICD-10-CM | POA: Diagnosis not present

## 2020-05-20 DIAGNOSIS — R002 Palpitations: Secondary | ICD-10-CM | POA: Diagnosis not present

## 2020-05-20 DIAGNOSIS — M9902 Segmental and somatic dysfunction of thoracic region: Secondary | ICD-10-CM | POA: Diagnosis not present

## 2020-05-20 DIAGNOSIS — J449 Chronic obstructive pulmonary disease, unspecified: Secondary | ICD-10-CM | POA: Diagnosis not present

## 2020-05-20 DIAGNOSIS — M9901 Segmental and somatic dysfunction of cervical region: Secondary | ICD-10-CM | POA: Diagnosis not present

## 2020-05-20 DIAGNOSIS — I10 Essential (primary) hypertension: Secondary | ICD-10-CM | POA: Diagnosis not present

## 2020-05-20 DIAGNOSIS — Z951 Presence of aortocoronary bypass graft: Secondary | ICD-10-CM | POA: Diagnosis not present

## 2020-05-22 DIAGNOSIS — M9901 Segmental and somatic dysfunction of cervical region: Secondary | ICD-10-CM | POA: Diagnosis not present

## 2020-05-22 DIAGNOSIS — M9902 Segmental and somatic dysfunction of thoracic region: Secondary | ICD-10-CM | POA: Diagnosis not present

## 2020-05-22 DIAGNOSIS — M5414 Radiculopathy, thoracic region: Secondary | ICD-10-CM | POA: Diagnosis not present

## 2020-05-22 DIAGNOSIS — M542 Cervicalgia: Secondary | ICD-10-CM | POA: Diagnosis not present

## 2020-05-25 DIAGNOSIS — M5414 Radiculopathy, thoracic region: Secondary | ICD-10-CM | POA: Diagnosis not present

## 2020-05-25 DIAGNOSIS — M9901 Segmental and somatic dysfunction of cervical region: Secondary | ICD-10-CM | POA: Diagnosis not present

## 2020-05-25 DIAGNOSIS — M542 Cervicalgia: Secondary | ICD-10-CM | POA: Diagnosis not present

## 2020-05-25 DIAGNOSIS — M9902 Segmental and somatic dysfunction of thoracic region: Secondary | ICD-10-CM | POA: Diagnosis not present

## 2020-05-27 DIAGNOSIS — M542 Cervicalgia: Secondary | ICD-10-CM | POA: Diagnosis not present

## 2020-05-27 DIAGNOSIS — M9901 Segmental and somatic dysfunction of cervical region: Secondary | ICD-10-CM | POA: Diagnosis not present

## 2020-05-27 DIAGNOSIS — M9902 Segmental and somatic dysfunction of thoracic region: Secondary | ICD-10-CM | POA: Diagnosis not present

## 2020-05-27 DIAGNOSIS — M5414 Radiculopathy, thoracic region: Secondary | ICD-10-CM | POA: Diagnosis not present

## 2020-05-28 DIAGNOSIS — M9902 Segmental and somatic dysfunction of thoracic region: Secondary | ICD-10-CM | POA: Diagnosis not present

## 2020-05-28 DIAGNOSIS — M542 Cervicalgia: Secondary | ICD-10-CM | POA: Diagnosis not present

## 2020-05-28 DIAGNOSIS — M5414 Radiculopathy, thoracic region: Secondary | ICD-10-CM | POA: Diagnosis not present

## 2020-05-28 DIAGNOSIS — M9901 Segmental and somatic dysfunction of cervical region: Secondary | ICD-10-CM | POA: Diagnosis not present

## 2020-05-29 ENCOUNTER — Ambulatory Visit: Payer: Medicare HMO | Admitting: Cardiology

## 2020-05-29 ENCOUNTER — Encounter: Payer: Self-pay | Admitting: Cardiology

## 2020-05-29 ENCOUNTER — Other Ambulatory Visit: Payer: Self-pay

## 2020-05-29 VITALS — BP 92/54 | HR 48 | Ht 67.0 in | Wt 184.0 lb

## 2020-05-29 DIAGNOSIS — I472 Ventricular tachycardia: Secondary | ICD-10-CM | POA: Diagnosis not present

## 2020-05-29 DIAGNOSIS — I2510 Atherosclerotic heart disease of native coronary artery without angina pectoris: Secondary | ICD-10-CM | POA: Diagnosis not present

## 2020-05-29 DIAGNOSIS — Z01812 Encounter for preprocedural laboratory examination: Secondary | ICD-10-CM | POA: Diagnosis not present

## 2020-05-29 DIAGNOSIS — R000 Tachycardia, unspecified: Secondary | ICD-10-CM

## 2020-05-29 DIAGNOSIS — I479 Paroxysmal tachycardia, unspecified: Secondary | ICD-10-CM

## 2020-05-29 DIAGNOSIS — Z951 Presence of aortocoronary bypass graft: Secondary | ICD-10-CM

## 2020-05-29 NOTE — Patient Instructions (Signed)
Medication Instructions:  - Your physician recommends that you continue on your current medications as directed. Please refer to the Current Medication list given to you today.  *If you need a refill on your cardiac medications before your next appointment, please call your pharmacy*   Lab Work: - Your physician recommends that you have lab work today: BMP   If you have labs (blood work) drawn today and your tests are completely normal, you will receive your results only by: [name]. MyChart Message (if you have MyChart) OR . A paper copy in the mail If you have any lab test that is abnormal or we need to change your treatment, we will call you to review the results.   Testing/Procedures:  1) Cardiac MRI  - You are scheduled for Cardiac MRI on  _________________________.  [facility] of [facility] at _______________________________ (30-45 minutes prior to test start time). ?   [facility]   [address]  [place], [place] [address]  [phone]  Proceed to the [facility] Radiology Department (First Floor).  ?  Magnetic resonance imaging (MRI) is a painless test that produces images of the inside of the body without using X-rays. During an MRI, strong magnets and radio waves work together in a Data processing managermagnetic field to form detailed images. MRI images may provide more details about a medical condition than X-rays, CT scans, and ultrasounds can provide.  - You may be given earphones to listen for instructions.  - You may eat a light breakfast and take medications as ordered with the exception of Lasix (furosemide)  If a contrast material will be used, an IV will be inserted into one of your veins. Contrast material will be injected into your IV.  - You will be asked to remove all metal, including: Watch, jewelry, and other metal objects including hearing aids, hair pieces and dentures. (Braces and fillings normally are not a problem.)   - If  contrast material was used:  It will leave your body through your urine within a day. You may be told to drink plenty of fluids to help flush the contrast material out of your system.   - TEST WILL TAKE APPROXIMATELY 1 HOUR  - PLEASE NOTIFY SCHEDULING AT LEAST 24 HOURS IN ADVANCE IF YOU ARE UNABLE TO KEEP YOUR APPOINTMENT.     - Your physician has recommended that you have an EP Study & possible ablation (after the Cardiac MRI has been completed). This test is used to assess serious arrhythmias (irregular heartbeats). During an Electro-physiology Study (EPS), a thin, flexible wire is passed through a vein in your groin (upper thigh) or neck up to the heart. The wire records the heart's electrical signals. Your doctor uses the wire to electrically stimulate your heart and trigger an arrhythmic. This allows the doctor to see whether an antiarrhythmia medicine can help manage the problem or if further procedures are necessary (i.e., ablation/ICD). Radiofrequency ablation, a procedure used to fix some types of arrthythmia, may be done during an EPS. This is done in the hospital and often requires an overnight stay.   We will call you to schedule this after we know the date of the Cardiac MRI.    Follow-Up: At [facility], you and your health needs are our priority.  As part of our continuing mission to provide you with exceptional heart care, we have created designated Provider Care Teams.  These Care Teams include your primary Cardiologist (  physician) and Advanced Practice Providers (APPs -  Physician Assistants and Nurse Practitioners) who all work together to provide you with the care you need, when you need it.  We recommend signing up for the patient portal called "MyChart".  Sign up information is provided on this After Visit Summary.  MyChart is used to connect with patients for Virtual Visits (Telemedicine).  Patients are able to view lab/test results, encounter notes, upcoming appointments,  etc.  Non-urgent messages can be sent to your provider as well.   To learn more about what you can do with MyChart, go to [email or web address].    Your next appointment:   Pending procedure dates   The format for your next appointment:   In Person  Provider:   Steffanie Dunn, MD   Other Instructions   Electrophysiology Study An electrophysiology (EP) study is a heart test to check how well the heart's electrical conduction system is working. The electrical conduction system uses electrical signals to make the heart beat. In this study, thin, flexible tubes (catheters) are placed in a large vein in your groin, arm, neck, or chest. A person may need this test if he or she has:  Dizziness or fainting.  An abnormal heart rhythm (arrhythmia), such as: ? A fast heartbeat (tachycardia). ? A slow heartbeat (bradycardia). ? An irregular heartbeat, such as atrial fibrillation. Tell a health care provider about:  Any allergies you have.  All medicines you are taking, including vitamins, herbs, eye drops, creams, and over-the-counter medicines.  Any problems you or family members have had with anesthetic medicines.  Any blood disorders you have.  Any surgeries you have had.  Any medical conditions you have or have had.  Whether you are pregnant or may be pregnant. What are the risks? Generally, this is a safe procedure. However, problems may occur, including:  Tachycardia that does not go away.  Bleeding or bruising around the insertion site.  Infection.  Temporary or permanent problems of the heart rhythm.  Temporary changes in blood pressure.  Puncture (perforation) of the heart wall or a blood vessel. This can cause bleeding and pressure between the heart and the sac that surrounds it (cardiac tamponade).  Severe heart problems, such as: ? Cardiac arrest. This is when the heart stops beating. ? Life-threatening arrhythmia.  Allergic reactions to medicines or  dyes.  Damage to nearby structures or organs. What happens before the procedure? Staying hydrated Follow instructions from your health care provider about hydration, which may include:  Up to 2 hours before the procedure - you may continue to drink clear liquids, such as water, clear fruit juice, black coffee, and plain tea.   Eating and drinking restrictions Follow instructions from your health care provider about eating and drinking, which may include:  8 hours before the procedure - stop eating heavy meals or foods, such as meat, fried foods, or fatty foods.  6 hours before the procedure - stop eating light meals or foods, such as toast or cereal.  6 hours before the procedure - stop drinking milk or drinks that contain milk.  2 hours before the procedure - stop drinking clear liquids. Medicines Ask your health care provider about:  Changing or stopping your regular medicines. This is especially important if you are taking diabetes medicines or blood thinners.  Taking medicines such as aspirin and ibuprofen. These medicines can thin your blood. Do not take these medicines unless your health care provider tells you to take them.  Taking over-the-counter  medicines, vitamins, herbs, and supplements. Surgery safety Ask your health care provider:  How your surgery site will be marked.  What steps will be taken to help prevent infection. These may include: ? Removing hair at the surgery site. ? Washing skin with a germ-killing soap. ? Taking antibiotic medicine. General instructions  Do not use any products that contain nicotine or tobacco for at least 4 weeks before the procedure. These products include cigarettes, e-cigarettes, and chewing tobacco. If you need help quitting, ask your health care provider.  Plan to have someone take you home from the hospital or clinic.  If you will be going home right after the procedure, plan to have someone with you for 24 hours. What  happens during the procedure?  An IV will be inserted into one of your veins.  You will be given one or more of the following: ? A medicine to help you relax (sedative). ? A medicine to numb the area (local anesthetic). ? A medicine to make you fall asleep (general anesthetic).  Catheters with an electrode tip will be inserted into a large vein. These electrode tips can measure the heart's electrical activity. They can also use electrical signals to change the heart rhythm.  The catheters will be guided to the heart using a type of X-ray machine (fluoroscopy). Once the catheters are in the heart, they will evaluate the electrical activity of your heart.  If you are awake during the EP study, you may feel dizzy or light-headed. Your heart rate may temporarily increase, or you may feel your heart beating hard. Tell your health care provider if you experience these things during the EP study: ? You feel dizzy or nauseous. ? You have chest pain or pressure.  The catheters will be removed.  Firm pressure will be applied to the insertion site to prevent bleeding.  A bandage (dressing) may be applied over the insertion site. The procedure may vary among health care providers and hospitals.   What happens after the procedure?  Your blood pressure, heart rate, breathing rate, and blood oxygen level will be monitored until you leave the hospital or clinic.  If you were given a sedative during the procedure, it can affect you for several hours. Do not drive or operate machinery until your health care provider says that it is safe.  You will need to lie flat for a few hours or as told by your health care provider. Keep your legs straight. Do not bend or cross your legs.  It is up to you to get the results of your procedure. Ask your health care provider, or the department that is doing the procedure, when your results will be ready.   Summary  An electrophysiology (EP) study is a heart test to  check how well the heart's electrical system is working.  This is generally a safe procedure. However, heart problems, infection, puncture of the heart, or bleeding around the insertion site may occur.  Follow your health care provider's instructions about eating and drinking restrictions and stopping or changing medicines before the procedure.  After the procedure, you will need to lie flat for a few hours or as told by your health care provider. This information is not intended to replace advice given to you by your health care provider. Make sure you discuss any questions you have with your health care provider. Document Revised: 01/03/2019 Document Reviewed: 01/03/2019 Elsevier Patient Education  2021 [employer]     Cardiac Ablation Cardiac  ablation is a procedure to destroy, or ablate, a small amount of heart tissue in very specific places. The heart has many electrical connections. Sometimes these connections are abnormal and can cause the heart to beat very fast or irregularly. Ablating some of the areas that cause problems can improve the heart's rhythm or return it to normal. Ablation may be done for people who:  Have Wolff-Parkinson-White syndrome.  Have fast heart rhythms (tachycardia).  Have taken medicines for an abnormal heart rhythm (arrhythmia) that were not effective or caused side effects.  Have a high-risk heartbeat that may be life-threatening. During the procedure, a small incision is made in the neck or the groin, and a long, thin tube (catheter) is inserted into the incision and moved to the heart. Small devices (electrodes) on the tip of the catheter will send out electrical currents. A type of X-ray (fluoroscopy) will be used to help guide the catheter and to provide images of the heart. Tell a health care provider about:  Any allergies you have.  All medicines you are taking, including vitamins, herbs, eye drops, creams, and over-the-counter  medicines.  Any problems you or family members have had with anesthetic medicines.  Any blood disorders you have.  Any surgeries you have had.  Any medical conditions you have, such as kidney failure.  Whether you are pregnant or may be pregnant. What are the risks? Generally, this is a safe procedure. However, problems may occur, including:  Infection.  Bruising and bleeding at the catheter insertion site.  Bleeding into the chest, especially into the sac that surrounds the heart. This is a serious complication.  Stroke or blood clots.  Damage to nearby structures or organs.  Allergic reaction to medicines or dyes.  Need for a permanent pacemaker if the normal electrical system is damaged. A pacemaker is a small computer that sends electrical signals to the heart and helps your heart beat normally.  The procedure not being fully effective. This may not be recognized until months later. Repeat ablation procedures are sometimes done. What happens before the procedure? Medicines Ask your health care provider about:  Changing or stopping your regular medicines. This is especially important if you are taking diabetes medicines or blood thinners.  Taking medicines such as aspirin and ibuprofen. These medicines can thin your blood. Do not take these medicines unless your health care provider tells you to take them.  Taking over-the-counter medicines, vitamins, herbs, and supplements. General instructions  Follow instructions from your health care provider about eating or drinking restrictions.  Plan to have someone take you home from the hospital or clinic.  If you will be going home right after the procedure, plan to have someone with you for 24 hours.  Ask your health care provider what steps will be taken to prevent infection. What happens during the procedure?  An IV will be inserted into one of your veins.  You will be given a medicine to help you relax  (sedative).  The skin on your neck or groin will be numbed.  An incision will be made in your neck or your groin.  A needle will be inserted through the incision and into a large vein in your neck or groin.  A catheter will be inserted into the needle and moved to your heart.  Dye may be injected through the catheter to help your surgeon see the area of the heart that needs treatment.  Electrical currents will be sent from the catheter to ablate  heart tissue in desired areas. There are three types of energy that may be used to do this: ? Heat (radiofrequency energy). ? Laser energy. ? Extreme cold (cryoablation).  When the tissue has been ablated, the catheter will be removed.  Pressure will be held on the insertion area to prevent a lot of bleeding.  A bandage (dressing) will be placed over the insertion area. The exact procedure may vary among health care providers and hospitals.   What happens after the procedure?  Your blood pressure, heart rate, breathing rate, and blood oxygen level will be monitored until you leave the hospital or clinic.  Your insertion area will be monitored for bleeding. You will need to lie still for a few hours to ensure that you do not bleed from the insertion area.  Do not drive for 24 hours or as long as told by your health care provider. Summary  Cardiac ablation is a procedure to destroy, or ablate, a small amount of heart tissue using an electrical current. This procedure can improve the heart rhythm or return it to normal.  Tell your health care provider about any medical conditions you may have and all medicines you are taking to treat them.  This is a safe procedure, but problems may occur. Problems may include infection, bruising, damage to nearby organs or structures, or allergic reactions to medicines.  Follow your health care provider's instructions about eating and drinking before the procedure. You may also be told to change or stop  some of your medicines.  After the procedure, do not drive for 24 hours or as long as told by your health care provider. This information is not intended to replace advice given to you by your health care provider. Make sure you discuss any questions you have with your health care provider. Document Revised: 02/18/2019 Document Reviewed: 02/18/2019 Elsevier Patient Education  2021 [employer].

## 2020-05-29 NOTE — Progress Notes (Signed)
Electrophysiology Office Note:    Date:  05/29/2020   ID:  Penny Reed, Penny Reed 1945-08-10, MRN [id number]  PCP:  Maple Hudson., MD  [facility] Cardiologist:  No primary care provider on file.  [facility] Electrophysiologist:  Lanier Prude, MD   Referring MD: Maple Hudson.,*   Chief Complaint: Palpitations  History of Present Illness:    Penny Reed is a 75 y.o. female who presents for an evaluation of palpitations at the request of Dr. Juliann Pares. Their medical history includes CAD post CABG, COPD, hypertension, hypothyroidism, hyperlipidemia.  She last saw Dr. Juliann Pares on May 20, 2020.  At that appointment, a Holter monitor was ordered for episodes of palpitations that occur 1 to 2 hours at a time.  At that appointment she reported having at least one episode a month.  She has tried to take nitroglycerin without impact.  She is taking an aspirin which also has not helped.  She is also going periods of 1 to 2 months without an episode.  No syncope.    Past Medical History:  Diagnosis Date  . Allergic rhinitis   . CAD (coronary artery disease)   . Complication of anesthesia    woke up during colonoscopy  . Hyperlipidemia   . Hypertension   . Hypothyroidism   . LVH (left ventricular hypertrophy)     Past Surgical History:  Procedure Laterality Date  . ABDOMINAL HYSTERECTOMY     has left ovary  . APPENDECTOMY    . CARDIAC CATHETERIZATION  09/2005   had 1 stent and 3 balloons placed  . COLONOSCOPY WITH PROPOFOL N/A 12/04/2019   Procedure: COLONOSCOPY WITH PROPOFOL;  Surgeon: Earline Mayotte, MD;  Location: [facility] ENDOSCOPY;  Service: Endoscopy;  Laterality: N/A;  . CORONARY ARTERY BYPASS GRAFT    . TONSILLECTOMY      Current Medications: Current Meds  Medication Sig  . albuterol (PROAIR HFA) 108 (90 BASE) MCG/ACT inhaler Inhale 2 puffs into the lungs every 6 (six) hours as needed for wheezing or shortness of breath.  [name] amLODipine  (NORVASC) 5 MG tablet TAKE 1 TABLET DAILY  . ANORO ELLIPTA 62.5-25 MCG/INH AEPB INHALE 1 PUFF BY MOUTH EVERY DAY  . aspirin 81 MG tablet Take 81 mg by mouth daily.  [name] atorvastatin (LIPITOR) 80 MG tablet Take 1 tablet (80 mg total) by mouth daily.  [name] azelastine (ASTELIN) 0.1 % nasal spray SPRAY 2 SPRAYS INTO EACH NOSTRIL EVERY DAY (Patient taking differently: Place 2 sprays into both nostrils daily. SPRAY 2 SPRAYS INTO EACH NOSTRIL EVERY DAY)  . Calcium Carbonate (CALCIUM 600 PO) Take 1 tablet by mouth 2 (two) times a week.   . cholecalciferol (VITAMIN D3) 25 MCG (1000 UNIT) tablet Take 5,000 Units by mouth daily.   . furosemide (LASIX) 40 MG tablet Take 20 mg by mouth as needed (for swelling).   [name] levothyroxine (SYNTHROID) 112 MCG tablet TAKE ONE TABLET BY MOUTH SIX DAYS PER WEEK. TAKE 1 AND 1/2 TABLET BY MOUTH ONE DAY PER WEEK.  [name] loratadine (CLARITIN) 10 MG tablet Take 10 mg by mouth daily.  . Multiple Vitamin (MULTIVITAMIN) tablet Take 1 tablet by mouth daily. Standard Process  . nitroGLYCERIN (NITROSTAT) 0.4 MG SL tablet Place 0.4 mg under the tongue every 5 (five) minutes as needed for chest pain.   . Omega-3 Fatty Acids (FISH OIL) 1000 MG CAPS Take 3 capsules by mouth daily.   . psyllium (REGULOID) 0.52 g capsule Take 2  g by mouth daily. Fiber capsules  . quinapril (ACCUPRIL) 40 MG tablet Take 1 tablet (40 mg total) by mouth daily.  [name] zolpidem (AMBIEN) 5 MG tablet TAKE 1 TABLET (5 MG TOTAL) BY MOUTH AT BEDTIME AS NEEDED FOR SLEEP.     Allergies:   Adhesive [tape] and Azithromycin   Social History   Socioeconomic History  . Marital status: Married    Spouse name: Not on file  . Number of children: 3  . Years of education: Not on file  . Highest education level: Associate degree: occupational, Scientist, product/process development, or vocational program  Occupational History  . Not on file  Tobacco Use  . Smoking status: Former Smoker    Packs/day: 0.25    Years: 25.00    Pack years: 6.25    Types:  Cigarettes    Quit date: 04/25/2005    Years since quitting: 15.1  . Smokeless tobacco: Never Used  Vaping Use  . Vaping Use: Never used  Substance and Sexual Activity  . Alcohol use: Yes    Alcohol/week: 5.0 standard drinks    Types: 5 Glasses of wine per week    Comment: spread out between 2-3 nights a week  . Drug use: No  . Sexual activity: Not on file  Other Topics Concern  . Not on file  Social History Narrative  . Not on file   Social Determinants of Health   Financial Resource Strain: Low Risk   . Difficulty of Paying Living Expenses: Not hard at all  Food Insecurity: No Food Insecurity  . Worried About Programme researcher, broadcasting/film/video in the Last Year: Never true  . Ran Out of Food in the Last Year: Never true  Transportation Needs: No Transportation Needs  . Lack of Transportation (Medical): No  . Lack of Transportation (Non-Medical): No  Physical Activity: Sufficiently Active  . Days of Exercise per Week: 6 days  . Minutes of Exercise per Session: 60 min  Stress: No Stress Concern Present  . Feeling of Stress : Not at all  Social Connections: Moderately Integrated  . Frequency of Communication with Friends and Family: More than three times a week  . Frequency of Social Gatherings with Friends and Family: Three times a week  . Attends Religious Services: Never  . Active Member of Clubs or Organizations: Yes  . Attends Banker Meetings: More than 4 times per year  . Marital Status: Married     Family History: The patient's family history includes Asthma in her sister; Breast cancer in her maternal aunt; Breast cancer (age of onset: 28) in her sister; Cancer in her sister; Cancer - Colon in her paternal grandmother; Coronary artery disease in her father; Diabetes in her mother; Heart attack in her brother and father; Hyperlipidemia in her sister; Hypertension in her sister; Scleroderma in her sister; Stroke in her mother; Thyroid disease in her sister.  ROS:    Please see the history of present illness.    All other systems reviewed and are negative.  EKGs/Labs/Other Studies Reviewed:    The following studies were reviewed today:  09/09/2019 Echo (report only) INTERPRETATION  NORMAL LEFT VENTRICULAR SYSTOLIC FUNCTION  WITH MILD LVH  NORMAL RIGHT VENTRICULAR SYSTOLIC FUNCTION  MILD VALVULAR REGURGITATION (See above)  NO VALVULAR STENOSIS  MILD AR, MR, TR  TRIVIAL PR  EF 50-55%   May 22, 2020 preventatives recordings Sinus rhythm at baseline with an episode of a wide-complex tachycardia with a ventricular rate of 163 bpm  that lasted 14 seconds. The event occurred on May 20, 2020 at 7:16 PM.  Sep 09, 2019 nuclear stress report only Bruce protocol achieving 10.1 METS Maximal heart rate 114 bpm No ventricular arrhythmias during exercise portion of the study   EKG:  The ekg ordered today demonstrates sinus bradycardia with a ventricular rate of 48 bpm. No pathologic Q waves. QTC is 414 ms. No preexcitation.  Recent Labs: 07/23/2019: BUN 14; Creatinine, Ser 0.74; Hemoglobin 14.1; Platelets 181; Potassium 3.7; Sodium 136 07/25/2019: B Natriuretic Peptide 238.0  Recent Lipid Panel    Component Value Date/Time   CHOL 184 09/05/2018 1149   TRIG 77 09/05/2018 1149   HDL 89 09/05/2018 1149   CHOLHDL 2.1 09/05/2018 1149   LDLCALC 80 09/05/2018 1149    Physical Exam:    VS:  BP (!) 92/54   Pulse (!) 48   Ht 5\' 7"  (1.702 m)   Wt 184 lb (83.5 kg)   BMI 28.82 kg/m     Wt Readings from Last 3 Encounters:  05/29/20 184 lb (83.5 kg)  12/04/19 170 lb 14.4 oz (77.5 kg)  11/13/19 179 lb 12.8 oz (81.6 kg)     GEN:  Well nourished, well developed in no acute distress HEENT: Normal NECK: No JVD; No carotid bruits LYMPHATICS: No lymphadenopathy CARDIAC: RRR, no murmurs, rubs, gallops RESPIRATORY:  Clear to auscultation without rales, wheezing or rhonchi  ABDOMEN: Soft, non-tender, non-distended MUSCULOSKELETAL:  No edema; No  deformity  SKIN: Warm and dry NEUROLOGIC:  Alert and oriented x 3 PSYCHIATRIC:  Normal affect   ASSESSMENT:    1. Paroxysmal tachycardia (HCC)   2. Arteriosclerosis of coronary artery   3. Pre-procedure lab exam    PLAN:    In order of problems listed above:  1. Wide-complex tachycardia Patient has preventives monitor demonstrating 14 seconds of wide-complex tachycardia with a ventricular rate of 165 bpm. Patient also reports relatively frequent episodes of tachycardia lasting 1 to 2 hours. While these episodes are symptomatic with palpitations and rapid heartbeat, they seem to be hemodynamically well-tolerated. Based on her history alone, I favor these to be SVT with aberrant conduction although with her history of coronary artery disease I am concerned these could represent ventricular tachycardia. Treatment options are very limited for this tachycardia given her resting bradycardia and hypotension (90s systolic during today's visit). She has had varying degrees of left ventricular dysfunction in the past with the most recent EF measured as 50%.  We spent a lot of time today discussing the possibility of this rhythm representing SVT or VT. We discussed the implications of each possibility and what the treatment options may be. She clearly needs further risk ratification.  I would like to start with a cardiac MRI to assess her left ventricular function and degree of cardiac fibrosis. High degree of cardiac fibrosis on the MRI would make her much higher risk for future cardiac arrhythmias and sudden cardiac death.  I would also like to perform an EP study to look for inducible supraventricular arrhythmias that could account for her 1 to 2-hour long episodes of palpitations and tachycardia. If any are identified on the day of the EP study we will plan to treat with ablation. I will also plan to perform a [place] protocol ventricular stimulation exam to assess her risk of malignant ventricular  arrhythmias. If she had inducible sustained monomorphic ventricular tachycardia, would plan to implant a defibrillator. After long discussion with the patient and her husband who is with  her today, plan to proceed with scheduling the cardiac MRI and EP study. If we need to implant a defibrillator we will plan to regroup and discuss a defibrillator implant in detail before scheduling.   Also had a chance to discuss her case with Dr Juliann Pares.     Medication Adjustments/Labs and Tests Ordered: Current medicines are reviewed at length with the patient today.  Concerns regarding medicines are outlined above.  Orders Placed This Encounter  Procedures  . MR Card Morphology Wo/W Cm  . Basic metabolic panel  . EKG 12-Lead   No orders of the defined types were placed in this encounter.    Signed, Steffanie Dunn, MD, [facility]  05/29/2020 12:22 PM    Electrophysiology [facility]

## 2020-05-30 LAB — BASIC METABOLIC PANEL
BUN/Creatinine Ratio: 22 (ref 12–28)
BUN: 18 mg/dL (ref 8–27)
CO2: 23 mmol/L (ref 20–29)
Calcium: 10.1 mg/dL (ref 8.7–10.3)
Chloride: 102 mmol/L (ref 96–106)
Creatinine, Ser: 0.83 mg/dL (ref 0.57–1.00)
GFR calc Af Amer: 81 mL/min/{1.73_m2} (ref >59)
GFR calc non Af Amer: 71 mL/min/{1.73_m2} (ref >59)
Glucose: 87 mg/dL (ref 65–99)
Potassium: 4.6 mmol/L (ref 3.5–5.2)
Sodium: 141 mmol/L (ref 134–144)

## 2020-06-01 DIAGNOSIS — M542 Cervicalgia: Secondary | ICD-10-CM | POA: Diagnosis not present

## 2020-06-01 DIAGNOSIS — M5414 Radiculopathy, thoracic region: Secondary | ICD-10-CM | POA: Diagnosis not present

## 2020-06-01 DIAGNOSIS — M9901 Segmental and somatic dysfunction of cervical region: Secondary | ICD-10-CM | POA: Diagnosis not present

## 2020-06-01 DIAGNOSIS — M9902 Segmental and somatic dysfunction of thoracic region: Secondary | ICD-10-CM | POA: Diagnosis not present

## 2020-06-02 ENCOUNTER — Telehealth: Payer: Self-pay | Admitting: Cardiology

## 2020-06-02 NOTE — Telephone Encounter (Signed)
Spoke with patient regarding preferred weekdays and times for scheduling the Cardiac MRI ordered by Dr. Lalla Brothers.  Informed patient I will be in touch once we hear from the insurance prior authorization

## 2020-06-03 DIAGNOSIS — M9901 Segmental and somatic dysfunction of cervical region: Secondary | ICD-10-CM | POA: Diagnosis not present

## 2020-06-03 DIAGNOSIS — M9902 Segmental and somatic dysfunction of thoracic region: Secondary | ICD-10-CM | POA: Diagnosis not present

## 2020-06-03 DIAGNOSIS — M542 Cervicalgia: Secondary | ICD-10-CM | POA: Diagnosis not present

## 2020-06-03 DIAGNOSIS — M5414 Radiculopathy, thoracic region: Secondary | ICD-10-CM | POA: Diagnosis not present

## 2020-06-05 DIAGNOSIS — M9902 Segmental and somatic dysfunction of thoracic region: Secondary | ICD-10-CM | POA: Diagnosis not present

## 2020-06-05 DIAGNOSIS — M542 Cervicalgia: Secondary | ICD-10-CM | POA: Diagnosis not present

## 2020-06-05 DIAGNOSIS — M9901 Segmental and somatic dysfunction of cervical region: Secondary | ICD-10-CM | POA: Diagnosis not present

## 2020-06-05 DIAGNOSIS — M5414 Radiculopathy, thoracic region: Secondary | ICD-10-CM | POA: Diagnosis not present

## 2020-06-13 DIAGNOSIS — M9901 Segmental and somatic dysfunction of cervical region: Secondary | ICD-10-CM | POA: Diagnosis not present

## 2020-06-13 DIAGNOSIS — M5414 Radiculopathy, thoracic region: Secondary | ICD-10-CM | POA: Diagnosis not present

## 2020-06-13 DIAGNOSIS — M9902 Segmental and somatic dysfunction of thoracic region: Secondary | ICD-10-CM | POA: Diagnosis not present

## 2020-06-13 DIAGNOSIS — M542 Cervicalgia: Secondary | ICD-10-CM | POA: Diagnosis not present

## 2020-06-15 ENCOUNTER — Encounter: Payer: Self-pay | Admitting: Cardiology

## 2020-06-15 NOTE — Telephone Encounter (Signed)
Spoke with patient regarding 07/20/20 Cardiac MRI scheduled Monday 07/20/20 at 8:00 am at [facility]---arrival time is 7:30 am--1st floor admissions office for check in.  Will mail information to patient and it is also available in My Chart

## 2020-06-16 DIAGNOSIS — M5414 Radiculopathy, thoracic region: Secondary | ICD-10-CM | POA: Diagnosis not present

## 2020-06-16 DIAGNOSIS — M542 Cervicalgia: Secondary | ICD-10-CM | POA: Diagnosis not present

## 2020-06-16 DIAGNOSIS — M9902 Segmental and somatic dysfunction of thoracic region: Secondary | ICD-10-CM | POA: Diagnosis not present

## 2020-06-16 DIAGNOSIS — M9901 Segmental and somatic dysfunction of cervical region: Secondary | ICD-10-CM | POA: Diagnosis not present

## 2020-06-19 DIAGNOSIS — M9901 Segmental and somatic dysfunction of cervical region: Secondary | ICD-10-CM | POA: Diagnosis not present

## 2020-06-19 DIAGNOSIS — M542 Cervicalgia: Secondary | ICD-10-CM | POA: Diagnosis not present

## 2020-06-19 DIAGNOSIS — M9902 Segmental and somatic dysfunction of thoracic region: Secondary | ICD-10-CM | POA: Diagnosis not present

## 2020-06-19 DIAGNOSIS — M5414 Radiculopathy, thoracic region: Secondary | ICD-10-CM | POA: Diagnosis not present

## 2020-06-23 DIAGNOSIS — M9902 Segmental and somatic dysfunction of thoracic region: Secondary | ICD-10-CM | POA: Diagnosis not present

## 2020-06-23 DIAGNOSIS — M5414 Radiculopathy, thoracic region: Secondary | ICD-10-CM | POA: Diagnosis not present

## 2020-06-23 DIAGNOSIS — M9901 Segmental and somatic dysfunction of cervical region: Secondary | ICD-10-CM | POA: Diagnosis not present

## 2020-06-23 DIAGNOSIS — M542 Cervicalgia: Secondary | ICD-10-CM | POA: Diagnosis not present

## 2020-06-26 DIAGNOSIS — M542 Cervicalgia: Secondary | ICD-10-CM | POA: Diagnosis not present

## 2020-06-26 DIAGNOSIS — M5414 Radiculopathy, thoracic region: Secondary | ICD-10-CM | POA: Diagnosis not present

## 2020-06-26 DIAGNOSIS — M9902 Segmental and somatic dysfunction of thoracic region: Secondary | ICD-10-CM | POA: Diagnosis not present

## 2020-06-26 DIAGNOSIS — M9901 Segmental and somatic dysfunction of cervical region: Secondary | ICD-10-CM | POA: Diagnosis not present

## 2020-06-30 DIAGNOSIS — M542 Cervicalgia: Secondary | ICD-10-CM | POA: Diagnosis not present

## 2020-06-30 DIAGNOSIS — M9901 Segmental and somatic dysfunction of cervical region: Secondary | ICD-10-CM | POA: Diagnosis not present

## 2020-06-30 DIAGNOSIS — M9902 Segmental and somatic dysfunction of thoracic region: Secondary | ICD-10-CM | POA: Diagnosis not present

## 2020-06-30 DIAGNOSIS — M5414 Radiculopathy, thoracic region: Secondary | ICD-10-CM | POA: Diagnosis not present

## 2020-07-02 DIAGNOSIS — M9902 Segmental and somatic dysfunction of thoracic region: Secondary | ICD-10-CM | POA: Diagnosis not present

## 2020-07-02 DIAGNOSIS — M542 Cervicalgia: Secondary | ICD-10-CM | POA: Diagnosis not present

## 2020-07-02 DIAGNOSIS — M9901 Segmental and somatic dysfunction of cervical region: Secondary | ICD-10-CM | POA: Diagnosis not present

## 2020-07-02 DIAGNOSIS — M5414 Radiculopathy, thoracic region: Secondary | ICD-10-CM | POA: Diagnosis not present

## 2020-07-03 ENCOUNTER — Other Ambulatory Visit: Payer: Self-pay | Admitting: Family Medicine

## 2020-07-03 DIAGNOSIS — J309 Allergic rhinitis, unspecified: Secondary | ICD-10-CM

## 2020-07-06 ENCOUNTER — Telehealth: Payer: Self-pay

## 2020-07-06 NOTE — Progress Notes (Signed)
Chronic Care Management Pharmacy Assistant   Name: Penny Reed  MRN: [id number] DOB: Jun 19, 1945   Reason for Encounter:Hypertension Disease State Call.   Conditions to be addressed/monitored: HTN    Recent office visits:  None ID   Recent consult visits:  05/20/2020 Cardiology [facility] 05/29/2020 cardiology [facility] visits:  None in previous 6 months  Medications: Outpatient Encounter Medications as of 07/06/2020  Medication Sig  . albuterol (PROAIR HFA) 108 (90 BASE) MCG/ACT inhaler Inhale 2 puffs into the lungs every 6 (six) hours as needed for wheezing or shortness of breath.  [name] amLODipine (NORVASC) 5 MG tablet TAKE 1 TABLET DAILY  . ANORO ELLIPTA 62.5-25 MCG/INH AEPB INHALE 1 PUFF BY MOUTH EVERY DAY  . aspirin 81 MG tablet Take 81 mg by mouth daily.  [name] atorvastatin (LIPITOR) 80 MG tablet Take 1 tablet (80 mg total) by mouth daily.  [name] azelastine (ASTELIN) 0.1 % nasal spray SPRAY 2 SPRAYS INTO EACH NOSTRIL EVERY DAY  . Calcium Carbonate (CALCIUM 600 PO) Take 1 tablet by mouth 2 (two) times a week.   . cholecalciferol (VITAMIN D3) 25 MCG (1000 UNIT) tablet Take 5,000 Units by mouth daily.   . furosemide (LASIX) 40 MG tablet Take 20 mg by mouth as needed (for swelling).   [name] levothyroxine (SYNTHROID) 112 MCG tablet TAKE ONE TABLET BY MOUTH SIX DAYS PER WEEK. TAKE 1 AND 1/2 TABLET BY MOUTH ONE DAY PER WEEK.  [name] loratadine (CLARITIN) 10 MG tablet Take 10 mg by mouth daily.  . Multiple Vitamin (MULTIVITAMIN) tablet Take 1 tablet by mouth daily. Standard Process  . nitroGLYCERIN (NITROSTAT) 0.4 MG SL tablet Place 0.4 mg under the tongue every 5 (five) minutes as needed for chest pain.   . Omega-3 Fatty Acids (FISH OIL) 1000 MG CAPS Take 3 capsules by mouth daily.   . psyllium (REGULOID) 0.52 g capsule Take 2 g by mouth daily. Fiber capsules  . quinapril (ACCUPRIL) 40 MG tablet Take 1 tablet (40 mg total) by mouth daily.  [name] zolpidem (AMBIEN) 5 MG  tablet TAKE 1 TABLET (5 MG TOTAL) BY MOUTH AT BEDTIME AS NEEDED FOR SLEEP.   No facility-administered encounter medications on file as of 07/06/2020.    Star Rating Drugs:atorvastatin,quinapril  Reviewed chart prior to disease state call. Spoke with patient regarding BP  Recent Office Vitals: BP Readings from Last 3 Encounters:  05/29/20 (!) 92/54  12/04/19 139/70  11/13/19 139/61   Pulse Readings from Last 3 Encounters:  05/29/20 (!) 48  12/04/19 (!) 58  11/13/19 (!) 48    Wt Readings from Last 3 Encounters:  05/29/20 184 lb (83.5 kg)  12/04/19 170 lb 14.4 oz (77.5 kg)  11/13/19 179 lb 12.8 oz (81.6 kg)     Kidney Function Lab Results  Component Value Date/Time   CREATININE 0.83 05/29/2020 11:25 AM   CREATININE 0.74 07/23/2019 10:34 AM   GFRNONAA 71 05/29/2020 11:25 AM   GFRAA 81 05/29/2020 11:25 AM    BMP Latest Ref Rng & Units 05/29/2020 07/23/2019 09/05/2018  Glucose 65 - 99 mg/dL 87 409(W) 97  BUN 8 - 27 mg/dL 18 14 19   Creatinine 0.57 - 1.00 mg/dL 1.19 1.47  BUN/Creat Ratio 12 - 28 22 - 22  Sodium 134 - 144 mmol/L 141 136 135  Potassium 3.5 - 5.2 mmol/L 4.6 3.7 4.0  Chloride 96 - 106 mmol/L 102 99 96  CO2 20 - 29 mmol/L 23 25 -  Calcium  8.7 - 10.3 mg/dL 53.6 9.6 64.4    . Current antihypertensive regimen:  ? Amlodipine 5mg  daily ? Quinapril 40mg  daily . How often are you checking your Blood Pressure? infrequently . Current home BP readings:  o Patient states her blood pressure ranges around 122/65. What recent interventions/DTPs have been made by any provider to improve Blood Pressure control since last CPP Visit: None ID . Any recent hospitalizations or ED visits since last visit with CPP? No . What diet changes have been made to improve Blood Pressure Control?  o None ID . What exercise is being done to improve your Blood Pressure Control?  o Patient reports she exercise six days a week.   Adherence Review: Is the patient currently on ACE/ARB  medication? Yes Does the patient have >5 day gap between last estimated fill dates? No   Clinical [name] [phone]

## 2020-07-08 DIAGNOSIS — M542 Cervicalgia: Secondary | ICD-10-CM | POA: Diagnosis not present

## 2020-07-08 DIAGNOSIS — M5414 Radiculopathy, thoracic region: Secondary | ICD-10-CM | POA: Diagnosis not present

## 2020-07-08 DIAGNOSIS — M9902 Segmental and somatic dysfunction of thoracic region: Secondary | ICD-10-CM | POA: Diagnosis not present

## 2020-07-08 DIAGNOSIS — M9901 Segmental and somatic dysfunction of cervical region: Secondary | ICD-10-CM | POA: Diagnosis not present

## 2020-07-15 DIAGNOSIS — I10 Essential (primary) hypertension: Secondary | ICD-10-CM | POA: Diagnosis not present

## 2020-07-15 DIAGNOSIS — Z951 Presence of aortocoronary bypass graft: Secondary | ICD-10-CM | POA: Diagnosis not present

## 2020-07-15 DIAGNOSIS — R001 Bradycardia, unspecified: Secondary | ICD-10-CM | POA: Diagnosis not present

## 2020-07-15 DIAGNOSIS — R55 Syncope and collapse: Secondary | ICD-10-CM | POA: Diagnosis not present

## 2020-07-15 DIAGNOSIS — E782 Mixed hyperlipidemia: Secondary | ICD-10-CM | POA: Diagnosis not present

## 2020-07-15 DIAGNOSIS — R0600 Dyspnea, unspecified: Secondary | ICD-10-CM | POA: Diagnosis not present

## 2020-07-15 DIAGNOSIS — R000 Tachycardia, unspecified: Secondary | ICD-10-CM | POA: Diagnosis not present

## 2020-07-15 DIAGNOSIS — I2510 Atherosclerotic heart disease of native coronary artery without angina pectoris: Secondary | ICD-10-CM | POA: Diagnosis not present

## 2020-07-15 DIAGNOSIS — I208 Other forms of angina pectoris: Secondary | ICD-10-CM | POA: Diagnosis not present

## 2020-07-15 DIAGNOSIS — R002 Palpitations: Secondary | ICD-10-CM | POA: Diagnosis not present

## 2020-07-16 DIAGNOSIS — M9902 Segmental and somatic dysfunction of thoracic region: Secondary | ICD-10-CM | POA: Diagnosis not present

## 2020-07-16 DIAGNOSIS — M542 Cervicalgia: Secondary | ICD-10-CM | POA: Diagnosis not present

## 2020-07-16 DIAGNOSIS — M9901 Segmental and somatic dysfunction of cervical region: Secondary | ICD-10-CM | POA: Diagnosis not present

## 2020-07-16 DIAGNOSIS — M5414 Radiculopathy, thoracic region: Secondary | ICD-10-CM | POA: Diagnosis not present

## 2020-07-17 ENCOUNTER — Telehealth (HOSPITAL_COMMUNITY): Payer: Self-pay | Admitting: Emergency Medicine

## 2020-07-17 NOTE — Telephone Encounter (Signed)
Attempted to call patient regarding upcoming cardiac MR appointment. Left message on voicemail with name and callback number Briannie Gutierrez RN Navigator Cardiac Imaging [facility] [phone] Office [phone] Cell  

## 2020-07-17 NOTE — Telephone Encounter (Signed)
Pt returning phone call regarding upcoming cardiac imaging study; pt verbalizes understanding of appt date/time, parking situation and where to check in, and verified current allergies; name and call back number provided for further questions should they arise Penny Alexandria RN Navigator Cardiac Imaging [facility] [phone] office [phone] cell  Pt denies implants, denies claustro Penny Reed

## 2020-07-20 ENCOUNTER — Other Ambulatory Visit: Payer: Self-pay

## 2020-07-20 ENCOUNTER — Ambulatory Visit (HOSPITAL_COMMUNITY)
Admission: RE | Admit: 2020-07-20 | Discharge: 2020-07-20 | Disposition: A | Discharge status: Home / Self Care | Payer: Medicare HMO | Source: Ambulatory Visit | Attending: Cardiology | Admitting: Cardiology

## 2020-07-20 DIAGNOSIS — I2510 Atherosclerotic heart disease of native coronary artery without angina pectoris: Secondary | ICD-10-CM

## 2020-07-20 DIAGNOSIS — I479 Paroxysmal tachycardia, unspecified: Secondary | ICD-10-CM | POA: Insufficient documentation

## 2020-07-20 MED ORDER — GADOBUTROL 1 MMOL/ML IV SOLN
10.0000 mL | Freq: Once | INTRAVENOUS | Status: AC | PRN
  Administered 2020-07-20: 10 mL via INTRAVENOUS

## 2020-07-28 ENCOUNTER — Ambulatory Visit: Payer: Medicare HMO | Admitting: Cardiology

## 2020-07-28 ENCOUNTER — Encounter: Payer: Self-pay | Admitting: Cardiology

## 2020-07-28 ENCOUNTER — Other Ambulatory Visit: Payer: Self-pay

## 2020-07-28 ENCOUNTER — Encounter: Payer: Self-pay | Admitting: *Deleted

## 2020-07-28 VITALS — BP 120/68 | HR 47 | Ht 67.0 in | Wt 184.4 lb

## 2020-07-28 DIAGNOSIS — R000 Tachycardia, unspecified: Secondary | ICD-10-CM

## 2020-07-28 DIAGNOSIS — I472 Ventricular tachycardia: Secondary | ICD-10-CM

## 2020-07-28 DIAGNOSIS — I2510 Atherosclerotic heart disease of native coronary artery without angina pectoris: Secondary | ICD-10-CM

## 2020-07-28 DIAGNOSIS — Z951 Presence of aortocoronary bypass graft: Secondary | ICD-10-CM

## 2020-07-28 NOTE — Patient Instructions (Addendum)
Medication Instructions:  Your physician recommends that you continue on your current medications as directed. Please refer to the Current Medication list given to you today.  Labwork: None ordered.  Testing/Procedures: Your physician has recommended that you have an ablation. Catheter ablation is a medical procedure used to treat some cardiac arrhythmias (irregular heartbeats). During catheter ablation, a long, thin, flexible tube is put into a blood vessel in your groin (upper thigh), or neck. This tube is called an ablation catheter. It is then guided to your heart through the blood vessel. Radio frequency waves destroy small areas of heart tissue where abnormal heartbeats may cause an arrhythmia to start. Please see the instruction sheet given to you today.   Any Other Special Instructions Will Be Listed Below (If Applicable).  If you need a refill on your cardiac medications before your next appointment, please call your pharmacy.    Cardiac Ablation Cardiac ablation is a procedure to destroy (ablate) some heart tissue that is sending bad signals. These bad signals cause problems in heart rhythm. The heart has many areas that make these signals. If there are problems in these areas, they can make the heart beat in a way that is not normal. Destroying some tissues can help make the heart rhythm normal. Tell your doctor about:  Any allergies you have.  All medicines you are taking. These include vitamins, herbs, eye drops, creams, and over-the-counter medicines.  Any problems you or family members have had with medicines that make you fall asleep (anesthetics).  Any blood disorders you have.  Any surgeries you have had.  Any medical conditions you have, such as kidney failure.  Whether you are pregnant or may be pregnant. What are the risks? This is a safe procedure. But problems may occur, including:  Infection.  Bruising and bleeding.  Bleeding into the chest.  Stroke  or blood clots.  Damage to nearby areas of your body.  Allergies to medicines or dyes.  The need for a pacemaker if the normal system is damaged.  Failure of the procedure to treat the problem. What happens before the procedure? Medicines Ask your doctor about:  Changing or stopping your normal medicines. This is important.  Taking aspirin and ibuprofen. Do not take these medicines unless your doctor tells you to take them.  Taking other medicines, vitamins, herbs, and supplements. General instructions  Follow instructions from your doctor about what you cannot eat or drink.  Plan to have someone take you home from the hospital or clinic.  If you will be going home right after the procedure, plan to have someone with you for 24 hours.  Ask your doctor what steps will be taken to prevent infection. What happens during the procedure?  An IV tube will be put into one of your veins.  You will be given a medicine to help you relax.  The skin on your neck or groin will be numbed.  A cut (incision) will be made in your neck or groin. A needle will be put through your cut and into a large vein.  A tube (catheter) will be put into the needle. The tube will be moved to your heart.  Dye may be put through the tube. This helps your doctor see your heart.  Small devices (electrodes) on the tube will send out signals.  A type of energy will be used to destroy some heart tissue.  The tube will be taken out.  Pressure will be held on your cut.   This helps stop bleeding.  A bandage will be put over your cut. The exact procedure may vary among doctors and hospitals.   What happens after the procedure?  You will be watched until you leave the hospital or clinic. This includes checking your heart rate, breathing rate, oxygen, and blood pressure.  Your cut will be watched for bleeding. You will need to lie still for a few hours.  Do not drive for 24 hours or as long as your doctor  tells you. Summary  Cardiac ablation is a procedure to destroy some heart tissue. This is done to treat heart rhythm problems.  Tell your doctor about any medical conditions you may have. Tell him or her about all medicines you are taking to treat them.  This is a safe procedure. But problems may occur. These include infection, bruising, bleeding, and damage to nearby areas of your body.  Follow what your doctor tells you about food and drink. You may also be told to change or stop some of your medicines.  After the procedure, do not drive for 24 hours or as long as your doctor tells you. This information is not intended to replace advice given to you by your health care provider. Make sure you discuss any questions you have with your health care provider. Document Revised: 03/14/2019 Document Reviewed: 03/14/2019 Elsevier Patient Education  2021 [employer]      

## 2020-07-28 NOTE — Progress Notes (Signed)
Electrophysiology Office Follow up Visit Note:    Date:  07/28/2020   ID:  Penny Reed, DOB 12-17-1945, MRN [id number]  PCP:  Maple Hudson., MD  [facility] Cardiologist:  No primary care provider on file.  [facility] Electrophysiologist:  Lanier Prude, MD    Interval History:    Penny Reed is a 75 y.o. female who presents for a follow up visit. They were last seen in clinic May 29, 2020 for palpitations.  At that appointment, I felt like her symptoms were related to an SVT with aberrant conduction.  We ordered a cardiac MRI at the last appointment to assess the burden of scar.  We planned to eventually perform an EP study with possible ablation to further investigate her episodes of palpitations.  She saw Dr. Juliann Pares in follow-up July 15, 2020.  She continues to have symptoms but none that have lasted for prolonged amount of time.  She is with her husband today in clinic.     Past Medical History:  Diagnosis Date  . Allergic rhinitis   . CAD (coronary artery disease)   . Complication of anesthesia    woke up during colonoscopy  . Hyperlipidemia   . Hypertension   . Hypothyroidism   . LVH (left ventricular hypertrophy)     Past Surgical History:  Procedure Laterality Date  . ABDOMINAL HYSTERECTOMY     has left ovary  . APPENDECTOMY    . CARDIAC CATHETERIZATION  09/2005   had 1 stent and 3 balloons placed  . COLONOSCOPY WITH PROPOFOL N/A 12/04/2019   Procedure: COLONOSCOPY WITH PROPOFOL;  Surgeon: Earline Mayotte, MD;  Location: [facility] ENDOSCOPY;  Service: Endoscopy;  Laterality: N/A;  . CORONARY ARTERY BYPASS GRAFT    . TONSILLECTOMY      Current Medications: Current Meds  Medication Sig  . albuterol (PROAIR HFA) 108 (90 BASE) MCG/ACT inhaler Inhale 2 puffs into the lungs every 6 (six) hours as needed for wheezing or shortness of breath.  [name] amLODipine (NORVASC) 5 MG tablet TAKE 1 TABLET DAILY  . ANORO ELLIPTA 62.5-25 MCG/INH  AEPB INHALE 1 PUFF BY MOUTH EVERY DAY  . aspirin 81 MG tablet Take 81 mg by mouth daily.  [name] atorvastatin (LIPITOR) 80 MG tablet Take 1 tablet (80 mg total) by mouth daily.  [name] azelastine (ASTELIN) 0.1 % nasal spray SPRAY 2 SPRAYS INTO EACH NOSTRIL EVERY DAY  . Calcium Carbonate (CALCIUM 600 PO) Take 1 tablet by mouth 2 (two) times a week.   . cholecalciferol (VITAMIN D3) 25 MCG (1000 UNIT) tablet Take 5,000 Units by mouth daily.   [name] levothyroxine (SYNTHROID) 112 MCG tablet TAKE ONE TABLET BY MOUTH SIX DAYS PER WEEK. TAKE 1 AND 1/2 TABLET BY MOUTH ONE DAY PER WEEK.  [name] loratadine (CLARITIN) 10 MG tablet Take 10 mg by mouth daily.  . Multiple Vitamin (MULTIVITAMIN) tablet Take 1 tablet by mouth daily. Standard Process  . nitroGLYCERIN (NITROSTAT) 0.4 MG SL tablet Place 0.4 mg under the tongue every 5 (five) minutes as needed for chest pain.   . Omega-3 Fatty Acids (FISH OIL) 1000 MG CAPS Take 3 capsules by mouth daily.   . psyllium (REGULOID) 0.52 g capsule Take 2 g by mouth daily. Fiber capsules  . quinapril (ACCUPRIL) 40 MG tablet Take 1 tablet (40 mg total) by mouth daily.  [name] zolpidem (AMBIEN) 5 MG tablet TAKE 1 TABLET (5 MG TOTAL) BY MOUTH AT BEDTIME AS NEEDED FOR SLEEP.  Allergies:   Adhesive [tape] and Azithromycin   Social History   Socioeconomic History  . Marital status: Married    Spouse name: Not on file  . Number of children: 3  . Years of education: Not on file  . Highest education level: Associate degree: occupational, Scientist, product/process development, or vocational program  Occupational History  . Not on file  Tobacco Use  . Smoking status: Former Smoker    Packs/day: 0.25    Years: 25.00    Pack years: 6.25    Types: Cigarettes    Quit date: 04/25/2005    Years since quitting: 15.2  . Smokeless tobacco: Never Used  Vaping Use  . Vaping Use: Never used  Substance and Sexual Activity  . Alcohol use: Yes    Alcohol/week: 5.0 standard drinks    Types: 5 Glasses of wine per week     Comment: spread out between 2-3 nights a week  . Drug use: No  . Sexual activity: Not on file  Other Topics Concern  . Not on file  Social History Narrative  . Not on file   Social Determinants of Health   Financial Resource Strain: Low Risk   . Difficulty of Paying Living Expenses: Not hard at all  Food Insecurity: No Food Insecurity  . Worried About Programme researcher, broadcasting/film/video in the Last Year: Never true  . Ran Out of Food in the Last Year: Never true  Transportation Needs: No Transportation Needs  . Lack of Transportation (Medical): No  . Lack of Transportation (Non-Medical): No  Physical Activity: Sufficiently Active  . Days of Exercise per Week: 6 days  . Minutes of Exercise per Session: 60 min  Stress: No Stress Concern Present  . Feeling of Stress : Not at all  Social Connections: Moderately Integrated  . Frequency of Communication with Friends and Family: More than three times a week  . Frequency of Social Gatherings with Friends and Family: Three times a week  . Attends Religious Services: Never  . Active Member of Clubs or Organizations: Yes  . Attends Banker Meetings: More than 4 times per year  . Marital Status: Married     Family History: The patient's family history includes Asthma in her sister; Breast cancer in her maternal aunt; Breast cancer (age of onset: 60) in her sister; Cancer in her sister; Cancer - Colon in her paternal grandmother; Coronary artery disease in her father; Diabetes in her mother; Heart attack in her brother and father; Hyperlipidemia in her sister; Hypertension in her sister; Scleroderma in her sister; Stroke in her mother; Thyroid disease in her sister.  ROS:   Please see the history of present illness.    All other systems reviewed and are negative.  EKGs/Labs/Other Studies Reviewed:    The following studies were reviewed today:  07/20/2020 Cardiac MRI FINDINGS: 1. Normal left ventricular size, mild proximal septal  wall hypertrophy. Normal systolic function (LVEF = 59%). There is Moderate basal-mid septal wall hypokinesis. There is a subendocardial late gadolinium enhancement in the basal and mid septal wall of the left ventricular myocardium. LGE appears to be at least 50% in the proximal/basal septum and 75-100% in the mid septal wall, indicating prior infarct. Left ventricular scar burden 19.7%.  LVEDV: 177 ml LVESV: 72 ml SV: 105 ml CO: 5.1 L/min Myocardial mass: 110 g  2. Normal right ventricular size, thickness and systolic function (RVEF =60%). There are no regional wall motion abnormalities. 3.  Normal left and right  atrial size. 4. Normal size of the aortic root, ascending aorta and pulmonary artery. 5.  No significant valvular abnormalities. 6.  Normal pericardium.  No pericardial effusion.    EKG:  The ekg ordered today demonstrates sinus bradycardia with a ventricular rate of 47 bpm.  Recent Labs: 05/29/2020: BUN 18; Creatinine, Ser 0.83; Potassium 4.6; Sodium 141  Recent Lipid Panel    Component Value Date/Time   CHOL 184 09/05/2018 1149   TRIG 77 09/05/2018 1149   HDL 89 09/05/2018 1149   CHOLHDL 2.1 09/05/2018 1149   LDLCALC 80 09/05/2018 1149    Physical Exam:    VS:  BP 120/68   Pulse (!) 47   Ht 5\' 7"  (1.702 m)   Wt 184 lb 6.4 oz (83.6 kg)   SpO2 97%   BMI 28.88 kg/m     Wt Readings from Last 3 Encounters:  07/28/20 184 lb 6.4 oz (83.6 kg)  05/29/20 184 lb (83.5 kg)  12/04/19 170 lb 14.4 oz (77.5 kg)     GEN:  Well nourished, well developed in no acute distress HEENT: Normal NECK: No JVD; No carotid bruits LYMPHATICS: No lymphadenopathy CARDIAC: RRR, no murmurs, rubs, gallops RESPIRATORY:  Clear to auscultation without rales, wheezing or rhonchi  ABDOMEN: Soft, non-tender, non-distended MUSCULOSKELETAL:  No edema; No deformity  SKIN: Warm and dry NEUROLOGIC:  Alert and oriented x 3 PSYCHIATRIC:  Normal affect   ASSESSMENT:    1.  Wide-complex tachycardia (HCC)   2. Coronary artery disease involving native coronary artery of native heart without angina pectoris   3. Hx of CABG    PLAN:    In order of problems listed above:  1. Wide-complex tachycardia Likely SVT with aberrancy although there is a large scar burden on her cardiac MRI putting her at risk for ventricular arrhythmias.  At this point, I would plan on performing EP study with possible ablation of SVT.  I will also perform a [place] protocol during the EP study to assess her risk for ventricular arrhythmias.  If the [place] protocol successfully induces VT/VF, would plan to implant ICD.  This would be done on a separate day after I had the chance to discuss this more with the patient and her husband.  Medical therapy options are limited given her resting bradycardia.  I discussed EP study and ablation in detail with the patient during today's appointment including the risks and expected recovery time and she wishes to proceed.  Therapeutic strategies for supraventricular tachycardia including medicine and ablation were discussed in detail with the patient today. Risk, benefits, and alternatives to EP study and radiofrequency ablation were also discussed in detail today. These risks include but are not limited to stroke, bleeding, vascular damage, tamponade, perforation, damage to the heart and other structures, AV block requiring pacemaker, worsening renal function, and death. The patient understands these risk and wishes to proceed.  We will therefore proceed with catheter ablation at the next available time.  2.  Coronary artery disease post CABG No ischemic symptoms today.   Total time spent with patient today 50 minutes. This includes reviewing records, evaluating the patient and coordinating care.   Medication Adjustments/Labs and Tests Ordered: Current medicines are reviewed at length with the patient today.  Concerns regarding medicines are outlined  above.  Orders Placed This Encounter  Procedures  . EKG 12-Lead   No orders of the defined types were placed in this encounter.    Signed, Steffanie Dunnameron Barnard Sharps, MD, [facility] Center[facility]ACC  07/28/2020  9:07 AM    Electrophysiology [facility]

## 2020-07-30 ENCOUNTER — Other Ambulatory Visit: Payer: Self-pay | Admitting: Family Medicine

## 2020-07-31 DIAGNOSIS — M9902 Segmental and somatic dysfunction of thoracic region: Secondary | ICD-10-CM | POA: Diagnosis not present

## 2020-07-31 DIAGNOSIS — M5414 Radiculopathy, thoracic region: Secondary | ICD-10-CM | POA: Diagnosis not present

## 2020-07-31 DIAGNOSIS — M542 Cervicalgia: Secondary | ICD-10-CM | POA: Diagnosis not present

## 2020-07-31 DIAGNOSIS — M9901 Segmental and somatic dysfunction of cervical region: Secondary | ICD-10-CM | POA: Diagnosis not present

## 2020-08-11 ENCOUNTER — Other Ambulatory Visit: Payer: Medicare HMO | Admitting: *Deleted

## 2020-08-11 ENCOUNTER — Other Ambulatory Visit: Payer: Self-pay

## 2020-08-11 DIAGNOSIS — Z951 Presence of aortocoronary bypass graft: Secondary | ICD-10-CM

## 2020-08-11 DIAGNOSIS — R000 Tachycardia, unspecified: Secondary | ICD-10-CM

## 2020-08-11 DIAGNOSIS — I2510 Atherosclerotic heart disease of native coronary artery without angina pectoris: Secondary | ICD-10-CM | POA: Diagnosis not present

## 2020-08-11 DIAGNOSIS — I472 Ventricular tachycardia: Secondary | ICD-10-CM | POA: Diagnosis not present

## 2020-08-11 LAB — BASIC METABOLIC PANEL
BUN/Creatinine Ratio: 15 (ref 12–28)
BUN: 13 mg/dL (ref 8–27)
CO2: 23 mmol/L (ref 20–29)
Calcium: 10.2 mg/dL (ref 8.7–10.3)
Chloride: 98 mmol/L (ref 96–106)
Creatinine, Ser: 0.85 mg/dL (ref 0.57–1.00)
Glucose: 122 mg/dL — ABNORMAL HIGH (ref 65–99)
Potassium: 4.2 mmol/L (ref 3.5–5.2)
Sodium: 139 mmol/L (ref 134–144)
eGFR: 72 mL/min/{1.73_m2} (ref >59)

## 2020-08-11 LAB — CBC WITH DIFFERENTIAL/PLATELET
Basophils Absolute: 0 10*3/uL (ref 0.0–0.2)
Basos: 0 %
EOS (ABSOLUTE): 0 10*3/uL (ref 0.0–0.4)
Eos: 1 %
Hematocrit: 41.6 % (ref 34.0–46.6)
Hemoglobin: 14 g/dL (ref 11.1–15.9)
Immature Grans (Abs): 0 10*3/uL (ref 0.0–0.1)
Immature Granulocytes: 0 %
Lymphocytes Absolute: 1.7 10*3/uL (ref 0.7–3.1)
Lymphs: 27 %
MCH: 32.5 pg (ref 26.6–33.0)
MCHC: 33.7 g/dL (ref 31.5–35.7)
MCV: 97 fL (ref 79–97)
Monocytes Absolute: 0.4 10*3/uL (ref 0.1–0.9)
Monocytes: 7 %
Neutrophils Absolute: 4.2 10*3/uL (ref 1.4–7.0)
Neutrophils: 65 %
Platelets: 198 10*3/uL (ref 150–450)
RBC: 4.31 x10E6/uL (ref 3.77–5.28)
RDW: 12 % (ref 11.7–15.4)
WBC: 6.4 10*3/uL (ref 3.4–10.8)

## 2020-08-14 DIAGNOSIS — M9901 Segmental and somatic dysfunction of cervical region: Secondary | ICD-10-CM | POA: Diagnosis not present

## 2020-08-14 DIAGNOSIS — M5414 Radiculopathy, thoracic region: Secondary | ICD-10-CM | POA: Diagnosis not present

## 2020-08-14 DIAGNOSIS — M9902 Segmental and somatic dysfunction of thoracic region: Secondary | ICD-10-CM | POA: Diagnosis not present

## 2020-08-14 DIAGNOSIS — M542 Cervicalgia: Secondary | ICD-10-CM | POA: Diagnosis not present

## 2020-09-02 ENCOUNTER — Other Ambulatory Visit (HOSPITAL_COMMUNITY)
Admission: RE | Admit: 2020-09-02 | Discharge: 2020-09-02 | Disposition: A | Discharge status: Home / Self Care | Payer: Medicare HMO | Source: Ambulatory Visit | Attending: Cardiology | Admitting: Cardiology

## 2020-09-02 ENCOUNTER — Telehealth: Payer: Self-pay

## 2020-09-02 DIAGNOSIS — Z01812 Encounter for preprocedural laboratory examination: Secondary | ICD-10-CM | POA: Insufficient documentation

## 2020-09-02 DIAGNOSIS — Z20822 Contact with and (suspected) exposure to covid-19: Secondary | ICD-10-CM | POA: Insufficient documentation

## 2020-09-02 LAB — SARS CORONAVIRUS 2 (TAT 6-24 HRS): SARS Coronavirus 2: NEGATIVE

## 2020-09-02 NOTE — Telephone Encounter (Signed)
Outreach made to Pt.  Advised arrival time for procedure 09/04/2020 has been changed to 11:30 am.  Pt indicates understanding.

## 2020-09-03 NOTE — Pre-Procedure Instructions (Signed)
Instructed patient on the following items: Arrival time 1130 Nothing to eat or drink after midnight No meds AM of procedure Responsible person to drive you home and stay with you for 24 hrs   

## 2020-09-04 ENCOUNTER — Other Ambulatory Visit: Payer: Self-pay

## 2020-09-04 ENCOUNTER — Ambulatory Visit (HOSPITAL_COMMUNITY): Payer: Medicare HMO | Admitting: Certified Registered Nurse Anesthetist

## 2020-09-04 ENCOUNTER — Ambulatory Visit (HOSPITAL_COMMUNITY)
Admission: RE | Admit: 2020-09-04 | Discharge: 2020-09-04 | Disposition: A | Discharge status: Home / Self Care | Payer: Medicare HMO | Source: Ambulatory Visit | Attending: Cardiology | Admitting: Cardiology

## 2020-09-04 ENCOUNTER — Ambulatory Visit (HOSPITAL_COMMUNITY)
Admission: RE | Disposition: A | Discharge status: Home / Self Care | Payer: Self-pay | Source: Ambulatory Visit | Attending: Cardiology

## 2020-09-04 DIAGNOSIS — Z79899 Other long term (current) drug therapy: Secondary | ICD-10-CM | POA: Diagnosis not present

## 2020-09-04 DIAGNOSIS — I2510 Atherosclerotic heart disease of native coronary artery without angina pectoris: Secondary | ICD-10-CM | POA: Diagnosis not present

## 2020-09-04 DIAGNOSIS — Z87891 Personal history of nicotine dependence: Secondary | ICD-10-CM | POA: Diagnosis not present

## 2020-09-04 DIAGNOSIS — Z888 Allergy status to other drugs, medicaments and biological substances status: Secondary | ICD-10-CM | POA: Diagnosis not present

## 2020-09-04 DIAGNOSIS — E039 Hypothyroidism, unspecified: Secondary | ICD-10-CM | POA: Diagnosis not present

## 2020-09-04 DIAGNOSIS — J449 Chronic obstructive pulmonary disease, unspecified: Secondary | ICD-10-CM | POA: Diagnosis not present

## 2020-09-04 DIAGNOSIS — E785 Hyperlipidemia, unspecified: Secondary | ICD-10-CM | POA: Diagnosis not present

## 2020-09-04 DIAGNOSIS — Z79890 Hormone replacement therapy (postmenopausal): Secondary | ICD-10-CM | POA: Insufficient documentation

## 2020-09-04 DIAGNOSIS — Z7982 Long term (current) use of aspirin: Secondary | ICD-10-CM | POA: Diagnosis not present

## 2020-09-04 DIAGNOSIS — Z951 Presence of aortocoronary bypass graft: Secondary | ICD-10-CM | POA: Insufficient documentation

## 2020-09-04 DIAGNOSIS — I484 Atypical atrial flutter: Secondary | ICD-10-CM | POA: Insufficient documentation

## 2020-09-04 DIAGNOSIS — Z881 Allergy status to other antibiotic agents status: Secondary | ICD-10-CM | POA: Diagnosis not present

## 2020-09-04 DIAGNOSIS — I471 Supraventricular tachycardia: Secondary | ICD-10-CM | POA: Insufficient documentation

## 2020-09-04 HISTORY — PX: SVT ABLATION: EP1225

## 2020-09-04 SURGERY — SVT ABLATION
Anesthesia: Monitor Anesthesia Care

## 2020-09-04 MED ORDER — APIXABAN 5 MG PO TABS: 5.0000 mg | ORAL_TABLET | Freq: Two times a day (BID) | ORAL | 5 refills | Status: DC

## 2020-09-04 MED ORDER — BUPIVACAINE HCL (PF) 0.25 % IJ SOLN
INTRAMUSCULAR | Status: DC | PRN
  Administered 2020-09-04: 20 mL

## 2020-09-04 MED ORDER — ONDANSETRON HCL 4 MG/2ML IJ SOLN: 4.0000 mg | Freq: Four times a day (QID) | INTRAMUSCULAR | Status: DC | PRN

## 2020-09-04 MED ORDER — HEPARIN (PORCINE) IN NACL 1000-0.9 UT/500ML-% IV SOLN
INTRAVENOUS | Status: DC | PRN
  Administered 2020-09-04: 500 mL

## 2020-09-04 MED ORDER — METOPROLOL TARTRATE 5 MG/5ML IV SOLN
INTRAVENOUS | Status: AC
  Filled 2020-09-04: qty 5

## 2020-09-04 MED ORDER — ACETAMINOPHEN 500 MG PO TABS
1000.0000 mg | ORAL_TABLET | Freq: Once | ORAL | Status: AC
  Administered 2020-09-04: 1000 mg via ORAL
  Filled 2020-09-04: qty 2

## 2020-09-04 MED ORDER — LIDOCAINE 2% (20 MG/ML) 5 ML SYRINGE
INTRAMUSCULAR | Status: DC | PRN
  Administered 2020-09-04: 40 mg via INTRAVENOUS

## 2020-09-04 MED ORDER — SODIUM CHLORIDE 0.9% FLUSH: 3.0000 mL | Freq: Two times a day (BID) | INTRAVENOUS | Status: DC

## 2020-09-04 MED ORDER — SODIUM CHLORIDE 0.9 % IV SOLN: INTRAVENOUS | Status: DC | PRN

## 2020-09-04 MED ORDER — HEPARIN (PORCINE) IN NACL 1000-0.9 UT/500ML-% IV SOLN
INTRAVENOUS | Status: AC
  Filled 2020-09-04: qty 1000

## 2020-09-04 MED ORDER — HEPARIN SODIUM (PORCINE) 1000 UNIT/ML IJ SOLN
INTRAMUSCULAR | Status: AC
  Filled 2020-09-04: qty 1

## 2020-09-04 MED ORDER — ACETAMINOPHEN 500 MG PO TABS
ORAL_TABLET | ORAL | Status: AC
  Filled 2020-09-04: qty 2

## 2020-09-04 MED ORDER — SODIUM CHLORIDE 0.9 % IV SOLN: INTRAVENOUS | Status: DC

## 2020-09-04 MED ORDER — PROPOFOL 500 MG/50ML IV EMUL
INTRAVENOUS | Status: DC | PRN
  Administered 2020-09-04: 75 ug/kg/min via INTRAVENOUS

## 2020-09-04 MED ORDER — ISOPROTERENOL HCL 0.2 MG/ML IJ SOLN
INTRAVENOUS | Status: DC | PRN
  Administered 2020-09-04: 2 ug/min via INTRAVENOUS

## 2020-09-04 MED ORDER — APIXABAN 5 MG PO TABS: 5.0000 mg | ORAL_TABLET | Freq: Two times a day (BID) | ORAL | Status: DC

## 2020-09-04 MED ORDER — SODIUM CHLORIDE 0.9% FLUSH: 3.0000 mL | INTRAVENOUS | Status: DC | PRN

## 2020-09-04 MED ORDER — SODIUM CHLORIDE 0.9 % IV SOLN: 250.0000 mL | INTRAVENOUS | Status: DC | PRN

## 2020-09-04 MED ORDER — METOPROLOL TARTRATE 5 MG/5ML IV SOLN
INTRAVENOUS | Status: DC | PRN
  Administered 2020-09-04 (×2): 2.5 mg via INTRAVENOUS

## 2020-09-04 MED ORDER — BUPIVACAINE HCL (PF) 0.25 % IJ SOLN
INTRAMUSCULAR | Status: AC
  Filled 2020-09-04: qty 30

## 2020-09-04 MED ORDER — ISOPROTERENOL HCL 0.2 MG/ML IJ SOLN
INTRAMUSCULAR | Status: AC
  Filled 2020-09-04: qty 5

## 2020-09-04 MED ORDER — PHENYLEPHRINE HCL-NACL 10-0.9 MG/250ML-% IV SOLN
INTRAVENOUS | Status: DC | PRN
  Administered 2020-09-04: 15 ug/min via INTRAVENOUS

## 2020-09-04 MED ORDER — PHENYLEPHRINE HCL (PRESSORS) 10 MG/ML IV SOLN
INTRAVENOUS | Status: DC | PRN
  Administered 2020-09-04 (×2): 80 ug via INTRAVENOUS

## 2020-09-04 MED ORDER — MIDAZOLAM HCL 5 MG/5ML IJ SOLN
INTRAMUSCULAR | Status: DC | PRN
  Administered 2020-09-04 (×2): .5 mg via INTRAVENOUS

## 2020-09-04 MED ORDER — PROPOFOL 10 MG/ML IV BOLUS
INTRAVENOUS | Status: DC | PRN
  Administered 2020-09-04: 10 mg via INTRAVENOUS
  Administered 2020-09-04: 20 mg via INTRAVENOUS
  Administered 2020-09-04: 40 mg via INTRAVENOUS
  Administered 2020-09-04: 20 mg via INTRAVENOUS

## 2020-09-04 MED ORDER — ACETAMINOPHEN 325 MG PO TABS: 650.0000 mg | ORAL_TABLET | ORAL | Status: DC | PRN

## 2020-09-04 SURGICAL SUPPLY — 12 items
CATH CRD2 QUAD 6FR 5 (CATHETERS) ×1 IMPLANT
CATH JOSEPH QUAD ALLRED 6F REP (CATHETERS) ×1 IMPLANT
CATH WEBSTER BI DIR CS D-F CRV (CATHETERS) ×1 IMPLANT
CLOSURE PERCLOSE PROSTYLE (VASCULAR PRODUCTS) ×3 IMPLANT
PACK EP LATEX FREE (CUSTOM PROCEDURE TRAY) ×2
PACK EP LF (CUSTOM PROCEDURE TRAY) ×1 IMPLANT
PAD PRO RADIOLUCENT 2001M-C (PAD) ×2 IMPLANT
PATCH CARTO3 (PAD) ×1 IMPLANT
SHEATH PINNACLE 7F 10CM (SHEATH) ×1 IMPLANT
SHEATH PINNACLE 8F 10CM (SHEATH) ×2 IMPLANT
SHEATH PROBE COVER 6X72 (BAG) ×1 IMPLANT
TUBING SMART ABLATE COOLFLOW (TUBING) ×1 IMPLANT

## 2020-09-04 NOTE — Anesthesia Postprocedure Evaluation (Signed)
Anesthesia Post Note  Patient: Penny Reed  Procedure(s) Performed: SVT ABLATION (N/A )     Patient location during evaluation: Short Stay Anesthesia Type: General Level of consciousness: awake and alert, patient cooperative and oriented Pain management: pain level controlled Vital Signs Assessment: post-procedure vital signs reviewed and stable Respiratory status: spontaneous breathing, nonlabored ventilation and respiratory function stable Cardiovascular status: blood pressure returned to baseline and stable Postop Assessment: no apparent nausea or vomiting Anesthetic complications: no   No complications documented.  Last Vitals:  Vitals:   09/04/20 1540 09/04/20 1545  BP: (!) 141/57 (!) 154/47  Pulse: (!) 52 (!) 51  Resp: 13 13  Temp:  36.4 C  SpO2: 97% 96%    Last Pain:  Vitals:   09/04/20 1545  TempSrc: Temporal  PainSc:                  Masayo Fera,E. Lauri Purdum

## 2020-09-04 NOTE — H&P (Signed)
Penny Reed Electrophysiology Office Follow up Visit Note:    Date:  07/28/2020   ID:  Penny Reed, DOB Sep 03, 1945, MRN [id number]  PCP:  Maple Hudson., MD         [facility] Cardiologist:  No primary care provider on file.  [facility] Electrophysiologist:  Lanier Prude, MD    Interval History:    Penny Reed is a 75 y.o. female who presents for a follow up visit. They were last seen in clinic May 29, 2020 for palpitations.  At that appointment, I felt like her symptoms were related to an SVT with aberrant conduction.  We ordered a cardiac MRI at the last appointment to assess the burden of scar.  We planned to eventually perform an EP study with possible ablation to further investigate her episodes of palpitations.  She saw Dr. Juliann Pares in follow-up July 15, 2020.  She continues to have symptoms but none that have lasted for prolonged amount of time.  She is with her husband today in clinic.         Past Medical History:  Diagnosis Date  . Allergic rhinitis   . CAD (coronary artery disease)   . Complication of anesthesia    woke up during colonoscopy  . Hyperlipidemia   . Hypertension   . Hypothyroidism   . LVH (left ventricular hypertrophy)          Past Surgical History:  Procedure Laterality Date  . ABDOMINAL HYSTERECTOMY     has left ovary  . APPENDECTOMY    . CARDIAC CATHETERIZATION  09/2005   had 1 stent and 3 balloons placed  . COLONOSCOPY WITH PROPOFOL N/A 12/04/2019   Procedure: COLONOSCOPY WITH PROPOFOL;  Surgeon: Earline Mayotte, MD;  Location: [facility] ENDOSCOPY;  Service: Endoscopy;  Laterality: N/A;  . CORONARY ARTERY BYPASS GRAFT    . TONSILLECTOMY      Current Medications: Active Medications      Current Meds  Medication Sig  . albuterol (PROAIR HFA) 108 (90 BASE) MCG/ACT inhaler Inhale 2 puffs into the lungs every 6 (six) hours as needed for wheezing or shortness of breath.  Penny Reed  amLODipine (NORVASC) 5 MG tablet TAKE 1 TABLET DAILY  . ANORO ELLIPTA 62.5-25 MCG/INH AEPB INHALE 1 PUFF BY MOUTH EVERY DAY  . aspirin 81 MG tablet Take 81 mg by mouth daily.  Penny Reed atorvastatin (LIPITOR) 80 MG tablet Take 1 tablet (80 mg total) by mouth daily.  Penny Reed azelastine (ASTELIN) 0.1 % nasal spray SPRAY 2 SPRAYS INTO EACH NOSTRIL EVERY DAY  . Calcium Carbonate (CALCIUM 600 PO) Take 1 tablet by mouth 2 (two) times a week.   . cholecalciferol (VITAMIN D3) 25 MCG (1000 UNIT) tablet Take 5,000 Units by mouth daily.   Penny Reed levothyroxine (SYNTHROID) 112 MCG tablet TAKE ONE TABLET BY MOUTH SIX DAYS PER WEEK. TAKE 1 AND 1/2 TABLET BY MOUTH ONE DAY PER WEEK.  Penny Reed loratadine (CLARITIN) 10 MG tablet Take 10 mg by mouth daily.  . Multiple Vitamin (MULTIVITAMIN) tablet Take 1 tablet by mouth daily. Standard Process  . nitroGLYCERIN (NITROSTAT) 0.4 MG SL tablet Place 0.4 mg under the tongue every 5 (five) minutes as needed for chest pain.   . Omega-3 Fatty Acids (FISH OIL) 1000 MG CAPS Take 3 capsules by mouth daily.   . psyllium (REGULOID) 0.52 g capsule Take 2 g by mouth daily. Fiber capsules  . quinapril (ACCUPRIL) 40 MG tablet Take 1 tablet (40 mg total) by mouth  daily.  . zolpidem (AMBIEN) 5 MG tablet TAKE 1 TABLET (5 MG TOTAL) BY MOUTH AT BEDTIME AS NEEDED FOR SLEEP.       Allergies:   Adhesive [tape] and Azithromycin   Social History        Socioeconomic History  . Marital status: Married    Spouse name: Not on file  . Number of children: 3  . Years of education: Not on file  . Highest education level: Associate degree: occupational, Scientist, product/process development, or vocational program  Occupational History  . Not on file  Tobacco Use  . Smoking status: Former Smoker    Packs/day: 0.25    Years: 25.00    Pack years: 6.25    Types: Cigarettes    Quit date: 04/25/2005    Years since quitting: 15.2  . Smokeless tobacco: Never Used  Vaping Use  . Vaping Use: Never used  Substance and Sexual  Activity  . Alcohol use: Yes    Alcohol/week: 5.0 standard drinks    Types: 5 Glasses of wine per week    Comment: spread out between 2-3 nights a week  . Drug use: No  . Sexual activity: Not on file  Other Topics Concern  . Not on file  Social History Narrative  . Not on file   Social Determinants of Health      Financial Resource Strain: Low Risk   . Difficulty of Paying Living Expenses: Not hard at all  Food Insecurity: No Food Insecurity  . Worried About Programme researcher, broadcasting/film/video in the Last Year: Never true  . Ran Out of Food in the Last Year: Never true  Transportation Needs: No Transportation Needs  . Lack of Transportation (Medical): No  . Lack of Transportation (Non-Medical): No  Physical Activity: Sufficiently Active  . Days of Exercise per Week: 6 days  . Minutes of Exercise per Session: 60 min  Stress: No Stress Concern Present  . Feeling of Stress : Not at all  Social Connections: Moderately Integrated  . Frequency of Communication with Friends and Family: More than three times a week  . Frequency of Social Gatherings with Friends and Family: Three times a week  . Attends Religious Services: Never  . Active Member of Clubs or Organizations: Yes  . Attends Banker Meetings: More than 4 times per year  . Marital Status: Married     Family History: The patient's family history includes Asthma in her sister; Breast cancer in her maternal aunt; Breast cancer (age of onset: 40) in her sister; Cancer in her sister; Cancer - Colon in her paternal grandmother; Coronary artery disease in her father; Diabetes in her mother; Heart attack in her brother and father; Hyperlipidemia in her sister; Hypertension in her sister; Scleroderma in her sister; Stroke in her mother; Thyroid disease in her sister.  ROS:   Please see the history of present illness.    All other systems reviewed and are negative.  EKGs/Labs/Other Studies Reviewed:    The following  studies were reviewed today:  07/20/2020 Cardiac MRI FINDINGS: 1. Normal left ventricular size, mild proximal septal wall hypertrophy. Normal systolic function (LVEF = 59%). There is Moderate basal-mid septal wall hypokinesis. There is a subendocardial late gadolinium enhancement in the basal and mid septal wall of the left ventricular myocardium. LGE appears to be at least 50% in the proximal/basal septum and 75-100% in the mid septal wall, indicating prior infarct. Left ventricular scar burden 19.7%.  LVEDV: 177 ml LVESV: 72  ml SV: 105 ml CO: 5.1 L/min Myocardial mass: 110 g  2. Normal right ventricular size, thickness and systolic function (RVEF =60%). There are no regional wall motion abnormalities. 3. Normal left and right atrial size. 4. Normal size of the aortic root, ascending aorta and pulmonary artery. 5. No significant valvular abnormalities. 6. Normal pericardium. No pericardial effusion.    EKG:  The ekg ordered today demonstrates sinus bradycardia with a ventricular rate of 47 bpm.  Recent Labs: 05/29/2020: BUN 18; Creatinine, Ser 0.83; Potassium 4.6; Sodium 141  Recent Lipid Panel Labs (Brief)          Component Value Date/Time   CHOL 184 09/05/2018 1149   TRIG 77 09/05/2018 1149   HDL 89 09/05/2018 1149   CHOLHDL 2.1 09/05/2018 1149   LDLCALC 80 09/05/2018 1149      Physical Exam:    VS:  BP 120/68   Pulse (!) 47   Ht 5\' 7"  (1.702 m)   Wt 184 lb 6.4 oz (83.6 kg)   SpO2 97%   BMI 28.88 kg/m        Wt Readings from Last 3 Encounters:  07/28/20 184 lb 6.4 oz (83.6 kg)  05/29/20 184 lb (83.5 kg)  12/04/19 170 lb 14.4 oz (77.5 kg)     GEN:  Well nourished, well developed in no acute distress HEENT: Normal NECK: No JVD; No carotid bruits LYMPHATICS: No lymphadenopathy CARDIAC: RRR, no murmurs, rubs, gallops RESPIRATORY:  Clear to auscultation without rales, wheezing or rhonchi  ABDOMEN: Soft, non-tender,  non-distended MUSCULOSKELETAL:  No edema; No deformity  SKIN: Warm and dry NEUROLOGIC:  Alert and oriented x 3 PSYCHIATRIC:  Normal affect   ASSESSMENT:    1. Wide-complex tachycardia (HCC)   2. Coronary artery disease involving native coronary artery of native heart without angina pectoris   3. Hx of CABG    PLAN:    In order of problems listed above:  1. Wide-complex tachycardia Likely SVT with aberrancy although there is a large scar burden on her cardiac MRI putting her at risk for ventricular arrhythmias.  At this point, I would plan on performing EP study with possible ablation of SVT.  I will also perform a [place] protocol during the EP study to assess her risk for ventricular arrhythmias.  If the [place] protocol successfully induces VT/VF, would plan to implant ICD.  This would be done on a separate day after I had the chance to discuss this more with the patient and her husband.  Medical therapy options are limited given her resting bradycardia.  I discussed EP study and ablation in detail with the patient during today's appointment including the risks and expected recovery time and she wishes to proceed.  Therapeutic strategies for supraventricular tachycardia including medicine and ablation were discussed in detail with the patient today. Risk, benefits, and alternatives to EP study and radiofrequency ablation were also discussed in detail today. These risks include but are not limited to stroke, bleeding, vascular damage, tamponade, perforation, damage to the heart and other structures, AV block requiring pacemaker, worsening renal function, and death. The patient understands these risk and wishes to proceed.  We will therefore proceed with catheter ablation at the next available time.  2.  Coronary artery disease post CABG No ischemic symptoms today.       ----------------------------------------------------------------------------  I have seen, examined  the patient, and reviewed the above assessment and plan.    Plan for EP study and possible ablation today.  Lanier PrudeAMERON T Troyce Gieske, MD 09/04/2020 12:56 PM

## 2020-09-04 NOTE — Discharge Instructions (Signed)
Post procedure care instructions No driving for 4 days. No lifting over 5 lbs for 1 week. No vigorous or sexual activity for 1 week. You may return to work/your usual activities on 09/12/20. Keep procedure site clean & dry. If you notice increased pain, swelling, bleeding or pus, call/return!  You may shower after 24 hours, but no soaking in baths/hot tubs/pools for 1 week.     Femoral Site Care  This sheet gives you information about how to care for yourself after your procedure. Your health care provider may also give you more specific instructions. If you have problems or questions, contact your health care provider. What can I expect after the procedure? After the procedure, it is common to have:  Bruising that usually fades within 1-2 weeks.  Tenderness at the site. Follow these instructions at home: Wound care  Follow instructions from your health care provider about how to take care of your insertion site. Make sure you: ? Wash your hands with soap and water before you change your bandage (dressing). If soap and water are not available, use hand sanitizer. ? Change your dressing as told by your health care provider. ? Leave stitches (sutures), skin glue, or adhesive strips in place. These skin closures may need to stay in place for 2 weeks or longer. If adhesive strip edges start to loosen and curl up, you may trim the loose edges. Do not remove adhesive strips completely unless your health care provider tells you to do that.  Do not take baths, swim, or use a hot tub until your health care provider approves.  You may shower 24-48 hours after the procedure or as told by your health care provider. ? Gently wash the site with plain soap and water. ? Pat the area dry with a clean towel. ? Do not rub the site. This may cause bleeding.  Do not apply powder or lotion to the site. Keep the site clean and dry.  Check your femoral site every day for signs of infection. Check  for: ? Redness, swelling, or pain. ? Fluid or blood. ? Warmth. ? Pus or a bad smell. Activity  For the first 2-3 days after your procedure, or as long as directed: ? Avoid climbing stairs as much as possible. ? Do not squat.  Do not lift anything that is heavier than 10 lb (4.5 kg), or the limit that you are told, until your health care provider says that it is safe.  Rest as directed. ? Avoid sitting for a long time without moving. Get up to take short walks every 1-2 hours.  Do not drive for 24 hours if you were given a medicine to help you relax (sedative). General instructions  Take over-the-counter and prescription medicines only as told by your health care provider.  Keep all follow-up visits as told by your health care provider. This is important. Contact a health care provider if you have:  A fever or chills.  You have redness, swelling, or pain around your insertion site. Get help right away if:  The catheter insertion area swells very fast.  You pass out.  You suddenly start to sweat or your skin gets clammy.  The catheter insertion area is bleeding, and the bleeding does not stop when you hold steady pressure on the area.  The area near or just beyond the catheter insertion site becomes pale, cool, tingly, or numb. These symptoms may represent a serious problem that is an emergency. Do not wait to  see if the symptoms will go away. Get medical help right away. Call your local emergency services (911 in the [place] Do not drive yourself to the hospital. Summary  After the procedure, it is common to have bruising that usually fades within 1-2 weeks.  Check your femoral site every day for signs of infection.  Do not lift anything that is heavier than 10 lb (4.5 kg), or the limit that you are told, until your health care provider says that it is safe. This information is not intended to replace advice given to you by your health care provider. Make sure you discuss  any questions you have with your health care provider. Document Revised: 12/13/2019 Document Reviewed: 12/13/2019 Elsevier Patient Education  2021 [employer].

## 2020-09-04 NOTE — Progress Notes (Signed)
Up and walked and tolerated well; right groin stable, no bleeding or hematoma 

## 2020-09-04 NOTE — Anesthesia Preprocedure Evaluation (Addendum)
Anesthesia Evaluation  Patient identified by MRN, date of birth, ID band Patient awake    Reviewed: Allergy & Precautions, NPO status , Patient's Chart, lab work & pertinent test results  History of Anesthesia Complications Negative for: history of anesthetic complications  Airway Mallampati: II  TM Distance: >3 FB Neck ROM: Full    Dental  (+) Dental Advisory Given, Teeth Intact   Pulmonary COPD,  COPD inhaler, former smoker,  09/02/2020 SARS coronavirus NEG   breath sounds clear to auscultation       Cardiovascular hypertension, Pt. on medications + CAD, + Past MI and + Cardiac Stents  + dysrhythmias Supra Ventricular Tachycardia  Rhythm:Regular Rate:Normal  07/20/2020 Cardiac MRI FINDINGS: 1. Normal left ventricular size, mild proximal septal wall hypertrophy. Normal systolic function (LVEF = 59%). There is Moderate basal-mid septal wall hypokinesis. There is a subendocardial late gadolinium enhancement in the basal and mid septal wall of the left ventricular myocardium. LVESV: 72 ml 2. Normal right ventricular size, thickness and systolic function (RVEF =32%). There are no regional wall motion abnormalities. 3. Normal left and right atrial size. 4. Normal size of the aortic root, ascending aorta and pulmonaryartery. 5. No significant valvular abnormalities. 6. Normal pericardium. No pericardial effusion.     Neuro/Psych negative neurological ROS     GI/Hepatic Neg liver ROS, GERD  Controlled,  Endo/Other  Hypothyroidism   Renal/GU negative Renal ROS     Musculoskeletal   Abdominal   Peds  Hematology negative hematology ROS (+)   Anesthesia Other Findings   Reproductive/Obstetrics                            Anesthesia Physical Anesthesia Plan  ASA: III  Anesthesia Plan: MAC   Post-op Pain Management:    Induction:   PONV Risk Score and Plan: 2 and Ondansetron,  Dexamethasone and Treatment may vary due to age or medical condition  Airway Management Planned: Natural Airway and Simple Face Mask  Additional Equipment: None  Intra-op Plan:   Post-operative Plan:   Informed Consent: I have reviewed the patients History and Physical, chart, labs and discussed the procedure including the risks, benefits and alternatives for the proposed anesthesia with the patient or authorized representative who has indicated his/her understanding and acceptance.     Dental advisory given  Plan Discussed with: CRNA and Surgeon  Anesthesia Plan Comments: (Dr. Quentin Ore prefers MAC for this patient)       Anesthesia Quick Evaluation

## 2020-09-04 NOTE — Transfer of Care (Signed)
Immediate Anesthesia Transfer of Care Note  Patient: Penny Reed  Procedure(s) Performed: SVT ABLATION (N/A )  Patient Location: Cath Lab  Anesthesia Type:MAC  Level of Consciousness: awake and patient cooperative  Airway & Oxygen Therapy: Patient Spontanous Breathing  Post-op Assessment: Report given to RN and Post -op Vital signs reviewed and stable  Post vital signs: Reviewed  Last Vitals:  Vitals Value Taken Time  BP 149/40 09/04/20 1513  Temp    Pulse 54 09/04/20 1514  Resp 15 09/04/20 1514  SpO2 98 % 09/04/20 1514  Vitals shown include unvalidated device data.  Last Pain:  Vitals:   09/04/20 1207  TempSrc: Oral  PainSc:       Patients Stated Pain Goal: 3 (92/42/68 [id number])  Complications: No complications documented.

## 2020-09-07 ENCOUNTER — Encounter (HOSPITAL_COMMUNITY): Payer: Self-pay | Admitting: Cardiology

## 2020-09-07 MED FILL — Heparin Sodium (Porcine) Inj 1000 Unit/ML: INTRAMUSCULAR | Qty: 10 | Status: AC

## 2020-09-09 ENCOUNTER — Ambulatory Visit: Payer: Medicare HMO | Admitting: Cardiology

## 2020-09-11 ENCOUNTER — Telehealth: Payer: Self-pay | Admitting: Cardiology

## 2020-09-11 MED ORDER — RIVAROXABAN 20 MG PO TABS: 20.0000 mg | ORAL_TABLET | Freq: Every day | ORAL | 3 refills | Status: DC

## 2020-09-11 NOTE — Telephone Encounter (Signed)
New message   Pt c/o medication issue:  1. Name of Medication: eliquis  2. How are you currently taking this medication (dosage and times per day)? 5mg  bid  3. Are you having a reaction (difficulty breathing--STAT)?  Pt thinks she is having a reaction but not an emergency  4. What is your medication issue? Patient states that she recently had an ablation.  She states that since starting the eliquis, she has noticed that her feet and lips are swollen and she has sores in her mouth.  Patient did not take medication last night and this am.  Please advise

## 2020-09-11 NOTE — Telephone Encounter (Signed)
Pt reports lip & pedal edema since Monday or before.  She also reports sores in her mouth. Pt has not started any new medications, besides Eliquis Denies tongue or airway swelling. Discussed w/ Mardelle Matte, PA at hospital -- he found that less than 1% of pts may develop anaphylaxis including allergic edema and recommended discussing further w/ Dr. Lalla Brothers. Discussed w/ Dr. Lalla Brothers who recommends pt switching to Xarelto. Pt advised and agreeable to plan. Advised to call office if symptoms don't improve after medication change (if worsen she was advised to call 911). Xarelto information given to pt to text and receive 30 day free via her phone -- pt agreeable. Advised to let office know if cost issue. Advised to take Xarelto at dinner time and rationale given to pt.

## 2020-09-14 NOTE — Telephone Encounter (Signed)
    Pt is calling, she asked if Sherri can e-mail her the coupon for her meds. She gave e-mail address: [email or web address]

## 2020-09-14 NOTE — Telephone Encounter (Signed)
Attempted phone call to pt.  Left voicemail message to contact RN at [phone]. 

## 2020-09-15 NOTE — Telephone Encounter (Signed)
Emailed 30 day free card as requested.  No further action needed.

## 2020-09-16 ENCOUNTER — Ambulatory Visit: Payer: Medicare HMO | Admitting: Cardiology

## 2020-09-16 ENCOUNTER — Encounter: Payer: Self-pay | Admitting: Cardiology

## 2020-09-16 ENCOUNTER — Ambulatory Visit (INDEPENDENT_AMBULATORY_CARE_PROVIDER_SITE_OTHER): Payer: Medicare HMO | Admitting: Family Medicine

## 2020-09-16 ENCOUNTER — Other Ambulatory Visit: Payer: Self-pay

## 2020-09-16 ENCOUNTER — Encounter: Payer: Self-pay | Admitting: Family Medicine

## 2020-09-16 VITALS — BP 138/62 | HR 46 | Temp 98.3°F | Resp 16 | Wt 184.0 lb

## 2020-09-16 VITALS — BP 160/60 | HR 48 | Ht 67.0 in | Wt 184.0 lb

## 2020-09-16 DIAGNOSIS — I484 Atypical atrial flutter: Secondary | ICD-10-CM

## 2020-09-16 DIAGNOSIS — I2510 Atherosclerotic heart disease of native coronary artery without angina pectoris: Secondary | ICD-10-CM

## 2020-09-16 DIAGNOSIS — I10 Essential (primary) hypertension: Secondary | ICD-10-CM

## 2020-09-16 DIAGNOSIS — Z951 Presence of aortocoronary bypass graft: Secondary | ICD-10-CM

## 2020-09-16 DIAGNOSIS — I483 Typical atrial flutter: Secondary | ICD-10-CM | POA: Diagnosis not present

## 2020-09-16 DIAGNOSIS — E7800 Pure hypercholesterolemia, unspecified: Secondary | ICD-10-CM

## 2020-09-16 DIAGNOSIS — Z0000 Encounter for general adult medical examination without abnormal findings: Secondary | ICD-10-CM

## 2020-09-16 NOTE — Progress Notes (Signed)
Complete physical exam   Patient: Penny Reed   DOB: [birth year]   75 y.o. Female  MRN: [id number] Visit Date: 09/16/2020  Today's healthcare provider: Megan Mansichard Karver Fadden Jr, MD   No chief complaint on file.  Subjective    Penny Reed is a 75 y.o. female who presents today for a complete physical exam.  She reports consuming a general diet. The patient does not participate in regular exercise at present. She generally feels fairly well. She reports sleeping well. She does not have additional problems to discuss today.  Patient had allergic reaction to Eliquis with some lip swelling and is now on Xarelto for her A. Fib. He is status post CABG 2012.  Past Medical History:  Diagnosis Date  . Allergic rhinitis   . CAD (coronary artery disease)   . Complication of anesthesia    woke up during colonoscopy  . Hyperlipidemia   . Hypertension   . Hypothyroidism   . LVH (left ventricular hypertrophy)    Past Surgical History:  Procedure Laterality Date  . ABDOMINAL HYSTERECTOMY     has left ovary  . APPENDECTOMY    . CARDIAC CATHETERIZATION  09/2005   had 1 stent and 3 balloons placed  . COLONOSCOPY WITH PROPOFOL N/A 12/04/2019   Procedure: COLONOSCOPY WITH PROPOFOL;  Surgeon: Earline MayotteByrnett, Jeffrey W, MD;  Location: [facility] ENDOSCOPY;  Service: Endoscopy;  Laterality: N/A;  . CORONARY ARTERY BYPASS GRAFT    . SVT ABLATION N/A 09/04/2020   Procedure: SVT ABLATION;  Surgeon: Lanier PrudeLambert, Cameron T, MD;  [facility];  Service: Cardiovascular;  Laterality: N/A;  . TONSILLECTOMY     Social History   Socioeconomic History  . Marital status: Married    Spouse name: Not on file  . Number of children: 3  . Years of education: Not on file  . Highest education level: Associate degree: occupational, Scientist, product/process developmenttechnical, or vocational program  Occupational History  . Not on file  Tobacco Use  . Smoking status: Former Smoker    Packs/day: 0.25    Years: 25.00    Pack years: 6.25     Types: Cigarettes    Quit date: 04/25/2005    Years since quitting: 15.4  . Smokeless tobacco: Never Used  Vaping Use  . Vaping Use: Never used  Substance and Sexual Activity  . Alcohol use: Yes    Alcohol/week: 5.0 standard drinks    Types: 5 Glasses of wine per week    Comment: spread out between 2-3 nights a week  . Drug use: No  . Sexual activity: Not on file  Other Topics Concern  . Not on file  Social History Narrative  . Not on file   Social Determinants of Health   Financial Resource Strain: Low Risk   . Difficulty of Paying Living Expenses: Not hard at all  Food Insecurity: Not on file  Transportation Needs: No Transportation Needs  . Lack of Transportation (Medical): No  . Lack of Transportation (Non-Medical): No  Physical Activity: Not on file  Stress: Not on file  Social Connections: Not on file  Intimate Partner Violence: Not on file   Family Status  Relation Name Status  . Mother  Deceased  . Father  Deceased at age 73       from heart disease  . Sister  Alive  . Brother  Deceased at age 73       from MI  . Sister  Alive  .  PGM  (Not Specified)  . Mat Aunt  (Not Specified)   Family History  Problem Relation Age of Onset  . Diabetes Mother   . Stroke Mother        cause of death  . Coronary artery disease Father   . Heart attack Father   . Hyperlipidemia Sister   . Hypertension Sister   . Breast cancer Sister 73  . Cancer Sister        breast cancer  . Asthma Sister   . Scleroderma Sister   . Thyroid disease Sister   . Heart attack Brother   . Cancer - Colon Paternal Grandmother   . Breast cancer Maternal Aunt        40s   Allergies  Allergen Reactions  . Adhesive [Tape]     Band-aids irritate skin  . Azithromycin Diarrhea and Nausea And Vomiting    Patient Care Team: Maple Hudson., MD as PCP - General (Family Medicine) Lanier Prude, MD as PCP - Electrophysiology (Cardiology) Domingo Madeira, OD as Consulting  Physician (Optometry) Alwyn Pea, MD as Consulting Physician (Cardiology) Tedd Sias Marlana Salvage, MD as Physician Assistant (Endocrinology) Lawrence Marseilles, DMD (Dentistry) Gaspar Cola, [facility] (Pharmacist)   Medications: Outpatient Medications Prior to Visit  Medication Sig  . Calcium Carbonate (CALCIUM 600 PO) Take 600 mg by mouth in the morning and at bedtime.  . cetirizine (ZYRTEC) 10 MG tablet Take 10 mg by mouth daily.  . cholecalciferol (VITAMIN D3) 25 MCG (1000 UNIT) tablet Take 1,000 Units by mouth daily.  . furosemide (LASIX) 40 MG tablet Take 20 mg by mouth daily as needed for edema.  . Multiple Vitamin (MULTIVITAMIN) tablet Take 1 tablet by mouth daily. Standard Process  . nitroGLYCERIN (NITROSTAT) 0.4 MG SL tablet Place 0.4 mg under the tongue every 5 (five) minutes as needed for chest pain.   . Omega-3 Fatty Acids (FISH OIL) 1000 MG CAPS Take 3,000 mg by mouth daily.  . psyllium (REGULOID) 0.52 g capsule Take 2.6 g by mouth daily. 5 Fiber capsules  . quinapril (ACCUPRIL) 40 MG tablet Take 1 tablet (40 mg total) by mouth daily.  . rivaroxaban (XARELTO) 20 MG TABS tablet Take 1 tablet (20 mg total) by mouth daily with supper.  . zolpidem (AMBIEN) 5 MG tablet TAKE 1 TABLET (5 MG TOTAL) BY MOUTH AT BEDTIME AS NEEDED FOR SLEEP.  [name] albuterol (PROAIR HFA) 108 (90 BASE) MCG/ACT inhaler Inhale 2 puffs into the lungs every 6 (six) hours as needed for wheezing or shortness of breath.  [name] amLODipine (NORVASC) 5 MG tablet TAKE 1 TABLET DAILY (Patient taking differently: Take 5 mg by mouth daily. TAKE 1 TABLET DAILY)  . ANORO ELLIPTA 62.5-25 MCG/INH AEPB INHALE 1 PUFF BY MOUTH EVERY DAY (Patient taking differently: Inhale 1 puff into the lungs daily.)  . atorvastatin (LIPITOR) 80 MG tablet Take 1 tablet (80 mg total) by mouth daily.  [name] azelastine (ASTELIN) 0.1 % nasal spray SPRAY 2 SPRAYS INTO EACH NOSTRIL EVERY DAY (Patient taking differently: Place 2 sprays into both nostrils at  bedtime. SPRAY 2 SPRAYS INTO EACH NOSTRIL EVERY DAY)  . levothyroxine (SYNTHROID) 112 MCG tablet TAKE ONE TABLET BY MOUTH SIX DAYS PER WEEK. TAKE 1 AND 1/2 TABLET BY MOUTH ONE DAY PER WEEK. (Patient taking differently: Take 112-168 mcg by mouth See admin instructions. TAKE ONE TABLET (112 mcg) BY MOUTH SIX DAYS PER WEEK. TAKE 1 AND 1/2 TABLET (168 mcg) BY MOUTH ONE  DAY PER WEEK.)   No facility-administered medications prior to visit.    Review of Systems  All other systems reviewed and are negative.      Objective    Temp 98.3 F (36.8 C)   Resp 16   Wt 184 lb (83.5 kg)   BMI 28.82 kg/m  BP Readings from Last 3 Encounters:  09/16/20 (!) 160/60  09/16/20 138/62  09/04/20 (!) 164/52   Wt Readings from Last 3 Encounters:  09/16/20 184 lb (83.5 kg)  09/16/20 184 lb (83.5 kg)  09/04/20 180 lb (81.6 kg)      Physical Exam Vitals reviewed.  Constitutional:      Appearance: She is well-developed.  HENT:     Head: Normocephalic and atraumatic.  Eyes:     General: No scleral icterus.    Conjunctiva/sclera: Conjunctivae normal.  Neck:     Thyroid: No thyromegaly.  Cardiovascular:     Rate and Rhythm: Normal rate and regular rhythm.     Heart sounds: Normal heart sounds.  Pulmonary:     Effort: Pulmonary effort is normal.     Breath sounds: Normal breath sounds.  Abdominal:     Palpations: Abdomen is soft.  Skin:    General: Skin is warm and dry.  Neurological:     General: No focal deficit present.     Mental Status: She is alert and oriented to person, place, and time.  Psychiatric:        Mood and Affect: Mood normal.        Behavior: Behavior normal.        Thought Content: Thought content normal.        Judgment: Judgment normal.       Last depression screening scores PHQ 2/9 Scores 09/16/2020 09/04/2019 09/05/2018  PHQ - 2 Score 0 0 0  PHQ- 9 Score 0 - 1   Last fall risk screening Fall Risk  09/16/2020  Falls in the past year? 0  Number falls in past yr:  0  Injury with Fall? 0  Risk for fall due to : No Fall Risks  Follow up Falls evaluation completed   Last Audit-C alcohol use screening Alcohol Use Disorder Test (AUDIT) 09/16/2020  1. How often do you have a drink containing alcohol? 4  2. How many drinks containing alcohol do you have on a typical day when you are drinking? 0  3. How often do you have six or more drinks on one occasion? 0  AUDIT-C Score 4  4. How often during the last year have you found that you were not able to stop drinking once you had started? 0  5. How often during the last year have you failed to do what was normally expected from you because of drinking? 0  6. How often during the last year have you needed a first drink in the morning to get yourself going after a heavy drinking session? 0  7. How often during the last year have you had a feeling of guilt of remorse after drinking? 0  8. How often during the last year have you been unable to remember what happened the night before because you had been drinking? 0  9. Have you or someone else been injured as a result of your drinking? 0  10. Has a relative or friend or a doctor or another health worker been concerned about your drinking or suggested you cut down? 0  Alcohol Use Disorder Identification Test Final Score (AUDIT)  4  Alcohol Brief Interventions/Follow-up -   A score of 3 or more in women, and 4 or more in men indicates increased risk for alcohol abuse, EXCEPT if all of the points are from question 1   No results found for any visits on 09/16/20.  Assessment & Plan    Routine Health Maintenance and Physical Exam  Exercise Activities and Dietary recommendations Goals    . Chronic Care Management     CARE PLAN ENTRY (see longitudinal plan of care for additional care plan information)  Current Barriers:  . Chronic Disease Management support, education, and care coordination needs related to Hypertension, Hyperlipidemia, Coronary Artery Disease,  GERD, COPD, Hypothyroidism, Allergic Rhinitis, and Insomnia   Hypertension BP Readings from Last 3 Encounters:  12/04/19 139/70  11/13/19 139/61  10/21/19 124/60   . Pharmacist Clinical Goal(s): o Over the next 90 days, patient will work with PharmD and providers to maintain BP goal <140/90 . Current regimen:  o Amlodipine 5mg  daily o Quinapril 40mg  daily . Interventions: o None . Patient self care activities - Over the next 90 days, patient will: o Check BP weekly, document, and provide at future appointments o Ensure daily salt intake < 2300 mg/day  Hyperlipidemia Lab Results  Component Value Date/Time   LDLCALC 80 09/05/2018 11:49 AM   . Pharmacist Clinical Goal(s): o Over the next 90 days, patient will work with PharmD and providers to maintain LDL goal < 100 . Current regimen:  o Atorvastatin 80mg  daily . Interventions: o None . Patient self care activities - Over the next 90 days, patient will: o Continuing low fat diet and regular exercise  COPD . Pharmacist Clinical Goal(s) o Over the next 90 days, patient will work with PharmD and providers to improve breathing . Current regimen:  o Anoro Ellipta 62.5/25mcg 1 puff daily o ProAir HFA 2 puffs every 6 hours as needed for wheezing/shortness of breath . Interventions: o None . Patient self care activities - Over the next 90 days, patient will: o Report to provider or PharmD if rescue inhaler usage increases  Medication management . Pharmacist Clinical Goal(s): o Over the next 90 days, patient will work with PharmD and providers to maintain optimal medication adherence . Current pharmacy: [facility] . Interventions o Comprehensive medication review performed. o Continue current medication management strategy . Patient self care activities - Over the next 90 days, patient will: o Focus on medication adherence by continuing current practices o Take medications as prescribed o Report any questions or concerns to PharmD  and/or provider(s)    . Cut out extra servings     Recommend to continue with weight watchers diet and cutting out carbohydrates.        Immunization History  Administered Date(s) Administered  . Fluad Quad(high Dose 65+) 01/11/2019  . Influenza, High Dose Seasonal PF 02/23/2017, 01/02/2018  . Influenza,inj,Quad PF,6+ Mos 03/20/2013  . Influenza-Unspecified 12/24/2013, 12/24/2013, 01/24/2015  . PFIZER(Purple Top)SARS-COV-2 Vaccination 06/18/2019, 07/09/2019  . Pneumococcal Conjugate-13 03/20/2013  . Pneumococcal Polysaccharide-23 04/02/2014  . Pneumococcal-Unspecified 03/24/2013  . Td 07/10/2003  . Tdap 01/02/2018    Health Maintenance  Topic Date Due  . COVID-19 Vaccine (3 - Booster for Pfizer series) 12/09/2019  . INFLUENZA VACCINE  11/23/2020  . MAMMOGRAM  10/23/2021  . TETANUS/TDAP  01/03/2028  . COLONOSCOPY (Pts 45-68yrs Insurance coverage will need to be confirmed)  12/03/2029  . DEXA SCAN  Completed  . Hepatitis C Screening  Completed  . PNA vac Low Risk  Adult  Completed  . HPV VACCINES  Aged Out    Discussed health benefits of physical activity, and encouraged her to engage in regular exercise appropriate for her age and condition.  1. Annual physical exam  2. Arteriosclerosis of coronary artery All risk factors treated.  3. Essential (primary) hypertension Follow home blood pressure readings.  4. Pure hypercholesterolemia On statin  5. Typical atrial flutter (HCC) Now on Xarelto after allergy to Eliquis   No follow-ups on file.     I, [name], MD, have reviewed all documentation for this visit. The documentation on 09/21/20 for the exam, diagnosis, procedures, and orders are all accurate and complete.    Zakirah Weingart Wendelyn Breslow, MD  [facility] [phone] (phone) [phone] (fax)  [facility]

## 2020-09-16 NOTE — Progress Notes (Signed)
Electrophysiology Office Follow up Visit Note:    Date:  09/16/2020   ID:  Penny, Reed 02-26-1946, MRN [id number]  PCP:  Maple Hudson., MD  [facility] Cardiologist:  None  [facility] Electrophysiologist:  Lanier Prude, MD    Interval History:    Penny Reed is a 75 y.o. female who presents for a follow up visit.  She had an EP study on Sep 04, 2020.  During the EP study multiple different atypical atrial flutters were induced with aberrant ventricular conduction.  Tachycardia cycle length of the atrial flutters mimicked her clinical tachycardia.  At that time, we elected to start Eliquis 5 mg twice daily for stroke prophylaxis and see her back today to discuss initiating antiarrhythmic therapy.  Since her in the Eliquis she experienced an allergic reaction with lip swelling and mouth ulcers and lower extremity swelling.  Since stopping the Eliquis those symptoms have resolved.  She has since restarted her Xarelto and is tolerating that medication well. Since the EP study she has not had a recurrent episode of her atrial flutter.     Past Medical History:  Diagnosis Date  . Allergic rhinitis   . CAD (coronary artery disease)   . Complication of anesthesia    woke up during colonoscopy  . Hyperlipidemia   . Hypertension   . Hypothyroidism   . LVH (left ventricular hypertrophy)     Past Surgical History:  Procedure Laterality Date  . ABDOMINAL HYSTERECTOMY     has left ovary  . APPENDECTOMY    . CARDIAC CATHETERIZATION  09/2005   had 1 stent and 3 balloons placed  . COLONOSCOPY WITH PROPOFOL N/A 12/04/2019   Procedure: COLONOSCOPY WITH PROPOFOL;  Surgeon: Earline Mayotte, MD;  Location: [facility] ENDOSCOPY;  Service: Endoscopy;  Laterality: N/A;  . CORONARY ARTERY BYPASS GRAFT    . SVT ABLATION N/A 09/04/2020   Procedure: SVT ABLATION;  Surgeon: Lanier Prude, MD;  Location: [facility] INVASIVE CV LAB;  Service: Cardiovascular;  Laterality: N/A;  .  TONSILLECTOMY      Current Medications: Current Meds  Medication Sig  . albuterol (PROAIR HFA) 108 (90 BASE) MCG/ACT inhaler Inhale 2 puffs into the lungs every 6 (six) hours as needed for wheezing or shortness of breath.  [name] amLODipine (NORVASC) 5 MG tablet TAKE 1 TABLET DAILY  . ANORO ELLIPTA 62.5-25 MCG/INH AEPB INHALE 1 PUFF BY MOUTH EVERY DAY  . atorvastatin (LIPITOR) 80 MG tablet Take 1 tablet (80 mg total) by mouth daily.  [name] azelastine (ASTELIN) 0.1 % nasal spray SPRAY 2 SPRAYS INTO EACH NOSTRIL EVERY DAY  . Calcium Carbonate (CALCIUM 600 PO) Take 600 mg by mouth in the morning and at bedtime.  . cetirizine (ZYRTEC) 10 MG tablet Take 10 mg by mouth daily.  . cholecalciferol (VITAMIN D3) 25 MCG (1000 UNIT) tablet Take 1,000 Units by mouth daily.  . furosemide (LASIX) 40 MG tablet Take 20 mg by mouth daily as needed for edema.  [name] levothyroxine (SYNTHROID) 112 MCG tablet TAKE ONE TABLET BY MOUTH SIX DAYS PER WEEK. TAKE 1 AND 1/2 TABLET BY MOUTH ONE DAY PER WEEK. (Patient taking differently: Take 112-168 mcg by mouth See admin instructions. TAKE ONE TABLET (112 mcg) BY MOUTH SIX DAYS PER WEEK. TAKE 1 AND 1/2 TABLET (168 mcg) BY MOUTH ONE DAY PER WEEK.)  . Multiple Vitamin (MULTIVITAMIN) tablet Take 1 tablet by mouth daily. Standard Process  . nitroGLYCERIN (NITROSTAT) 0.4 MG SL tablet  Place 0.4 mg under the tongue every 5 (five) minutes as needed for chest pain.   . Omega-3 Fatty Acids (FISH OIL) 1000 MG CAPS Take 3,000 mg by mouth daily.  . psyllium (REGULOID) 0.52 g capsule Take 2.6 g by mouth daily. 5 Fiber capsules  . quinapril (ACCUPRIL) 40 MG tablet Take 1 tablet (40 mg total) by mouth daily.  . rivaroxaban (XARELTO) 20 MG TABS tablet Take 1 tablet (20 mg total) by mouth daily with supper.  . zolpidem (AMBIEN) 5 MG tablet TAKE 1 TABLET (5 MG TOTAL) BY MOUTH AT BEDTIME AS NEEDED FOR SLEEP.     Allergies:   Adhesive [tape] and Azithromycin   Social History   Socioeconomic History   . Marital status: Married    Spouse name: Not on file  . Number of children: 3  . Years of education: Not on file  . Highest education level: Associate degree: occupational, Scientist, product/process development, or vocational program  Occupational History  . Not on file  Tobacco Use  . Smoking status: Former Smoker    Packs/day: 0.25    Years: 25.00    Pack years: 6.25    Types: Cigarettes    Quit date: 04/25/2005    Years since quitting: 15.4  . Smokeless tobacco: Never Used  Vaping Use  . Vaping Use: Never used  Substance and Sexual Activity  . Alcohol use: Yes    Alcohol/week: 5.0 standard drinks    Types: 5 Glasses of wine per week    Comment: spread out between 2-3 nights a week  . Drug use: No  . Sexual activity: Not on file  Other Topics Concern  . Not on file  Social History Narrative  . Not on file   Social Determinants of Health   Financial Resource Strain: Low Risk   . Difficulty of Paying Living Expenses: Not hard at all  Food Insecurity: Not on file  Transportation Needs: No Transportation Needs  . Lack of Transportation (Medical): No  . Lack of Transportation (Non-Medical): No  Physical Activity: Not on file  Stress: Not on file  Social Connections: Not on file     Family History: The patient's family history includes Asthma in her sister; Breast cancer in her maternal aunt; Breast cancer (age of onset: 39) in her sister; Cancer in her sister; Cancer - Colon in her paternal grandmother; Coronary artery disease in her father; Diabetes in her mother; Heart attack in her brother and father; Hyperlipidemia in her sister; Hypertension in her sister; Scleroderma in her sister; Stroke in her mother; Thyroid disease in her sister.  ROS:   Please see the history of present illness.    All other systems reviewed and are negative.  EKGs/Labs/Other Studies Reviewed:    The following studies were reviewed today:   EKG:  The ekg ordered today demonstrates sinus rhythm.  QTC is 414 ms.  PR  interval is 208 ms.  Recent Labs: 08/11/2020: BUN 13; Creatinine, Ser 0.85; Hemoglobin 14.0; Platelets 198; Potassium 4.2; Sodium 139  Recent Lipid Panel    Component Value Date/Time   CHOL 184 09/05/2018 1149   TRIG 77 09/05/2018 1149   HDL 89 09/05/2018 1149   CHOLHDL 2.1 09/05/2018 1149   LDLCALC 80 09/05/2018 1149    Physical Exam:    VS:  BP (!) 160/60 (BP Location: Left Arm, Patient Position: Sitting, Cuff Size: Normal)   Pulse (!) 48   Ht 5\' 7"  (1.702 m)   Wt 184 lb (83.5 kg)  Comment: Per patient  SpO2 98%   BMI 28.82 kg/m     Wt Readings from Last 3 Encounters:  09/16/20 184 lb (83.5 kg)  09/16/20 184 lb (83.5 kg)  09/04/20 180 lb (81.6 kg)     GEN:  Well nourished, well developed in no acute distress HEENT: Normal NECK: No JVD; No carotid bruits LYMPHATICS: No lymphadenopathy CARDIAC: RRR, no murmurs, rubs, gallops.  Right femoral access site with very minor lymphadenopathy medial to the puncture sites.  No swelling or redness at the venous puncture sites. RESPIRATORY:  Clear to auscultation without rales, wheezing or rhonchi  ABDOMEN: Soft, non-tender, non-distended MUSCULOSKELETAL:  No edema; No deformity  SKIN: Warm and dry NEUROLOGIC:  Alert and oriented x 3 PSYCHIATRIC:  Normal affect   ASSESSMENT:    1. Atypical atrial flutter (HCC)   2. Coronary artery disease involving native coronary artery of native heart without angina pectoris   3. Hx of CABG    PLAN:    In order of problems listed above:  1. Atypical atrial flutter At least 3 different atypical flutter circuits induced during recent EP study.  She is currently taking Xarelto for stroke prophylaxis.  She did not tolerate Eliquis with an allergic reaction.  I discussed the management options for her atrial flutter including continued conservative management with anticoagulation, rhythm control using antiarrhythmic or rhythm control using repeat ablation to target atrial fibrillation and atypical  flutters.  I do not think we should start with an invasive option.  For antiarrhythmic options we discussed dofetilide and amiodarone.  I discussed the risks and need for close monitoring on both of these medications.  Given her young age, I would favor using dofetilide.  We discussed how her baseline bradycardia may limit antiarrhythmic therapy.  Her QTC is acceptable for Tikosyn.  After our discussion, we decided to continue with the current management plan and monitor for recurrence of her arrhythmias.  If she has an increased burden of arrhythmias we will discuss starting an antiarrhythmic.    2.  Coronary artery disease No ischemic symptoms.  Continue atorvastatin and Xarelto.  3.  Hypertension Blood pressure 138/62. Continue quinapril, Lasix, amlodipine. Close follow-up with primary care physician  Follow-up 3 months or sooner as needed    Total time spent with patient today 35 minutes. This includes reviewing records, evaluating the patient and coordinating care.   Medication Adjustments/Labs and Tests Ordered: Current medicines are reviewed at length with the patient today.  Concerns regarding medicines are outlined above.  No orders of the defined types were placed in this encounter.  No orders of the defined types were placed in this encounter.    Signed, Steffanie Dunn, MD, [facility], [facility] 09/16/2020 11:25 AM    Electrophysiology [facility]

## 2020-09-16 NOTE — Patient Instructions (Addendum)
Medication Instructions:  Your physician recommends that you continue on your current medications as directed. Please refer to the Current Medication list given to you today. *If you need a refill on your cardiac medications before your next appointment, please call your pharmacy*  Lab Work: None ordered. If you have labs (blood work) drawn today and your tests are completely normal, you will receive your results only by: [name] MyChart Message (if you have MyChart) OR . A paper copy in the mail If you have any lab test that is abnormal or we need to change your treatment, we will call you to review the results.  Testing/Procedures: None ordered.  Follow-Up: At [facility], you and your health needs are our priority.  As part of our continuing mission to provide you with exceptional heart care, we have created designated Provider Care Teams.  These Care Teams include your primary Cardiologist (physician) and Advanced Practice Providers (APPs -  Physician Assistants and Nurse Practitioners) who all work together to provide you with the care you need, when you need it.  Your next appointment:   Your physician wants you to follow-up: December 09, 2020 at 9:40 am with Dr. Lalla Brothers at [facility]

## 2020-09-28 ENCOUNTER — Other Ambulatory Visit: Payer: Self-pay | Admitting: Family Medicine

## 2020-09-28 DIAGNOSIS — Z1231 Encounter for screening mammogram for malignant neoplasm of breast: Secondary | ICD-10-CM

## 2020-09-29 ENCOUNTER — Telehealth: Payer: Self-pay

## 2020-09-29 DIAGNOSIS — H25013 Cortical age-related cataract, bilateral: Secondary | ICD-10-CM | POA: Diagnosis not present

## 2020-09-29 DIAGNOSIS — H524 Presbyopia: Secondary | ICD-10-CM | POA: Diagnosis not present

## 2020-09-29 DIAGNOSIS — H52223 Regular astigmatism, bilateral: Secondary | ICD-10-CM | POA: Diagnosis not present

## 2020-09-29 DIAGNOSIS — H43812 Vitreous degeneration, left eye: Secondary | ICD-10-CM | POA: Diagnosis not present

## 2020-09-29 DIAGNOSIS — H5203 Hypermetropia, bilateral: Secondary | ICD-10-CM | POA: Diagnosis not present

## 2020-09-29 DIAGNOSIS — H2513 Age-related nuclear cataract, bilateral: Secondary | ICD-10-CM | POA: Diagnosis not present

## 2020-09-29 NOTE — Progress Notes (Signed)
Chronic Care Management Pharmacy Assistant   Name: Penny Reed  MRN: [id number] DOB: Mar 24, 1946  Reason for Encounter:COPD  Disease State Call.   Recent office visits:  09/16/2020 Dr. Sullivan Lone MD (PCP)   Recent consult visits:  09/16/2020 Dr.Lambert MD (Cardiology)  09/11/2020 Dr. Lalla Brothers MD (Cardiology) Stop Eliquis , start Xarelto 07/28/2020 Dr. Lalla Brothers MD (Cardiology)  07/15/2020 Dr. Juliann Pares MD (Cardiology)  Hospital visits:  Medication Reconciliation was completed by comparing discharge summary, patient's EMR and Pharmacy list, and upon discussion with patient.  Admitted to the hospital on 09/04/2020 due to SVT ABLATION. Discharge date was 09/04/2020. Discharged from [facility].    New?Medications Started at [facility] Discharge:?? -started Eliquis 5 mg 1 tablet 2 times daily  Medication Changes at Hospital Discharge: -Changed None ID  Medications Discontinued at Hospital Discharge: -Stopped None ID   Medications that remain the same after Hospital Discharge:??  -All other medications will remain the same.    Medications: Outpatient Encounter Medications as of 09/29/2020  Medication Sig  . albuterol (PROAIR HFA) 108 (90 BASE) MCG/ACT inhaler Inhale 2 puffs into the lungs every 6 (six) hours as needed for wheezing or shortness of breath.  [name] amLODipine (NORVASC) 5 MG tablet TAKE 1 TABLET DAILY  . ANORO ELLIPTA 62.5-25 MCG/INH AEPB INHALE 1 PUFF BY MOUTH EVERY DAY  . atorvastatin (LIPITOR) 80 MG tablet Take 1 tablet (80 mg total) by mouth daily.  [name] azelastine (ASTELIN) 0.1 % nasal spray SPRAY 2 SPRAYS INTO EACH NOSTRIL EVERY DAY  . Calcium Carbonate (CALCIUM 600 PO) Take 600 mg by mouth in the morning and at bedtime.  . cetirizine (ZYRTEC) 10 MG tablet Take 10 mg by mouth daily.  . cholecalciferol (VITAMIN D3) 25 MCG (1000 UNIT) tablet Take 1,000 Units by mouth daily.  . furosemide (LASIX) 40 MG tablet Take 20 mg by mouth daily as needed for edema.   [name] levothyroxine (SYNTHROID) 112 MCG tablet TAKE ONE TABLET BY MOUTH SIX DAYS PER WEEK. TAKE 1 AND 1/2 TABLET BY MOUTH ONE DAY PER WEEK. (Patient taking differently: Take 112-168 mcg by mouth See admin instructions. TAKE ONE TABLET (112 mcg) BY MOUTH SIX DAYS PER WEEK. TAKE 1 AND 1/2 TABLET (168 mcg) BY MOUTH ONE DAY PER WEEK.)  . Multiple Vitamin (MULTIVITAMIN) tablet Take 1 tablet by mouth daily. Standard Process  . nitroGLYCERIN (NITROSTAT) 0.4 MG SL tablet Place 0.4 mg under the tongue every 5 (five) minutes as needed for chest pain.   . Omega-3 Fatty Acids (FISH OIL) 1000 MG CAPS Take 3,000 mg by mouth daily.  . psyllium (REGULOID) 0.52 g capsule Take 2.6 g by mouth daily. 5 Fiber capsules  . quinapril (ACCUPRIL) 40 MG tablet Take 1 tablet (40 mg total) by mouth daily.  . rivaroxaban (XARELTO) 20 MG TABS tablet Take 1 tablet (20 mg total) by mouth daily with supper.  . zolpidem (AMBIEN) 5 MG tablet TAKE 1 TABLET (5 MG TOTAL) BY MOUTH AT BEDTIME AS NEEDED FOR SLEEP.   No facility-administered encounter medications on file as of 09/29/2020.    Star Rating Drugs: Atorvastatin 80 mg last filled on 09/02/2020 [facility] day [facility]. Quinapril 40 mg last filled on 07/25/2020 for [facility] day supply at [facility].  . Current COPD regimen:  ? Anoro Ellipta 62.5/25mcg 1 puff daily ? ProAir HFA 2 puffs every 6 hours as needed for wheezing/shortness of breath . No flowsheet data found. . Any recent hospitalizations or ED visits since  last visit with CPP? Yes . Denies COPD symptoms, including Increased shortness of breath , Rescue medicine is not helping, Shortness of breath at rest, Symptoms worse with exercise, Symptoms worse at night and Wheezing . What recent interventions/DTPs have been made by any provider to improve breathing since last visit:None ID . Have you had exacerbation/flare-up since last visit? Yes  o Patient states she "kinda had a flare-up but it was her heart rate not her  breathing and this has been taken care of by a provider". . What do you do when you are short of breath?  Rescue medication and Rest  Respiratory Devices/Equipment . Do you have a nebulizer? No . Do you use a Peak Flow Meter? No . Do you use a maintenance inhaler? Yes . How often do you forget to use your daily inhaler? Never . Do you use a rescue inhaler? Yes . How often do you use your rescue inhaler?  prn . Do you use a spacer with your inhaler? No  Adherence Review: . Does the patient have >5 day gap between last estimated fill date for maintenance inhaler medications? No   Everlean Cherry Clinical Lobbyist [phone]

## 2020-10-02 DIAGNOSIS — J449 Chronic obstructive pulmonary disease, unspecified: Secondary | ICD-10-CM | POA: Diagnosis not present

## 2020-10-02 DIAGNOSIS — J309 Allergic rhinitis, unspecified: Secondary | ICD-10-CM | POA: Diagnosis not present

## 2020-10-02 DIAGNOSIS — I4892 Unspecified atrial flutter: Secondary | ICD-10-CM | POA: Diagnosis not present

## 2020-10-02 DIAGNOSIS — I25119 Atherosclerotic heart disease of native coronary artery with unspecified angina pectoris: Secondary | ICD-10-CM | POA: Diagnosis not present

## 2020-10-02 DIAGNOSIS — G4700 Insomnia, unspecified: Secondary | ICD-10-CM | POA: Diagnosis not present

## 2020-10-02 DIAGNOSIS — I252 Old myocardial infarction: Secondary | ICD-10-CM | POA: Diagnosis not present

## 2020-10-02 DIAGNOSIS — E785 Hyperlipidemia, unspecified: Secondary | ICD-10-CM | POA: Diagnosis not present

## 2020-10-02 DIAGNOSIS — K5900 Constipation, unspecified: Secondary | ICD-10-CM | POA: Diagnosis not present

## 2020-10-02 DIAGNOSIS — I10 Essential (primary) hypertension: Secondary | ICD-10-CM | POA: Diagnosis not present

## 2020-10-02 DIAGNOSIS — E039 Hypothyroidism, unspecified: Secondary | ICD-10-CM | POA: Diagnosis not present

## 2020-10-05 ENCOUNTER — Other Ambulatory Visit: Payer: Self-pay | Admitting: Family Medicine

## 2020-10-05 DIAGNOSIS — E039 Hypothyroidism, unspecified: Secondary | ICD-10-CM

## 2020-10-05 DIAGNOSIS — E7800 Pure hypercholesterolemia, unspecified: Secondary | ICD-10-CM

## 2020-10-05 DIAGNOSIS — J309 Allergic rhinitis, unspecified: Secondary | ICD-10-CM

## 2020-10-05 DIAGNOSIS — I10 Essential (primary) hypertension: Secondary | ICD-10-CM

## 2020-10-05 NOTE — Telephone Encounter (Signed)
Requested medication (s) are due for refill today: Yes  Requested medication (s) are on the active medication list: Yes  Last refill:  09/10/19  Future visit scheduled: Yes  Notes to clinic:  Prescriptions expired.    Requested Prescriptions  Pending Prescriptions Disp Refills   levothyroxine (SYNTHROID) 112 MCG tablet [Pharmacy Med Name: LEVOTHYROXINE 112 MCG TABLET] 100 tablet 4    Sig: TAKE ONE TABLET BY MOUTH SIX DAYS PER WEEK. TAKE 1 AND 1/2 TABLET BY MOUTH ONE DAY PER WEEK.      Endocrinology:  Hypothyroid Agents Failed - 10/05/2020 10:47 AM      Failed - TSH needs to be rechecked within 3 months after an abnormal result. Refill until TSH is due.      Failed - TSH in normal range and within 360 days    TSH  Date Value Ref Range Status  09/05/2018 5.740 (H) 0.450 - 4.500 uIU/mL Final          Passed - Valid encounter within last 12 months    Recent Outpatient Visits           2 weeks ago Annual physical exam   [facility] Maple Hudson., MD   10 months ago Arteriosclerosis of coronary artery   [facility] Maple Hudson., MD   11 months ago Perforation of right tympanic membrane   [facility] Chrismon, Jodell Cipro, PA-C   1 year ago Annual physical exam   [facility] Maple Hudson., MD   1 year ago Acute cystitis without hematuria   [facility] Maple Hudson., MD       Future Appointments             In 2 months Lanier Prude, MD [facility], [facility]   In 5 months Maple Hudson., MD [facility], [facility]               atorvastatin (LIPITOR) 80 MG tablet [Pharmacy Med Name: ATORVASTATIN 80 MG TABLET] 90 tablet 4    Sig: TAKE 1 TABLET BY MOUTH EVERY DAY      Cardiovascular:  Antilipid - Statins Failed - 10/05/2020 10:47 AM      Failed - Total Cholesterol in normal range and within 360 days    Cholesterol, Total   Date Value Ref Range Status  09/05/2018 184 100 - 199 mg/dL Final          Failed - LDL in normal range and within 360 days    LDL Calculated  Date Value Ref Range Status  09/05/2018 80 0 - 99 mg/dL Final          Failed - HDL in normal range and within 360 days    HDL  Date Value Ref Range Status  09/05/2018 89 >39 mg/dL Final          Failed - Triglycerides in normal range and within 360 days    Triglycerides  Date Value Ref Range Status  09/05/2018 77 0 - 149 mg/dL Final          Passed - Patient is not pregnant      Passed - Valid encounter within last 12 months    Recent Outpatient Visits           2 weeks ago Annual physical exam   [facility] Maple Hudson., MD   10 months ago Arteriosclerosis of coronary artery  [facility] Sullivan Lone, Leonette Monarch., MD   11 months ago Perforation of right tympanic membrane   [facility] Chrismon, Jodell Cipro, PA-C   1 year ago Annual physical exam   [facility] Maple Hudson., MD   1 year ago Acute cystitis without hematuria   [facility] Maple Hudson., MD       Future Appointments             In 2 months Lanier Prude, MD [facility], [facility]   In 5 months Maple Hudson., MD [facility], [facility]               amLODipine (NORVASC) 5 MG tablet [Pharmacy Med Name: AMLODIPINE BESYLATE 5 MG TAB] 90 tablet 4    Sig: TAKE 1 TABLET BY MOUTH EVERY DAY      Cardiovascular:  Calcium Channel Blockers Failed - 10/05/2020 10:47 AM      Failed - Last BP in normal range    BP Readings from Last 1 Encounters:  09/16/20 (!) 160/60          Passed - Valid encounter within last 6 months    Recent Outpatient Visits           2 weeks ago Annual physical exam   [facility] Maple Hudson., MD   10 months ago Arteriosclerosis of coronary artery   [facility] Maple Hudson., MD   11 months ago Perforation of right tympanic membrane   [facility] Chrismon, Jodell Cipro, PA-C   1 year ago Annual physical exam   [facility] Maple Hudson., MD   1 year ago Acute cystitis without hematuria   [facility] Maple Hudson., MD       Future Appointments             In 2 months Lanier Prude, MD [facility], [facility]   In 5 months Maple Hudson., MD [facility], [facility]               quinapril (ACCUPRIL) 40 MG tablet [Pharmacy Med Name: QUINAPRIL 40 MG TABLET] 90 tablet 4    Sig: TAKE 1 TABLET BY MOUTH EVERY DAY      Cardiovascular:  ACE Inhibitors Failed - 10/05/2020 10:47 AM      Failed - Last BP in normal range    BP Readings from Last 1 Encounters:  09/16/20 (!) 160/60          Passed - Cr in normal range and within 180 days    Creatinine, Ser  Date Value Ref Range Status  08/11/2020 0.85 0.57 - 1.00 mg/dL Final          Passed - K in normal range and within 180 days    Potassium  Date Value Ref Range Status  08/11/2020 4.2 3.5 - 5.2 mmol/L Final          Passed - Patient is not pregnant      Passed - Valid encounter within last 6 months    Recent Outpatient Visits           2 weeks ago Annual physical exam   [facility] Maple Hudson., MD   10 months ago Arteriosclerosis of coronary artery   [facility] Maple Hudson., MD   11 months ago Perforation of  right tympanic membrane   [facility] Chrismon, Jodell Cipro, PA-C   1 year ago Annual physical exam   [facility] Maple Hudson., MD   1 year ago Acute cystitis without hematuria   [facility] Maple Hudson., MD       Future Appointments             In 2 months Lanier Prude, MD [facility], [facility]   In 5 months Maple Hudson., MD [facility], [facility]              Signed Prescriptions Disp Refills   azelastine (ASTELIN) 0.1 % nasal spray 30 mL 1    Sig: SPRAY 2 SPRAYS INTO EACH NOSTRIL EVERY DAY      Ear, Nose, and Throat: Nasal Preparations - Antiallergy Passed - 10/05/2020 10:47 AM      Passed - Valid encounter within last 12 months    Recent Outpatient Visits           2 weeks ago Annual physical exam   [facility] Maple Hudson., MD   10 months ago Arteriosclerosis of coronary artery   [facility] Maple Hudson., MD   11 months ago Perforation of right tympanic membrane   [facility] Chrismon, Jodell Cipro, PA-C   1 year ago Annual physical exam   [facility] Maple Hudson., MD   1 year ago Acute cystitis without hematuria   [facility] Maple Hudson., MD       Future Appointments             In 2 months Lanier Prude, MD [facility], [facility]   In 5 months Maple Hudson., MD [facility], [facility]               ANORO ELLIPTA 62.5-25 MCG/INH AEPB 120 each 0    Sig: INHALE 1 PUFF BY MOUTH EVERY DAY      Pulmonology:  Combination Products Passed - 10/05/2020 10:47 AM      Passed - Valid encounter within last 12 months    Recent Outpatient Visits           2 weeks ago Annual physical exam   [facility] Maple Hudson., MD   10 months ago Arteriosclerosis of coronary artery   [facility] Maple Hudson., MD   11 months ago Perforation of right tympanic membrane   [facility] Chrismon, Jodell Cipro, PA-C   1 year ago Annual physical exam   [facility] Maple Hudson., MD   1 year ago Acute cystitis without hematuria   [facility] Maple Hudson., MD       Future Appointments             In 2 months Lanier Prude, MD [facility], [facility]   In 5 months Maple Hudson., MD [facility], [facility]

## 2020-10-07 ENCOUNTER — Ambulatory Visit: Payer: Medicare HMO | Admitting: Cardiology

## 2020-10-27 ENCOUNTER — Telehealth: Payer: Self-pay

## 2020-10-29 DIAGNOSIS — H25013 Cortical age-related cataract, bilateral: Secondary | ICD-10-CM | POA: Diagnosis not present

## 2020-10-29 DIAGNOSIS — H52223 Regular astigmatism, bilateral: Secondary | ICD-10-CM | POA: Diagnosis not present

## 2020-10-29 DIAGNOSIS — Z0100 Encounter for examination of eyes and vision without abnormal findings: Secondary | ICD-10-CM | POA: Diagnosis not present

## 2020-10-29 DIAGNOSIS — H2513 Age-related nuclear cataract, bilateral: Secondary | ICD-10-CM | POA: Diagnosis not present

## 2020-10-29 DIAGNOSIS — H5203 Hypermetropia, bilateral: Secondary | ICD-10-CM | POA: Diagnosis not present

## 2020-10-29 DIAGNOSIS — H524 Presbyopia: Secondary | ICD-10-CM | POA: Diagnosis not present

## 2020-10-29 DIAGNOSIS — H43812 Vitreous degeneration, left eye: Secondary | ICD-10-CM | POA: Diagnosis not present

## 2020-11-03 ENCOUNTER — Other Ambulatory Visit: Payer: Self-pay

## 2020-11-03 ENCOUNTER — Ambulatory Visit
Admission: RE | Admit: 2020-11-03 | Discharge: 2020-11-03 | Disposition: A | Discharge status: Home / Self Care | Payer: Medicare HMO | Source: Ambulatory Visit | Attending: Family Medicine | Admitting: Family Medicine

## 2020-11-03 DIAGNOSIS — Z1231 Encounter for screening mammogram for malignant neoplasm of breast: Secondary | ICD-10-CM | POA: Insufficient documentation

## 2020-11-17 ENCOUNTER — Other Ambulatory Visit: Payer: Self-pay | Admitting: Family Medicine

## 2020-11-17 NOTE — Telephone Encounter (Signed)
Please review. Ok to refill?  

## 2020-12-09 ENCOUNTER — Encounter: Payer: Self-pay | Admitting: Cardiology

## 2020-12-09 ENCOUNTER — Ambulatory Visit: Payer: Medicare HMO | Admitting: Cardiology

## 2020-12-09 ENCOUNTER — Other Ambulatory Visit: Payer: Self-pay

## 2020-12-09 VITALS — BP 128/78 | HR 52 | Ht 67.0 in | Wt 176.8 lb

## 2020-12-09 DIAGNOSIS — I2510 Atherosclerotic heart disease of native coronary artery without angina pectoris: Secondary | ICD-10-CM | POA: Diagnosis not present

## 2020-12-09 DIAGNOSIS — I484 Atypical atrial flutter: Secondary | ICD-10-CM

## 2020-12-09 NOTE — Progress Notes (Signed)
Electrophysiology Office Follow up Visit Note:    Date:  12/09/2020   ID:  Penny Reed, DOB 09-11-1945, MRN [id number]  PCP:  Maple Hudson., MD  [facility] Cardiologist:  None  [facility] Electrophysiologist:  Lanier Prude, MD    Interval History:    Penny Reed is a 75 y.o. female who presents for a follow up visit after an SVT ablation on Sep 04, 2020.  During the EP study there are multiple atypical atrial flutters induced consistent with her clinical tachycardias.  I cardioverted the patient during the procedure and started Eliquis 5 mg by mouth twice daily.  I plan to see her back in clinic to discuss antiarrhythmic drug therapy options.  I saw her back on Sep 16, 2020 and she was doing well.  She had not had a recurrence of her arrhythmia.  She had an allergic reaction Eliquis and was changed to Xarelto.  At that appointment we decided to pursue a watchful waiting approach.  If she had a recurrence of arrhythmia we were going to start an antiarrhythmic.  Since I last saw her she did have 1 episode of tachycardia that lasted about 1 hour.  It resolved spontaneously.  She is exercising 6 days a week.  She does await lifting circuit 3 days a week and then she does 1 hour of an elliptical 3 days a week.  She is tolerating the Xarelto without bleeding issues.     Past Medical History:  Diagnosis Date   Allergic rhinitis    CAD (coronary artery disease)    Complication of anesthesia    woke up during colonoscopy   Hyperlipidemia    Hypertension    Hypothyroidism    LVH (left ventricular hypertrophy)     Past Surgical History:  Procedure Laterality Date   ABDOMINAL HYSTERECTOMY     has left ovary   APPENDECTOMY     CARDIAC CATHETERIZATION  09/2005   had 1 stent and 3 balloons placed   COLONOSCOPY WITH PROPOFOL N/A 12/04/2019   Procedure: COLONOSCOPY WITH PROPOFOL;  Surgeon: Earline Mayotte, MD;  Location: [facility] ENDOSCOPY;  Service: Endoscopy;   Laterality: N/A;   CORONARY ARTERY BYPASS GRAFT     SVT ABLATION N/A 09/04/2020   Procedure: SVT ABLATION;  Surgeon: Lanier Prude, MD;  Location: [facility] INVASIVE CV LAB;  Service: Cardiovascular;  Laterality: N/A;   TONSILLECTOMY      Current Medications: Current Meds  Medication Sig   albuterol (PROAIR HFA) 108 (90 BASE) MCG/ACT inhaler Inhale 2 puffs into the lungs every 6 (six) hours as needed for wheezing or shortness of breath.   amLODipine (NORVASC) 5 MG tablet TAKE 1 TABLET BY MOUTH EVERY DAY   ANORO ELLIPTA 62.5-25 MCG/INH AEPB INHALE 1 PUFF BY MOUTH EVERY DAY   atorvastatin (LIPITOR) 80 MG tablet TAKE 1 TABLET BY MOUTH EVERY DAY   azelastine (ASTELIN) 0.1 % nasal spray SPRAY 2 SPRAYS INTO EACH NOSTRIL EVERY DAY   Calcium Carbonate (CALCIUM 600 PO) Take 600 mg by mouth in the morning and at bedtime.   cetirizine (ZYRTEC) 10 MG tablet Take 10 mg by mouth daily.   cholecalciferol (VITAMIN D3) 25 MCG (1000 UNIT) tablet Take 1,000 Units by mouth daily.   furosemide (LASIX) 40 MG tablet Take 20 mg by mouth daily as needed for edema.   levothyroxine (SYNTHROID) 112 MCG tablet TAKE ONE TABLET BY MOUTH SIX DAYS PER WEEK. TAKE 1 AND 1/2 TABLET BY MOUTH  ONE DAY PER WEEK.   Multiple Vitamin (MULTIVITAMIN) tablet Take 1 tablet by mouth daily. Standard Process   nitroGLYCERIN (NITROSTAT) 0.4 MG SL tablet Place 0.4 mg under the tongue every 5 (five) minutes as needed for chest pain.    Omega-3 Fatty Acids (FISH OIL) 1000 MG CAPS Take 3,000 mg by mouth daily.   quinapril (ACCUPRIL) 40 MG tablet TAKE 1 TABLET BY MOUTH EVERY DAY   rivaroxaban (XARELTO) 20 MG TABS tablet Take 1 tablet (20 mg total) by mouth daily with supper.   zolpidem (AMBIEN) 5 MG tablet TAKE 1 TABLET BY MOUTH AT BEDTIME AS NEEDED FOR SLEEP.     Allergies:   Adhesive [tape], Azithromycin, and Eliquis [apixaban]   Social History   Socioeconomic History   Marital status: Married    Spouse name: Not on file   Number of  children: 3   Years of education: Not on file   Highest education level: Associate degree: occupational, Scientist, product/process development, or vocational program  Occupational History   Not on file  Tobacco Use   Smoking status: Former    Packs/day: 0.25    Years: 25.00    Pack years: 6.25    Types: Cigarettes    Quit date: 04/25/2005    Years since quitting: 15.6   Smokeless tobacco: Never  Vaping Use   Vaping Use: Never used  Substance and Sexual Activity   Alcohol use: Yes    Alcohol/week: 5.0 standard drinks    Types: 5 Glasses of wine per week    Comment: spread out between 2-3 nights a week   Drug use: No   Sexual activity: Not on file  Other Topics Concern   Not on file  Social History Narrative   Not on file   Social Determinants of Health   Financial Resource Strain: Low Risk    Difficulty of Paying Living Expenses: Not hard at all  Food Insecurity: Not on file  Transportation Needs: No Transportation Needs   Lack of Transportation (Medical): No   Lack of Transportation (Non-Medical): No  Physical Activity: Not on file  Stress: Not on file  Social Connections: Not on file     Family History: The patient's family history includes Asthma in her sister; Breast cancer in her maternal aunt; Breast cancer (age of onset: 83) in her sister; Cancer in her sister; Cancer - Colon in her paternal grandmother; Coronary artery disease in her father; Diabetes in her mother; Heart attack in her brother and father; Hyperlipidemia in her sister; Hypertension in her sister; Scleroderma in her sister; Stroke in her mother; Thyroid disease in her sister.  ROS:   Please see the history of present illness.    All other systems reviewed and are negative.  EKGs/Labs/Other Studies Reviewed:    The following studies were reviewed today:   EKG:  The ekg ordered today demonstrates sinus rhythm.  Recent Labs: 08/11/2020: BUN 13; Creatinine, Ser 0.85; Hemoglobin 14.0; Platelets 198; Potassium 4.2; Sodium 139   Recent Lipid Panel    Component Value Date/Time   CHOL 184 09/05/2018 1149   TRIG 77 09/05/2018 1149   HDL 89 09/05/2018 1149   CHOLHDL 2.1 09/05/2018 1149   LDLCALC 80 09/05/2018 1149    Physical Exam:    VS:  BP 128/78   Pulse (!) 52   Ht 5\' 7"  (1.702 m)   Wt 176 lb 12.8 oz (80.2 kg)   SpO2 98%   BMI 27.69 kg/m     Wt Readings  from Last 3 Encounters:  12/09/20 176 lb 12.8 oz (80.2 kg)  09/16/20 184 lb (83.5 kg)  09/16/20 184 lb (83.5 kg)     GEN:  Well nourished, well developed in no acute distress HEENT: Normal NECK: No JVD; No carotid bruits LYMPHATICS: No lymphadenopathy CARDIAC: RRR, no murmurs, rubs, gallops RESPIRATORY:  Clear to auscultation without rales, wheezing or rhonchi  ABDOMEN: Soft, non-tender, non-distended MUSCULOSKELETAL:  No edema; No deformity  SKIN: Warm and dry NEUROLOGIC:  Alert and oriented x 3 PSYCHIATRIC:  Normal affect   ASSESSMENT:    1. Atypical atrial flutter (HCC)   2. Coronary artery disease involving native coronary artery of native heart without angina pectoris    PLAN:    In order of problems listed above:  #Atypical atrial flutter Maintaining sinus rhythm vast majority time.  Has had 1 episode of atrial flutter since the EP study.  She is taking Xarelto for stroke prophylaxis.  We will plan to touch base in about 1 year.  If things become more frequent in the interim, she will reach out and we can always move her appointment sooner.  If she were to have more frequent episodes of tachycardia, could consider initiation of an antiarrhythmic drug.  I have encouraged her to remain active.  #Coronary artery disease No ischemic symptoms today.  Exercising regularly.   Follow-up 1 year.    Medication Adjustments/Labs and Tests Ordered: Current medicines are reviewed at length with the patient today.  Concerns regarding medicines are outlined above.  Orders Placed This Encounter  Procedures   EKG 12-Lead   No orders of  the defined types were placed in this encounter.    Signed, Steffanie Dunn, MD, [facility], [facility] 12/09/2020 9:44 AM    Electrophysiology [facility]

## 2020-12-09 NOTE — Patient Instructions (Signed)
Medication Instructions:  Your physician recommends that you continue on your current medications as directed. Please refer to the Current Medication list given to you today. *If you need a refill on your cardiac medications before your next appointment, please call your pharmacy*  Lab Work: None ordered. If you have labs (blood work) drawn today and your tests are completely normal, you will receive your results only by: MyChart Message (if you have MyChart) OR A paper copy in the mail If you have any lab test that is abnormal or we need to change your treatment, we will call you to review the results.  Testing/Procedures: None ordered.  Follow-Up: At [facility], you and your health needs are our priority.  As part of our continuing mission to provide you with exceptional heart care, we have created designated Provider Care Teams.  These Care Teams include your primary Cardiologist (physician) and Advanced Practice Providers (APPs -  Physician Assistants and Nurse Practitioners) who all work together to provide you with the care you need, when you need it.  Your next appointment:   Your physician wants you to follow-up in: one year with Dr. Lalla Brothers or  You will see one of the following Advanced Practice Providers on your designated Care Team:   Nicolasa Ducking, NP Eula Listen, PA-C Marisue Ivan, PA-C Cadence Fransico Michael, PA-C You will receive a reminder letter in the mail two months in advance. If you don't receive a letter, please call our office to schedule the follow-up appointment.

## 2020-12-14 ENCOUNTER — Telehealth: Payer: Self-pay

## 2020-12-14 NOTE — Progress Notes (Signed)
    Chronic Care Management Pharmacy Assistant   Name: Penny Reed  MRN: [id number] DOB: 02/26/1946  Patient called to be reminded of her appointment with [name], CPP on 12/15/2020 @ 1300 via telephone.  Patient called back to confirm her appointment and stated she had a deep dive with Dr. Steffanie Dunn, MD (Cardiology) on 12/09/2020 and she went over all her medications with him and she was happy with the outcome and she just wanted to know if she needed to still see Alex. I advised that patient that since she is apart of the Chronic Care Management Program, and [name] works closely with Dr. Sullivan Lone she should still keep the telephone visit with [name]. I also informed her I would advise him about the visit so he can take a look at the note prior to their visit tomorrow. Patient was agreeable.   Star Rating Drug: Atorvastatin 80 mg last filled on 09/02/2020 for a 90-Day supply with [facility] Quinapril 40 mg last filled on 10/05/2020 for a 90-Day supply with [facility]  Any gaps in medications fill history? No  Care Gaps: Zoster Vaccines-Shingrix COVID-19 Vaccine Booster 3 Influenza Vaccine (last completed on 09/24/192021)  Adelene Idler, CPA/CMA Clinical Pharmacist Assistant Phone: [phone]

## 2020-12-15 ENCOUNTER — Ambulatory Visit (INDEPENDENT_AMBULATORY_CARE_PROVIDER_SITE_OTHER): Payer: Medicare HMO

## 2020-12-15 DIAGNOSIS — E7800 Pure hypercholesterolemia, unspecified: Secondary | ICD-10-CM | POA: Diagnosis not present

## 2020-12-15 DIAGNOSIS — I484 Atypical atrial flutter: Secondary | ICD-10-CM

## 2020-12-15 NOTE — Patient Instructions (Signed)
Visit Information It was great speaking with you today!  Please let me know if you have any questions about our visit.   Goals Addressed             This Visit's Progress    Track and Manage My Symptoms-COPD       Timeframe:  Long-Range Goal Priority:  High Start Date: 12/15/20                            Expected End Date: 12/15/2021                      Follow Up Date 01/22/2021    - begin a symptom diary - develop a rescue plan - eliminate symptom triggers at home - follow rescue plan if symptoms flare-up    Why is this important?   Tracking your symptoms and other information about your health helps your doctor plan your care.  Write down the symptoms, the time of day, what you were doing and what medicine you are taking.  You will soon learn how to manage your symptoms.     Notes:         Patient Care Plan: General Pharmacy (Adult)     Problem Identified: Hypertension, Hyperlipidemia, Atrial Fibrillation, Coronary Artery Disease, GERD, COPD, Hypothyroidism, Allergic Rhinitis, and Insomnia   Priority: High     Long-Range Goal: Patient-Specific Goal   Start Date: 12/15/2020  Expected End Date: 12/15/2021  This Visit's Progress: On track  Priority: High  Note:   Current Barriers:  Unable to independently afford treatment regimen  Pharmacist Clinical Goal(s):  Patient will verbalize ability to afford treatment regimen through collaboration with PharmD and provider.   Interventions: 1:1 collaboration with Maple Hudson., MD regarding development and update of comprehensive plan of care as evidenced by provider attestation and co-signature Inter-disciplinary care team collaboration (see longitudinal plan of care) Comprehensive medication review performed; medication list updated in electronic medical record  Hypertension (BP goal <140/90) -Controlled -Current treatment: Amlodipine 5 mg daily  Furosemide 40 mg 1/2 tablet daily as needed  Quinapril 40 mg  daily  -Medications previously tried: NA  -Current home readings: Does not routinely monitor -Denies hypotensive/hypertensive symptoms -Counseled to monitor BP at home weekly, document, and provide log at future appointments -Recommended to continue current medication  Hyperlipidemia: (LDL goal < 70) -Controlled -Current treatment: Atorvastatin 80 mg daily  -Medications previously tried: NA  -Recommended to continue current medication  Atrial Flutter (Goal: prevent stroke and major bleeding) -Controlled -CHADSVASC: 4 -Current treatment: Rate control: None Anticoagulation: Xarelto 20 mg daily -Medications previously tried: Eliquis (Swelling) -Recommended to continue current medication   COPD (Goal: control symptoms and prevent exacerbations) -Controlled -Current treatment  Albuterol HFA 2 puffs every 6 hours as needed Anoro 1 puff daily  -Medications previously tried: NA -Exacerbations requiring treatment in last 6 months: None -Patient reports consistent use of maintenance inhaler -Frequency of rescue inhaler use: sporadic -Patient reports cost concerns with Anoro inhaler. -Recommended to continue current medication Assessed patient finances. Will start PAP   Patient Goals/Self-Care Activities Patient will:  - check blood pressure weekly, document, and provide at future appointments  Follow Up Plan: Telephone follow up appointment with care management team member scheduled for:  03/09/2021 at 11:00 AM      Patient agreed to services and verbal consent obtained.   Patient verbalizes understanding of instructions provided today and agrees  to view in MyChart.   [name], PharmD, Patsy [place], CPP  Clinical Pharmacist [facility] [phone]

## 2020-12-15 NOTE — Progress Notes (Signed)
Chronic Care Management Pharmacy Note  12/15/2020 Name:  Penny Reed MRN:  [id number] DOB:  07/07/1945  Summary: Patient presents for CCM follow-up. She is doing ok with Xarelto, but is concerned about the cost of her Xarelto and Anoro medications now that she is in the [facility].   Recommendations/Changes made from today's visit: Continue Current Medications Start PAP  Plan: CPP follow-up 3 months  Subjective: Penny Reed is an 75 y.o. year old female who is a primary patient of Jerrol Banana., MD.  The CCM team was consulted for assistance with disease management and care coordination needs.    Engaged with patient by telephone for follow up visit in response to provider referral for pharmacy case management and/or care coordination services.   Consent to Services:  The patient was given information about Chronic Care Management services, agreed to services, and gave verbal consent prior to initiation of services.  Please see initial visit note for detailed documentation.   Patient Care Team: Jerrol Banana., MD as PCP - General (Family Medicine) Vickie Epley, MD as PCP - Electrophysiology (Cardiology) Idelle Leech, OD as Consulting Physician (Optometry) Yolonda Kida, MD as Consulting Physician (Cardiology) Gabriel Carina Betsey Holiday, MD as Physician Assistant (Endocrinology) Vickii Penna, DMD (Dentistry) Germaine Pomfret, [facility] (Pharmacist)  Recent office visits: 09/16/20: Patient presented to Dr. Rosanna Randy for follow-up. No medication changes noted.   Recent consult visits: 12/09/20: Patient presented to Dr. Quentin Ore (Cardiology) for follow-up. No medication changes made.   Hospital visits: 09/04/20: Patient hospitalzied for SVT ablation. Patient with a-flutter, started on Eliquis. Switched to Xarelto after allergy.    Objective:  Lab Results  Component Value Date   CREATININE 0.85 08/11/2020   BUN 13 08/11/2020   GFRNONAA 71 05/29/2020    GFRAA 81 05/29/2020   NA 139 08/11/2020   K 4.2 08/11/2020   CALCIUM 10.2 08/11/2020   CO2 23 08/11/2020   GLUCOSE 122 (H) 08/11/2020    No results found for: HGBA1C, FRUCTOSAMINE, GFR, MICROALBUR  Last diabetic Eye exam: No results found for: HMDIABEYEEXA  Last diabetic Foot exam: No results found for: HMDIABFOOTEX   Lab Results  Component Value Date   CHOL 184 09/05/2018   HDL 89 09/05/2018   LDLCALC 80 09/05/2018   TRIG 77 09/05/2018   CHOLHDL 2.1 09/05/2018    Hepatic Function Latest Ref Rng & Units 09/05/2018 08/28/2017 07/12/2017  Total Protein 6.0 - 8.5 g/dL 7.1 7.1 7.1  Albumin 3.7 - 4.7 g/dL 4.5 4.5 4.2  AST 0 - 40 IU/L 38 27 31  ALT 0 - 32 IU/L - 20 24  Alk Phosphatase 39 - 117 IU/L 77 90 95  Total Bilirubin 0.0 - 1.2 mg/dL 1.0 0.6 0.8    Lab Results  Component Value Date/Time   TSH 5.740 (H) 09/05/2018 11:49 AM   TSH 0.027 (L) 08/28/2017 12:23 PM    CBC Latest Ref Rng & Units 08/11/2020 07/23/2019 09/05/2018  WBC 3.4 - 10.8 x10E3/uL 6.4 8.9 5.3  Hemoglobin 11.1 - 15.9 g/dL 14.0 14.1 13.6  Hematocrit 34.0 - 46.6 % 41.6 42.3 40.7  Platelets 150 - 450 x10E3/uL 198 181 174    No results found for: VD25OH  Clinical ASCVD: Yes  The ASCVD Risk score Mikey Bussing DC Jr., et al., 2013) failed to calculate for the following reasons:   The patient has a prior MI or stroke diagnosis    Depression screen [facility] 2/9 09/16/2020 09/04/2019 09/05/2018  Decreased Interest 0 0 0  Down, Depressed, Hopeless 0 0 0  PHQ - 2 Score 0 0 0  Altered sleeping 0 - 1  Tired, decreased energy 0 - 0  Change in appetite 0 - 0  Feeling bad or failure about yourself  0 - 0  Trouble concentrating 0 - 0  Moving slowly or fidgety/restless 0 - 0  Suicidal thoughts 0 - 0  PHQ-9 Score 0 - 1  Difficult doing work/chores Not difficult at all - -    Social History   Tobacco Use  Smoking Status Former   Packs/day: 0.25   Years: 25.00   Pack years: 6.25   Types: Cigarettes   Quit date: 04/25/2005    Years since quitting: 15.6  Smokeless Tobacco Never   BP Readings from Last 3 Encounters:  12/09/20 128/78  09/16/20 (!) 160/60  09/16/20 138/62   Pulse Readings from Last 3 Encounters:  12/09/20 (!) 52  09/16/20 (!) 48  09/16/20 (!) 46   Wt Readings from Last 3 Encounters:  12/09/20 176 lb 12.8 oz (80.2 kg)  09/16/20 184 lb (83.5 kg)  09/16/20 184 lb (83.5 kg)   BMI Readings from Last 3 Encounters:  12/09/20 27.69 kg/m  09/16/20 28.82 kg/m  09/16/20 28.82 kg/m    Assessment/Interventions: Review of patient past medical history, allergies, medications, health status, including review of consultants reports, laboratory and other test data, was performed as part of comprehensive evaluation and provision of chronic care management services.   SDOH:  (Social Determinants of Health) assessments and interventions performed: Yes SDOH Interventions    Flowsheet Row Most Recent Value  SDOH Interventions   Financial Strain Interventions Other (Comment)  [PAP]      SDOH Screenings   Alcohol Screen: Low Risk    Last Alcohol Screening Score (AUDIT): 4  Depression (PHQ2-9): Low Risk    PHQ-2 Score: 0  Financial Resource Strain: High Risk   Difficulty of Paying Living Expenses: Hard  Food Insecurity: Not on file  Housing: Not on file  Physical Activity: Not on file  Social Connections: Not on file  Stress: Not on file  Tobacco Use: Medium Risk   Smoking Tobacco Use: Former   Smokeless Tobacco Use: Never  Transportation Needs: No Transportation Needs   Lack of Transportation (Medical): No   Lack of Transportation (Non-Medical): No    CCM Care Plan  Allergies  Allergen Reactions   Adhesive [Tape]     Band-aids irritate skin   Azithromycin Diarrhea and Nausea And Vomiting   Eliquis [Apixaban] Swelling    Leg and ankle swelling    Medications Reviewed Today     Reviewed by Juventino Slovak, CMA (Certified Medical Assistant) on 12/09/20 at [phone]  Med List Status:  <None>   Medication Order Taking? Sig Documenting Provider Last Dose Status Informant  albuterol (PROAIR HFA) 108 (90 BASE) MCG/ACT inhaler [id number] Yes Inhale 2 puffs into the lungs every 6 (six) hours as needed for wheezing or shortness of breath. Jerrol Banana., MD Taking Active Self  amLODipine [facility]) 5 MG tablet [id number] Yes TAKE 1 TABLET BY MOUTH EVERY DAY Jerrol Banana., MD Taking Active   [place] ELLIPTA 62.5-25 MCG/INH AEPB [id number] Yes INHALE 1 PUFF BY MOUTH EVERY DAY Jerrol Banana., MD Taking Active   atorvastatin (LIPITOR) 80 MG tablet [id number] Yes TAKE 1 TABLET BY MOUTH EVERY DAY Jerrol Banana., MD Taking Active   azelastine (ASTELIN) 0.1 % nasal spray  [id number] Yes SPRAY 2 SPRAYS INTO EACH NOSTRIL EVERY DAY Jerrol Banana., MD Taking Active   Calcium Carbonate (CALCIUM 600 PO) [id number] Yes Take 600 mg by mouth in the morning and at bedtime. [provider] Taking Active Self  cetirizine (ZYRTEC) 10 MG tablet [id number] Yes Take 10 mg by mouth daily. [provider] Taking Active Self  cholecalciferol (VITAMIN D3) 25 MCG (1000 UNIT) tablet [id number] Yes Take 1,000 Units by mouth daily. [provider] Taking Active Self  furosemide (LASIX) 40 MG tablet [id number] Yes Take 20 mg by mouth daily as needed for edema. [provider] Taking Active Self  levothyroxine (SYNTHROID) 112 MCG tablet [id number] Yes TAKE ONE TABLET BY MOUTH SIX DAYS PER WEEK. TAKE 1 AND 1/2 TABLET BY MOUTH ONE DAY PER WEEK. Jerrol Banana., MD Taking Active   Multiple Vitamin (MULTIVITAMIN) tablet [id number] Yes Take 1 tablet by mouth daily. Standard Process [provider] Taking Active Self  nitroGLYCERIN (NITROSTAT) 0.4 MG SL tablet [id number] Yes Place 0.4 mg under the tongue every 5 (five) minutes as needed for chest pain.  [provider] Taking Active Self  Omega-3 Fatty Acids (FISH OIL) 1000 MG CAPS [id number]  Yes Take 3,000 mg by mouth daily. [provider] Taking Active Self  psyllium (REGULOID) 0.52 g capsule [id number]  Take 2.6 g by mouth daily. 5 Fiber capsules [provider]  Active Self  quinapril (ACCUPRIL) 40 MG tablet [id number] Yes TAKE 1 TABLET BY MOUTH EVERY DAY Jerrol Banana., MD Taking Active   rivaroxaban (XARELTO) 20 MG TABS tablet [id number] Yes Take 1 tablet (20 mg total) by mouth daily with supper. Vickie Epley, MD Taking Active   zolpidem [facility]) 5 MG tablet [id number] Yes TAKE 1 TABLET BY MOUTH AT BEDTIME AS NEEDED FOR SLEEP. Jerrol Banana., MD Taking Active             Patient Active Problem List   Diagnosis Date Noted   Ascending aortic aneurysm ([place]) 07/23/2019   Insomnia 01/26/2018   Angina pectoris ([place]) 12/09/2014   BP (high blood pressure) 12/09/2014   Adiposity 12/09/2014   Dyspnea 09/25/2014   Abnormal respiratory rate 09/25/2014   Acquired hypothyroidism 09/21/2014   Allergic rhinitis 09/21/2014   Arteriosclerosis of coronary artery 09/21/2014   Essential (primary) hypertension 09/21/2014   HLD (hyperlipidemia) 09/21/2014   Cardiomegaly 09/21/2014   Familial multiple lipoprotein-type hyperlipidemia 09/21/2014   CAFL (chronic airflow limitation) (HCC) 08/06/2013   Acid reflux 08/06/2013   Breath shortness 08/06/2013   Disease of thyroid gland 08/06/2013   COPD, moderate ([place]) 05/24/2013    Immunization History  Administered Date(s) Administered   Fluad Quad(high Dose 65+) 01/11/2019   Influenza, High Dose Seasonal PF 02/23/2017, 01/02/2018   Influenza,inj,Quad PF,6+ Mos 03/20/2013   Influenza-Unspecified 12/24/2013, 12/24/2013, 01/24/2015   PFIZER(Purple Top)SARS-COV-2 Vaccination 06/18/2019, 07/09/2019   Pneumococcal Conjugate-13 03/20/2013   Pneumococcal Polysaccharide-23 04/02/2014   Pneumococcal-Unspecified 03/24/2013   Td 07/10/2003   Tdap 01/02/2018    Conditions to be addressed/monitored:   Hypertension, Hyperlipidemia, Atrial Fibrillation, Coronary Artery Disease, GERD, COPD, Hypothyroidism, Allergic Rhinitis, and Insomnia  Care Plan : General Pharmacy (Adult)  Updates made by Germaine Pomfret, RPH since 12/15/2020 12:00 AM     Problem: Hypertension, Hyperlipidemia, Atrial Fibrillation, Coronary Artery Disease, GERD, COPD, Hypothyroidism, Allergic Rhinitis, and Insomnia   Priority: High     Long-Range Goal: Patient-Specific Goal   Start Date: 12/15/2020  Expected End Date: 12/15/2021  This Visit's Progress: On track  Priority: High  Note:   Current Barriers:  Unable to independently afford treatment regimen  Pharmacist Clinical Goal(s):  Patient will verbalize ability to afford treatment regimen through collaboration with PharmD and provider.   Interventions: 1:1 collaboration with Jerrol Banana., MD regarding development and update of comprehensive plan of care as evidenced by provider attestation and co-signature Inter-disciplinary care team collaboration (see longitudinal plan of care) Comprehensive medication review performed; medication list updated in electronic medical record  Hypertension (BP goal <140/90) -Controlled -Current treatment: Amlodipine 5 mg daily  Furosemide 40 mg 1/2 tablet daily as needed  Quinapril 40 mg daily  -Medications previously tried: NA  -Current home readings: Does not routinely monitor -Denies hypotensive/hypertensive symptoms -Counseled to monitor BP at home weekly, document, and provide log at future appointments -Recommended to continue current medication  Hyperlipidemia: (LDL goal < 70) -Controlled -Current treatment: Atorvastatin 80 mg daily  -Medications previously tried: NA  -Recommended to continue current medication  Atrial Flutter (Goal: prevent stroke and major bleeding) -Controlled -CHADSVASC: 4 -Current treatment: Rate control: None Anticoagulation: Xarelto 20 mg daily -Medications previously  tried: Eliquis (Swelling) -Recommended to continue current medication   COPD (Goal: control symptoms and prevent exacerbations) -Controlled -Current treatment  Albuterol HFA 2 puffs every 6 hours as needed Anoro 1 puff daily  -Medications previously tried: NA -Exacerbations requiring treatment in last 6 months: None -Patient reports consistent use of maintenance inhaler -Frequency of rescue inhaler use: sporadic -Patient reports cost concerns with Anoro inhaler. -Recommended to continue current medication Assessed patient finances. Will start PAP   Patient Goals/Self-Care Activities Patient will:  - check blood pressure weekly, document, and provide at future appointments  Follow Up Plan: Telephone follow up appointment with care management team member scheduled for:  03/09/2021 at 11:00 AM      Medication Assistance: Application for Anoro Ellipta  medication assistance program. in process.  Anticipated assistance start date TBD.  See plan of care for additional detail.  Compliance/Adherence/Medication fill history: Care Gaps: Shingrix Covid Vaccine Influenza   Star-Rating Drugs: Atorvastatin 80 mg: Last filled 09/02/20 for 90-DS  Patient's preferred pharmacy is:  [facility] [address], [place] [address] Phone: [phone] Fax: [place], [place] [address] [address] Phone: [phone] Fax: [phone]  [id number] [place] [place] [address] [id number]: [phone]: [phone] Uses pill box? Yes Pt endorses 100% compliance  We discussed: Current pharmacy is preferred with insurance plan and patient is satisfied with pharmacy services Patient decided to: Continue current medication management strategy  Care Plan and Follow Up Patient Decision:  Patient agrees to Care Plan  and Follow-up.  Plan: Telephone follow up appointment with care management team member scheduled for:  03/09/2021 at 11:00 AM  AJunius Argyle PharmD, BPara [place] [phone]

## 2020-12-16 ENCOUNTER — Telehealth: Payer: Self-pay

## 2020-12-16 NOTE — Progress Notes (Signed)
    Chronic Care Management Pharmacy Assistant   Name: Penny Reed  MRN: [id number] DOB: [birth year]  Received a task from [name], CPP requesting that I complete a patient assistance application for this patient for the medication Anoro Ellipta.  Application filled out for patient and emailed to [employer], CPP. Spoke with patient and she advised that she would prefer that [employer] the application to her address for her to complete. Message has been sent to [name], CPP informing him of the patient's request.   All excel sheet's have been updated regarding all information.    Medications: Outpatient Encounter Medications as of 12/16/2020  Medication Sig   albuterol (PROAIR HFA) 108 (90 BASE) MCG/ACT inhaler Inhale 2 puffs into the lungs every 6 (six) hours as needed for wheezing or shortness of breath.   amLODipine (NORVASC) 5 MG tablet TAKE 1 TABLET BY MOUTH EVERY DAY   ANORO ELLIPTA 62.5-25 MCG/INH AEPB INHALE 1 PUFF BY MOUTH EVERY DAY   atorvastatin (LIPITOR) 80 MG tablet TAKE 1 TABLET BY MOUTH EVERY DAY   azelastine (ASTELIN) 0.1 % nasal spray SPRAY 2 SPRAYS INTO EACH NOSTRIL EVERY DAY   Calcium Carbonate (CALCIUM 600 PO) Take 600 mg by mouth in the morning and at bedtime.   cetirizine (ZYRTEC) 10 MG tablet Take 10 mg by mouth daily.   cholecalciferol (VITAMIN D3) 25 MCG (1000 UNIT) tablet Take 1,000 Units by mouth daily.   furosemide (LASIX) 40 MG tablet Take 20 mg by mouth daily as needed for edema.   levothyroxine (SYNTHROID) 112 MCG tablet TAKE ONE TABLET BY MOUTH SIX DAYS PER WEEK. TAKE 1 AND 1/2 TABLET BY MOUTH ONE DAY PER WEEK.   Multiple Vitamin (MULTIVITAMIN) tablet Take 1 tablet by mouth daily. Standard Process   nitroGLYCERIN (NITROSTAT) 0.4 MG SL tablet Place 0.4 mg under the tongue every 5 (five) minutes as needed for chest pain.    Omega-3 Fatty Acids (FISH OIL) 1000 MG CAPS Take 3,000 mg by mouth daily.   psyllium (REGULOID) 0.52 g capsule Take 2.6 g by  mouth daily. 5 Fiber capsules   quinapril (ACCUPRIL) 40 MG tablet TAKE 1 TABLET BY MOUTH EVERY DAY   rivaroxaban (XARELTO) 20 MG TABS tablet Take 1 tablet (20 mg total) by mouth daily with supper.   zolpidem (AMBIEN) 5 MG tablet TAKE 1 TABLET BY MOUTH AT BEDTIME AS NEEDED FOR SLEEP.   No facility-administered encounter medications on file as of 12/16/2020.   Adelene Idler, CPA/CMA Clinical Pharmacist Assistant Phone: [phone]

## 2020-12-22 ENCOUNTER — Other Ambulatory Visit: Payer: Self-pay | Admitting: Cardiology

## 2020-12-22 NOTE — Telephone Encounter (Signed)
Prescription refill request for Xarelto received.  Last office visit:[place] 12/09/20 Weight:80.2kg Age:74f Scr: 0.85 08/11/20 CrCl:87.8

## 2020-12-24 ENCOUNTER — Other Ambulatory Visit: Payer: Self-pay | Admitting: Family Medicine

## 2020-12-24 DIAGNOSIS — J309 Allergic rhinitis, unspecified: Secondary | ICD-10-CM

## 2021-02-22 ENCOUNTER — Ambulatory Visit: Payer: Self-pay | Admitting: *Deleted

## 2021-02-22 DIAGNOSIS — U071 COVID-19: Secondary | ICD-10-CM

## 2021-02-22 MED ORDER — MOLNUPIRAVIR EUA 200MG CAPSULE: 4.0000 | ORAL_CAPSULE | Freq: Two times a day (BID) | ORAL | 0 refills | Status: DC

## 2021-02-22 MED ORDER — MOLNUPIRAVIR EUA 200MG CAPSULE: 4.0000 | ORAL_CAPSULE | Freq: Two times a day (BID) | ORAL | 0 refills | Status: AC

## 2021-02-22 NOTE — Telephone Encounter (Signed)
Patient experiencing congestion, scratchy eyes and body ache and no fever for 2 days.Patient COVID test was inconclusive   Pt reports home covid test this AM, "Pale pink line, not sure if positive."  States husband tested positive last week. Advised with symptoms, any pink line showing would indicate positive. Reports symptoms onset last evening. Reports sore throat, slight headache, dry cough, chills, body aches. States afebrile. Denies any SOB, no wheezing. H/O COPD. HAs used daily Anoro, has not  had to use ProAir as of yet. Home care advise given and assured pt NT would route to practice for PCPs review. Pt has active MYChart.  Reviewed self isolation guidelines and symptoms that warrant an ED eval. Pt verbalizes understanding. Pt is interested in oral anti viral agent, states husband took Molnupiravir. Sent HP as pt is high risk. CB# [id number]    Reason for Disposition  [1] HIGH RISK for severe COVID complications (e.g., weak immune system, age > 64 years, obesity with BMI > 25, pregnant, chronic lung disease or other chronic medical condition) AND [2] COVID symptoms (e.g., cough, fever)  (Exceptions: Already seen by PCP and no new or worsening symptoms.)  Answer Assessment - Initial Assessment Questions 1. COVID-19 DIAGNOSIS: "Who made your COVID-19 diagnosis?" "Was it confirmed by a positive lab test or self-test?" If not diagnosed by a doctor (or NP/PA), ask "Are there lots of cases (community spread) where you live?" Note: See public health department website, if unsure.     Home test 2. COVID-19 EXPOSURE: "Was there any known exposure to COVID before the symptoms began?" CDC Definition of close contact: within 6 feet (2 meters) for a total of 15 minutes or more over a 24-hour period.      Yes, husband 3. ONSET: "When did the COVID-19 symptoms start?"      Yesterday afternoon 4. WORST SYMPTOM: "What is your worst symptom?" (e.g., cough, fever, shortness of breath, muscle aches)      Body aches and chills 5. COUGH: "Do you have a cough?" If Yes, ask: "How bad is the cough?"       Yes, dry cough 6. FEVER: "Do you have a fever?" If Yes, ask: "What is your temperature, how was it measured, and when did it start?"     No but chills 7. RESPIRATORY STATUS: "Describe your breathing?" (e.g., shortness of breath, wheezing, unable to speak)      no 8. BETTER-SAME-WORSE: "Are you getting better, staying the same or getting worse compared to yesterday?"  If getting worse, ask, "In what way?"     worse 9. HIGH RISK DISEASE: "Do you have any chronic medical problems?" (e.g., asthma, heart or lung disease, weak immune system, obesity, etc.)      10. VACCINE: "Have you had the COVID-19 vaccine?" If Yes, ask: "Which one, how many shots, when did you get it?"       2 vaccines, Pfizer 11. BOOSTER: "Have you received your COVID-19 booster?" If Yes, ask: "Which one and when did you get it?"       no  13. OTHER SYMPTOMS: "Do you have any other symptoms?"  (e.g., chills, fatigue, headache, loss of smell or taste, muscle pain, sore throat)       Sore throat, body aches,slight headache 14. O2 SATURATION MONITOR:  "Do you use an oxygen saturation monitor (pulse oximeter) at home?" If Yes, ask "What is your reading (oxygen level) today?" "What is your usual oxygen saturation reading?" (e.g., 95%)       *  No Answer*  Protocols used: Coronavirus ([id number]) Diagnosed or Suspected-A-AH

## 2021-02-22 NOTE — Telephone Encounter (Signed)
I have an order for molnupiravir pending in this encounter. Please review prescription dosing, instructions and quantity for accuracy.

## 2021-02-22 NOTE — Telephone Encounter (Signed)
Pt called back concerned as she saw triage note in MyCHart "Then it disappeared." Assured pt note available and routed to practice at 0912. Pt requesting CB.

## 2021-03-08 ENCOUNTER — Telehealth: Payer: Self-pay

## 2021-03-08 NOTE — Progress Notes (Signed)
    Chronic Care Management Pharmacy Assistant   Name: Penny Reed  MRN: [id number] DOB: 05-Oct-1945  Patient called to reminded of her telephone appointment with [name], CPP on 11/15 @ 1100  Patient aware of appointment date, time, and type of appointment (either telephone or in person). Patient aware to have/bring all medications, supplements, blood pressure and/or blood sugar logs to visit.  Questions: Are there any concerns you would like to discuss during your office visit? Not at this time  Are you having any problems obtaining your medications? Possible Inhaler Anoro Ellipta as she never turned in the application for patient assistance due to she never meet the OOP amount that is required by [employer]  Star Rating Drug: Atorvastatin 80 mg last filled on 12/24/2020 for a 90-Day supply with [facility] Quinapril 40 mg last filled on 12/30/2020 for a 90-Day supply with [facility]  Any gaps in medications fill history? No  Care Gaps: Zoster Vaccine COVID-19 Vaccine Influenza Vaccine   Adelene Idler, CPA/CMA Clinical Pharmacist Assistant Phone: [phone]

## 2021-03-09 ENCOUNTER — Ambulatory Visit (INDEPENDENT_AMBULATORY_CARE_PROVIDER_SITE_OTHER): Payer: Medicare HMO

## 2021-03-09 DIAGNOSIS — E7800 Pure hypercholesterolemia, unspecified: Secondary | ICD-10-CM

## 2021-03-09 DIAGNOSIS — J449 Chronic obstructive pulmonary disease, unspecified: Secondary | ICD-10-CM

## 2021-03-09 NOTE — Progress Notes (Signed)
Chronic Care Management Pharmacy Note  03/16/2021 Name:  NASHA DISS MRN:  [id number] DOB:  [birth year]  Summary: Patient presents for CCM follow-up. She is doing ok with Xarelto, but is concerned about the cost of her Xarelto and Anoro medications now that she is in the [facility].   Recommendations/Changes made from today's visit: Continue Current Medications Start PAP  Plan: CPP follow-up 3 months  Subjective: SHALISHA CLAUSING is an 75 y.o. year old female who is a primary patient of Jerrol Banana., MD.  The CCM team was consulted for assistance with disease management and care coordination needs.    Engaged with patient by telephone for follow up visit in response to provider referral for pharmacy case management and/or care coordination services.   Consent to Services:  The patient was given information about Chronic Care Management services, agreed to services, and gave verbal consent prior to initiation of services.  Please see initial visit note for detailed documentation.   Patient Care Team: Jerrol Banana., MD as PCP - General (Family Medicine) Vickie Epley, MD as PCP - Electrophysiology (Cardiology) Idelle Leech, OD as Consulting Physician (Optometry) Yolonda Kida, MD as Consulting Physician (Cardiology) Gabriel Carina Betsey Holiday, MD as Physician Assistant (Endocrinology) Vickii Penna, DMD (Dentistry) Germaine Pomfret, V [facility] (Pharmacist)  Recent office visits: 09/16/20: Patient presented to Dr. Rosanna Randy for follow-up. No medication changes noted.   Recent consult visits: 12/09/20: Patient presented to Dr. Quentin Ore (Cardiology) for follow-up. No medication changes made.   Hospital visits: 09/04/20: Patient hospitalzied for SVT ablation. Patient with a-flutter, started on Eliquis. Switched to Xarelto after allergy.    Objective:  Lab Results  Component Value Date   CREATININE 0.85 08/11/2020   BUN 13 08/11/2020   GFRNONAA 71 05/29/2020    GFRAA 81 05/29/2020   NA 139 08/11/2020   K 4.2 08/11/2020   CALCIUM 10.2 08/11/2020   CO2 23 08/11/2020   GLUCOSE 122 (H) 08/11/2020    No results found for: HGBA1C, FRUCTOSAMINE, GFR, MICROALBUR  Last diabetic Eye exam: No results found for: HMDIABEYEEXA  Last diabetic Foot exam: No results found for: HMDIABFOOTEX   Lab Results  Component Value Date   CHOL 184 09/05/2018   HDL 89 09/05/2018   LDLCALC 80 09/05/2018   TRIG 77 09/05/2018   CHOLHDL 2.1 09/05/2018    Hepatic Function Latest Ref Rng & Units 09/05/2018 08/28/2017 07/12/2017  Total Protein 6.0 - 8.5 g/dL 7.1 7.1 7.1  Albumin 3.7 - 4.7 g/dL 4.5 4.5 4.2  AST 0 - 40 IU/L 38 27 31  ALT 0 - 32 IU/L - 20 24  Alk Phosphatase 39 - 117 IU/L 77 90 95  Total Bilirubin 0.0 - 1.2 mg/dL 1.0 0.6 0.8    Lab Results  Component Value Date/Time   TSH 5.740 (H) 09/05/2018 11:49 AM   TSH 0.027 (L) 08/28/2017 12:23 PM    CBC Latest Ref Rng & Units 08/11/2020 07/23/2019 09/05/2018  WBC 3.4 - 10.8 x10E3/uL 6.4 8.9 5.3  Hemoglobin 11.1 - 15.9 g/dL 14.0 14.1 13.6  Hematocrit 34.0 - 46.6 % 41.6 42.3 40.7  Platelets 150 - 450 x10E3/uL 198 181 174    No results found for: VD25OH  Clinical ASCVD: Yes  The ASCVD Risk score (Arnett DK, et al., 2019) failed to calculate for the following reasons:   The patient has a prior MI or stroke diagnosis    Depression screen [facility] 2/9 09/16/2020 09/04/2019 09/05/2018  Decreased Interest 0 0 0  Down, Depressed, Hopeless 0 0 0  PHQ - 2 Score 0 0 0  Altered sleeping 0 - 1  Tired, decreased energy 0 - 0  Change in appetite 0 - 0  Feeling bad or failure about yourself  0 - 0  Trouble concentrating 0 - 0  Moving slowly or fidgety/restless 0 - 0  Suicidal thoughts 0 - 0  PHQ-9 Score 0 - 1  Difficult doing work/chores Not difficult at all - -    Social History   Tobacco Use  Smoking Status Former   Packs/day: 0.25   Years: 25.00   Pack years: 6.25   Types: Cigarettes   Quit date: 04/25/2005    Years since quitting: 15.9  Smokeless Tobacco Never   BP Readings from Last 3 Encounters:  12/09/20 128/78  09/16/20 (!) 160/60  09/16/20 138/62   Pulse Readings from Last 3 Encounters:  12/09/20 (!) 52  09/16/20 (!) 48  09/16/20 (!) 46   Wt Readings from Last 3 Encounters:  12/09/20 176 lb 12.8 oz (80.2 kg)  09/16/20 184 lb (83.5 kg)  09/16/20 184 lb (83.5 kg)   BMI Readings from Last 3 Encounters:  12/09/20 27.69 kg/m  09/16/20 28.82 kg/m  09/16/20 28.82 kg/m    Assessment/Interventions: Review of patient past medical history, allergies, medications, health status, including review of consultants reports, laboratory and other test data, was performed as part of comprehensive evaluation and provision of chronic care management services.   SDOH:  (Social Determinants of Health) assessments and interventions performed: Yes SDOH Interventions    Flowsheet Row Most Recent Value  SDOH Interventions   Financial Strain Interventions Intervention Not Indicated       SDOH Screenings   Alcohol Screen: Low Risk    Last Alcohol Screening Score (AUDIT): 4  Depression (PHQ2-9): Low Risk    PHQ-2 Score: 0  Financial Resource Strain: Low Risk    Difficulty of Paying Living Expenses: Not hard at all  Food Insecurity: Not on file  Housing: Not on file  Physical Activity: Not on file  Social Connections: Not on file  Stress: Not on file  Tobacco Use: Medium Risk   Smoking Tobacco Use: Former   Smokeless Tobacco Use: Never   Passive Exposure: Not on file  Transportation Needs: No Transportation Needs   Lack of Transportation (Medical): No   Lack of Transportation (Non-Medical): No    CCM Care Plan  Allergies  Allergen Reactions   Adhesive [Tape]     Band-aids irritate skin   Azithromycin Diarrhea and Nausea And Vomiting   Eliquis [Apixaban] Swelling    Leg and ankle swelling    Medications Reviewed Today     Reviewed by Juventino Slovak, CMA (Certified Medical  Assistant) on 12/09/20 at [phone]  Med List Status: <None>   Medication Order Taking? Sig Documenting Provider Last Dose Status Informant  albuterol (PROAIR HFA) 108 (90 BASE) MCG/ACT inhaler [id number] Yes Inhale 2 puffs into the lungs every 6 (six) hours as needed for wheezing or shortness of breath. Jerrol Banana., MD Taking Active Self  amLODipine [facility]) 5 MG tablet [id number] Yes TAKE 1 TABLET BY MOUTH EVERY DAY Jerrol Banana., MD Taking Active   [place] ELLIPTA 62.5-25 MCG/INH AEPB [id number] Yes INHALE 1 PUFF BY MOUTH EVERY DAY Jerrol Banana., MD Taking Active   atorvastatin (LIPITOR) 80 MG tablet [id number] Yes TAKE 1 TABLET BY MOUTH EVERY DAY Jerrol Banana.,  MD Taking Active   azelastine (ASTELIN) 0.1 % nasal spray [id number] Yes SPRAY 2 SPRAYS INTO EACH NOSTRIL EVERY DAY Jerrol Banana., MD Taking Active   Calcium Carbonate (CALCIUM 600 PO) [id number] Yes Take 600 mg by mouth in the morning and at bedtime. [provider] Taking Active Self  cetirizine (ZYRTEC) 10 MG tablet [id number] Yes Take 10 mg by mouth daily. [provider] Taking Active Self  cholecalciferol (VITAMIN D3) 25 MCG (1000 UNIT) tablet [id number] Yes Take 1,000 Units by mouth daily. [provider] Taking Active Self  furosemide (LASIX) 40 MG tablet [id number] Yes Take 20 mg by mouth daily as needed for edema. [provider] Taking Active Self  levothyroxine (SYNTHROID) 112 MCG tablet [id number] Yes TAKE ONE TABLET BY MOUTH SIX DAYS PER WEEK. TAKE 1 AND 1/2 TABLET BY MOUTH ONE DAY PER WEEK. Jerrol Banana., MD Taking Active   Multiple Vitamin (MULTIVITAMIN) tablet [id number] Yes Take 1 tablet by mouth daily. Standard Process [provider] Taking Active Self  nitroGLYCERIN (NITROSTAT) 0.4 MG SL tablet [id number] Yes Place 0.4 mg under the tongue every 5 (five) minutes as needed for chest pain.  [provider] Taking Active Self   Omega-3 Fatty Acids (FISH OIL) 1000 MG CAPS [id number] Yes Take 3,000 mg by mouth daily. [provider] Taking Active Self  psyllium (REGULOID) 0.52 g capsule [id number]  Take 2.6 g by mouth daily. 5 Fiber capsules [provider]  Active Self  quinapril (ACCUPRIL) 40 MG tablet [id number] Yes TAKE 1 TABLET BY MOUTH EVERY DAY Jerrol Banana., MD Taking Active   rivaroxaban (XARELTO) 20 MG TABS tablet [id number] Yes Take 1 tablet (20 mg total) by mouth daily with supper. Vickie Epley, MD Taking Active   zolpidem [facility]) 5 MG tablet [id number] Yes TAKE 1 TABLET BY MOUTH AT BEDTIME AS NEEDED FOR SLEEP. Jerrol Banana., MD Taking Active             Patient Active Problem List   Diagnosis Date Noted   Ascending aortic aneurysm 07/23/2019   Insomnia 01/26/2018   Angina pectoris ([place]) 12/09/2014   BP (high blood pressure) 12/09/2014   Adiposity 12/09/2014   Dyspnea 09/25/2014   Abnormal respiratory rate 09/25/2014   Acquired hypothyroidism 09/21/2014   Allergic rhinitis 09/21/2014   Arteriosclerosis of coronary artery 09/21/2014   Essential (primary) hypertension 09/21/2014   HLD (hyperlipidemia) 09/21/2014   Cardiomegaly 09/21/2014   Familial multiple lipoprotein-type hyperlipidemia 09/21/2014   CAFL (chronic airflow limitation) (HCC) 08/06/2013   Acid reflux 08/06/2013   Breath shortness 08/06/2013   Disease of thyroid gland 08/06/2013   COPD, moderate ([place]) 05/24/2013    Immunization History  Administered Date(s) Administered   Fluad Quad(high Dose 65+) 01/11/2019   Influenza, High Dose Seasonal PF 02/23/2017, 01/02/2018   Influenza,inj,Quad PF,6+ Mos 03/20/2013   Influenza-Unspecified 12/24/2013, 12/24/2013, 01/24/2015   PFIZER(Purple Top)SARS-COV-2 Vaccination 06/18/2019, 07/09/2019   Pneumococcal Conjugate-13 03/20/2013   Pneumococcal Polysaccharide-23 04/02/2014   Pneumococcal-Unspecified 03/24/2013   Td 07/10/2003   Tdap 01/02/2018     Conditions to be addressed/monitored:  Hypertension, Hyperlipidemia, Atrial Fibrillation, Coronary Artery Disease, GERD, COPD, Hypothyroidism, Allergic Rhinitis, and Insomnia  Care Plan : General Pharmacy (Adult)  Updates made by Germaine Pomfret, RPH since 03/16/2021 12:00 AM     Problem: Hypertension, Hyperlipidemia, Atrial Fibrillation, Coronary Artery Disease, GERD, COPD, Hypothyroidism, Allergic Rhinitis, and Insomnia   Priority: High     Long-Range Goal: Patient-Specific Goal  Start Date: 12/15/2020  Expected End Date: 12/15/2021  This Visit's Progress: On track  Recent Progress: On track  Priority: High  Note:   Current Barriers:  Unable to independently afford treatment regimen  Pharmacist Clinical Goal(s):  Patient will verbalize ability to afford treatment regimen through collaboration with PharmD and provider.   Interventions: 1:1 collaboration with Jerrol Banana., MD regarding development and update of comprehensive plan of care as evidenced by provider attestation and co-signature Inter-disciplinary care team collaboration (see longitudinal plan of care) Comprehensive medication review performed; medication list updated in electronic medical record  Hypertension (BP goal <140/90) -Controlled -Current treatment: Amlodipine 5 mg daily  Furosemide 40 mg 1/2 tablet daily as needed  Quinapril 40 mg daily  -Medications previously tried: NA  -Current home readings: 120/80 -Denies hypotensive/hypertensive symptoms -Counseled to monitor BP at home weekly, document, and provide log at future appointments -Recommended to continue current medication  Hyperlipidemia: (LDL goal < 70) -Controlled -Current treatment: Atorvastatin 80 mg daily  -Medications previously tried: NA  -Recommended to continue current medication  Atrial Flutter (Goal: prevent stroke and major bleeding) -Controlled -CHADSVASC: 4 -Current treatment: Rate control:  None Anticoagulation: Xarelto 20 mg daily -Medications previously tried: Eliquis (Swelling) -Recommended to continue current medication   COPD (Goal: control symptoms and prevent exacerbations) -Controlled -Current treatment  Albuterol HFA 2 puffs every 6 hours as needed Anoro 1 puff daily  -Medications previously tried: NA -Lingering cough, but no worsening shortness of breath.  -Exacerbations requiring treatment in last 6 months: None -Patient reports consistent use of maintenance inhaler -Frequency of rescue inhaler use: sporadic -Patient reports cost concerns with Anoro inhaler. -Recommended to continue current medication Assessed patient finances. Will start PAP   Patient Goals/Self-Care Activities Patient will:  - check blood pressure weekly, document, and provide at future appointments  Follow Up Plan: Telephone follow up appointment with care management team member scheduled for:  10/05/2021 at 3:45 PM       Medication Assistance: Application for Anoro Ellipta  medication assistance program. in process.  Anticipated assistance start date TBD.  See plan of care for additional detail.  Compliance/Adherence/Medication fill history: Care Gaps: Shingrix Covid Vaccine Influenza   Star-Rating Drugs: Atorvastatin 80 mg: Last filled 09/02/20 for 90-DS  Patient's preferred pharmacy is:  [facility] [address], [place] [address] Phone: [phone] Fax: [place], [place] [address] [address] Phone: [phone] Fax: [phone]  [id number] [place], [place] [address] [place]: [phone]: [phone] Uses pill box? Yes Pt endorses 100% compliance  We discussed: Current pharmacy is preferred with insurance plan and patient is satisfied with pharmacy services Patient decided  to: Continue current medication management strategy  Care Plan and Follow Up Patient Decision:  Patient agrees to Care Plan and Follow-up.  Plan: Telephone follow up appointment with care management team member scheduled for:  10/05/2021 at 3:45 PM  AJunius Argyle PharmD, BPara [phone]

## 2021-03-16 ENCOUNTER — Telehealth: Payer: Self-pay

## 2021-03-16 ENCOUNTER — Telehealth: Payer: Self-pay | Admitting: Cardiology

## 2021-03-16 NOTE — Telephone Encounter (Signed)
Incoming triage call received.  Spoke with the patient. Patient sts that for the last couple of days her heart rates have been elevated at rest and with exertion. Patient reports HRs in the 120-130's. Her resting HRs are normally 40-50 bpm. She is taking Xarelto as prescribed. She denies chest pain/pressure, sob, dizziness or light headiness. Patient reports having a slight headache, but is in no distress. In the past she has experienced pounding in the chest when her HRs have been elevated. She has not experienced that with these episodes but she can tell her HR is elevated.  Patient sts that she has an exercise class at 9am this morning and would like to know if it is ok for her to attend. Adv her that I would advise against that. Adv the patient to take it easy today and rest.  Per Dr. Geannie Risen 12/09/20 documentation Atypical atrial flutter Maintaining sinus rhythm vast majority time.  Has had 1 episode of atrial flutter since the EP study.  She is taking Xarelto for stroke prophylaxis.  We will plan to touch base in about 1 year.  If things become more frequent in the interim, she will reach out and we can always move her appointment sooner.  If she were to have more frequent episodes of tachycardia, could consider initiation of an antiarrhythmic drug.  A work in appt at [facility] [employer] office has been scheduled with Dr. Lalla Brothers tomorrow 03/17/21 @ 11:40am. Adv the patient to arrive 20 min prior to the appt for check in.  Advised the patient that if cardiac symptoms develop in the interim she should report to the ED for evaluation. Pt sts her her son who is a EMT and her daughter who is a VET lives next door.  Pt verbalized understanding to the instructions given and voiced appreciation for the assistance.

## 2021-03-16 NOTE — Telephone Encounter (Signed)
See my chart response .

## 2021-03-16 NOTE — Telephone Encounter (Signed)
STAT if HR is under 50 or over 120 (normal HR is 60-100 beats per minute)  What is your heart rate? Hr 127  Do you have a log of your heart rate readings (document readings)? 130, 122, 127  Do you have any other symptoms? Slight headache but nothing excruciating.  hr is normally 45-50bpm and has jumped to 126-130 whether she is exerting herself or just sitting

## 2021-03-16 NOTE — Telephone Encounter (Signed)
Discussed Penny Reed symptoms with Dr. Lalla Brothers.  Dr. Lalla Brothers would like to offer patient an afternoon appointment in [place] tomorrow 03/17/21  Call placed to Penny Reed.  Penny Reed agreeable to appointment 03/17/21 at 4:15 pm at the [facility] office.  Directions given.

## 2021-03-16 NOTE — Patient Instructions (Signed)
Visit Information It was great speaking with you today!  Please let me know if you have any questions about our visit.   Goals Addressed             This Visit's Progress    Track and Manage My Symptoms-COPD   On track    Timeframe:  Long-Range Goal Priority:  High Start Date: 12/15/20                            Expected End Date: 12/15/2021                      Follow Up within 90 days   - begin a symptom diary - develop a rescue plan - eliminate symptom triggers at home - follow rescue plan if symptoms flare-up    Why is this important?   Tracking your symptoms and other information about your health helps your doctor plan your care.  Write down the symptoms, the time of day, what you were doing and what medicine you are taking.  You will soon learn how to manage your symptoms.     Notes:         Patient Care Plan: General Pharmacy (Adult)     Problem Identified: Hypertension, Hyperlipidemia, Atrial Fibrillation, Coronary Artery Disease, GERD, COPD, Hypothyroidism, Allergic Rhinitis, and Insomnia   Priority: High     Long-Range Goal: Patient-Specific Goal   Start Date: 12/15/2020  Expected End Date: 12/15/2021  This Visit's Progress: On track  Recent Progress: On track  Priority: High  Note:   Current Barriers:  Unable to independently afford treatment regimen  Pharmacist Clinical Goal(s):  Patient will verbalize ability to afford treatment regimen through collaboration with PharmD and provider.   Interventions: 1:1 collaboration with Jerrol Banana., MD regarding development and update of comprehensive plan of care as evidenced by provider attestation and co-signature Inter-disciplinary care team collaboration (see longitudinal plan of care) Comprehensive medication review performed; medication list updated in electronic medical record  Hypertension (BP goal <140/90) -Controlled -Current treatment: Amlodipine 5 mg daily  Furosemide 40 mg 1/2 tablet  daily as needed  Quinapril 40 mg daily  -Medications previously tried: NA  -Current home readings: 120/80 -Denies hypotensive/hypertensive symptoms -Counseled to monitor BP at home weekly, document, and provide log at future appointments -Recommended to continue current medication  Hyperlipidemia: (LDL goal < 70) -Controlled -Current treatment: Atorvastatin 80 mg daily  -Medications previously tried: NA  -Recommended to continue current medication  Atrial Flutter (Goal: prevent stroke and major bleeding) -Controlled -CHADSVASC: 4 -Current treatment: Rate control: None Anticoagulation: Xarelto 20 mg daily -Medications previously tried: Eliquis (Swelling) -Recommended to continue current medication   COPD (Goal: control symptoms and prevent exacerbations) -Controlled -Current treatment  Albuterol HFA 2 puffs every 6 hours as needed Anoro 1 puff daily  -Medications previously tried: NA -Lingering cough, but no worsening shortness of breath.  -Exacerbations requiring treatment in last 6 months: None -Patient reports consistent use of maintenance inhaler -Frequency of rescue inhaler use: sporadic -Patient reports cost concerns with Anoro inhaler. -Recommended to continue current medication Assessed patient finances. Will start PAP   Patient Goals/Self-Care Activities Patient will:  - check blood pressure weekly, document, and provide at future appointments  Follow Up Plan: Telephone follow up appointment with care management team member scheduled for:  10/05/2021 at 3:45 PM    Patient agreed to services and verbal consent obtained.  Patient verbalizes understanding of instructions provided today and agrees to view in MyChart.   [name], PharmD, P[place]y [place], CPP  Clinical Pharmacist Practitioner  [facility] [phone]

## 2021-03-17 ENCOUNTER — Ambulatory Visit: Payer: Medicare HMO | Admitting: Cardiology

## 2021-03-17 ENCOUNTER — Other Ambulatory Visit: Payer: Self-pay

## 2021-03-17 ENCOUNTER — Encounter: Payer: Self-pay | Admitting: Cardiology

## 2021-03-17 VITALS — BP 116/60 | HR 125 | Ht 67.0 in | Wt 179.2 lb

## 2021-03-17 DIAGNOSIS — I2510 Atherosclerotic heart disease of native coronary artery without angina pectoris: Secondary | ICD-10-CM

## 2021-03-17 DIAGNOSIS — I484 Atypical atrial flutter: Secondary | ICD-10-CM | POA: Diagnosis not present

## 2021-03-17 DIAGNOSIS — Z951 Presence of aortocoronary bypass graft: Secondary | ICD-10-CM | POA: Diagnosis not present

## 2021-03-17 NOTE — Progress Notes (Signed)
Electrophysiology Office Follow up Visit Note:    Date:  03/17/2021   ID:  Penny Reed, DOB 30-Nov-1945, MRN [id number]  PCP:  Jerrol Banana., MD  [facility] Cardiologist:  None  [facility] Electrophysiologist:  Vickie Epley, MD    Interval History:    Penny Reed is a 75 y.o. female who presents for a follow up visit. They were last seen in clinic December 09, 2020 for atypical atrial flutter.  That appointment was in follow-up after an EP study on Sep 04, 2020 during which multiple different atypical atrial flutters were induced.  These were not targeted with ablation and instead we decided to pursue medical therapy.  She is back in an atypical atrial flutter.  She has been locked in at this rate.  She has been scared to continue exercising while the heart rate is as high.  She presents today with her husband who I previously met.      Past Medical History:  Diagnosis Date   Allergic rhinitis    CAD (coronary artery disease)    Complication of anesthesia    woke up during colonoscopy   Hyperlipidemia    Hypertension    Hypothyroidism    LVH (left ventricular hypertrophy)     Past Surgical History:  Procedure Laterality Date   ABDOMINAL HYSTERECTOMY     has left ovary   APPENDECTOMY     CARDIAC CATHETERIZATION  09/2005   had 1 stent and 3 balloons placed   COLONOSCOPY WITH PROPOFOL N/A 12/04/2019   Procedure: COLONOSCOPY WITH PROPOFOL;  Surgeon: Robert Bellow, MD;  Location: [facility];  Service: Endoscopy;  Laterality: N/A;   CORONARY ARTERY BYPASS GRAFT     SVT ABLATION N/A 09/04/2020   Procedure: SVT ABLATION;  Surgeon: Vickie Epley, MD;  Location: [place] [facility];  Service: Cardiovascular;  Laterality: N/A;   TONSILLECTOMY      Current Medications: Current Meds  Medication Sig   albuterol (PROAIR HFA) 108 (90 BASE) MCG/ACT inhaler Inhale 2 puffs into the lungs every 6 (six) hours as needed for wheezing or shortness of  breath.   amLODipine (NORVASC) 5 MG tablet TAKE 1 TABLET BY MOUTH EVERY DAY   ANORO ELLIPTA 62.5-25 MCG/INH AEPB INHALE 1 PUFF BY MOUTH EVERY DAY   atorvastatin (LIPITOR) 80 MG tablet TAKE 1 TABLET BY MOUTH EVERY DAY   Azelastine HCl 137 MCG/SPRAY SOLN SPRAY 2 SPRAYS INTO EACH NOSTRIL EVERY DAY   Calcium Carbonate (CALCIUM 600 PO) Take 600 mg by mouth in the morning and at bedtime.   cetirizine (ZYRTEC) 10 MG tablet Take 10 mg by mouth daily.   cholecalciferol (VITAMIN D3) 25 MCG (1000 UNIT) tablet Take 1,000 Units by mouth daily.   furosemide (LASIX) 40 MG tablet Take 20 mg by mouth daily as needed for edema.   levothyroxine (SYNTHROID) 112 MCG tablet TAKE ONE TABLET BY MOUTH SIX DAYS PER WEEK. TAKE 1 AND 1/2 TABLET BY MOUTH ONE DAY PER WEEK.   Multiple Vitamin (MULTIVITAMIN) tablet Take 1 tablet by mouth daily. Standard Process   nitroGLYCERIN (NITROSTAT) 0.4 MG SL tablet Place 0.4 mg under the tongue every 5 (five) minutes as needed for chest pain.    Omega-3 Fatty Acids (FISH OIL) 1000 MG CAPS Take 3,000 mg by mouth daily.   Probiotic Product (ACIDOPHILUS PROBIOTIC BLEND PO) Take 1 capsule by mouth daily.   quinapril (ACCUPRIL) 40 MG tablet TAKE 1 TABLET BY MOUTH EVERY DAY  rivaroxaban (XARELTO) 20 MG TABS tablet TAKE 1 TABLET BY MOUTH DAILY WITH SUPPER. STOP ELIQUIS   zolpidem (AMBIEN) 5 MG tablet TAKE 1 TABLET BY MOUTH AT BEDTIME AS NEEDED FOR SLEEP.     Allergies:   Adhesive [tape], Azithromycin, and Eliquis [apixaban]   Social History   Socioeconomic History   Marital status: Married    Spouse name: Not on file   Number of children: 3   Years of education: Not on file   Highest education level: Associate degree: occupational, Hotel manager, or vocational program  Occupational History   Not on file  Tobacco Use   Smoking status: Former    Packs/day: 0.25    Years: 25.00    Pack years: 6.25    Types: Cigarettes    Quit date: 04/25/2005    Years since quitting: 15.9   Smokeless  tobacco: Never  Vaping Use   Vaping Use: Never used  Substance and Sexual Activity   Alcohol use: Yes    Alcohol/week: 5.0 standard drinks    Types: 5 Glasses of wine per week    Comment: spread out between 2-3 nights a week   Drug use: No   Sexual activity: Not on file  Other Topics Concern   Not on file  Social History Narrative   Not on file   Social Determinants of Health   Financial Resource Strain: Low Risk    Difficulty of Paying Living Expenses: Not hard at all  Food Insecurity: Not on file  Transportation Needs: No Transportation Needs   Lack of Transportation (Medical): No   Lack of Transportation (Non-Medical): No  Physical Activity: Not on file  Stress: Not on file  Social Connections: Not on file     Family History: The patient's family history includes Asthma in her sister; Breast cancer in her maternal aunt; Breast cancer (age of onset: 76) in her sister; Cancer in her sister; Cancer - Colon in her paternal grandmother; Coronary artery disease in her father; Diabetes in her mother; Heart attack in her brother and father; Hyperlipidemia in her sister; Hypertension in her sister; Scleroderma in her sister; Stroke in her mother; Thyroid disease in her sister.  ROS:   Please see the history of present illness.    All other systems reviewed and are negative.  EKGs/Labs/Other Studies Reviewed:    The following studies were reviewed today:    EKG:  The ekg ordered today demonstrates atypical atrial flutter with a ventricular rate of 125 bpm.  PVC.  Recent Labs: 08/11/2020: BUN 13; Creatinine, Ser 0.85; Hemoglobin 14.0; Platelets 198; Potassium 4.2; Sodium 139  Recent Lipid Panel    Component Value Date/Time   CHOL 184 09/05/2018 1149   TRIG 77 09/05/2018 1149   HDL 89 09/05/2018 1149   CHOLHDL 2.1 09/05/2018 1149   LDLCALC 80 09/05/2018 1149    Physical Exam:    VS:  BP 116/60   Pulse (!) 125   Ht 5' 7"  (1.702 m)   Wt 179 lb 3.2 oz (81.3 kg)   SpO2  98%   BMI 28.07 kg/m     Wt Readings from Last 3 Encounters:  03/17/21 179 lb 3.2 oz (81.3 kg)  12/09/20 176 lb 12.8 oz (80.2 kg)  09/16/20 184 lb (83.5 kg)     GEN:  Well nourished, well developed in no acute distress HEENT: Normal NECK: No JVD; No carotid bruits LYMPHATICS: No lymphadenopathy CARDIAC: Tachycardic, regular rhythm, no murmurs, rubs, gallops RESPIRATORY:  Clear to auscultation  without rales, wheezing or rhonchi  ABDOMEN: Soft, non-tender, non-distended MUSCULOSKELETAL:  No edema; No deformity  SKIN: Warm and dry NEUROLOGIC:  Alert and oriented x 3 PSYCHIATRIC:  Normal affect        ASSESSMENT:    1. Atypical atrial flutter ([place])   2. Coronary artery disease involving native coronary artery of native heart without angina pectoris   3. Hx of CABG    PLAN:    In order of problems listed above:   #Atypical atrial flutter Multiple circuits on EP study earlier this year.  Is back in an atypical atrial flutter with a ventricular to 125 bpm.  A rhythm control strategy is indicated.  We discussed antiarrhythmic drugs and ablation during today's visit.  Given the multiple circuits, I do not think proceeding directly to ablation is the best first step.  I would recommend starting an antiarrhythmic drug.  We discussed Tikosyn and amiodarone during today's visit.  Given the potential for off target effects, I would prefer to start with dofetilide/Tikosyn.  This process was discussed in detail with the patient and her husband.  To get her some immediate relief, we will plan to schedule a cardioversion.  She will think about starting Tikosyn will let us know she would like to proceed with Tikosyn loading. She is not missed a dose of Xarelto.  I have encouraged her to continue Xarelto daily.  #Coronary artery disease No ischemic symptoms.  Total time spent with patient today 45 minutes. This includes reviewing records, evaluating the patient and coordinating care.    Medication Adjustments/Labs and Tests Ordered: Current medicines are reviewed at length with the patient today.  Concerns regarding medicines are outlined above.  No orders of the defined types were placed in this encounter.  No orders of the defined types were placed in this encounter.    Signed, Lars Mage, MD, [facility], [facility] 03/17/2021 6:03 PM    Electrophysiology [facility]

## 2021-03-17 NOTE — H&P (View-Only) (Signed)
Electrophysiology Office Follow up Visit Note:    Date:  03/17/2021   ID:  LOWELL MCGURK, DOB 22-Jun-1945, MRN [id number]  PCP:  Jerrol Banana., MD  [facility] Cardiologist:  None  [facility] Electrophysiologist:  Vickie Epley, MD    Interval History:    Penny Reed is a 75 y.o. female who presents for a follow up visit. They were last seen in clinic December 09, 2020 for atypical atrial flutter.  That appointment was in follow-up after an EP study on Sep 04, 2020 during which multiple different atypical atrial flutters were induced.  These were not targeted with ablation and instead we decided to pursue medical therapy.  She is back in an atypical atrial flutter.  She has been locked in at this rate.  She has been scared to continue exercising while the heart rate is as high.  She presents today with her husband who I previously met.      Past Medical History:  Diagnosis Date   Allergic rhinitis    CAD (coronary artery disease)    Complication of anesthesia    woke up during colonoscopy   Hyperlipidemia    Hypertension    Hypothyroidism    LVH (left ventricular hypertrophy)     Past Surgical History:  Procedure Laterality Date   ABDOMINAL HYSTERECTOMY     has left ovary   APPENDECTOMY     CARDIAC CATHETERIZATION  09/2005   had 1 stent and 3 balloons placed   COLONOSCOPY WITH PROPOFOL N/A 12/04/2019   Procedure: COLONOSCOPY WITH PROPOFOL;  Surgeon: Robert Bellow, MD;  Location: [facility];  Service: Endoscopy;  Laterality: N/A;   CORONARY ARTERY BYPASS GRAFT     SVT ABLATION N/A 09/04/2020   Procedure: SVT ABLATION;  Surgeon: Vickie Epley, MD;  Location: [place] [facility];  Service: Cardiovascular;  Laterality: N/A;   TONSILLECTOMY      Current Medications: Current Meds  Medication Sig   albuterol (PROAIR HFA) 108 (90 BASE) MCG/ACT inhaler Inhale 2 puffs into the lungs every 6 (six) hours as needed for wheezing or shortness of  breath.   amLODipine (NORVASC) 5 MG tablet TAKE 1 TABLET BY MOUTH EVERY DAY   ANORO ELLIPTA 62.5-25 MCG/INH AEPB INHALE 1 PUFF BY MOUTH EVERY DAY   atorvastatin (LIPITOR) 80 MG tablet TAKE 1 TABLET BY MOUTH EVERY DAY   Azelastine HCl 137 MCG/SPRAY SOLN SPRAY 2 SPRAYS INTO EACH NOSTRIL EVERY DAY   Calcium Carbonate (CALCIUM 600 PO) Take 600 mg by mouth in the morning and at bedtime.   cetirizine (ZYRTEC) 10 MG tablet Take 10 mg by mouth daily.   cholecalciferol (VITAMIN D3) 25 MCG (1000 UNIT) tablet Take 1,000 Units by mouth daily.   furosemide (LASIX) 40 MG tablet Take 20 mg by mouth daily as needed for edema.   levothyroxine (SYNTHROID) 112 MCG tablet TAKE ONE TABLET BY MOUTH SIX DAYS PER WEEK. TAKE 1 AND 1/2 TABLET BY MOUTH ONE DAY PER WEEK.   Multiple Vitamin (MULTIVITAMIN) tablet Take 1 tablet by mouth daily. Standard Process   nitroGLYCERIN (NITROSTAT) 0.4 MG SL tablet Place 0.4 mg under the tongue every 5 (five) minutes as needed for chest pain.    Omega-3 Fatty Acids (FISH OIL) 1000 MG CAPS Take 3,000 mg by mouth daily.   Probiotic Product (ACIDOPHILUS PROBIOTIC BLEND PO) Take 1 capsule by mouth daily.   quinapril (ACCUPRIL) 40 MG tablet TAKE 1 TABLET BY MOUTH EVERY DAY  rivaroxaban (XARELTO) 20 MG TABS tablet TAKE 1 TABLET BY MOUTH DAILY WITH SUPPER. STOP ELIQUIS   zolpidem (AMBIEN) 5 MG tablet TAKE 1 TABLET BY MOUTH AT BEDTIME AS NEEDED FOR SLEEP.     Allergies:   Adhesive [tape], Azithromycin, and Eliquis [apixaban]   Social History   Socioeconomic History   Marital status: Married    Spouse name: Not on file   Number of children: 3   Years of education: Not on file   Highest education level: Associate degree: occupational, Hotel manager, or vocational program  Occupational History   Not on file  Tobacco Use   Smoking status: Former    Packs/day: 0.25    Years: 25.00    Pack years: 6.25    Types: Cigarettes    Quit date: 04/25/2005    Years since quitting: 15.9   Smokeless  tobacco: Never  Vaping Use   Vaping Use: Never used  Substance and Sexual Activity   Alcohol use: Yes    Alcohol/week: 5.0 standard drinks    Types: 5 Glasses of wine per week    Comment: spread out between 2-3 nights a week   Drug use: No   Sexual activity: Not on file  Other Topics Concern   Not on file  Social History Narrative   Not on file   Social Determinants of Health   Financial Resource Strain: Low Risk    Difficulty of Paying Living Expenses: Not hard at all  Food Insecurity: Not on file  Transportation Needs: No Transportation Needs   Lack of Transportation (Medical): No   Lack of Transportation (Non-Medical): No  Physical Activity: Not on file  Stress: Not on file  Social Connections: Not on file     Family History: The patient's family history includes Asthma in her sister; Breast cancer in her maternal aunt; Breast cancer (age of onset: 31) in her sister; Cancer in her sister; Cancer - Colon in her paternal grandmother; Coronary artery disease in her father; Diabetes in her mother; Heart attack in her brother and father; Hyperlipidemia in her sister; Hypertension in her sister; Scleroderma in her sister; Stroke in her mother; Thyroid disease in her sister.  ROS:   Please see the history of present illness.    All other systems reviewed and are negative.  EKGs/Labs/Other Studies Reviewed:    The following studies were reviewed today:    EKG:  The ekg ordered today demonstrates atypical atrial flutter with a ventricular rate of 125 bpm.  PVC.  Recent Labs: 08/11/2020: BUN 13; Creatinine, Ser 0.85; Hemoglobin 14.0; Platelets 198; Potassium 4.2; Sodium 139  Recent Lipid Panel    Component Value Date/Time   CHOL 184 09/05/2018 1149   TRIG 77 09/05/2018 1149   HDL 89 09/05/2018 1149   CHOLHDL 2.1 09/05/2018 1149   LDLCALC 80 09/05/2018 1149    Physical Exam:    VS:  BP 116/60   Pulse (!) 125   Ht 5' 7"  (1.702 m)   Wt 179 lb 3.2 oz (81.3 kg)   SpO2  98%   BMI 28.07 kg/m     Wt Readings from Last 3 Encounters:  03/17/21 179 lb 3.2 oz (81.3 kg)  12/09/20 176 lb 12.8 oz (80.2 kg)  09/16/20 184 lb (83.5 kg)     GEN:  Well nourished, well developed in no acute distress HEENT: Normal NECK: No JVD; No carotid bruits LYMPHATICS: No lymphadenopathy CARDIAC: Tachycardic, regular rhythm, no murmurs, rubs, gallops RESPIRATORY:  Clear to auscultation  without rales, wheezing or rhonchi  ABDOMEN: Soft, non-tender, non-distended MUSCULOSKELETAL:  No edema; No deformity  SKIN: Warm and dry NEUROLOGIC:  Alert and oriented x 3 PSYCHIATRIC:  Normal affect        ASSESSMENT:    1. Atypical atrial flutter ([place])   2. Coronary artery disease involving native coronary artery of native heart without angina pectoris   3. Hx of CABG    PLAN:    In order of problems listed above:   #Atypical atrial flutter Multiple circuits on EP study earlier this year.  Is back in an atypical atrial flutter with a ventricular to 125 bpm.  A rhythm control strategy is indicated.  We discussed antiarrhythmic drugs and ablation during today's visit.  Given the multiple circuits, I do not think proceeding directly to ablation is the best first step.  I would recommend starting an antiarrhythmic drug.  We discussed Tikosyn and amiodarone during today's visit.  Given the potential for off target effects, I would prefer to start with dofetilide/Tikosyn.  This process was discussed in detail with the patient and her husband.  To get her some immediate relief, we will plan to schedule a cardioversion.  She will think about starting Tikosyn will let us know she would like to proceed with Tikosyn loading. She is not missed a dose of Xarelto.  I have encouraged her to continue Xarelto daily.  #Coronary artery disease No ischemic symptoms.  Total time spent with patient today 45 minutes. This includes reviewing records, evaluating the patient and coordinating care.    Medication Adjustments/Labs and Tests Ordered: Current medicines are reviewed at length with the patient today.  Concerns regarding medicines are outlined above.  No orders of the defined types were placed in this encounter.  No orders of the defined types were placed in this encounter.    Signed, Lars Mage, MD, [facility], [facility] 03/17/2021 6:03 PM    Electrophysiology [facility]

## 2021-03-17 NOTE — Patient Instructions (Signed)
Medication Instructions:  Your physician recommends that you continue on your current medications as directed. Please refer to the Current Medication list given to you today. *If you need a refill on your cardiac medications before your next appointment, please call your pharmacy*  Lab Work: None ordered. If you have labs (blood work) drawn today and your tests are completely normal, you will receive your results only by: MyChart Message (if you have MyChart) OR A paper copy in the mail If you have any lab test that is abnormal or we need to change your treatment, we will call you to review the results.  Testing/Procedures: Your physician has recommended that you have a Cardioversion (DCCV). Electrical Cardioversion uses a jolt of electricity to your heart either through paddles or wired patches attached to your chest. This is a controlled, usually prescheduled, procedure. Defibrillation is done under light anesthesia in the hospital, and you usually go home the day of the procedure. This is done to get your heart back into a normal rhythm. You are not awake for the procedure. Please see the instruction sheet given to you today.  Follow-Up:  CARDIOVERSION +/- DOFETILIDE

## 2021-03-23 ENCOUNTER — Other Ambulatory Visit: Payer: Self-pay

## 2021-03-23 ENCOUNTER — Ambulatory Visit: Payer: Self-pay

## 2021-03-23 ENCOUNTER — Ambulatory Visit (INDEPENDENT_AMBULATORY_CARE_PROVIDER_SITE_OTHER): Payer: Medicare HMO | Admitting: Family Medicine

## 2021-03-23 ENCOUNTER — Encounter: Payer: Self-pay | Admitting: Family Medicine

## 2021-03-23 ENCOUNTER — Telehealth: Payer: Self-pay

## 2021-03-23 VITALS — BP 138/82 | HR 83 | Temp 98.5°F | Resp 16 | Ht 67.0 in | Wt 180.0 lb

## 2021-03-23 DIAGNOSIS — J449 Chronic obstructive pulmonary disease, unspecified: Secondary | ICD-10-CM

## 2021-03-23 DIAGNOSIS — I10 Essential (primary) hypertension: Secondary | ICD-10-CM | POA: Diagnosis not present

## 2021-03-23 DIAGNOSIS — E7800 Pure hypercholesterolemia, unspecified: Secondary | ICD-10-CM

## 2021-03-23 DIAGNOSIS — E039 Hypothyroidism, unspecified: Secondary | ICD-10-CM | POA: Diagnosis not present

## 2021-03-23 DIAGNOSIS — I484 Atypical atrial flutter: Secondary | ICD-10-CM

## 2021-03-23 DIAGNOSIS — I2510 Atherosclerotic heart disease of native coronary artery without angina pectoris: Secondary | ICD-10-CM

## 2021-03-23 DIAGNOSIS — R739 Hyperglycemia, unspecified: Secondary | ICD-10-CM

## 2021-03-23 DIAGNOSIS — Z23 Encounter for immunization: Secondary | ICD-10-CM | POA: Diagnosis not present

## 2021-03-23 MED ORDER — UMECLIDINIUM-VILANTEROL 62.5-25 MCG/ACT IN AEPB: 1.0000 | INHALATION_SPRAY | Freq: Every day | RESPIRATORY_TRACT | 3 refills | Status: DC

## 2021-03-23 NOTE — Progress Notes (Signed)
I,April Miller,acting as a scribe for Wilhemena Durie, MD.,have documented all relevant documentation on the behalf of Wilhemena Durie, MD,as directed by  Wilhemena Durie, MD while in the presence of Wilhemena Durie, MD.   Established patient visit   Patient: Penny Reed   DOB: 1945/12/27   75 y.o. Female  MRN: [id number] Visit Date: 03/23/2021  Today's healthcare provider: Wilhemena Durie, MD   Chief Complaint  Patient presents with   Follow-up   Hypertension   Hyperlipidemia   Hypothyroidism   Subjective    HPI  Overall patient feeling well.  She is having from problems with atrial flutter and wants to discuss her options.  She is looking at cardioversion and Tikosyn and amiodarone. She is tolerating her medications presently very well Hypertension, follow-up  BP Readings from Last 3 Encounters:  03/23/21 138/82  03/17/21 116/60  12/09/20 128/78   Wt Readings from Last 3 Encounters:  03/23/21 180 lb (81.6 kg)  03/17/21 179 lb 3.2 oz (81.3 kg)  12/09/20 176 lb 12.8 oz (80.2 kg)     She was last seen for hypertension 6 months ago.  BP at that visit was 160/60. Management since that visit includes; Follow home blood pressure readings. She reports good compliance with treatment. She is not having side effects. none She is exercising. She is adherent to low salt diet.   Outside blood pressures are 130/80.  She does not smoke.  Use of agents associated with hypertension: none.   --------------------------------------------------------------------------------------------------- Lipid/Cholesterol, follow-up  Last Lipid Panel: Lab Results  Component Value Date   CHOL 184 09/05/2018   Blue Eye 80 09/05/2018   HDL 89 09/05/2018   TRIG 77 09/05/2018    She was last seen for this 5/13/202.  Management since that visit includes; on atorvastatin.  She reports fair compliance with treatment. She is not having side effects. none  She is  following a Regular diet. Current exercise: walking  Last metabolic panel Lab Results  Component Value Date   GLUCOSE 122 (H) 08/11/2020   NA 139 08/11/2020   K 4.2 08/11/2020   BUN 13 08/11/2020   CREATININE 0.85 08/11/2020   EGFR 72 08/11/2020   GFRNONAA 71 05/29/2020   CALCIUM 10.2 08/11/2020   AST 38 09/05/2018   ALT 20 08/28/2017   The ASCVD Risk score (Arnett DK, et al., 2019) failed to calculate for the following reasons:   The patient has a prior MI or stroke diagnosis  ---------------------------------------------------------------------------------------------------     Medications: Outpatient Medications Prior to Visit  Medication Sig   albuterol (PROAIR HFA) 108 (90 BASE) MCG/ACT inhaler Inhale 2 puffs into the lungs every 6 (six) hours as needed for wheezing or shortness of breath.   amLODipine (NORVASC) 5 MG tablet TAKE 1 TABLET BY MOUTH EVERY DAY   atorvastatin (LIPITOR) 80 MG tablet TAKE 1 TABLET BY MOUTH EVERY DAY   Azelastine HCl 137 MCG/SPRAY SOLN SPRAY 2 SPRAYS INTO EACH NOSTRIL EVERY DAY   Calcium Carbonate (CALCIUM 600 PO) Take 600 mg by mouth in the morning and at bedtime.   cetirizine (ZYRTEC) 10 MG tablet Take 10 mg by mouth daily.   cholecalciferol (VITAMIN D3) 25 MCG (1000 UNIT) tablet Take 1,000 Units by mouth daily.   furosemide (LASIX) 40 MG tablet Take 20 mg by mouth daily as needed for edema.   levothyroxine (SYNTHROID) 112 MCG tablet TAKE ONE TABLET BY MOUTH SIX DAYS PER WEEK. TAKE 1 AND 1/2 TABLET  BY MOUTH ONE DAY PER WEEK.   Multiple Vitamin (MULTIVITAMIN) tablet Take 1 tablet by mouth daily. Standard Process   nitroGLYCERIN (NITROSTAT) 0.4 MG SL tablet Place 0.4 mg under the tongue every 5 (five) minutes as needed for chest pain.    Omega-3 Fatty Acids (FISH OIL) 1000 MG CAPS Take 3,000 mg by mouth daily.   Probiotic Product (ACIDOPHILUS PROBIOTIC BLEND PO) Take 1 capsule by mouth daily.   quinapril (ACCUPRIL) 40 MG tablet TAKE 1 TABLET BY  MOUTH EVERY DAY   rivaroxaban (XARELTO) 20 MG TABS tablet TAKE 1 TABLET BY MOUTH DAILY WITH SUPPER. STOP ELIQUIS   zolpidem (AMBIEN) 5 MG tablet TAKE 1 TABLET BY MOUTH AT BEDTIME AS NEEDED FOR SLEEP.   [DISCONTINUED] ANORO ELLIPTA 62.5-25 MCG/INH AEPB INHALE 1 PUFF BY MOUTH EVERY DAY   No facility-administered medications prior to visit.    Review of Systems      Objective    BP 138/82 (BP Location: Left Arm, Patient Position: Sitting, Cuff Size: Normal)   Pulse 83   Temp 98.5 F (36.9 C) (Temporal)   Resp 16   Ht _0  (1.702 m)   Wt 180 lb (81.6 kg)   SpO2 97%   BMI 28.19 kg/m  BP Readings from Last 3 Encounters:  03/23/21 138/82  03/17/21 116/60  12/09/20 128/78   Wt Readings from Last 3 Encounters:  03/23/21 180 lb (81.6 kg)  03/17/21 179 lb 3.2 oz (81.3 kg)  12/09/20 176 lb 12.8 oz (80.2 kg)      Physical Exam Vitals reviewed.  Constitutional:      Appearance: She is well-developed.  HENT:     Head: Normocephalic and atraumatic.  Eyes:     General: No scleral icterus.    Conjunctiva/sclera: Conjunctivae normal.  Neck:     Thyroid: No thyromegaly.  Cardiovascular:     Rate and Rhythm: Normal rate and regular rhythm.     Heart sounds: Normal heart sounds.  Pulmonary:     Effort: Pulmonary effort is normal.     Breath sounds: Normal breath sounds.  Abdominal:     Palpations: Abdomen is soft.  Skin:    General: Skin is warm and dry.  Neurological:     General: No focal deficit present.     Mental Status: She is alert and oriented to person, place, and time.  Psychiatric:        Mood and Affect: Mood normal.        Behavior: Behavior normal.        Thought Content: Thought content normal.        Judgment: Judgment normal.      No results found for any visits on 03/23/21.  Assessment & Plan     1. Essential hypertension Blood pressure under good control - Lipid panel - TSH - CBC w/Diff/Platelet - Comprehensive Metabolic Panel (CMET) -  Hemoglobin A1c  2. Acquired hypothyroidism Check level on Synthroid 112 mcg daily  - Lipid panel - TSH - CBC w/Diff/Platelet - Comprehensive Metabolic Panel (CMET) - Hemoglobin A1c  3. Pure hypercholesterolemia On high-dose atorvastatin 80 - Lipid panel - TSH - CBC w/Diff/Platelet - Comprehensive Metabolic Panel (CMET) - Hemoglobin A1c  4. Arteriosclerosis of coronary artery All risk factors treated - Lipid panel - TSH - CBC w/Diff/Platelet - Comprehensive Metabolic Panel (CMET) - Hemoglobin A1c  5. Atypical atrial flutter (HCC) Has follow-up with cardiology in the near future regarding cardioversion and Tikosyn versus amiodarone - Lipid panel -  TSH - CBC w/Diff/Platelet - Comprehensive Metabolic Panel (CMET) - Hemoglobin A1c  6. COPD, moderate ([place]) Clinically stable.  Patient's quit smoking years ago - Lipid panel - TSH - CBC w/Diff/Platelet - Comprehensive Metabolic Panel (CMET) - Hemoglobin A1c  7. Hyperglycemia Check A1c. - Lipid panel - TSH - CBC w/Diff/Platelet - Comprehensive Metabolic Panel (CMET) - Hemoglobin A1c  8. Need for influenza vaccination  - Flu Vaccine QUAD High Dose(Fluad)   Return in about 6 months (around 09/20/2021).      I, Wilhemena Durie, MD, have reviewed all documentation for this visit. The documentation on 03/24/21 for the exam, diagnosis, procedures, and orders are all accurate and complete.    Vallory Oetken Cranford Mon, MD  [facility] [phone] (phone) [phone] (fax)  [place]

## 2021-03-23 NOTE — Progress Notes (Signed)
[employer] Patient Assistance form for Anoro completed by patient and returned to clinic. Printed prescription sent to Dr. Sullivan Lone for Signature.   [name], PharmD, Patsy [place], CPP  Clinical Pharmacist Practitioner  [facility] [phone]

## 2021-03-24 DIAGNOSIS — J449 Chronic obstructive pulmonary disease, unspecified: Secondary | ICD-10-CM | POA: Diagnosis not present

## 2021-03-24 DIAGNOSIS — I4892 Unspecified atrial flutter: Secondary | ICD-10-CM | POA: Insufficient documentation

## 2021-03-24 DIAGNOSIS — E7800 Pure hypercholesterolemia, unspecified: Secondary | ICD-10-CM | POA: Diagnosis not present

## 2021-03-24 LAB — CBC WITH DIFFERENTIAL/PLATELET
Basophils Absolute: 0 10*3/uL (ref 0.0–0.2)
Basos: 0 %
EOS (ABSOLUTE): 0.1 10*3/uL (ref 0.0–0.4)
Eos: 2 %
Hematocrit: 39.7 % (ref 34.0–46.6)
Hemoglobin: 13.5 g/dL (ref 11.1–15.9)
Immature Grans (Abs): 0 10*3/uL (ref 0.0–0.1)
Immature Granulocytes: 0 %
Lymphocytes Absolute: 1.5 10*3/uL (ref 0.7–3.1)
Lymphs: 33 %
MCH: 32.7 pg (ref 26.6–33.0)
MCHC: 34 g/dL (ref 31.5–35.7)
MCV: 96 fL (ref 79–97)
Monocytes Absolute: 0.4 10*3/uL (ref 0.1–0.9)
Monocytes: 9 %
Neutrophils Absolute: 2.5 10*3/uL (ref 1.4–7.0)
Neutrophils: 56 %
Platelets: 203 10*3/uL (ref 150–450)
RBC: 4.13 x10E6/uL (ref 3.77–5.28)
RDW: 11.7 % (ref 11.7–15.4)
WBC: 4.5 10*3/uL (ref 3.4–10.8)

## 2021-03-24 LAB — HEMOGLOBIN A1C
Est. average glucose Bld gHb Est-mCnc: 120 mg/dL
Hgb A1c MFr Bld: 5.8 % — ABNORMAL HIGH (ref 4.8–5.6)

## 2021-03-24 LAB — COMPREHENSIVE METABOLIC PANEL
ALT: 23 IU/L (ref 0–32)
AST: 29 IU/L (ref 0–40)
Albumin/Globulin Ratio: 2 (ref 1.2–2.2)
Albumin: 4.7 g/dL (ref 3.7–4.7)
Alkaline Phosphatase: 92 IU/L (ref 44–121)
BUN/Creatinine Ratio: 23 (ref 12–28)
BUN: 17 mg/dL (ref 8–27)
Bilirubin Total: 0.8 mg/dL (ref 0.0–1.2)
CO2: 25 mmol/L (ref 20–29)
Calcium: 10.1 mg/dL (ref 8.7–10.3)
Chloride: 99 mmol/L (ref 96–106)
Creatinine, Ser: 0.75 mg/dL (ref 0.57–1.00)
Globulin, Total: 2.3 g/dL (ref 1.5–4.5)
Glucose: 126 mg/dL — ABNORMAL HIGH (ref 70–99)
Potassium: 4.4 mmol/L (ref 3.5–5.2)
Sodium: 140 mmol/L (ref 134–144)
Total Protein: 7 g/dL (ref 6.0–8.5)
eGFR: 84 mL/min/{1.73_m2} (ref >59)

## 2021-03-24 LAB — TSH: TSH: 2.31 u[IU]/mL (ref 0.450–4.500)

## 2021-03-24 LAB — LIPID PANEL
Chol/HDL Ratio: 2.4 ratio (ref 0.0–4.4)
Cholesterol, Total: 172 mg/dL (ref 100–199)
HDL: 71 mg/dL (ref >39)
LDL Chol Calc (NIH): 88 mg/dL (ref 0–99)
Triglycerides: 69 mg/dL (ref 0–149)
VLDL Cholesterol Cal: 13 mg/dL (ref 5–40)

## 2021-03-25 NOTE — Telephone Encounter (Signed)
Pt scheduled for DCCV  Call placed to Pt-confirmed all instructions received and understood.  No questions.

## 2021-03-29 ENCOUNTER — Ambulatory Visit (INDEPENDENT_AMBULATORY_CARE_PROVIDER_SITE_OTHER): Payer: Medicare HMO

## 2021-03-29 DIAGNOSIS — J449 Chronic obstructive pulmonary disease, unspecified: Secondary | ICD-10-CM

## 2021-03-29 NOTE — Progress Notes (Signed)
[employer] Patient Assistance form for Laure [facility]ney completed by patient and faxed for review on 03/29/21  [name], PharmD, Patsy [place], CPP  Clinical Pharmacist Practitioner  [facility] [phone]

## 2021-03-30 ENCOUNTER — Other Ambulatory Visit: Payer: Self-pay | Admitting: Family Medicine

## 2021-03-31 ENCOUNTER — Ambulatory Visit: Payer: Medicare HMO | Admitting: Cardiology

## 2021-04-01 NOTE — Anesthesia Preprocedure Evaluation (Addendum)
Anesthesia Evaluation  Patient identified by MRN, date of birth, ID band Patient awake    Reviewed: Allergy & Precautions, NPO status , Patient's Chart, lab work & pertinent test results  History of Anesthesia Complications Negative for: history of anesthetic complications  Airway Mallampati: II  TM Distance: >3 FB Neck ROM: Full    Dental  (+) Dental Advisory Given, Teeth Intact   Pulmonary COPD,  COPD inhaler, former smoker,  09/02/2020 SARS coronavirus NEG   breath sounds clear to auscultation       Cardiovascular hypertension, Pt. on medications + CAD, + Past MI and + Cardiac Stents  + dysrhythmias Supra Ventricular Tachycardia  Rhythm:Regular Rate:Normal  07/20/2020 Cardiac MRI FINDINGS: 1. Normal left ventricular size, mild proximal septal wall hypertrophy. Normal systolic function (LVEF = 59%). There is Moderate basal-mid septal wall hypokinesis. There is a subendocardial late gadolinium enhancement in the basal and mid septal wall of the left ventricular myocardium. LVESV: 72 ml 2. Normal right ventricular size, thickness and systolic function (RVEF =60%). There are no regional wall motion abnormalities. 3. Normal left and right atrial size. 4. Normal size of the aortic root, ascending aorta and pulmonaryartery. 5. No significant valvular abnormalities. 6. Normal pericardium. No pericardial effusion.     Neuro/Psych negative neurological ROS     GI/Hepatic Neg liver ROS, GERD  Controlled,  Endo/Other  Hypothyroidism   Renal/GU negative Renal ROS     Musculoskeletal   Abdominal   Peds  Hematology negative hematology ROS (+)   Anesthesia Other Findings   Reproductive/Obstetrics                            Anesthesia Physical  Anesthesia Plan  ASA: 3  Anesthesia Plan: General   Post-op Pain Management: Minimal or no pain anticipated   Induction:  Intravenous  PONV Risk Score and Plan: 2  Airway Management Planned: Natural Airway and Mask  Additional Equipment: None  Intra-op Plan:   Post-operative Plan:   Informed Consent: I have reviewed the patients History and Physical, chart, labs and discussed the procedure including the risks, benefits and alternatives for the proposed anesthesia with the patient or authorized representative who has indicated his/her understanding and acceptance.       Plan Discussed with: CRNA and Anesthesiologist  Anesthesia Plan Comments:        Anesthesia Quick Evaluation

## 2021-04-02 ENCOUNTER — Encounter (HOSPITAL_COMMUNITY): Payer: Self-pay

## 2021-04-02 ENCOUNTER — Encounter (HOSPITAL_COMMUNITY): Payer: Self-pay | Admitting: Cardiology

## 2021-04-02 ENCOUNTER — Ambulatory Visit (HOSPITAL_COMMUNITY)
Admission: RE | Admit: 2021-04-02 | Discharge: 2021-04-02 | Disposition: A | Discharge status: Home / Self Care | Payer: Medicare HMO | Source: Ambulatory Visit | Attending: Cardiology | Admitting: Cardiology

## 2021-04-02 ENCOUNTER — Ambulatory Visit (HOSPITAL_COMMUNITY): Payer: Medicare HMO | Admitting: Anesthesiology

## 2021-04-02 ENCOUNTER — Ambulatory Visit (HOSPITAL_COMMUNITY): Admit: 2021-04-02 | Payer: Medicare HMO | Admitting: Cardiology

## 2021-04-02 ENCOUNTER — Other Ambulatory Visit: Payer: Self-pay

## 2021-04-02 ENCOUNTER — Encounter (HOSPITAL_COMMUNITY)
Admission: RE | Disposition: A | Discharge status: Home / Self Care | Payer: Self-pay | Source: Ambulatory Visit | Attending: Cardiology

## 2021-04-02 DIAGNOSIS — I2510 Atherosclerotic heart disease of native coronary artery without angina pectoris: Secondary | ICD-10-CM | POA: Insufficient documentation

## 2021-04-02 DIAGNOSIS — J449 Chronic obstructive pulmonary disease, unspecified: Secondary | ICD-10-CM | POA: Diagnosis not present

## 2021-04-02 DIAGNOSIS — I4892 Unspecified atrial flutter: Secondary | ICD-10-CM | POA: Diagnosis not present

## 2021-04-02 DIAGNOSIS — Z951 Presence of aortocoronary bypass graft: Secondary | ICD-10-CM | POA: Diagnosis not present

## 2021-04-02 DIAGNOSIS — I4891 Unspecified atrial fibrillation: Secondary | ICD-10-CM | POA: Diagnosis not present

## 2021-04-02 DIAGNOSIS — Z7901 Long term (current) use of anticoagulants: Secondary | ICD-10-CM | POA: Insufficient documentation

## 2021-04-02 DIAGNOSIS — I484 Atypical atrial flutter: Secondary | ICD-10-CM | POA: Diagnosis not present

## 2021-04-02 DIAGNOSIS — K219 Gastro-esophageal reflux disease without esophagitis: Secondary | ICD-10-CM | POA: Diagnosis not present

## 2021-04-02 DIAGNOSIS — E785 Hyperlipidemia, unspecified: Secondary | ICD-10-CM | POA: Diagnosis not present

## 2021-04-02 HISTORY — PX: CARDIOVERSION: SHX1299

## 2021-04-02 SURGERY — CARDIOVERSION
Anesthesia: General

## 2021-04-02 SURGERY — CARDIOVERSION
Anesthesia: Monitor Anesthesia Care

## 2021-04-02 MED ORDER — SODIUM CHLORIDE 0.9 % IV SOLN: INTRAVENOUS | Status: DC

## 2021-04-02 MED ORDER — LIDOCAINE 2% (20 MG/ML) 5 ML SYRINGE
INTRAMUSCULAR | Status: DC | PRN
  Administered 2021-04-02: 20 mg via INTRAVENOUS

## 2021-04-02 MED ORDER — SODIUM CHLORIDE 0.9 % IV SOLN
INTRAVENOUS | Status: DC | PRN
Start: 2021-04-02 — End: 2021-04-02

## 2021-04-02 MED ORDER — PHENYLEPHRINE HCL (PRESSORS) 10 MG/ML IV SOLN
INTRAVENOUS | Status: DC | PRN
  Administered 2021-04-02: 120 ug via INTRAVENOUS

## 2021-04-02 MED ORDER — PROPOFOL 10 MG/ML IV BOLUS
INTRAVENOUS | Status: DC | PRN
  Administered 2021-04-02: 100 mg via INTRAVENOUS

## 2021-04-02 NOTE — Anesthesia Procedure Notes (Signed)
Procedure Name: General with mask airway Date/Time: 04/02/2021 8:30 AM Performed by: Nils Pyle, CRNA Pre-anesthesia Checklist: Patient identified, Emergency Drugs available, Suction available and Patient being monitored Patient Re-evaluated:Patient Re-evaluated prior to induction Oxygen Delivery Method: Ambu bag Induction Type: IV induction Placement Confirmation: positive ETCO2 and breath sounds checked- equal and bilateral Dental Injury: Teeth and Oropharynx as per pre-operative assessment

## 2021-04-02 NOTE — Procedures (Signed)
Procedure: Electrical Cardioversion Indications:  Atrial Flutter  Procedure Details:  Consent: Risks of procedure as well as the alternatives and risks of each were explained to the (patient/caregiver).  Consent for procedure obtained.  Time Out: Verified patient identification, verified procedure, site/side was marked, verified correct patient position, special equipment/implants available, medications/allergies/relevent history reviewed, required imaging and test results available. PERFORMED.  Patient placed on cardiac monitor, pulse oximetry, supplemental oxygen as necessary.  Sedation given:  Propofol 100mg ; lidocaine 20mg  Pacer pads placed anterior and posterior chest.  Cardioverted 1 time(s).  Cardioversion with synchronized biphasic 150J shock.  Evaluation: Findings: Post procedure EKG shows:  Sinus brady to NSR with PACs Complications: None Patient did tolerate procedure well.  Time Spent Directly with the Patient:    04/02/2021, 8:40 AM

## 2021-04-02 NOTE — Interval H&P Note (Signed)
History and Physical Interval Note:  04/02/2021 8:29 AM  Penny Reed  has presented today for surgery, with the diagnosis of afib.  The various methods of treatment have been discussed with the patient and family. After consideration of risks, benefits and other options for treatment, the patient has consented to  Procedure(s): CARDIOVERSION (N/A) as a surgical intervention.  The patient's history has been reviewed, patient examined, no change in status, stable for surgery.  I have reviewed the patient's chart and labs.  Questions were answered to the patient's satisfaction.     Meriam Sprague

## 2021-04-02 NOTE — Transfer of Care (Signed)
Immediate Anesthesia Transfer of Care Note  Patient: Penny Reed  Procedure(s) Performed: CARDIOVERSION  Patient Location: Endoscopy Unit  Anesthesia Type:General  Level of Consciousness: awake, alert  and oriented  Airway & Oxygen Therapy: Patient Spontanous Breathing  Post-op Assessment: Report given to RN, Post -op Vital signs reviewed and stable and Patient moving all extremities X 4  Post vital signs: Reviewed and stable  Last Vitals:  Vitals Value Taken Time  BP    Temp    Pulse 65 04/02/21 0842  Resp 16 04/02/21 0842  SpO2 93 % 04/02/21 0842  Vitals shown include unvalidated device data.  Last Pain:  Vitals:   04/02/21 0756  TempSrc: Temporal  PainSc: 0-No pain         Complications: No notable events documented.

## 2021-04-03 ENCOUNTER — Other Ambulatory Visit: Payer: Self-pay | Admitting: Family Medicine

## 2021-04-03 DIAGNOSIS — E039 Hypothyroidism, unspecified: Secondary | ICD-10-CM

## 2021-04-03 DIAGNOSIS — E7800 Pure hypercholesterolemia, unspecified: Secondary | ICD-10-CM

## 2021-04-03 NOTE — Telephone Encounter (Signed)
Requested Prescriptions  Pending Prescriptions Disp Refills  . levothyroxine (SYNTHROID) 112 MCG tablet [Pharmacy Med Name: LEVOTHYROXINE 112 MCG TABLET] 100 tablet 2    Sig: TAKE 1 TABLET BY MOUTH SIX DAYS PER WEEK. TAKE 1 AND 1/2 TABLET BY MOUTH ONE DAY PER WEEK.     Endocrinology:  Hypothyroid Agents Failed - 04/03/2021 10:18 AM      Failed - TSH needs to be rechecked within 3 months after an abnormal result. Refill until TSH is due.      Passed - TSH in normal range and within 360 days    TSH  Date Value Ref Range Status  03/23/2021 2.310 0.450 - 4.500 uIU/mL Final         Passed - Valid encounter within last 12 months    Recent Outpatient Visits          1 week ago Essential hypertension   [facility] Maple Hudson., MD   6 months ago Annual physical exam   [facility] Maple Hudson., MD   1 year ago Arteriosclerosis of coronary artery   [facility] Maple Hudson., MD   1 year ago Perforation of right tympanic membrane   [facility] Chrismon, Jodell Cipro, PA-C   1 year ago Annual physical exam   [facility] Maple Hudson., MD             . atorvastatin (LIPITOR) 80 MG tablet [Pharmacy Med Name: ATORVASTATIN 80 MG TABLET] 90 tablet 3    Sig: TAKE 1 TABLET BY MOUTH EVERY DAY     Cardiovascular:  Antilipid - Statins Passed - 04/03/2021 10:18 AM      Passed - Total Cholesterol in normal range and within 360 days    Cholesterol, Total  Date Value Ref Range Status  03/23/2021 172 100 - 199 mg/dL Final         Passed - LDL in normal range and within 360 days    LDL Chol Calc (NIH)  Date Value Ref Range Status  03/23/2021 88 0 - 99 mg/dL Final         Passed - HDL in normal range and within 360 days    HDL  Date Value Ref Range Status  03/23/2021 71 >39 mg/dL Final         Passed - Triglycerides in normal range and within 360 days    Triglycerides  Date Value  Ref Range Status  03/23/2021 69 0 - 149 mg/dL Final         Passed - Patient is not pregnant      Passed - Valid encounter within last 12 months    Recent Outpatient Visits          1 week ago Essential hypertension   [facility] Maple Hudson., MD   6 months ago Annual physical exam   [facility] Maple Hudson., MD   1 year ago Arteriosclerosis of coronary artery   [facility] Maple Hudson., MD   1 year ago Perforation of right tympanic membrane   [facility] Chrismon, Jodell Cipro, PA-C   1 year ago Annual physical exam   [facility] Maple Hudson., MD

## 2021-04-05 ENCOUNTER — Encounter (HOSPITAL_COMMUNITY): Payer: Self-pay | Admitting: Cardiology

## 2021-04-06 NOTE — Anesthesia Postprocedure Evaluation (Signed)
Anesthesia Post Note  Patient: Penny Reed  Procedure(s) Performed: CARDIOVERSION     Patient location during evaluation: PACU Anesthesia Type: General Level of consciousness: awake and alert Pain management: pain level controlled Vital Signs Assessment: post-procedure vital signs reviewed and stable Respiratory status: spontaneous breathing, nonlabored ventilation, respiratory function stable and patient connected to nasal cannula oxygen Cardiovascular status: blood pressure returned to baseline and stable Postop Assessment: no apparent nausea or vomiting Anesthetic complications: no   No notable events documented.  Last Vitals:  Vitals:   04/02/21 0854 04/02/21 0903  BP: (!) 107/43 (!) 114/56  Pulse: 63 61  Resp: 12 13  Temp:    SpO2: 97% 99%    Last Pain:  Vitals:   04/02/21 0903  TempSrc:   PainSc: 0-No pain                 Kennieth Rad

## 2021-04-09 ENCOUNTER — Other Ambulatory Visit: Payer: Self-pay | Admitting: Family Medicine

## 2021-04-14 ENCOUNTER — Encounter: Payer: Self-pay | Admitting: Cardiology

## 2021-04-24 DIAGNOSIS — J449 Chronic obstructive pulmonary disease, unspecified: Secondary | ICD-10-CM

## 2021-04-27 NOTE — Progress Notes (Signed)
Electrophysiology Office Follow up Visit Note:    Date:  04/28/2021   ID:  Penny Reed, DOB [birth year], MRN [id number]  PCP:  Maple Hudson., MD  [facility] Cardiologist:  None  [facility] Electrophysiologist:  Lanier Prude, MD    Interval History:    Penny Reed is a 76 y.o. female who presents for a follow up visit. She had an an EP study 09/04/2020 during which multiple atrial flutter circuits were observed alongside AF.   Dofetilide was discussed with the patient but she wished to continue with a watchful waiting approach.   I saw her back 03/17/2021. A cardioversion was scheduled for recurrent AFL but she again declined antiarrhythmic drug therapy.  Cardioversion was performed 04/02/2021.  On 04/14/2021 patient reported recurrent episodes of AF.   Today she reports daily symptomatic atrial fibrillation/flutter episodes. They last for a few hours at a time and are without clear trigger. She takes xarelto without bleeding issues.     Past Medical History:  Diagnosis Date   Allergic rhinitis    CAD (coronary artery disease)    Complication of anesthesia    woke up during colonoscopy   Hyperlipidemia    Hypertension    Hypothyroidism    LVH (left ventricular hypertrophy)     Past Surgical History:  Procedure Laterality Date   ABDOMINAL HYSTERECTOMY     has left ovary   APPENDECTOMY     CARDIAC CATHETERIZATION  09/2005   had 1 stent and 3 balloons placed   CARDIOVERSION N/A 04/02/2021   Procedure: CARDIOVERSION;  Surgeon: Meriam Sprague, MD;  Location: [facility] ENDOSCOPY;  Service: Cardiovascular;  Laterality: N/A;   COLONOSCOPY WITH PROPOFOL N/A 12/04/2019   Procedure: COLONOSCOPY WITH PROPOFOL;  Surgeon: Earline Mayotte, MD;  Location: [facility] ENDOSCOPY;  Service: Endoscopy;  Laterality: N/A;   CORONARY ARTERY BYPASS GRAFT     SVT ABLATION N/A 09/04/2020   Procedure: SVT ABLATION;  Surgeon: Lanier Prude, MD;  Location: [facility] INVASIVE CV  LAB;  Service: Cardiovascular;  Laterality: N/A;   TONSILLECTOMY      Current Medications: Current Meds  Medication Sig   albuterol (PROAIR HFA) 108 (90 BASE) MCG/ACT inhaler Inhale 2 puffs into the lungs every 6 (six) hours as needed for wheezing or shortness of breath.   amLODipine (NORVASC) 5 MG tablet TAKE 1 TABLET BY MOUTH EVERY DAY   atorvastatin (LIPITOR) 80 MG tablet TAKE 1 TABLET BY MOUTH EVERY DAY   Azelastine HCl 137 MCG/SPRAY SOLN SPRAY 2 SPRAYS INTO EACH NOSTRIL EVERY DAY (Patient taking differently: Place 2 sprays into both nostrils at bedtime.)   Calcium Carbonate (CALCIUM 600 PO) Take 1,200 mg by mouth in the morning and at bedtime.   cetirizine (ZYRTEC) 10 MG tablet Take 10 mg by mouth daily.   cholecalciferol (VITAMIN D3) 25 MCG (1000 UNIT) tablet Take 1,000 Units by mouth daily.   levothyroxine (SYNTHROID) 112 MCG tablet TAKE 1 TABLET BY MOUTH SIX DAYS PER WEEK. TAKE 1 AND 1/2 TABLET BY MOUTH ONE DAY PER WEEK.   Multiple Vitamin (MULTIVITAMIN) tablet Take 1 tablet by mouth daily. Standard Process   nitroGLYCERIN (NITROSTAT) 0.4 MG SL tablet Place 0.4 mg under the tongue every 5 (five) minutes as needed for chest pain.    Omega-3 Fatty Acids (FISH OIL) 1000 MG CAPS Take 3,000 mg by mouth at bedtime.   Probiotic Product (ACIDOPHILUS PROBIOTIC BLEND PO) Take 1 capsule by mouth daily.   quinapril (ACCUPRIL) 40  MG tablet TAKE 1 TABLET BY MOUTH EVERY DAY   rivaroxaban (XARELTO) 20 MG TABS tablet TAKE 1 TABLET BY MOUTH DAILY WITH SUPPER. STOP ELIQUIS   umeclidinium-vilanterol (ANORO ELLIPTA) 62.5-25 MCG/ACT AEPB INHALE 1 PUFF BY MOUTH EVERY DAY   zolpidem (AMBIEN) 5 MG tablet TAKE 1 TABLET BY MOUTH AT BEDTIME AS NEEDED FOR SLEEP.     Allergies:   Adhesive [tape], Azithromycin, and Eliquis [apixaban]   Social History   Socioeconomic History   Marital status: Married    Spouse name: Not on file   Number of children: 3   Years of education: Not on file   Highest education  level: Associate degree: occupational, Scientist, product/process developmenttechnical, or vocational program  Occupational History   Not on file  Tobacco Use   Smoking status: Former    Packs/day: 0.25    Years: 25.00    Pack years: 6.25    Types: Cigarettes    Quit date: 04/25/2005    Years since quitting: 16.0   Smokeless tobacco: Never  Vaping Use   Vaping Use: Never used  Substance and Sexual Activity   Alcohol use: Yes    Alcohol/week: 5.0 standard drinks    Types: 5 Glasses of wine per week    Comment: spread out between 2-3 nights a week   Drug use: No   Sexual activity: Not on file  Other Topics Concern   Not on file  Social History Narrative   Not on file   Social Determinants of Health   Financial Resource Strain: Low Risk    Difficulty of Paying Living Expenses: Not hard at all  Food Insecurity: Not on file  Transportation Needs: Not on file  Physical Activity: Not on file  Stress: Not on file  Social Connections: Not on file     Family History: The patient's family history includes Asthma in her sister; Breast cancer in her maternal aunt; Breast cancer (age of onset: 7163) in her sister; Cancer in her sister; Cancer - Colon in her paternal grandmother; Coronary artery disease in her father; Diabetes in her mother; Heart attack in her brother and father; Hyperlipidemia in her sister; Hypertension in her sister; Scleroderma in her sister; Stroke in her mother; Thyroid disease in her sister.  ROS:   Please see the history of present illness.    All other systems reviewed and are negative.  EKGs/Labs/Other Studies Reviewed:    The following studies were reviewed today:  Cardiac MRI   EKG:  The ekg ordered today demonstrates sinus bradycardia  Recent Labs: 03/23/2021: ALT 23; BUN 17; Creatinine, Ser 0.75; Hemoglobin 13.5; Platelets 203; Potassium 4.4; Sodium 140; TSH 2.310  Recent Lipid Panel    Component Value Date/Time   CHOL 172 03/23/2021 0900   TRIG 69 03/23/2021 0900   HDL 71  03/23/2021 0900   CHOLHDL 2.4 03/23/2021 0900   LDLCALC 88 03/23/2021 0900    Physical Exam:    VS:  BP 128/60 (BP Location: Left Arm, Patient Position: Sitting, Cuff Size: Normal)    Pulse (!) 46    Ht 5\' 7"  (1.702 m)    Wt 183 lb (83 kg)    SpO2 97%    BMI 28.66 kg/m     Wt Readings from Last 3 Encounters:  04/28/21 183 lb (83 kg)  04/02/21 180 lb (81.6 kg)  03/23/21 180 lb (81.6 kg)     GEN:  Well nourished, well developed in no acute distress HEENT: Normal NECK: No JVD; No carotid  bruits LYMPHATICS: No lymphadenopathy CARDIAC: RRR, no murmurs, rubs, gallops RESPIRATORY:  Clear to auscultation without rales, wheezing or rhonchi  ABDOMEN: Soft, non-tender, non-distended MUSCULOSKELETAL:  No edema; No deformity  SKIN: Warm and dry NEUROLOGIC:  Alert and oriented x 3 PSYCHIATRIC:  Normal affect        ASSESSMENT:    1. Atypical atrial flutter (HCC)   2. Coronary artery disease involving native coronary artery of native heart without angina pectoris   3. Hx of CABG   4. Persistent atrial fibrillation (HCC)    PLAN:    In order of problems listed above:  #Atrial flutter #Paroxysmal atrial fibrillation The patient reports a crescendo and symptomatic atrial arrhythmias.  Given her prior EP study results, these are likely both atrial fibrillation and flutter episodes.  We discussed the pathophysiology of her arrhythmia in detail during today's visit.  We discussed how she is between paroxysmal and persistent.  We discussed the available treatment options including using dofetilide for rhythm control versus catheter ablation for rhythm control.  We discussed the success rates for the 2 options.  We discussed the catheter ablation procedure in detail include the risk, recovery and likelihood of success.  We discussed the need for PVI plus CTI plus ablation of any atypical flutter circuits.  The patient and her husband had many questions that were answered during today's  appointment.  After our discussion, they would like some time to think through the options which I think is very reasonable.  They will let us know how they would like to proceed.  I will update her echocardiogram now.  If she elects to proceed with catheter ablation, will need a CT scan.  Risk, benefits, and alternatives to EP study and radiofrequency ablation for afib were also discussed in detail today. These risks include but are not limited to stroke, bleeding, vascular damage, tamponade, perforation, damage to the esophagus, lungs, and other structures, pulmonary vein stenosis, worsening renal function, and death. The patient understands these risks. Carto, ICE, anesthesia would be required for the procedure.  CT PV protocol also would be required prior to the procedure to exclude LAA thrombus and further evaluate atrial anatomy.  I truly believe either option is reasonable for her.  We discussed how the matter what choice she makes, we could always transition to the alternative down the road if she continues to have symptomatic arrhythmias.  #Coronary artery disease post bypass No ischemic symptoms.  There is scar on the cardiac MRI that corresponds to her coronary history.  Follow-up with me in 6 months or sooner should she decide to proceed with Tikosyn or catheter ablation.        Total time spent with patient today 45 minutes. This includes reviewing records, evaluating the patient and coordinating care.   Medication Adjustments/Labs and Tests Ordered: Current medicines are reviewed at length with the patient today.  Concerns regarding medicines are outlined above.  Orders Placed This Encounter  Procedures   EKG 12-Lead   ECHOCARDIOGRAM COMPLETE   No orders of the defined types were placed in this encounter.    Signed, Steffanie Dunn, MD, [facility], [facility] 04/28/2021 11:43 AM    Electrophysiology [facility]

## 2021-04-28 ENCOUNTER — Encounter: Payer: Self-pay | Admitting: Cardiology

## 2021-04-28 ENCOUNTER — Other Ambulatory Visit: Payer: Self-pay

## 2021-04-28 ENCOUNTER — Ambulatory Visit: Payer: Medicare HMO | Admitting: Cardiology

## 2021-04-28 VITALS — BP 128/60 | HR 46 | Ht 67.0 in | Wt 183.0 lb

## 2021-04-28 DIAGNOSIS — I484 Atypical atrial flutter: Secondary | ICD-10-CM

## 2021-04-28 DIAGNOSIS — I4819 Other persistent atrial fibrillation: Secondary | ICD-10-CM | POA: Diagnosis not present

## 2021-04-28 DIAGNOSIS — I2510 Atherosclerotic heart disease of native coronary artery without angina pectoris: Secondary | ICD-10-CM | POA: Diagnosis not present

## 2021-04-28 DIAGNOSIS — Z951 Presence of aortocoronary bypass graft: Secondary | ICD-10-CM | POA: Diagnosis not present

## 2021-04-28 NOTE — Patient Instructions (Addendum)
Your physician recommends that you continue on your current medications as directed. Please refer to the Current Medication list given to you today. *If you need a refill on your cardiac medications before your next appointment, please call your pharmacy*  Lab Work: None. If you have labs (blood work) drawn today and your tests are completely normal, you will receive your results only by: [place] (if you have MyChart) OR A paper copy in the mail If you have any lab test that is abnormal or we need to change your treatment, we will call you to review the results.  Testing/Procedures: Your physician has requested that you have an echocardiogram. Echocardiography is a painless test that uses sound waves to create images of your heart. It provides your doctor with information about the size and shape of your heart and how well your hearts chambers and valves are working. This procedure takes approximately one hour. There are no restrictions for this procedure.   Follow-Up: At [facility], you and your health needs are our priority.  As part of our continuing mission to provide you with exceptional heart care, we have created designated Provider Care Teams.  These Care Teams include your primary Cardiologist (physician) and Advanced Practice Providers (APPs -  Physician Assistants and Nurse Practitioners) who all work together to provide you with the care you need, when you need it.  Your physician wants you to follow-up in: 6 months with Dr. Lars Mage. Please call the office if you would like to set up Tikosyn or ablation prior to that visit.     You will receive a reminder letter in the mail two months in advance. If you don't receive a letter, please call our office to schedule the follow-up appointment.  We recommend signing up for the patient portal called "MyChart".  Sign up information is provided on this After Visit Summary.  MyChart is used to connect with patients for  Virtual Visits (Telemedicine).  Patients are able to view lab/test results, encounter notes, upcoming appointments, etc.  Non-urgent messages can be sent to your provider as well.   To learn more about what you can do with MyChart, go to [email or web address].    Any Other Special Instructions Will Be Listed Below (If Applicable).  Cardiac Ablation Cardiac ablation is a procedure to destroy (ablate) some heart tissue that is sending bad signals. These bad signals cause problems in heart rhythm. The heart has many areas that make these signals. If there are problems in these areas, they can make the heart beat in a way that is not normal. Destroying some tissues can help make the heart rhythm normal. Tell your doctor about: Any allergies you have. All medicines you are taking. These include vitamins, herbs, eye drops, creams, and over-the-counter medicines. Any problems you or family members have had with medicines that make you fall asleep (anesthetics). Any blood disorders you have. Any surgeries you have had. Any medical conditions you have, such as kidney failure. Whether you are pregnant or may be pregnant. What are the risks? This is a safe procedure. But problems may occur, including: Infection. Bruising and bleeding. Bleeding into the chest. Stroke or blood clots. Damage to nearby areas of your body. Allergies to medicines or dyes. The need for a pacemaker if the normal system is damaged. Failure of the procedure to treat the problem. What happens before the procedure? Medicines Ask your doctor about: Changing or stopping your normal medicines. This is important. Taking aspirin and ibuprofen.  Do not take these medicines unless your doctor tells you to take them. Taking other medicines, vitamins, herbs, and supplements. General instructions Follow instructions from your doctor about what you cannot eat or drink. Plan to have someone take you home from the hospital or  clinic. If you will be going home right after the procedure, plan to have someone with you for 24 hours. Ask your doctor what steps will be taken to prevent infection. What happens during the procedure?  An IV tube will be put into one of your veins. You will be given a medicine to help you relax. The skin on your neck or groin will be numbed. A cut (incision) will be made in your neck or groin. A needle will be put through your cut and into a large vein. A tube (catheter) will be put into the needle. The tube will be moved to your heart. Dye may be put through the tube. This helps your doctor see your heart. Small devices (electrodes) on the tube will send out signals. A type of energy will be used to destroy some heart tissue. The tube will be taken out. Pressure will be held on your cut. This helps stop bleeding. A bandage will be put over your cut. The exact procedure may vary among doctors and hospitals. What happens after the procedure? You will be watched until you leave the hospital or clinic. This includes checking your heart rate, breathing rate, oxygen, and blood pressure. Your cut will be watched for bleeding. You will need to lie still for a few hours. Do not drive for 24 hours or as long as your doctor tells you. Summary Cardiac ablation is a procedure to destroy some heart tissue. This is done to treat heart rhythm problems. Tell your doctor about any medical conditions you may have. Tell him or her about all medicines you are taking to treat them. This is a safe procedure. But problems may occur. These include infection, bruising, bleeding, and damage to nearby areas of your body. Follow what your doctor tells you about food and drink. You may also be told to change or stop some of your medicines. After the procedure, do not drive for 24 hours or as long as your doctor tells you. This information is not intended to replace advice given to you by your health care provider.  Make sure you discuss any questions you have with your health care provider. Document Revised: 03/14/2019 Document Reviewed: 03/14/2019 Elsevier Patient Education  2022 [place]. Dofetilide capsules What is this medication? DOFETILIDE (doe FET il ide) is an antiarrhythmic drug. It helps make your heart beat regularly. This medicine also helps to slow rapid heartbeats. This medicine may be used for other purposes; ask your health care provider or pharmacist if you have questions. COMMON BRAND NAME(S): Tikosyn What should I tell my care team before I take this medication? They need to know if you have any of these conditions: heart disease history of irregular heartbeat history of low levels of potassium or magnesium in the blood kidney disease liver disease an unusual or allergic reaction to dofetilide, other medicines, foods, dyes, or preservatives pregnant or trying to get pregnant breast-feeding How should I use this medication? Take this medicine by mouth with a glass of water. Follow the directions on the prescription label. Do not take with grapefruit juice. You can take it with or without food. If it upsets your stomach, take it with food. Take your medicine at regular intervals.  Do not take it more often than directed. Do not stop taking except on your doctor's advice. A special MedGuide will be given to you by the pharmacist with each prescription and refill. Be sure to read this information carefully each time. Talk to your pediatrician regarding the use of this medicine in children. Special care may be needed. Overdosage: If you think you have taken too much of this medicine contact a poison control center or emergency room at once. NOTE: This medicine is only for you. Do not share this medicine with others. What if I miss a dose? If you miss a dose, skip it. Take your next dose at the normal time. Do not take extra or 2 doses at the same time to make up for the missed  dose. What may interact with this medication? Do not take this medicine with any of the following medications: cimetidine cisapride dolutegravir dronedarone erdafitinib hydrochlorothiazide ketoconazole megestrol pimozide prochlorperazine thioridazine trimethoprim verapamil This medicine may also interact with the following medications: amiloride cannabinoids certain antibiotics like erythromycin or clarithromycin certain antiviral medicines for HIV or hepatitis certain medicines for depression, anxiety, or psychotic disorders digoxin diltiazem grapefruit juice metformin nefazodone other medicines that prolong the QT interval (an abnormal heart rhythm) quinine triamterene zafirlukast ziprasidone This list may not describe all possible interactions. Give your health care provider a list of all the medicines, herbs, non-prescription drugs, or dietary supplements you use. Also tell them if you smoke, drink alcohol, or use illegal drugs. Some items may interact with your medicine. What should I watch for while using this medication? Your condition will be monitored carefully while you are receiving this medicine. What side effects may I notice from receiving this medication? Side effects that you should report to your doctor or health care professional as soon as possible: allergic reactions like skin rash, itching or hives, swelling of the face, lips, or tongue breathing problems chest pain or chest tightness dizziness signs and symptoms of a dangerous change in heartbeat or heart rhythm like chest pain; dizziness; fast or irregular heartbeat; palpitations; feeling faint or lightheaded, falls; breathing problems signs and symptoms of electrolyte imbalance like severe diarrhea, unusual sweating, vomiting, loss of appetite, increased thirst swelling of the ankles, legs, or feet tingling, numbness in the hands or feet Side effects that usually do not require medical attention  (report to your doctor or health care professional if they continue or are bothersome): diarrhea general ill feeling or flu-like symptoms headache nausea trouble sleeping stomach pain This list may not describe all possible side effects. Call your doctor for medical advice about side effects. You may report side effects to FDA at 1-800-FDA-1088. Where should I keep my medication? Keep out of the reach of children. Store at room temperature between 15 and 30 degrees C (59 and 86 degrees F). Throw away any unused medicine after the expiration date. NOTE: This sheet is a summary. It may not cover all possible information. If you have questions about this medicine, talk to your doctor, pharmacist, or health care provider.  2022 Elsevier/Gold Standard (2020-12-29 00:00:00)

## 2021-04-29 ENCOUNTER — Telehealth: Payer: Self-pay | Admitting: Cardiology

## 2021-04-29 DIAGNOSIS — I4891 Unspecified atrial fibrillation: Secondary | ICD-10-CM

## 2021-04-29 DIAGNOSIS — Z01818 Encounter for other preprocedural examination: Secondary | ICD-10-CM

## 2021-04-29 NOTE — Telephone Encounter (Signed)
°  Per MyChart scheduling message:  I do not need an appointment for an office visit.  I would like to move forward with the cardiac ablation we discussed during my office visit 04/28/21.  My echocardiogram is scheduled May 25, 2021. Thank you, Penny Reed [birth year] [phone] [email or web address]

## 2021-05-04 ENCOUNTER — Telehealth: Payer: Self-pay

## 2021-05-04 NOTE — Progress Notes (Signed)
Chronic Care Management Pharmacy Assistant   Name: Penny Reed  MRN: [id number] DOB: 12-18-1945  Reason for Encounter: COPD Disease State Call/PAP Status for [facility]   Recent office visits:  03/23/2021 Penny Manson, MD (PCP Office Visit) for Follow-up- No medication changes noted, Lab orders place, patient instructed to follow-up in 6 months  Recent consult visits:  04/28/2021 Penny Dunn, MD (Cardiology) for Follow-up- No medication changes noted, EKG 12-Lead order placed, Echocardiogram Complete order placed, patient to follow-up in 6 months  03/17/2021 Penny Dunn, MD (Cardiology) for Atypical Flutter- No medication changes noted, EKG 12-Lead ordered, no follow-up noted  Hospital visits:  Medication Reconciliation was completed by comparing discharge summary, patients EMR and Pharmacy list, and upon discussion with patient.  Admitted to the hospital on 04/02/2021 due to Atrial Flutter. Discharge date was 04/02/2021. Discharged from [facility].    New?Medications Started at [facility] Discharge:?? -Started None ID  Medication Changes at Hospital Discharge: -Changed None ID  Medications Discontinued at Hospital Discharge: -Stopped None ID  Medications that remain the same after Hospital Discharge:??  -All other medications will remain the same.    Medications: Outpatient Encounter Medications as of 05/04/2021  Medication Sig   albuterol (PROAIR HFA) 108 (90 BASE) MCG/ACT inhaler Inhale 2 puffs into the lungs every 6 (six) hours as needed for wheezing or shortness of breath.   amLODipine (NORVASC) 5 MG tablet TAKE 1 TABLET BY MOUTH EVERY DAY   atorvastatin (LIPITOR) 80 MG tablet TAKE 1 TABLET BY MOUTH EVERY DAY   Azelastine HCl 137 MCG/SPRAY SOLN SPRAY 2 SPRAYS INTO EACH NOSTRIL EVERY DAY (Patient taking differently: Place 2 sprays into both nostrils at bedtime.)   Calcium Carbonate (CALCIUM 600 PO) Take 1,200 mg by mouth in the morning and at  bedtime.   cetirizine (ZYRTEC) 10 MG tablet Take 10 mg by mouth daily.   cholecalciferol (VITAMIN D3) 25 MCG (1000 UNIT) tablet Take 1,000 Units by mouth daily.   furosemide (LASIX) 40 MG tablet Take 20 mg by mouth daily as needed for edema. (Patient not taking: Reported on 04/28/2021)   levothyroxine (SYNTHROID) 112 MCG tablet TAKE 1 TABLET BY MOUTH SIX DAYS PER WEEK. TAKE 1 AND 1/2 TABLET BY MOUTH ONE DAY PER WEEK.   Multiple Vitamin (MULTIVITAMIN) tablet Take 1 tablet by mouth daily. Standard Process   nitroGLYCERIN (NITROSTAT) 0.4 MG SL tablet Place 0.4 mg under the tongue every 5 (five) minutes as needed for chest pain.    Omega-3 Fatty Acids (FISH OIL) 1000 MG CAPS Take 3,000 mg by mouth at bedtime.   Probiotic Product (ACIDOPHILUS PROBIOTIC BLEND PO) Take 1 capsule by mouth daily.   quinapril (ACCUPRIL) 40 MG tablet TAKE 1 TABLET BY MOUTH EVERY DAY   rivaroxaban (XARELTO) 20 MG TABS tablet TAKE 1 TABLET BY MOUTH DAILY WITH SUPPER. STOP ELIQUIS   umeclidinium-vilanterol (ANORO ELLIPTA) 62.5-25 MCG/ACT AEPB INHALE 1 PUFF BY MOUTH EVERY DAY   zolpidem (AMBIEN) 5 MG tablet TAKE 1 TABLET BY MOUTH AT BEDTIME AS NEEDED FOR SLEEP.   No facility-administered encounter medications on file as of 05/04/2021.   Care Gaps: Zoster Vaccine COVID-19 Vaccine Booster 3  Star Rating Drugs: Atorvastatin 80 mg last filled on 03/21/2021 for a 90-Day supply with [facility] Quinapril 40 mg last filled on 03/23/2021 for a 90-Day supply with [facility]   Current COPD regimen:  Albuterol HFA 2 puffs every 6 hours as needed Anoro 1 puff daily   Spoke to [employer]  regarding patient assistance for Anoro, and according to the representative the denied patient based on income. They have a system that gives them an estimate income level that she did not pass. Per the representative the patient can submit her SSI letter to be reconsidered as it would be an exact amount of her income and could make a difference in the  decision.  No flowsheet data found.  Any recent hospitalizations or ED visits since last visit with CPP? Yes  Reports denies COPD symptoms, including Increased shortness of breath , Rescue medicine is not helping, Shortness of breath at rest, Symptoms worse with exercise, Symptoms worse at night, and Wheezing  What recent interventions/DTPs have been made by any provider to improve breathing since last visit: None ID  Have you had exacerbation/flare-up since last visit? No  What do you do when you are short of breath?  Adhere to COPD Action Plan, Rescue medication, and Rest  Respiratory Devices/Equipment Do you have a nebulizer? No Do you use a Peak Flow Meter? No Do you use a maintenance inhaler? Yes How often do you forget to use your daily inhaler? Never Do you use a rescue inhaler? Yes How often do you use your rescue inhaler?  infrequently Do you use a spacer with your inhaler? No  Adherence Review: Does the patient have >5 day gap between last estimated fill date for maintenance inhaler medications? No  I spoke with the patient, and she reports that she is doing well. Patient denies any ill symptoms, and reports taking all medications as directed. Patient has no concerns or issues. Patient will e-mail her SSI statement so we can fax it over for to [employer] to provide them with her proof of Income for her Anoro.  05/19/2021  Spoke with the [employer] representative who confirmed that although they did receive the patient's proof of income now that it's 2023 the $600.00 out-of pocket spending on medications has reset. The patient will need to spend $600.00 out-of-pocket on her medications for 2023, and submit that proof over along with another application. CPP has been notified as well as the patient.   Patient has telephone appointment with Penny Reed, CPP on 10/05/2021 @ [place], CPA/CMA Catering manager Phone: [phone]

## 2021-05-05 ENCOUNTER — Encounter: Payer: Self-pay | Admitting: *Deleted

## 2021-05-05 NOTE — Telephone Encounter (Signed)
Spoke to the patient about ablation, lab, and CT. Got work up complete. Sent mychart message with instructions and called the patient back going over those instructions. Verbalized understanding.

## 2021-05-11 ENCOUNTER — Other Ambulatory Visit (INDEPENDENT_AMBULATORY_CARE_PROVIDER_SITE_OTHER): Payer: Medicare HMO

## 2021-05-11 ENCOUNTER — Other Ambulatory Visit: Payer: Self-pay

## 2021-05-11 DIAGNOSIS — Z01818 Encounter for other preprocedural examination: Secondary | ICD-10-CM | POA: Diagnosis not present

## 2021-05-11 DIAGNOSIS — I4891 Unspecified atrial fibrillation: Secondary | ICD-10-CM | POA: Diagnosis not present

## 2021-05-12 LAB — CBC WITH DIFFERENTIAL/PLATELET
Basophils Absolute: 0 10*3/uL (ref 0.0–0.2)
Basos: 0 %
EOS (ABSOLUTE): 0.1 10*3/uL (ref 0.0–0.4)
Eos: 2 %
Hematocrit: 40.3 % (ref 34.0–46.6)
Hemoglobin: 13.7 g/dL (ref 11.1–15.9)
Immature Grans (Abs): 0 10*3/uL (ref 0.0–0.1)
Immature Granulocytes: 0 %
Lymphocytes Absolute: 1.7 10*3/uL (ref 0.7–3.1)
Lymphs: 28 %
MCH: 32.3 pg (ref 26.6–33.0)
MCHC: 34 g/dL (ref 31.5–35.7)
MCV: 95 fL (ref 79–97)
Monocytes Absolute: 0.5 10*3/uL (ref 0.1–0.9)
Monocytes: 9 %
Neutrophils Absolute: 3.5 10*3/uL (ref 1.4–7.0)
Neutrophils: 61 %
Platelets: 216 10*3/uL (ref 150–450)
RBC: 4.24 x10E6/uL (ref 3.77–5.28)
RDW: 12 % (ref 11.7–15.4)
WBC: 5.8 10*3/uL (ref 3.4–10.8)

## 2021-05-12 LAB — BASIC METABOLIC PANEL
BUN/Creatinine Ratio: 21 (ref 12–28)
BUN: 16 mg/dL (ref 8–27)
CO2: 23 mmol/L (ref 20–29)
Calcium: 9.8 mg/dL (ref 8.7–10.3)
Chloride: 101 mmol/L (ref 96–106)
Creatinine, Ser: 0.76 mg/dL (ref 0.57–1.00)
Glucose: 101 mg/dL — ABNORMAL HIGH (ref 70–99)
Potassium: 4.8 mmol/L (ref 3.5–5.2)
Sodium: 140 mmol/L (ref 134–144)
eGFR: 83 mL/min/{1.73_m2} (ref >59)

## 2021-05-24 ENCOUNTER — Encounter (HOSPITAL_COMMUNITY): Payer: Self-pay

## 2021-05-24 ENCOUNTER — Telehealth (HOSPITAL_COMMUNITY): Payer: Self-pay | Admitting: Emergency Medicine

## 2021-05-24 NOTE — Telephone Encounter (Signed)
Reaching out to patient to offer assistance regarding upcoming cardiac imaging study; pt verbalizes understanding of appt date/time, parking situation and where to check in, pre-test NPO status and medications ordered, and verified current allergies; name and call back number provided for further questions should they arise Penny Bond RN Navigator Cardiac Imaging [facility] [phone] office [phone] cell  Mychart message  Denies iv issues Arrival 100

## 2021-05-25 ENCOUNTER — Ambulatory Visit (INDEPENDENT_AMBULATORY_CARE_PROVIDER_SITE_OTHER): Payer: Medicare HMO

## 2021-05-25 ENCOUNTER — Other Ambulatory Visit: Payer: Self-pay

## 2021-05-25 DIAGNOSIS — I2510 Atherosclerotic heart disease of native coronary artery without angina pectoris: Secondary | ICD-10-CM

## 2021-05-25 DIAGNOSIS — I484 Atypical atrial flutter: Secondary | ICD-10-CM | POA: Diagnosis not present

## 2021-05-25 DIAGNOSIS — Z951 Presence of aortocoronary bypass graft: Secondary | ICD-10-CM

## 2021-05-25 DIAGNOSIS — I4819 Other persistent atrial fibrillation: Secondary | ICD-10-CM | POA: Diagnosis not present

## 2021-05-25 LAB — ECHOCARDIOGRAM COMPLETE
AR max vel: 2.34 cm2
AV Area VTI: 2.26 cm2
AV Area mean vel: 2.32 cm2
AV Mean grad: 6 mmHg
AV Peak grad: 12.5 mmHg
Ao pk vel: 1.77 m/s
Area-P 1/2: 2.66 cm2
MV VTI: 2.41 cm2
P 1/2 time: 606 msec
S' Lateral: 3 cm

## 2021-05-26 ENCOUNTER — Ambulatory Visit (HOSPITAL_COMMUNITY)
Admission: RE | Admit: 2021-05-26 | Discharge: 2021-05-26 | Disposition: A | Discharge status: Home / Self Care | Payer: Medicare HMO | Source: Ambulatory Visit | Attending: Cardiology | Admitting: Cardiology

## 2021-05-26 DIAGNOSIS — I4891 Unspecified atrial fibrillation: Secondary | ICD-10-CM | POA: Diagnosis not present

## 2021-05-26 MED ORDER — IOHEXOL 350 MG/ML SOLN
95.0000 mL | Freq: Once | INTRAVENOUS | Status: AC | PRN
  Administered 2021-05-26: 95 mL via INTRAVENOUS

## 2021-05-28 NOTE — Pre-Procedure Instructions (Signed)
Instructed patient on the following items: Arrival time 0530 Nothing to eat or drink after midnight No meds AM of procedure Responsible person to drive you home and stay with you for 24 hrs  Have you missed any doses of anti-coagulant Xarelto- hasn't missed any doses    

## 2021-05-30 ENCOUNTER — Encounter (HOSPITAL_COMMUNITY): Payer: Self-pay | Admitting: Cardiology

## 2021-05-30 NOTE — Anesthesia Preprocedure Evaluation (Addendum)
Anesthesia Evaluation  Patient identified by MRN, date of birth, ID band Patient awake    Reviewed: Allergy & Precautions, NPO status , Patient's Chart, lab work & pertinent test results, reviewed documented beta blocker date and time   Airway Mallampati: II  TM Distance: >3 FB Neck ROM: Full    Dental no notable dental hx. (+) Caps, Dental Advisory Given   Pulmonary shortness of breath and with exertion, COPD,  COPD inhaler, former smoker,    Pulmonary exam normal breath sounds clear to auscultation       Cardiovascular hypertension, Pt. on medications + angina with exertion + CAD, + Cardiac Stents and + CABG  + dysrhythmias Atrial Fibrillation and Supra Ventricular Tachycardia  Rhythm:Regular Rate:Bradycardia  S/P DCCV  S/P DES x 4 2007 [facility]  S/P 2v CABG 02/2010 [facility] LIMA to LAD, SVG to LCx  Echo 05/25/21 1. Left ventricular ejection fraction, by estimation, is 60 to 65%. The left ventricle has normal function. Unable to exclude small region of hypokinesis in the mid to basal septal wall (wall thinning noted in this region) There is mild to moderate asymmetric left ventricular hypertrophy of the basal-septal and septal segments. Left ventricular diastolic parameters are consistent with Grade II diastolic dysfunction (pseudonormalization).  2. Right ventricular systolic function is normal. The right ventricular size is normal. Mildly increased right ventricular wall thickness.  3. Left atrial size was severely dilated.  4. The mitral valve is normal in structure. Mild mitral valve  regurgitation. No evidence of mitral stenosis.  5. The aortic valve has an indeterminant number of cusps. Aortic valve regurgitation is mild. No aortic stenosis is present.  6. There is mild dilatation of the ascending aorta, measuring 42 mm.  7. The inferior vena cava is normal in size with greater than 50% respiratory variability, suggesting right  atrial pressure of 3 mmHg.   EKG 04/28/21 Sinus Bradycardia, LVH, Septal infarct   Neuro/Psych negative neurological ROS  negative psych ROS   GI/Hepatic Neg liver ROS, GERD  Medicated,  Endo/Other  Hypothyroidism Hyperlipidemia  Renal/GU negative Renal ROS  negative genitourinary   Musculoskeletal negative musculoskeletal ROS (+)   Abdominal   Peds  Hematology Xarelto therapy- last dose yesterday am   Anesthesia Other Findings   Reproductive/Obstetrics                         Anesthesia Physical Anesthesia Plan  ASA: 3  Anesthesia Plan: General   Post-op Pain Management:    Induction: Intravenous  PONV Risk Score and Plan: 3 and Treatment may vary due to age or medical condition and Ondansetron  Airway Management Planned: Oral ETT  Additional Equipment: None  Intra-op Plan:   Post-operative Plan: Extubation in OR  Informed Consent: I have reviewed the patients History and Physical, chart, labs and discussed the procedure including the risks, benefits and alternatives for the proposed anesthesia with the patient or authorized representative who has indicated his/her understanding and acceptance.     Dental advisory given  Plan Discussed with: CRNA and Anesthesiologist  Anesthesia Plan Comments:        Anesthesia Quick Evaluation

## 2021-05-31 ENCOUNTER — Ambulatory Visit (HOSPITAL_COMMUNITY): Payer: Medicare HMO | Admitting: Anesthesiology

## 2021-05-31 ENCOUNTER — Encounter (HOSPITAL_COMMUNITY)
Admission: RE | Disposition: A | Discharge status: Home / Self Care | Payer: Medicare HMO | Source: Ambulatory Visit | Attending: Cardiology

## 2021-05-31 ENCOUNTER — Other Ambulatory Visit: Payer: Self-pay

## 2021-05-31 ENCOUNTER — Ambulatory Visit (HOSPITAL_COMMUNITY)
Admission: RE | Admit: 2021-05-31 | Discharge: 2021-05-31 | Disposition: A | Discharge status: Home / Self Care | Payer: Medicare HMO | Source: Ambulatory Visit | Attending: Cardiology | Admitting: Cardiology

## 2021-05-31 DIAGNOSIS — I10 Essential (primary) hypertension: Secondary | ICD-10-CM | POA: Diagnosis not present

## 2021-05-31 DIAGNOSIS — I2510 Atherosclerotic heart disease of native coronary artery without angina pectoris: Secondary | ICD-10-CM | POA: Insufficient documentation

## 2021-05-31 DIAGNOSIS — Z951 Presence of aortocoronary bypass graft: Secondary | ICD-10-CM | POA: Insufficient documentation

## 2021-05-31 DIAGNOSIS — I25119 Atherosclerotic heart disease of native coronary artery with unspecified angina pectoris: Secondary | ICD-10-CM | POA: Diagnosis not present

## 2021-05-31 DIAGNOSIS — Z7901 Long term (current) use of anticoagulants: Secondary | ICD-10-CM | POA: Diagnosis not present

## 2021-05-31 DIAGNOSIS — I4891 Unspecified atrial fibrillation: Secondary | ICD-10-CM

## 2021-05-31 DIAGNOSIS — I4819 Other persistent atrial fibrillation: Secondary | ICD-10-CM | POA: Insufficient documentation

## 2021-05-31 DIAGNOSIS — E039 Hypothyroidism, unspecified: Secondary | ICD-10-CM | POA: Diagnosis not present

## 2021-05-31 DIAGNOSIS — I483 Typical atrial flutter: Secondary | ICD-10-CM

## 2021-05-31 DIAGNOSIS — I4892 Unspecified atrial flutter: Secondary | ICD-10-CM

## 2021-05-31 DIAGNOSIS — Z87891 Personal history of nicotine dependence: Secondary | ICD-10-CM | POA: Diagnosis not present

## 2021-05-31 HISTORY — PX: ATRIAL FIBRILLATION ABLATION: EP1191

## 2021-05-31 LAB — POCT ACTIVATED CLOTTING TIME
Activated Clotting Time: 341 seconds
Activated Clotting Time: 341 seconds
Activated Clotting Time: 341 seconds

## 2021-05-31 SURGERY — ATRIAL FIBRILLATION ABLATION
Anesthesia: General

## 2021-05-31 MED ORDER — HEPARIN (PORCINE) IN NACL 1000-0.9 UT/500ML-% IV SOLN
INTRAVENOUS | Status: AC
  Filled 2021-05-31: qty 500

## 2021-05-31 MED ORDER — HEPARIN SODIUM (PORCINE) 1000 UNIT/ML IJ SOLN
INTRAMUSCULAR | Status: DC | PRN
  Administered 2021-05-31 (×3): 2000 [IU] via INTRAVENOUS
  Administered 2021-05-31: 12000 [IU] via INTRAVENOUS

## 2021-05-31 MED ORDER — KETOROLAC TROMETHAMINE 30 MG/ML IJ SOLN
30.0000 mg | Freq: Once | INTRAMUSCULAR | Status: AC
  Administered 2021-05-31: 30 mg via INTRAVENOUS
  Filled 2021-05-31: qty 1

## 2021-05-31 MED ORDER — COLCHICINE 0.6 MG PO TABS
0.6000 mg | ORAL_TABLET | Freq: Two times a day (BID) | ORAL | Status: DC
  Administered 2021-05-31: 0.6 mg via ORAL
  Filled 2021-05-31: qty 1

## 2021-05-31 MED ORDER — ACETAMINOPHEN 325 MG PO TABS
650.0000 mg | ORAL_TABLET | ORAL | Status: DC | PRN
  Administered 2021-05-31: 650 mg via ORAL
  Filled 2021-05-31: qty 2

## 2021-05-31 MED ORDER — LIDOCAINE 2% (20 MG/ML) 5 ML SYRINGE
INTRAMUSCULAR | Status: DC | PRN
  Administered 2021-05-31: 60 mg via INTRAVENOUS

## 2021-05-31 MED ORDER — FENTANYL CITRATE (PF) 250 MCG/5ML IJ SOLN
INTRAMUSCULAR | Status: DC | PRN
Start: 2021-05-31 — End: 2021-05-31
  Administered 2021-05-31 (×4): 25 ug via INTRAVENOUS

## 2021-05-31 MED ORDER — PANTOPRAZOLE SODIUM 40 MG PO TBEC
40.0000 mg | DELAYED_RELEASE_TABLET | Freq: Every day | ORAL | Status: DC
  Administered 2021-05-31: 40 mg via ORAL
  Filled 2021-05-31: qty 1

## 2021-05-31 MED ORDER — HEPARIN SODIUM (PORCINE) 1000 UNIT/ML IJ SOLN
INTRAMUSCULAR | Status: AC
  Filled 2021-05-31: qty 10

## 2021-05-31 MED ORDER — PROTAMINE SULFATE 10 MG/ML IV SOLN
INTRAVENOUS | Status: DC | PRN
Start: 2021-05-31 — End: 2021-05-31
  Administered 2021-05-31: 35 mg via INTRAVENOUS

## 2021-05-31 MED ORDER — SODIUM CHLORIDE 0.9 % IV SOLN: INTRAVENOUS | Status: DC

## 2021-05-31 MED ORDER — ISOPROTERENOL HCL 0.2 MG/ML IJ SOLN
INTRAMUSCULAR | Status: DC | PRN
  Administered 2021-05-31: 4 ug/min via INTRAVENOUS

## 2021-05-31 MED ORDER — COLCHICINE 0.6 MG PO TABS: 0.6000 mg | ORAL_TABLET | Freq: Two times a day (BID) | ORAL | 0 refills | Status: DC

## 2021-05-31 MED ORDER — ISOPROTERENOL HCL 0.2 MG/ML IJ SOLN
INTRAMUSCULAR | Status: AC
  Filled 2021-05-31: qty 5

## 2021-05-31 MED ORDER — HEPARIN (PORCINE) IN NACL 1000-0.9 UT/500ML-% IV SOLN
INTRAVENOUS | Status: DC | PRN
  Administered 2021-05-31 (×4): 500 mL

## 2021-05-31 MED ORDER — PROPOFOL 10 MG/ML IV BOLUS
INTRAVENOUS | Status: DC | PRN
  Administered 2021-05-31: 120 mg via INTRAVENOUS

## 2021-05-31 MED ORDER — SODIUM CHLORIDE 0.9% FLUSH: 3.0000 mL | INTRAVENOUS | Status: DC | PRN

## 2021-05-31 MED ORDER — ROCURONIUM BROMIDE 10 MG/ML (PF) SYRINGE
PREFILLED_SYRINGE | INTRAVENOUS | Status: DC | PRN
  Administered 2021-05-31: 70 mg via INTRAVENOUS

## 2021-05-31 MED ORDER — PANTOPRAZOLE SODIUM 40 MG PO TBEC: 40.0000 mg | DELAYED_RELEASE_TABLET | Freq: Every day | ORAL | 0 refills | Status: DC

## 2021-05-31 MED ORDER — ONDANSETRON HCL 4 MG/2ML IJ SOLN: 4.0000 mg | Freq: Four times a day (QID) | INTRAMUSCULAR | Status: DC | PRN

## 2021-05-31 MED ORDER — SODIUM CHLORIDE 0.9% FLUSH: 3.0000 mL | Freq: Two times a day (BID) | INTRAVENOUS | Status: DC

## 2021-05-31 MED ORDER — DEXAMETHASONE SODIUM PHOSPHATE 10 MG/ML IJ SOLN
INTRAMUSCULAR | Status: DC | PRN
  Administered 2021-05-31: 5 mg via INTRAVENOUS

## 2021-05-31 MED ORDER — HEPARIN SODIUM (PORCINE) 1000 UNIT/ML IJ SOLN
INTRAMUSCULAR | Status: DC | PRN
  Administered 2021-05-31: 1000 [IU] via INTRAVENOUS

## 2021-05-31 MED ORDER — SODIUM CHLORIDE 0.9 % IV SOLN: 250.0000 mL | INTRAVENOUS | Status: DC | PRN

## 2021-05-31 MED ORDER — ONDANSETRON HCL 4 MG/2ML IJ SOLN
INTRAMUSCULAR | Status: DC | PRN
  Administered 2021-05-31: 4 mg via INTRAVENOUS

## 2021-05-31 MED ORDER — SUGAMMADEX SODIUM 200 MG/2ML IV SOLN
INTRAVENOUS | Status: DC | PRN
Start: 2021-05-31 — End: 2021-05-31
  Administered 2021-05-31: 200 mg via INTRAVENOUS

## 2021-05-31 MED ORDER — PHENYLEPHRINE HCL-NACL 20-0.9 MG/250ML-% IV SOLN
INTRAVENOUS | Status: DC | PRN
Start: 2021-05-31 — End: 2021-05-31
  Administered 2021-05-31: 40 ug/min via INTRAVENOUS

## 2021-05-31 SURGICAL SUPPLY — 20 items
BAG SNAP BAND KOVER 36X36 (MISCELLANEOUS) ×1 IMPLANT
BLANKET WARM UNDERBOD FULL ACC (MISCELLANEOUS) ×3 IMPLANT
CATH 8FR REPROCESSED SOUNDSTAR (CATHETERS) ×2 IMPLANT
CATH 8FR SOUNDSTAR REPROCESSED (CATHETERS) IMPLANT
CATH OCTARAY 2.0 F 3-3-3-3-3 (CATHETERS) ×1 IMPLANT
CATH S CIRCA THERM PROBE 10F (CATHETERS) ×1 IMPLANT
CATH SMTCH THERMOCOOL SF DF (CATHETERS) ×1 IMPLANT
CATH WEB BI DIR CSDF CRV REPRO (CATHETERS) ×1 IMPLANT
CLOSURE PERCLOSE PROSTYLE (VASCULAR PRODUCTS) ×3 IMPLANT
COVER SWIFTLINK CONNECTOR (BAG) ×3 IMPLANT
PACK EP LATEX FREE (CUSTOM PROCEDURE TRAY) ×2
PACK EP LF (CUSTOM PROCEDURE TRAY) ×2 IMPLANT
PAD DEFIB RADIO PHYSIO CONN (PAD) ×3 IMPLANT
PATCH CARTO3 (PAD) ×1 IMPLANT
SHEATH BAYLIS TRANSSEPTAL 98CM (NEEDLE) ×1 IMPLANT
SHEATH CARTO VIZIGO SM CVD (SHEATH) ×1 IMPLANT
SHEATH PINNACLE 8F 10CM (SHEATH) ×2 IMPLANT
SHEATH PINNACLE 9F 10CM (SHEATH) ×1 IMPLANT
SHEATH PROBE COVER 6X72 (BAG) ×1 IMPLANT
TUBING SMART ABLATE COOLFLOW (TUBING) ×1 IMPLANT

## 2021-05-31 NOTE — Discharge Instructions (Signed)

## 2021-05-31 NOTE — Transfer of Care (Signed)
Immediate Anesthesia Transfer of Care Note  Patient: Penny Reed  Procedure(s) Performed: ATRIAL FIBRILLATION ABLATION  Patient Location: PACU  Anesthesia Type:General  Level of Consciousness: awake, alert  and oriented  Airway & Oxygen Therapy: Patient Spontanous Breathing and Patient connected to nasal cannula oxygen  Post-op Assessment: Report given to RN and Post -op Vital signs reviewed and stable  Post vital signs: Reviewed and stable  Last Vitals:  Vitals Value Taken Time  BP    Temp    Pulse 54 05/31/21 1039  Resp 14 05/31/21 1039  SpO2 96 % 05/31/21 1039  Vitals shown include unvalidated device data.  Last Pain:  Vitals:   05/31/21 0528  TempSrc: Oral         Complications: No notable events documented.

## 2021-05-31 NOTE — H&P (Signed)
Electrophysiology Office Follow up Visit Note:     Date:  05/31/2021    ID:  Penny Reed, DOB February 10, 1946, MRN [id number]   PCP:  Jerrol Banana., MD         [facility] Cardiologist:  None  [facility] Electrophysiologist:  Vickie Epley, MD      Interval History:     Penny Reed is a 76 y.o. female who presents for a follow up visit. She had an an EP study 09/04/2020 during which multiple atrial flutter circuits were observed alongside AF.    Dofetilide was discussed with the patient but she wished to continue with a watchful waiting approach.    I saw her back 03/17/2021. A cardioversion was scheduled for recurrent AFL but she again declined antiarrhythmic drug therapy.   Cardioversion was performed 04/02/2021.   On 04/14/2021 patient reported recurrent episodes of AF.    Today she reports daily symptomatic atrial fibrillation/flutter episodes. They last for a few hours at a time and are without clear trigger. She takes xarelto without bleeding issues.    Objective          Past Medical History:  Diagnosis Date   Allergic rhinitis     CAD (coronary artery disease)     Complication of anesthesia      woke up during colonoscopy   Hyperlipidemia     Hypertension     Hypothyroidism     LVH (left ventricular hypertrophy)             Past Surgical History:  Procedure Laterality Date   ABDOMINAL HYSTERECTOMY        has left ovary   APPENDECTOMY       CARDIAC CATHETERIZATION   09/2005    had 1 stent and 3 balloons placed   CARDIOVERSION N/A 04/02/2021    Procedure: CARDIOVERSION;  Surgeon: Freada Bergeron, MD;  Location: [facility];  Service: Cardiovascular;  Laterality: N/A;   COLONOSCOPY WITH PROPOFOL N/A 12/04/2019    Procedure: COLONOSCOPY WITH PROPOFOL;  Surgeon: Robert Bellow, MD;  Location: [facility];  Service: Endoscopy;  Laterality: N/A;   CORONARY ARTERY BYPASS GRAFT       SVT ABLATION N/A 09/04/2020    Procedure: SVT  ABLATION;  Surgeon: Vickie Epley, MD;  Location: [place] [facility];  Service: Cardiovascular;  Laterality: N/A;   TONSILLECTOMY          Current Medications: Active Medications      Current Meds  Medication Sig   albuterol (PROAIR HFA) 108 (90 BASE) MCG/ACT inhaler Inhale 2 puffs into the lungs every 6 (six) hours as needed for wheezing or shortness of breath.   amLODipine (NORVASC) 5 MG tablet TAKE 1 TABLET BY MOUTH EVERY DAY   atorvastatin (LIPITOR) 80 MG tablet TAKE 1 TABLET BY MOUTH EVERY DAY   Azelastine HCl 137 MCG/SPRAY SOLN SPRAY 2 SPRAYS INTO EACH NOSTRIL EVERY DAY (Patient taking differently: Place 2 sprays into both nostrils at bedtime.)   Calcium Carbonate (CALCIUM 600 PO) Take 1,200 mg by mouth in the morning and at bedtime.   cetirizine (ZYRTEC) 10 MG tablet Take 10 mg by mouth daily.   cholecalciferol (VITAMIN D3) 25 MCG (1000 UNIT) tablet Take 1,000 Units by mouth daily.   levothyroxine (SYNTHROID) 112 MCG tablet TAKE 1 TABLET BY MOUTH SIX DAYS PER WEEK. TAKE 1 AND 1/2 TABLET BY MOUTH ONE DAY PER WEEK.   Multiple Vitamin (MULTIVITAMIN) tablet Take 1 tablet  by mouth daily. Standard Process   nitroGLYCERIN (NITROSTAT) 0.4 MG SL tablet Place 0.4 mg under the tongue every 5 (five) minutes as needed for chest pain.    Omega-3 Fatty Acids (FISH OIL) 1000 MG CAPS Take 3,000 mg by mouth at bedtime.   Probiotic Product (ACIDOPHILUS PROBIOTIC BLEND PO) Take 1 capsule by mouth daily.   quinapril (ACCUPRIL) 40 MG tablet TAKE 1 TABLET BY MOUTH EVERY DAY   rivaroxaban (XARELTO) 20 MG TABS tablet TAKE 1 TABLET BY MOUTH DAILY WITH SUPPER. STOP ELIQUIS   umeclidinium-vilanterol (ANORO ELLIPTA) 62.5-25 MCG/ACT AEPB INHALE 1 PUFF BY MOUTH EVERY DAY   zolpidem (AMBIEN) 5 MG tablet TAKE 1 TABLET BY MOUTH AT BEDTIME AS NEEDED FOR SLEEP.        Allergies:   Adhesive [tape], Azithromycin, and Eliquis [apixaban]    Social History         Socioeconomic History   Marital status: Married       Spouse name: Not on file   Number of children: 3   Years of education: Not on file   Highest education level: Associate degree: occupational, Hotel manager, or vocational program  Occupational History   Not on file  Tobacco Use   Smoking status: Former      Packs/day: 0.25      Years: 25.00      Pack years: 6.25      Types: Cigarettes      Quit date: 04/25/2005      Years since quitting: 16.0   Smokeless tobacco: Never  Vaping Use   Vaping Use: Never used  Substance and Sexual Activity   Alcohol use: Yes      Alcohol/week: 5.0 standard drinks      Types: 5 Glasses of wine per week      Comment: spread out between 2-3 nights a week   Drug use: No   Sexual activity: Not on file  Other Topics Concern   Not on file  Social History Narrative   Not on file    Social Determinants of Health       Financial Resource Strain: Low Risk    Difficulty of Paying Living Expenses: Not hard at all  Food Insecurity: Not on file  Transportation Needs: Not on file  Physical Activity: Not on file  Stress: Not on file  Social Connections: Not on file      Family History: The patient's family history includes Asthma in her sister; Breast cancer in her maternal aunt; Breast cancer (age of onset: 3) in her sister; Cancer in her sister; Cancer - Colon in her paternal grandmother; Coronary artery disease in her father; Diabetes in her mother; Heart attack in her brother and father; Hyperlipidemia in her sister; Hypertension in her sister; Scleroderma in her sister; Stroke in her mother; Thyroid disease in her sister.   ROS:   Please see the history of present illness.    All other systems reviewed and are negative.   EKGs/Labs/Other Studies Reviewed:     The following studies were reviewed today:   Cardiac MRI     EKG:  The ekg ordered today demonstrates sinus bradycardia   Recent Labs: 03/23/2021: ALT 23; BUN 17; Creatinine, Ser 0.75; Hemoglobin 13.5; Platelets 203; Potassium 4.4;  Sodium 140; TSH 2.310  Recent Lipid Panel Labs (Brief)          Component Value Date/Time    CHOL 172 03/23/2021 0900    TRIG 69 03/23/2021 0900    HDL  71 03/23/2021 0900    CHOLHDL 2.4 03/23/2021 0900    LDLCALC 88 03/23/2021 0900        Physical Exam:     VS:  BP 128/60 (BP Location: Left Arm, Patient Position: Sitting, Cuff Size: Normal)    Pulse (!) 46    Ht 5\' 7"  (1.702 m)    Wt 183 lb (83 kg)    SpO2 97%    BMI 28.66 kg/m         Wt Readings from Last 3 Encounters:  04/28/21 183 lb (83 kg)  04/02/21 180 lb (81.6 kg)  03/23/21 180 lb (81.6 kg)      GEN:  Well nourished, well developed in no acute distress HEENT: Normal NECK: No JVD; No carotid bruits LYMPHATICS: No lymphadenopathy CARDIAC: RRR, no murmurs, rubs, gallops RESPIRATORY:  Clear to auscultation without rales, wheezing or rhonchi  ABDOMEN: Soft, non-tender, non-distended MUSCULOSKELETAL:  No edema; No deformity  SKIN: Warm and dry NEUROLOGIC:  Alert and oriented x 3 PSYCHIATRIC:  Normal affect            Assessment     ASSESSMENT:     1. Atypical atrial flutter ([place])   2. Coronary artery disease involving native coronary artery of native heart without angina pectoris   3. Hx of CABG   4. Persistent atrial fibrillation (HCC)     PLAN:     In order of problems listed above:   #Atrial flutter #Paroxysmal atrial fibrillation The patient reports a crescendo and symptomatic atrial arrhythmias.  Given her prior EP study results, these are likely both atrial fibrillation and flutter episodes.  We discussed the pathophysiology of her arrhythmia in detail during today's visit.  We discussed how she is between paroxysmal and persistent.  We discussed the available treatment options including using dofetilide for rhythm control versus catheter ablation for rhythm control.  We discussed the success rates for the 2 options.  We discussed the catheter ablation procedure in detail include the risk, recovery  and likelihood of success.  We discussed the need for PVI plus CTI plus ablation of any atypical flutter circuits.   The patient and her husband had many questions that were answered during today's appointment.  After our discussion, they would like some time to think through the options which I think is very reasonable.  They will let us know how they would like to proceed.  I will update her echocardiogram now.  If she elects to proceed with catheter ablation, will need a CT scan.   Risk, benefits, and alternatives to EP study and radiofrequency ablation for afib were also discussed in detail today. These risks include but are not limited to stroke, bleeding, vascular damage, tamponade, perforation, damage to the esophagus, lungs, and other structures, pulmonary vein stenosis, worsening renal function, and death. The patient understands these risks. Carto, ICE, anesthesia would be required for the procedure.  CT PV protocol also would be required prior to the procedure to exclude LAA thrombus and further evaluate atrial anatomy.  I truly believe either option is reasonable for her.  We discussed how the matter what choice she makes, we could always transition to the alternative down the road if she continues to have symptomatic arrhythmias.  #Coronary artery disease post bypass No ischemic symptoms.  There is scar on the cardiac MRI that corresponds to her coronary history.  Follow-up with me in 6 months or sooner should she decide to proceed with Tikosyn or catheter ablation.  Total time spent with patient today 45 minutes. This includes reviewing records, evaluating the patient and coordinating care.    Medication Adjustments/Labs and Tests Ordered: Current medicines are reviewed at length with the patient today.  Concerns regarding medicines are outlined above.     Orders Placed This Encounter  Procedures   EKG 12-Lead   ECHOCARDIOGRAM COMPLETE    No orders of the  defined types were placed in this encounter.       Signed, Lars Mage, MD, [facility], [facility] 04/28/2021 11:43 AM    Electrophysiology [place]   ------------------------------------  I have seen, examined the patient, and reviewed the above assessment and plan.    Plan for PVI + CTI today. Procedure reviewed.   Vickie Epley, MD 05/31/2021 7:21 AM

## 2021-05-31 NOTE — Anesthesia Postprocedure Evaluation (Signed)
Anesthesia Post Note  Patient: Penny Reed  Procedure(s) Performed: ATRIAL FIBRILLATION ABLATION     Patient location during evaluation: PACU Anesthesia Type: General Level of consciousness: awake and alert and oriented Pain management: pain level controlled Vital Signs Assessment: post-procedure vital signs reviewed and stable Respiratory status: spontaneous breathing, nonlabored ventilation and respiratory function stable Cardiovascular status: blood pressure returned to baseline and stable Postop Assessment: no apparent nausea or vomiting Anesthetic complications: no   No notable events documented.  Last Vitals:  Vitals:   05/31/21 1129 05/31/21 1133  BP: (!) 114/41 (!) 119/40  Pulse: (!) 55 (!) 54  Resp: (!) 9 10  Temp:    SpO2: 92% 91%    Last Pain:  Vitals:   05/31/21 1128  TempSrc:   PainSc: 3                  Mayank Teuscher A.

## 2021-05-31 NOTE — Anesthesia Procedure Notes (Signed)
Procedure Name: Intubation Date/Time: 05/31/2021 7:41 AM Performed by: Kyung Rudd, CRNA Pre-anesthesia Checklist: Patient identified, Emergency Drugs available, Suction available and Patient being monitored Patient Re-evaluated:Patient Re-evaluated prior to induction Oxygen Delivery Method: Circle system utilized Preoxygenation: Pre-oxygenation with 100% oxygen Induction Type: IV induction Ventilation: Mask ventilation without difficulty Laryngoscope Size: Mac and 3 Grade View: Grade III Tube type: Oral Tube size: 7.0 mm Number of attempts: 1 Airway Equipment and Method: Stylet Placement Confirmation: positive ETCO2 and breath sounds checked- equal and bilateral Secured at: 21 cm Tube secured with: Tape Dental Injury: Teeth and Oropharynx as per pre-operative assessment

## 2021-06-01 ENCOUNTER — Encounter (HOSPITAL_COMMUNITY): Payer: Self-pay | Admitting: Cardiology

## 2021-06-22 ENCOUNTER — Other Ambulatory Visit: Payer: Self-pay | Admitting: Family Medicine

## 2021-06-22 DIAGNOSIS — I10 Essential (primary) hypertension: Secondary | ICD-10-CM

## 2021-06-23 NOTE — Telephone Encounter (Signed)
Requested medication (s) are due for refill today:   Not sure ? ?Requested medication (s) are on the active medication list:   No  This medication is not available.   Alternatives are listed from pharmacy ? ?Future visit scheduled:   No ? ? ?Last ordered: Not on list. ? ?Returned because this is on backorder/unavailable.     ? ?Requested Prescriptions  ?Pending Prescriptions Disp Refills  ? benazepril (LOTENSIN) 40 MG tablet [Pharmacy Med Name: BENAZEPRIL HCL 40 MG TABLET]  0  ?  Sig: Please specify directions, refills and quantity  ?  ? Cardiovascular:  ACE Inhibitors Failed - 06/22/2021  4:00 PM  ?  ?  Failed - Last BP in normal range  ?  BP Readings from Last 1 Encounters:  ?05/31/21 (!) 146/45  ?  ?  ?  ?  Passed - Cr in normal range and within 180 days  ?  Creatinine, Ser  ?Date Value Ref Range Status  ?05/11/2021 0.76 0.57 - 1.00 mg/dL Final  ?  ?  ?  ?  Passed - K in normal range and within 180 days  ?  Potassium  ?Date Value Ref Range Status  ?05/11/2021 4.8 3.5 - 5.2 mmol/L Final  ?  ?  ?  ?  Passed - Patient is not pregnant  ?  ?  Passed - Valid encounter within last 6 months  ?  Recent Outpatient Visits   ? ?      ? 3 months ago Essential hypertension  ? [facility] Maple Hudson., MD  ? 9 months ago Annual physical exam  ? [facility] Maple Hudson., MD  ? 1 year ago Arteriosclerosis of coronary artery  ? [facility] Maple Hudson., MD  ? 1 year ago Perforation of right tympanic membrane  ? [facility] Chrismon, Jodell Cipro, PA-C  ? 1 year ago Annual physical exam  ? [facility] Maple Hudson., MD  ? ?  ?  ?Future Appointments   ? ?        ? In 2 months Lanier Prude, MD [facility], [facility]  ? ?  ? ?  ?  ?  ? ?

## 2021-06-28 ENCOUNTER — Other Ambulatory Visit: Payer: Self-pay

## 2021-06-28 ENCOUNTER — Ambulatory Visit (HOSPITAL_COMMUNITY)
Admission: RE | Admit: 2021-06-28 | Discharge: 2021-06-28 | Disposition: A | Discharge status: Home / Self Care | Payer: Medicare HMO | Source: Ambulatory Visit | Attending: Physician Assistant | Admitting: Physician Assistant

## 2021-06-28 ENCOUNTER — Encounter (HOSPITAL_COMMUNITY): Payer: Self-pay | Admitting: Physician Assistant

## 2021-06-28 VITALS — BP 132/60 | HR 56 | Ht 67.0 in | Wt 183.4 lb

## 2021-06-28 DIAGNOSIS — J449 Chronic obstructive pulmonary disease, unspecified: Secondary | ICD-10-CM | POA: Diagnosis not present

## 2021-06-28 DIAGNOSIS — R9431 Abnormal electrocardiogram [ECG] [EKG]: Secondary | ICD-10-CM | POA: Diagnosis not present

## 2021-06-28 DIAGNOSIS — R001 Bradycardia, unspecified: Secondary | ICD-10-CM | POA: Insufficient documentation

## 2021-06-28 DIAGNOSIS — Z7901 Long term (current) use of anticoagulants: Secondary | ICD-10-CM | POA: Diagnosis not present

## 2021-06-28 DIAGNOSIS — I4892 Unspecified atrial flutter: Secondary | ICD-10-CM | POA: Insufficient documentation

## 2021-06-28 DIAGNOSIS — Z951 Presence of aortocoronary bypass graft: Secondary | ICD-10-CM | POA: Insufficient documentation

## 2021-06-28 DIAGNOSIS — I4819 Other persistent atrial fibrillation: Secondary | ICD-10-CM | POA: Insufficient documentation

## 2021-06-28 DIAGNOSIS — D6869 Other thrombophilia: Secondary | ICD-10-CM

## 2021-06-28 DIAGNOSIS — I484 Atypical atrial flutter: Secondary | ICD-10-CM

## 2021-06-28 DIAGNOSIS — E785 Hyperlipidemia, unspecified: Secondary | ICD-10-CM | POA: Insufficient documentation

## 2021-06-28 DIAGNOSIS — I119 Hypertensive heart disease without heart failure: Secondary | ICD-10-CM | POA: Insufficient documentation

## 2021-06-28 DIAGNOSIS — I2510 Atherosclerotic heart disease of native coronary artery without angina pectoris: Secondary | ICD-10-CM | POA: Diagnosis not present

## 2021-06-28 DIAGNOSIS — E039 Hypothyroidism, unspecified: Secondary | ICD-10-CM | POA: Diagnosis not present

## 2021-06-28 NOTE — Progress Notes (Signed)
Primary Care Physician: Maple Hudson., MD Primary Cardiologist: Dr Juliann Pares Primary Electrophysiologist: Dr Lalla Brothers Referring Physician: Dr Tobe Sos is a 76 y.o. female with a history of CAD s/p CABG, COPD, HTN, hypothyroidism, HLD, atrial fibrillation who presents for follow up in the [facility]. She had an EP study on Sep 04, 2020.  During the EP study multiple different atypical atrial flutters were induced with aberrant ventricular conduction. Patient is on Xarelto for a CHADS2VASC score of 4. She underwent afib and flutter ablation with Dr Lalla Brothers on 05/31/21. Since the procedure, she has not had any symptoms of afib. She denies CP, swallowing pain, or groin issues.   Today, she denies symptoms of palpitations, chest pain, shortness of breath, orthopnea, PND, lower extremity edema, dizziness, presyncope, syncope, snoring, daytime somnolence, bleeding, or neurologic sequela. The patient is tolerating medications without difficulties and is otherwise without complaint today.    Atrial Fibrillation Risk Factors:  she does not have symptoms or diagnosis of sleep apnea. she does not have a history of rheumatic fever.   she has a BMI of Body mass index is 28.72 kg/m.[name] Filed Weights   06/28/21 1020  Weight: 83.2 kg    Family History  Problem Relation Age of Onset   Diabetes Mother    Stroke Mother        cause of death   Coronary artery disease Father    Heart attack Father    Hyperlipidemia Sister    Hypertension Sister    Breast cancer Sister 75   Cancer Sister        breast cancer   Asthma Sister    Scleroderma Sister    Thyroid disease Sister    Heart attack Brother    Cancer - Colon Paternal Grandmother    Breast cancer Maternal Aunt        40s     Atrial Fibrillation Management history:  Previous antiarrhythmic drugs: none Previous cardioversions: 04/02/22 Previous ablations: 05/31/21 CHADS2VASC score:  4 Anticoagulation history: Eliquis (had lip swelling), Xarelto    Past Medical History:  Diagnosis Date   Allergic rhinitis    CAD (coronary artery disease)    Complication of anesthesia    woke up during colonoscopy   Hyperlipidemia    Hypertension    Hypothyroidism    LVH (left ventricular hypertrophy)    Past Surgical History:  Procedure Laterality Date   ABDOMINAL HYSTERECTOMY     has left ovary   APPENDECTOMY     ATRIAL FIBRILLATION ABLATION N/A 05/31/2021   Procedure: ATRIAL FIBRILLATION ABLATION;  Surgeon: Lanier Prude, MD;  Location: [facility] INVASIVE CV LAB;  Service: Cardiovascular;  Laterality: N/A;   CARDIAC CATHETERIZATION  09/2005   had 1 stent and 3 balloons placed   CARDIOVERSION N/A 04/02/2021   Procedure: CARDIOVERSION;  Surgeon: Meriam Sprague, MD;  Location: [facility] ENDOSCOPY;  Service: Cardiovascular;  Laterality: N/A;   COLONOSCOPY WITH PROPOFOL N/A 12/04/2019   Procedure: COLONOSCOPY WITH PROPOFOL;  Surgeon: Earline Mayotte, MD;  Location: [facility] ENDOSCOPY;  Service: Endoscopy;  Laterality: N/A;   CORONARY ARTERY BYPASS GRAFT     SVT ABLATION N/A 09/04/2020   Procedure: SVT ABLATION;  Surgeon: Lanier Prude, MD;  Location: [facility] INVASIVE CV LAB;  Service: Cardiovascular;  Laterality: N/A;   TONSILLECTOMY      Current Outpatient Medications  Medication Sig Dispense Refill   albuterol (PROAIR HFA) 108 (90 BASE) MCG/ACT inhaler Inhale  2 puffs into the lungs every 6 (six) hours as needed for wheezing or shortness of breath. 3 Inhaler 3   amLODipine (NORVASC) 5 MG tablet TAKE 1 TABLET BY MOUTH EVERY DAY 90 tablet 4   atorvastatin (LIPITOR) 80 MG tablet TAKE 1 TABLET BY MOUTH EVERY DAY 90 tablet 3   Azelastine HCl 137 MCG/SPRAY SOLN SPRAY 2 SPRAYS INTO EACH NOSTRIL EVERY DAY (Patient taking differently: Place 1-2 sprays into both nostrils daily.) 30 mL 3   benazepril (LOTENSIN) 40 MG tablet Please specify directions, refills and quantity 90 tablet 0   Calcium  Carbonate (CALCIUM 600 PO) Take 1,200 mg by mouth in the morning and at bedtime.     cetirizine (ZYRTEC) 10 MG tablet Take 10 mg by mouth daily.     cholecalciferol (VITAMIN D3) 25 MCG (1000 UNIT) tablet Take 1,000 Units by mouth daily.     levothyroxine (SYNTHROID) 112 MCG tablet TAKE 1 TABLET BY MOUTH SIX DAYS PER WEEK. TAKE 1 AND 1/2 TABLET BY MOUTH ONE DAY PER WEEK. 100 tablet 2   Multiple Vitamin (MULTIVITAMIN) tablet Take 1 tablet by mouth daily. Standard Process     nitroGLYCERIN (NITROSTAT) 0.4 MG SL tablet Place 0.4 mg under the tongue every 5 (five) minutes as needed for chest pain.      Omega-3 Fatty Acids (FISH OIL) 1000 MG CAPS Take 3,000 mg by mouth at bedtime.     pantoprazole (PROTONIX) 40 MG tablet Take 1 tablet (40 mg total) by mouth daily. 45 tablet 0   Probiotic Product (ACIDOPHILUS PROBIOTIC BLEND PO) Take 1 capsule by mouth daily.     rivaroxaban (XARELTO) 20 MG TABS tablet TAKE 1 TABLET BY MOUTH DAILY WITH SUPPER. STOP ELIQUIS 90 tablet 1   umeclidinium-vilanterol (ANORO ELLIPTA) 62.5-25 MCG/ACT AEPB INHALE 1 PUFF BY MOUTH EVERY DAY 120 each 3   zolpidem (AMBIEN) 5 MG tablet TAKE 1 TABLET BY MOUTH AT BEDTIME AS NEEDED FOR SLEEP. 30 tablet 3   No current facility-administered medications for this encounter.    Allergies  Allergen Reactions   Adhesive [Tape]     Band-aids irritate skin   Azithromycin Diarrhea and Nausea And Vomiting   Eliquis [Apixaban] Swelling    Leg and ankle swelling    Social History   Socioeconomic History   Marital status: Married    Spouse name: Not on file   Number of children: 3   Years of education: Not on file   Highest education level: Associate degree: occupational, Hotel manager, or vocational program  Occupational History   Not on file  Tobacco Use   Smoking status: Former    Packs/day: 0.25    Years: 25.00    Pack years: 6.25    Types: Cigarettes    Quit date: 04/25/2005    Years since quitting: 16.1   Smokeless tobacco:  Never   Tobacco comments:    Former smoker 06/28/21  Vaping Use   Vaping Use: Never used  Substance and Sexual Activity   Alcohol use: Yes    Alcohol/week: 6.0 - 14.0 standard drinks    Types: 6 - 14 Glasses of wine per week    Comment: 2 glasses in a setting over 3-7 days a week 06/28/21   Drug use: No   Sexual activity: Not on file  Other Topics Concern   Not on file  Social History Narrative   Not on file   Social Determinants of Health   Financial Resource Strain: Low Risk  Difficulty of Paying Living Expenses: Not hard at all  Food Insecurity: Not on file  Transportation Needs: Not on file  Physical Activity: Not on file  Stress: Not on file  Social Connections: Not on file  Intimate Partner Violence: Not on file     ROS- All systems are reviewed and negative except as per the HPI above.  Physical Exam: Vitals:   06/28/21 1020  BP: 132/60  Pulse: (!) 56  Weight: 83.2 kg  Height: 5\' 7"  (1.702 m)    GEN- The patient is a well appearing female, alert and oriented x 3 today.   Head- normocephalic, atraumatic Eyes-  Sclera clear, conjunctiva pink Ears- hearing intact Oropharynx- clear Neck- supple  Lungs- Clear to ausculation bilaterally, normal work of breathing Heart- Regular rate and rhythm, no murmurs, rubs or gallops  GI- soft, NT, ND, + BS Extremities- no clubbing, cyanosis, or edema MS- no significant deformity or atrophy Skin- no rash or lesion Psych- euthymic mood, full affect Neuro- strength and sensation are intact  Wt Readings from Last 3 Encounters:  06/28/21 83.2 kg  04/28/21 83 kg  04/02/21 81.6 kg    EKG today demonstrates  SR, NST Vent. rate 56 BPM PR interval 188 ms QRS duration 94 ms QT/QTcB 444/428 ms  Echo 05/25/21 demonstrated   1. Left ventricular ejection fraction, by estimation, is 60 to 65%. The  left ventricle has normal function. Unable to exclude small region of  hypokinesis in the mid to basal septal wall (wall  thinning noted in this  region) There is mild to moderate asymmetric left ventricular hypertrophy of the basal-septal and septal segments. Left ventricular diastolic parameters are consistent with Grade  II diastolic dysfunction (pseudonormalization).   2. Right ventricular systolic function is normal. The right ventricular  size is normal. Mildly increased right ventricular wall thickness.   3. Left atrial size was severely dilated.   4. The mitral valve is normal in structure. Mild mitral valve  regurgitation. No evidence of mitral stenosis.   5. The aortic valve has an indeterminant number of cusps. Aortic valve  regurgitation is mild. No aortic stenosis is present.   6. There is mild dilatation of the ascending aorta, measuring 42 mm.   7. The inferior vena cava is normal in size with greater than 50%  respiratory variability, suggesting right atrial pressure of 3 mmHg.  Epic records are reviewed at length today  CHA2DS2-VASc Score = 4  The patient's score is based upon: CHF History: 0 HTN History: 1 Diabetes History: 0 Stroke History: 0 Vascular Disease History: 1 Age Score: 1 Gender Score: 1       ASSESSMENT AND PLAN: 1. Persistent Atrial Fibrillation/atrial flutter The patient's CHA2DS2-VASc score is 4, indicating a 4.8% annual risk of stroke.   S/p afib and flutter ablation 05/31/21 Patient appears to be maintaining SR. Continue Xarelto 20 mg daily with no missed doses for 3 months post ablation.   2. Secondary Hypercoagulable State (ICD10:  D68.69) The patient is at significant risk for stroke/thromboembolism based upon her CHA2DS2-VASc Score of 4.  Continue Rivaroxaban (Xarelto).   3. HTN Stable, no changes today.  4. CAD S/p CABG No anginal symptoms.   Follow up with Dr Quentin Ore as scheduled.    [facility] [address] [place], [place] [address] [phone] 06/28/2021 10:44 AM

## 2021-07-05 DIAGNOSIS — E039 Hypothyroidism, unspecified: Secondary | ICD-10-CM | POA: Diagnosis not present

## 2021-07-05 DIAGNOSIS — I110 Hypertensive heart disease with heart failure: Secondary | ICD-10-CM | POA: Diagnosis not present

## 2021-07-05 DIAGNOSIS — I252 Old myocardial infarction: Secondary | ICD-10-CM | POA: Diagnosis not present

## 2021-07-05 DIAGNOSIS — D6869 Other thrombophilia: Secondary | ICD-10-CM | POA: Diagnosis not present

## 2021-07-05 DIAGNOSIS — J309 Allergic rhinitis, unspecified: Secondary | ICD-10-CM | POA: Diagnosis not present

## 2021-07-05 DIAGNOSIS — I509 Heart failure, unspecified: Secondary | ICD-10-CM | POA: Diagnosis not present

## 2021-07-05 DIAGNOSIS — I25119 Atherosclerotic heart disease of native coronary artery with unspecified angina pectoris: Secondary | ICD-10-CM | POA: Diagnosis not present

## 2021-07-05 DIAGNOSIS — Z008 Encounter for other general examination: Secondary | ICD-10-CM | POA: Diagnosis not present

## 2021-07-05 DIAGNOSIS — I471 Supraventricular tachycardia: Secondary | ICD-10-CM | POA: Diagnosis not present

## 2021-07-05 DIAGNOSIS — I4891 Unspecified atrial fibrillation: Secondary | ICD-10-CM | POA: Diagnosis not present

## 2021-07-05 DIAGNOSIS — E785 Hyperlipidemia, unspecified: Secondary | ICD-10-CM | POA: Diagnosis not present

## 2021-07-05 DIAGNOSIS — J449 Chronic obstructive pulmonary disease, unspecified: Secondary | ICD-10-CM | POA: Diagnosis not present

## 2021-07-05 DIAGNOSIS — F419 Anxiety disorder, unspecified: Secondary | ICD-10-CM | POA: Diagnosis not present

## 2021-07-06 ENCOUNTER — Other Ambulatory Visit: Payer: Self-pay | Admitting: Family Medicine

## 2021-07-06 ENCOUNTER — Other Ambulatory Visit (HOSPITAL_COMMUNITY): Payer: Self-pay | Admitting: *Deleted

## 2021-07-06 DIAGNOSIS — J309 Allergic rhinitis, unspecified: Secondary | ICD-10-CM

## 2021-07-06 MED ORDER — RIVAROXABAN 20 MG PO TABS: ORAL_TABLET | ORAL | 1 refills | Status: DC

## 2021-07-06 NOTE — Telephone Encounter (Signed)
Requested medication (s) are due for refill today: Yes ? ?Requested medication (s) are on the active medication list: Yes ? ?Last refill:  11/17/20 ? ?Future visit scheduled: Yes ? ?Notes to clinic:  See request. ? ? ? ?Requested Prescriptions  ?Pending Prescriptions Disp Refills  ? zolpidem (AMBIEN) 5 MG tablet [Pharmacy Med Name: ZOLPIDEM TARTRATE 5 MG TABLET] 30 tablet   ?  Sig: TAKE 1 TABLET BY MOUTH EVERY DAY AT BEDTIME AS NEEDED FOR SLEEP  ?  ? Not Delegated - Psychiatry:  Anxiolytics/Hypnotics Failed - 07/06/2021  8:18 AM  ?  ?  Failed - This refill cannot be delegated  ?  ?  Failed - Urine Drug Screen completed in last 360 days  ?  ?  Passed - Valid encounter within last 6 months  ?  Recent Outpatient Visits   ? ?      ? 3 months ago Essential hypertension  ? [facility] Maple Hudson., MD  ? 9 months ago Annual physical exam  ? [facility] Maple Hudson., MD  ? 1 year ago Arteriosclerosis of coronary artery  ? [facility] Maple Hudson., MD  ? 1 year ago Perforation of right tympanic membrane  ? [facility] Chrismon, Jodell Cipro, PA-C  ? 1 year ago Annual physical exam  ? [facility] Maple Hudson., MD  ? ?  ?  ?Future Appointments   ? ?        ? In 1 month Lanier Prude, MD [facility], [facility]  ? ?  ? ?  ?  ?  ?Signed Prescriptions Disp Refills  ? Azelastine HCl 137 MCG/SPRAY SOLN 30 mL 3  ?  Sig: SPRAY 2 SPRAYS INTO EACH NOSTRIL EVERY DAY  ?  ? Ear, Nose, and Throat: Nasal Preparations - Antiallergy Passed - 07/06/2021  8:18 AM  ?  ?  Passed - Valid encounter within last 12 months  ?  Recent Outpatient Visits   ? ?      ? 3 months ago Essential hypertension  ? [facility] Maple Hudson., MD  ? 9 months ago Annual physical exam  ? [facility] Maple Hudson., MD  ? 1 year ago Arteriosclerosis of coronary artery  ? [facility] Maple Hudson., MD  ? 1 year ago Perforation of right tympanic membrane  ? [facility] Chrismon, Jodell Cipro, PA-C  ? 1 year ago Annual physical exam  ? [facility] Maple Hudson., MD  ? ?  ?  ?Future Appointments   ? ?        ? In 1 month Lanier Prude, MD [facility], [facility]  ? ?  ? ?  ?  ?  ? ?

## 2021-07-07 ENCOUNTER — Encounter (HOSPITAL_COMMUNITY): Payer: Self-pay

## 2021-07-07 ENCOUNTER — Other Ambulatory Visit: Payer: Self-pay | Admitting: Student

## 2021-07-09 ENCOUNTER — Other Ambulatory Visit: Payer: Self-pay

## 2021-07-09 ENCOUNTER — Encounter (HOSPITAL_COMMUNITY): Payer: Self-pay | Admitting: Physician Assistant

## 2021-07-09 ENCOUNTER — Ambulatory Visit (HOSPITAL_COMMUNITY)
Admission: RE | Admit: 2021-07-09 | Discharge: 2021-07-09 | Disposition: A | Discharge status: Home / Self Care | Payer: Medicare HMO | Source: Ambulatory Visit | Attending: Physician Assistant | Admitting: Physician Assistant

## 2021-07-09 VITALS — BP 124/74 | HR 100 | Ht 67.0 in | Wt 182.4 lb

## 2021-07-09 DIAGNOSIS — Z951 Presence of aortocoronary bypass graft: Secondary | ICD-10-CM | POA: Diagnosis not present

## 2021-07-09 DIAGNOSIS — E785 Hyperlipidemia, unspecified: Secondary | ICD-10-CM | POA: Insufficient documentation

## 2021-07-09 DIAGNOSIS — I2510 Atherosclerotic heart disease of native coronary artery without angina pectoris: Secondary | ICD-10-CM | POA: Diagnosis not present

## 2021-07-09 DIAGNOSIS — D6869 Other thrombophilia: Secondary | ICD-10-CM | POA: Diagnosis not present

## 2021-07-09 DIAGNOSIS — J449 Chronic obstructive pulmonary disease, unspecified: Secondary | ICD-10-CM | POA: Insufficient documentation

## 2021-07-09 DIAGNOSIS — I4819 Other persistent atrial fibrillation: Secondary | ICD-10-CM | POA: Diagnosis not present

## 2021-07-09 DIAGNOSIS — Z7901 Long term (current) use of anticoagulants: Secondary | ICD-10-CM | POA: Diagnosis not present

## 2021-07-09 DIAGNOSIS — I4892 Unspecified atrial flutter: Secondary | ICD-10-CM | POA: Diagnosis not present

## 2021-07-09 DIAGNOSIS — I10 Essential (primary) hypertension: Secondary | ICD-10-CM | POA: Insufficient documentation

## 2021-07-09 DIAGNOSIS — E039 Hypothyroidism, unspecified: Secondary | ICD-10-CM | POA: Diagnosis not present

## 2021-07-09 MED ORDER — QUINAPRIL HCL 40 MG PO TABS: 40.0000 mg | ORAL_TABLET | Freq: Every day | ORAL | 11 refills | Status: DC

## 2021-07-09 MED ORDER — METOPROLOL SUCCINATE ER 25 MG PO TB24: 25.0000 mg | ORAL_TABLET | Freq: Every day | ORAL | 2 refills | Status: DC

## 2021-07-09 NOTE — Patient Instructions (Signed)
Start metoprolol 25mg  once a day at bedtime --- stop it once back in rhythm or on 4/4 ? ?Cardioversion scheduled for Wednesday, April 5th ? - come to afib clinic for labs at 9am ? - Arrive at the 02-15-1988 and go to admitting at 930am ? - Do not eat or drink anything after midnight the night prior to your procedure. ? - Take all your morning medication (except diabetic medications) with a sip of water prior to arrival. ? - You will not be able to drive home after your procedure. ? - Do NOT miss any doses of your blood thinner - if you should miss a dose please notify our office immediately. ? - If you feel as if you go back into normal rhythm prior to scheduled cardioversion, please notify our office immediately. If your procedure is canceled in the cardioversion suite you will be charged a cancellation fee. ? ?

## 2021-07-09 NOTE — H&P (View-Only) (Signed)
? ? ?Primary Care Physician: Jerrol Banana., MD ?Primary Cardiologist: Dr Clayborn Bigness ?Primary Electrophysiologist: Dr Quentin Ore ?Referring Physician: Dr Quentin Ore ? ? ?Penny Reed is a 76 y.o. female with a history of CAD s/p CABG, COPD, HTN, hypothyroidism, HLD, atrial fibrillation who presents for follow up in the [facility]. She had an EP study on Sep 04, 2020.  During the EP study multiple different atypical atrial flutters were induced with aberrant ventricular conduction. Patient is on Xarelto for a CHADS2VASC score of 4. She underwent afib and flutter ablation with Dr Quentin Ore on 05/31/21.  ? ?On follow up today, patient reports that she woke on 07/06/21 with fatigue, lightheadedness, and elevated heart rates. She checked her Jodelle Red mobile which showed afib. There were no specific triggers that she could identify. No bleeding issues on anticoagulation.  ? ?Today, she denies symptoms of chest pain, orthopnea, PND, lower extremity edema, presyncope, syncope, snoring, daytime somnolence, bleeding, or neurologic sequela. The patient is tolerating medications without difficulties and is otherwise without complaint today.  ? ? ?Atrial Fibrillation Risk Factors: ? ?she does not have symptoms or diagnosis of sleep apnea. ?she does not have a history of rheumatic fever. ? ? ?she has a BMI of Body mass index is 28.57 kg/m?Marland KitchenMarland Kitchen ?Filed Weights  ? 07/09/21 1336  ?Weight: 82.7 kg  ? ? ? ?Family History  ?Problem Relation Age of Onset  ? Diabetes Mother   ? Stroke Mother   ?     cause of death  ? Coronary artery disease Father   ? Heart attack Father   ? Hyperlipidemia Sister   ? Hypertension Sister   ? Breast cancer Sister 78  ? Cancer Sister   ?     breast cancer  ? Asthma Sister   ? Scleroderma Sister   ? Thyroid disease Sister   ? Heart attack Brother   ? Cancer - Colon Paternal Grandmother   ? Breast cancer Maternal Aunt   ?     14s  ? ? ? ?Atrial Fibrillation Management  history: ? ?Previous antiarrhythmic drugs: none ?Previous cardioversions: 04/02/22 ?Previous ablations: 05/31/21 ?CHADS2VASC score: 4 ?Anticoagulation history: Eliquis (had lip swelling), Xarelto  ? ? ?Past Medical History:  ?Diagnosis Date  ? Allergic rhinitis   ? CAD (coronary artery disease)   ? Complication of anesthesia   ? woke up during colonoscopy  ? Hyperlipidemia   ? Hypertension   ? Hypothyroidism   ? LVH (left ventricular hypertrophy)   ? ?Past Surgical History:  ?Procedure Laterality Date  ? ABDOMINAL HYSTERECTOMY    ? has left ovary  ? APPENDECTOMY    ? ATRIAL FIBRILLATION ABLATION N/A 05/31/2021  ? Procedure: ATRIAL FIBRILLATION ABLATION;  Surgeon: Vickie Epley, MD;  Location: [place] CV LAB;  Service: Cardiovascular;  Laterality: N/A;  ? CARDIAC CATHETERIZATION  09/2005  ? had 1 stent and 3 balloons placed  ? CARDIOVERSION N/A 04/02/2021  ? Procedure: CARDIOVERSION;  Surgeon: Freada Bergeron, MD;  Location: [facility] ENDOSCOPY;  Service: Cardiovascular;  Laterality: N/A;  ? COLONOSCOPY WITH PROPOFOL N/A 12/04/2019  ? Procedure: COLONOSCOPY WITH PROPOFOL;  Surgeon: Robert Bellow, MD;  Location: [facility] ENDOSCOPY;  Service: Endoscopy;  Laterality: N/A;  ? CORONARY ARTERY BYPASS GRAFT    ? SVT ABLATION N/A 09/04/2020  ? Procedure: SVT ABLATION;  Surgeon: Vickie Epley, MD;  Location: [place] CV LAB;  Service: Cardiovascular;  Laterality: N/A;  ? TONSILLECTOMY    ? ? ?  Current Outpatient Medications  ?Medication Sig Dispense Refill  ? albuterol (PROAIR HFA) 108 (90 BASE) MCG/ACT inhaler Inhale 2 puffs into the lungs every 6 (six) hours as needed for wheezing or shortness of breath. 3 Inhaler 3  ? amLODipine (NORVASC) 5 MG tablet TAKE 1 TABLET BY MOUTH EVERY DAY 90 tablet 4  ? atorvastatin (LIPITOR) 80 MG tablet TAKE 1 TABLET BY MOUTH EVERY DAY 90 tablet 3  ? Azelastine HCl 137 MCG/SPRAY SOLN SPRAY 2 SPRAYS INTO EACH NOSTRIL EVERY DAY 30 mL 3  ? Calcium Carbonate (CALCIUM 600 PO) Take 1,200 mg  by mouth in the morning and at bedtime.    ? cetirizine (ZYRTEC) 10 MG tablet Take 10 mg by mouth daily.    ? cholecalciferol (VITAMIN D3) 25 MCG (1000 UNIT) tablet Take 1,000 Units by mouth daily.    ? levothyroxine (SYNTHROID) 112 MCG tablet TAKE 1 TABLET BY MOUTH SIX DAYS PER WEEK. TAKE 1 AND 1/2 TABLET BY MOUTH ONE DAY PER WEEK. 100 tablet 2  ? Multiple Vitamin (MULTIVITAMIN) tablet Take 1 tablet by mouth daily. Standard Process    ? nitroGLYCERIN (NITROSTAT) 0.4 MG SL tablet Place 0.4 mg under the tongue every 5 (five) minutes as needed for chest pain.     ? Omega-3 Fatty Acids (FISH OIL) 1000 MG CAPS Take 3,000 mg by mouth at bedtime.    ? pantoprazole (PROTONIX) 40 MG tablet Take 1 tablet (40 mg total) by mouth daily. 45 tablet 0  ? Probiotic Product (ACIDOPHILUS PROBIOTIC BLEND PO) Take 1 capsule by mouth daily.    ? rivaroxaban (XARELTO) 20 MG TABS tablet TAKE 1 TABLET BY MOUTH DAILY WITH SUPPER. STOP ELIQUIS 90 tablet 1  ? umeclidinium-vilanterol (ANORO ELLIPTA) 62.5-25 MCG/ACT AEPB INHALE 1 PUFF BY MOUTH EVERY DAY 120 each 3  ? zolpidem (AMBIEN) 5 MG tablet TAKE 1 TABLET BY MOUTH EVERY DAY AT BEDTIME AS NEEDED FOR SLEEP 30 tablet 3  ? ?No current facility-administered medications for this encounter.  ? ? ?Allergies  ?Allergen Reactions  ? Adhesive [Tape]   ?  Band-aids irritate skin  ? Azithromycin Diarrhea and Nausea And Vomiting  ? Eliquis [Apixaban] Swelling  ?  Leg and ankle swelling  ? ? ?Social History  ? ?Socioeconomic History  ? Marital status: Married  ?  Spouse name: Not on file  ? Number of children: 3  ? Years of education: Not on file  ? Highest education level: Associate degree: occupational, Hotel manager, or vocational program  ?Occupational History  ? Not on file  ?Tobacco Use  ? Smoking status: Former  ?  Packs/day: 0.25  ?  Years: 25.00  ?  Pack years: 6.25  ?  Types: Cigarettes  ?  Quit date: 04/25/2005  ?  Years since quitting: 16.2  ? Smokeless tobacco: Never  ? Tobacco comments:  ?   Former smoker 06/28/21  ?Vaping Use  ? Vaping Use: Never used  ?Substance and Sexual Activity  ? Alcohol use: Yes  ?  Alcohol/week: 6.0 - 14.0 standard drinks  ?  Types: 6 - 14 Glasses of wine per week  ?  Comment: 2 glasses in a setting over 3-7 days a week 06/28/21  ? Drug use: No  ? Sexual activity: Not on file  ?Other Topics Concern  ? Not on file  ?Social History Narrative  ? Not on file  ? ?Social Determinants of Health  ? ?Financial Resource Strain: Low Risk   ? Difficulty of Paying Living Expenses:  Not hard at all  ?Food Insecurity: Not on file  ?Transportation Needs: Not on file  ?Physical Activity: Not on file  ?Stress: Not on file  ?Social Connections: Not on file  ?Intimate Partner Violence: Not on file  ? ? ? ?ROS- All systems are reviewed and negative except as per the HPI above. ? ?Physical Exam: ?Vitals:  ? 07/09/21 1336  ?BP: 124/74  ?Pulse: 100  ?Weight: 82.7 kg  ?Height: 5\' 7"  (1.702 m)  ? ? ?GEN- The patient is a well appearing female, alert and oriented x 3 today.   ?HEENT-head normocephalic, atraumatic, sclera clear, conjunctiva pink, hearing intact, trachea midline. ?Lungs- Clear to ausculation bilaterally, normal work of breathing ?Heart- irregular rate and rhythm, no murmurs, rubs or gallops  ?GI- soft, NT, ND, + BS ?Extremities- no clubbing, cyanosis, or edema ?MS- no significant deformity or atrophy ?Skin- no rash or lesion ?Psych- euthymic mood, full affect ?Neuro- strength and sensation are intact ? ? ?Wt Readings from Last 3 Encounters:  ?07/09/21 82.7 kg  ?06/28/21 83.2 kg  ?04/28/21 83 kg  ? ? ?EKG today demonstrates  ?Afib, LVH ?Vent. rate 106 BPM ?PR interval * ms ?QRS duration 94 ms ?QT/QTcB 332/441 ms ? ?Echo 05/25/21 demonstrated  ? 1. Left ventricular ejection fraction, by estimation, is 60 to 65%. The  ?left ventricle has normal function. Unable to exclude small region of  ?hypokinesis in the mid to basal septal wall (wall thinning noted in this  ?region) There is mild to  moderate asymmetric left ventricular hypertrophy of the basal-septal and septal segments. Left ventricular diastolic parameters are consistent with Grade II diastolic dysfunction (pseudonormalization).  ? 2. Right ventricular syst

## 2021-07-09 NOTE — Progress Notes (Signed)
? ? ?Primary Care Physician: Gilbert, Richard L Jr., MD ?Primary Cardiologist: Dr Callwood ?Primary Electrophysiologist: Dr Lambert ?Referring Physician: Dr Lambert ? ? ?Penny Reed is a 76 y.o. female with a history of CAD s/p CABG, COPD, HTN, hypothyroidism, HLD, atrial fibrillation who presents for follow up in the [facility]. She had an EP study on Sep 04, 2020.  During the EP study multiple different atypical atrial flutters were induced with aberrant ventricular conduction. Patient is on Xarelto for a CHADS2VASC score of 4. She underwent afib and flutter ablation with Dr Lambert on 05/31/21.  ? ?On follow up today, patient reports that she woke on 07/06/21 with fatigue, lightheadedness, and elevated heart rates. She checked her Kardia mobile which showed afib. There were no specific triggers that she could identify. No bleeding issues on anticoagulation.  ? ?Today, she denies symptoms of chest pain, orthopnea, PND, lower extremity edema, presyncope, syncope, snoring, daytime somnolence, bleeding, or neurologic sequela. The patient is tolerating medications without difficulties and is otherwise without complaint today.  ? ? ?Atrial Fibrillation Risk Factors: ? ?she does not have symptoms or diagnosis of sleep apnea. ?she does not have a history of rheumatic fever. ? ? ?she has a BMI of Body mass index is 28.57 kg/m?.. ?Filed Weights  ? 07/09/21 1336  ?Weight: 82.7 kg  ? ? ? ?Family History  ?Problem Relation Age of Onset  ? Diabetes Mother   ? Stroke Mother   ?     cause of death  ? Coronary artery disease Father   ? Heart attack Father   ? Hyperlipidemia Sister   ? Hypertension Sister   ? Breast cancer Sister 63  ? Cancer Sister   ?     breast cancer  ? Asthma Sister   ? Scleroderma Sister   ? Thyroid disease Sister   ? Heart attack Brother   ? Cancer - Colon Paternal Grandmother   ? Breast cancer Maternal Aunt   ?     40s  ? ? ? ?Atrial Fibrillation Management  history: ? ?Previous antiarrhythmic drugs: none ?Previous cardioversions: 04/02/22 ?Previous ablations: 05/31/21 ?CHADS2VASC score: 4 ?Anticoagulation history: Eliquis (had lip swelling), Xarelto  ? ? ?Past Medical History:  ?Diagnosis Date  ? Allergic rhinitis   ? CAD (coronary artery disease)   ? Complication of anesthesia   ? woke up during colonoscopy  ? Hyperlipidemia   ? Hypertension   ? Hypothyroidism   ? LVH (left ventricular hypertrophy)   ? ?Past Surgical History:  ?Procedure Laterality Date  ? ABDOMINAL HYSTERECTOMY    ? has left ovary  ? APPENDECTOMY    ? ATRIAL FIBRILLATION ABLATION N/A 05/31/2021  ? Procedure: ATRIAL FIBRILLATION ABLATION;  Surgeon: Lambert, Cameron T, MD;  Location: [facility];  Service: Cardiovascular;  Laterality: N/A;  ? CARDIAC CATHETERIZATION  09/2005  ? had 1 stent and 3 balloons placed  ? CARDIOVERSION N/A 04/02/2021  ? Procedure: CARDIOVERSION;  Surgeon: Pemberton, Heather E, MD;  Location: [facility];  Service: Cardiovascular;  Laterality: N/A;  ? COLONOSCOPY WITH PROPOFOL N/A 12/04/2019  ? Procedure: COLONOSCOPY WITH PROPOFOL;  Surgeon: Byrnett, Jeffrey W, MD;  Location: [facility] END[facility]COPY;  Service: Endoscopy;  Laterality: N/A;  ? CORONARY ARTERY BYPASS GRAFT    ? SVT ABLATION N/A 09/04/2020  ? Procedure: SVT ABLATION;  Surgeon: Lambert, Cameron T, MD;  Location: [facility];  Service: Cardiovascular;  Laterality: N/A;  ? TONSILLECTOMY    ? ? ?  Current Outpatient Medications  ?Medication Sig Dispense Refill  ? albuterol (PROAIR HFA) 108 (90 BASE) MCG/ACT inhaler Inhale 2 puffs into the lungs every 6 (six) hours as needed for wheezing or shortness of breath. 3 Inhaler 3  ? amLODipine (NORVASC) 5 MG tablet TAKE 1 TABLET BY MOUTH EVERY DAY 90 tablet 4  ? atorvastatin (LIPITOR) 80 MG tablet TAKE 1 TABLET BY MOUTH EVERY DAY 90 tablet 3  ? Azelastine HCl 137 MCG/SPRAY SOLN SPRAY 2 SPRAYS INTO EACH NOSTRIL EVERY DAY 30 mL 3  ? Calcium Carbonate (CALCIUM 600 PO) Take 1,200 mg  by mouth in the morning and at bedtime.    ? cetirizine (ZYRTEC) 10 MG tablet Take 10 mg by mouth daily.    ? cholecalciferol (VITAMIN D3) 25 MCG (1000 UNIT) tablet Take 1,000 Units by mouth daily.    ? levothyroxine (SYNTHROID) 112 MCG tablet TAKE 1 TABLET BY MOUTH SIX DAYS PER WEEK. TAKE 1 AND 1/2 TABLET BY MOUTH ONE DAY PER WEEK. 100 tablet 2  ? Multiple Vitamin (MULTIVITAMIN) tablet Take 1 tablet by mouth daily. Standard Process    ? nitroGLYCERIN (NITROSTAT) 0.4 MG SL tablet Place 0.4 mg under the tongue every 5 (five) minutes as needed for chest pain.     ? Omega-3 Fatty Acids (FISH OIL) 1000 MG CAPS Take 3,000 mg by mouth at bedtime.    ? pantoprazole (PROTONIX) 40 MG tablet Take 1 tablet (40 mg total) by mouth daily. 45 tablet 0  ? Probiotic Product (ACIDOPHILUS PROBIOTIC BLEND PO) Take 1 capsule by mouth daily.    ? rivaroxaban (XARELTO) 20 MG TABS tablet TAKE 1 TABLET BY MOUTH DAILY WITH SUPPER. STOP ELIQUIS 90 tablet 1  ? umeclidinium-vilanterol (ANORO ELLIPTA) 62.5-25 MCG/ACT AEPB INHALE 1 PUFF BY MOUTH EVERY DAY 120 each 3  ? zolpidem (AMBIEN) 5 MG tablet TAKE 1 TABLET BY MOUTH EVERY DAY AT BEDTIME AS NEEDED FOR SLEEP 30 tablet 3  ? ?No current facility-administered medications for this encounter.  ? ? ?Allergies  ?Allergen Reactions  ? Adhesive [Tape]   ?  Band-aids irritate skin  ? Azithromycin Diarrhea and Nausea And Vomiting  ? Eliquis [Apixaban] Swelling  ?  Leg and ankle swelling  ? ? ?Social History  ? ?Socioeconomic History  ? Marital status: Married  ?  Spouse name: Not on file  ? Number of children: 3  ? Years of education: Not on file  ? Highest education level: Associate degree: occupational, technical, or vocational program  ?Occupational History  ? Not on file  ?Tobacco Use  ? Smoking status: Former  ?  Packs/day: 0.25  ?  Years: 25.00  ?  Pack years: 6.25  ?  Types: Cigarettes  ?  Quit date: 04/25/2005  ?  Years since quitting: 16.2  ? Smokeless tobacco: Never  ? Tobacco comments:  ?   Former smoker 06/28/21  ?Vaping Use  ? Vaping Use: Never used  ?Substance and Sexual Activity  ? Alcohol use: Yes  ?  Alcohol/week: 6.0 - 14.0 standard drinks  ?  Types: 6 - 14 Glasses of wine per week  ?  Comment: 2 glasses in a setting over 3-7 days a week 06/28/21  ? Drug use: No  ? Sexual activity: Not on file  ?Other Topics Concern  ? Not on file  ?Social History Narrative  ? Not on file  ? ?Social Determinants of Health  ? ?Financial Resource Strain: Low Risk   ? Difficulty of Paying Living Expenses:   Not hard at all  ?Food Insecurity: Not on file  ?Transportation Needs: Not on file  ?Physical Activity: Not on file  ?Stress: Not on file  ?Social Connections: Not on file  ?Intimate Partner Violence: Not on file  ? ? ? ?ROS- All systems are reviewed and negative except as per the HPI above. ? ?Physical Exam: ?Vitals:  ? 07/09/21 1336  ?BP: 124/74  ?Pulse: 100  ?Weight: 82.7 kg  ?Height: 5' 7" (1.702 m)  ? ? ?GEN- The patient is a well appearing female, alert and oriented x 3 today.   ?HEENT-head normocephalic, atraumatic, sclera clear, conjunctiva pink, hearing intact, trachea midline. ?Lungs- Clear to ausculation bilaterally, normal work of breathing ?Heart- irregular rate and rhythm, no murmurs, rubs or gallops  ?GI- soft, NT, ND, + BS ?Extremities- no clubbing, cyanosis, or edema ?MS- no significant deformity or atrophy ?Skin- no rash or lesion ?Psych- euthymic mood, full affect ?Neuro- strength and sensation are intact ? ? ?Wt Readings from Last 3 Encounters:  ?07/09/21 82.7 kg  ?06/28/21 83.2 kg  ?04/28/21 83 kg  ? ? ?EKG today demonstrates  ?Afib, LVH ?Vent. rate 106 BPM ?PR interval * ms ?QRS duration 94 ms ?QT/QTcB 332/441 ms ? ?Echo 05/25/21 demonstrated  ? 1. Left ventricular ejection fraction, by estimation, is 60 to 65%. The  ?left ventricle has normal function. Unable to exclude small region of  ?hypokinesis in the mid to basal septal wall (wall thinning noted in this  ?region) There is mild to  moderate asymmetric left ventricular hypertrophy of the basal-septal and septal segments. Left ventricular diastolic parameters are consistent with Grade II diastolic dysfunction (pseudonormalization).  ? 2. Right ventricular syst

## 2021-07-12 ENCOUNTER — Telehealth: Payer: Self-pay

## 2021-07-12 NOTE — Progress Notes (Signed)
? ? ?Chronic Care Management ?Pharmacy Assistant  ? ?Name: Penny Reed  MRN: [id number] DOB: [birth year] ? ?Reason for Encounter: Hypertension Disease State Call ? ?Recent office visits:  ?None ID ? ?Recent consult visits:  ?07/09/2021 Malka So, MD ([facility]) for Follow-up- Started: Metoprolol Succinate 25 mg daily until she is back in rhythm or 07/27/2021, Quinapril HCl 40 mg daily, EKG 12-lad order placed,  ? ?06/28/2021 Malka So, MD ([facility]) for Follow-up- No medication changes noted, EKG 12-Lead order placed, patient to follow-up as scheduled with Dr. Quentin Ore ? ?Hospital visits:  ?Medication Reconciliation was completed by comparing discharge summary, patient?s EMR and Pharmacy list, and upon discussion with patient. ? ?Admitted to the hospital on 05/31/2021 due to AFIB. Discharge date was 05/31/2021. Discharged from [facility].   ? ?New?Medications Started at [facility] Discharge:?? ?-START taking: ?Colchicine 0.6 MG tablet Take 1 tablet (0.6 mg total) by mouth 2 (two) times daily for  5 days. ?Notes to patient: Next dose tonight about 9pm ?pantoprazole (PROTONIX) 40 MG tablet Take 1 tablet (40 mg total) by mouth daily. ?Notes to patient: Next dose 2/7 ? ?Medication Changes at Hospital Discharge: ?-Changed None ID ? ?Medications Discontinued at Hospital Discharge: ?-Stopped None ID  ? ?Medications that remain the same after Hospital Discharge:??  ?-All other medications will remain the same.   ? ?Medications: ?Outpatient Encounter Medications as of 07/12/2021  ?Medication Sig  ? albuterol (PROAIR HFA) 108 (90 BASE) MCG/ACT inhaler Inhale 2 puffs into the lungs every 6 (six) hours as needed for wheezing or shortness of breath.  ? amLODipine (NORVASC) 5 MG tablet TAKE 1 TABLET BY MOUTH EVERY DAY  ? atorvastatin (LIPITOR) 80 MG tablet TAKE 1 TABLET BY MOUTH EVERY DAY  ? Azelastine HCl 137 MCG/SPRAY SOLN SPRAY 2 SPRAYS INTO EACH NOSTRIL EVERY DAY  ? Calcium Carbonate (CALCIUM 600  PO) Take 1,200 mg by mouth in the morning and at bedtime.  ? cetirizine (ZYRTEC) 10 MG tablet Take 10 mg by mouth daily.  ? cholecalciferol (VITAMIN D3) 25 MCG (1000 UNIT) tablet Take 1,000 Units by mouth daily.  ? levothyroxine (SYNTHROID) 112 MCG tablet TAKE 1 TABLET BY MOUTH SIX DAYS PER WEEK. TAKE 1 AND 1/2 TABLET BY MOUTH ONE DAY PER WEEK.  ? metoprolol succinate (TOPROL XL) 25 MG 24 hr tablet Take 1 tablet (25 mg total) by mouth daily.  ? Multiple Vitamin (MULTIVITAMIN) tablet Take 1 tablet by mouth daily. Standard Process  ? nitroGLYCERIN (NITROSTAT) 0.4 MG SL tablet Place 0.4 mg under the tongue every 5 (five) minutes as needed for chest pain.   ? Omega-3 Fatty Acids (FISH OIL) 1000 MG CAPS Take 3,000 mg by mouth at bedtime.  ? pantoprazole (PROTONIX) 40 MG tablet Take 1 tablet (40 mg total) by mouth daily.  ? Probiotic Product (ACIDOPHILUS PROBIOTIC BLEND PO) Take 1 capsule by mouth daily.  ? quinapril (ACCUPRIL) 40 MG tablet Take 1 tablet (40 mg total) by mouth daily.  ? rivaroxaban (XARELTO) 20 MG TABS tablet TAKE 1 TABLET BY MOUTH DAILY WITH SUPPER. STOP ELIQUIS  ? umeclidinium-vilanterol (ANORO ELLIPTA) 62.5-25 MCG/ACT AEPB INHALE 1 PUFF BY MOUTH EVERY DAY  ? zolpidem (AMBIEN) 5 MG tablet TAKE 1 TABLET BY MOUTH EVERY DAY AT BEDTIME AS NEEDED FOR SLEEP  ? ?No facility-administered encounter medications on file as of 07/12/2021.  ? ?Care Gaps: ?Zoster Vaccine ? ?Star Rating Drugs: ?Atorvastatin 80 mg last filled on 06/10/2021 for a 90-Days supply with [facility] ?Quinapril  40 mg last filled on 03/23/2021 for a 90-Day supply with [facility] ? ?Reviewed chart prior to disease state call. Spoke with patient regarding BP ? ?Recent Office Vitals: ?BP Readings from Last 3 Encounters:  ?07/09/21 124/74  ?06/28/21 132/60  ?05/31/21 (!) 146/45  ? ?Pulse Readings from Last 3 Encounters:  ?07/09/21 100  ?06/28/21 (!) 56  ?05/31/21 67  ?  ?Wt Readings from Last 3 Encounters:  ?07/09/21 182 lb 6.4 oz (82.7 kg)   ?06/28/21 183 lb 6.4 oz (83.2 kg)  ?04/28/21 183 lb (83 kg)  ?  ?Kidney Function ?Lab Results  ?Component Value Date/Time  ? CREATININE 0.76 05/11/2021 09:59 AM  ? CREATININE 0.75 03/23/2021 09:00 AM  ? GFRNONAA 71 05/29/2020 11:25 AM  ? GFRAA 81 05/29/2020 11:25 AM  ? ?BMP Latest Ref Rng & Units 05/11/2021 03/23/2021 08/11/2020  ?Glucose 70 - 99 mg/dL 101(H) 126(H) 122(H)  ?BUN 8 - 27 mg/dL 16 17 13   ?Creatinine 0.57 - 1.00 mg/dL 0.76 0.75 0.85  ?BUN/Creat Ratio 12 - 28 21 23 15   ?Sodium 134 - 144 mmol/L 140 140 139  ?Potassium 3.5 - 5.2 mmol/L 4.8 4.4 4.2  ?Chloride 96 - 106 mmol/L 101 99 98  ?CO2 20 - 29 mmol/L 23 25 23   ?Calcium 8.7 - 10.3 mg/dL 9.8 10.1 10.2  ? ?Current antihypertensive regimen:  ?Metoprolol Succinate 25 mg once daily until 4/4 ?Quinapril 40 mg once daily ?Amlodipine 5 mg once daily ? ?How often are you checking your Blood Pressure? daily ?Current home BP readings: 116/66 123/68 ? ?What recent interventions/DTPs have been made by any provider to improve Blood Pressure control since last CPP Visit: Patient was started on Metoprolol Succinate 25 mg once daily due to her being in AFIB and having flutters. Patient stated that she is schedule for a Cardioversion 04/05 if her heart doesn't regulate on it's on. Patient stated since she has started the Metoprolol she has noticed that her heart rate is not as elevated but she is still having the flutters. ? ?Any recent hospitalizations or ED visits since last visit with CPP? Yes ? ?What diet changes have been made to improve Blood Pressure Control?  ?Per patient there has been no changes to her diet. ? ?What exercise is being done to improve your Blood Pressure Control?  ?Due to patients current symptoms and condition there is no exercise regiment ? ?Adherence Review: ?Is the patient currently on ACE/ARB medication? Yes ?Does the patient have >5 day gap between last estimated fill dates? No ? ?Patient reports under the circumstances she is doing okay.  Patient stated that she is a little aggravated with her health, but she is hoping to get better soon. She reports seeing some improvement since she started taking the Metoprolol. Patient denies any ill symptoms at this moment, and reports taking all medications as directed. Patient currently has no concerns or issues that we can address at this time. Patient encouraged to call me if she needs anything.  ? ?Patient has a telephone appointment with Junius Argyle, CPP on 10/05/2021 @ 1100 ? ?Lynann Bologna, CPA/CMA ?Clinical Pharmacist Assistant ?Phone: [phone]  ? ? ? ?

## 2021-07-20 ENCOUNTER — Encounter (HOSPITAL_COMMUNITY): Payer: Self-pay | Admitting: Internal Medicine

## 2021-07-21 MED ORDER — FUROSEMIDE 40 MG PO TABS: 40.0000 mg | ORAL_TABLET | Freq: Every day | ORAL | 2 refills | Status: DC | PRN

## 2021-07-26 ENCOUNTER — Encounter: Payer: Self-pay | Admitting: Family Medicine

## 2021-07-28 ENCOUNTER — Other Ambulatory Visit: Payer: Self-pay

## 2021-07-28 ENCOUNTER — Ambulatory Visit (HOSPITAL_COMMUNITY)
Admission: RE | Admit: 2021-07-28 | Discharge: 2021-07-28 | Disposition: A | Discharge status: Home / Self Care | Payer: Medicare HMO | Source: Ambulatory Visit | Attending: Internal Medicine | Admitting: Internal Medicine

## 2021-07-28 ENCOUNTER — Ambulatory Visit (HOSPITAL_COMMUNITY): Payer: Medicare HMO | Admitting: Anesthesiology

## 2021-07-28 ENCOUNTER — Ambulatory Visit (HOSPITAL_BASED_OUTPATIENT_CLINIC_OR_DEPARTMENT_OTHER): Payer: Medicare HMO | Admitting: Anesthesiology

## 2021-07-28 ENCOUNTER — Encounter (HOSPITAL_COMMUNITY)
Admission: RE | Disposition: A | Discharge status: Home / Self Care | Payer: Self-pay | Source: Ambulatory Visit | Attending: Internal Medicine

## 2021-07-28 ENCOUNTER — Ambulatory Visit (HOSPITAL_COMMUNITY)
Admission: RE | Admit: 2021-07-28 | Discharge: 2021-07-28 | Disposition: A | Discharge status: Home / Self Care | Payer: Medicare HMO | Source: Ambulatory Visit | Attending: Physician Assistant | Admitting: Physician Assistant

## 2021-07-28 ENCOUNTER — Encounter (HOSPITAL_COMMUNITY): Payer: Self-pay | Admitting: Internal Medicine

## 2021-07-28 DIAGNOSIS — Z7901 Long term (current) use of anticoagulants: Secondary | ICD-10-CM | POA: Diagnosis not present

## 2021-07-28 DIAGNOSIS — K219 Gastro-esophageal reflux disease without esophagitis: Secondary | ICD-10-CM | POA: Diagnosis not present

## 2021-07-28 DIAGNOSIS — I2510 Atherosclerotic heart disease of native coronary artery without angina pectoris: Secondary | ICD-10-CM

## 2021-07-28 DIAGNOSIS — J449 Chronic obstructive pulmonary disease, unspecified: Secondary | ICD-10-CM | POA: Insufficient documentation

## 2021-07-28 DIAGNOSIS — I484 Atypical atrial flutter: Secondary | ICD-10-CM | POA: Insufficient documentation

## 2021-07-28 DIAGNOSIS — I4891 Unspecified atrial fibrillation: Secondary | ICD-10-CM

## 2021-07-28 DIAGNOSIS — I10 Essential (primary) hypertension: Secondary | ICD-10-CM

## 2021-07-28 DIAGNOSIS — E039 Hypothyroidism, unspecified: Secondary | ICD-10-CM | POA: Diagnosis not present

## 2021-07-28 DIAGNOSIS — I4819 Other persistent atrial fibrillation: Secondary | ICD-10-CM | POA: Diagnosis not present

## 2021-07-28 DIAGNOSIS — Z951 Presence of aortocoronary bypass graft: Secondary | ICD-10-CM | POA: Insufficient documentation

## 2021-07-28 DIAGNOSIS — Z87891 Personal history of nicotine dependence: Secondary | ICD-10-CM | POA: Diagnosis not present

## 2021-07-28 HISTORY — PX: CARDIOVERSION: SHX1299

## 2021-07-28 LAB — BASIC METABOLIC PANEL
Anion gap: 11 (ref 5–15)
BUN: 24 mg/dL — ABNORMAL HIGH (ref 8–23)
CO2: 24 mmol/L (ref 22–32)
Calcium: 9.6 mg/dL (ref 8.9–10.3)
Chloride: 103 mmol/L (ref 98–111)
Creatinine, Ser: 1.05 mg/dL — ABNORMAL HIGH (ref 0.44–1.00)
GFR, Estimated: 56 mL/min — ABNORMAL LOW (ref >60)
Glucose, Bld: 126 mg/dL — ABNORMAL HIGH (ref 70–99)
Potassium: 3.9 mmol/L (ref 3.5–5.1)
Sodium: 138 mmol/L (ref 135–145)

## 2021-07-28 LAB — CBC
HCT: 39.5 % (ref 36.0–46.0)
Hemoglobin: 13.2 g/dL (ref 12.0–15.0)
MCH: 32.5 pg (ref 26.0–34.0)
MCHC: 33.4 g/dL (ref 30.0–36.0)
MCV: 97.3 fL (ref 80.0–100.0)
Platelets: 159 10*3/uL (ref 150–400)
RBC: 4.06 MIL/uL (ref 3.87–5.11)
RDW: 12.7 % (ref 11.5–15.5)
WBC: 5.1 10*3/uL (ref 4.0–10.5)
nRBC: 0 % (ref 0.0–0.2)

## 2021-07-28 SURGERY — CARDIOVERSION
Anesthesia: General

## 2021-07-28 MED ORDER — PROPOFOL 10 MG/ML IV BOLUS
INTRAVENOUS | Status: DC | PRN
  Administered 2021-07-28: 80 mg via INTRAVENOUS

## 2021-07-28 MED ORDER — LIDOCAINE 2% (20 MG/ML) 5 ML SYRINGE
INTRAMUSCULAR | Status: DC | PRN
  Administered 2021-07-28: 40 mg via INTRAVENOUS

## 2021-07-28 MED ORDER — SODIUM CHLORIDE 0.9 % IV SOLN: INTRAVENOUS | Status: DC

## 2021-07-28 MED ORDER — EPHEDRINE SULFATE (PRESSORS) 50 MG/ML IJ SOLN
INTRAMUSCULAR | Status: DC | PRN
  Administered 2021-07-28: 10 mg via INTRAVENOUS

## 2021-07-28 NOTE — Discharge Instructions (Signed)

## 2021-07-28 NOTE — CV Procedure (Signed)
Procedure: Electrical Cardioversion ?Indications:  Atrial Fibrillation ? ?Procedure Details: ? ?Consent: Risks of procedure as well as the alternatives and risks of each were explained to the (patient/caregiver).  Consent for procedure obtained. ? ?Time Out: Verified patient identification, verified procedure, site/side was marked, verified correct patient position, special equipment/implants available, medications/allergies/relevent history reviewed, required imaging and test results available. PERFORMED. ? ?Patient placed on cardiac monitor, pulse oximetry, supplemental oxygen as necessary.  ?Sedation given:  Propofol ?Pacer pads placed anterior and posterior chest. ? ?Cardioverted 1 time(s).  ?Cardioversion with synchronized biphasic 200J shock. ? ?Evaluation: ?Findings: Post procedure EKG shows:  Sinus bradycardia ?Complications: None ?Patient did tolerate procedure well. ? ?Time Spent Directly with the Patient: ? ?20 minutes  ? ?Carolan Clines E ?07/28/2021, 10:40 AM ? ?

## 2021-07-28 NOTE — Transfer of Care (Signed)
Immediate Anesthesia Transfer of Care Note ? ?Patient: Penny Reed ? ?Procedure(s) Performed: CARDIOVERSION ? ?Patient Location: PACU ? ?Anesthesia Type:MAC ? ?Level of Consciousness: drowsy and patient cooperative ? ?Airway & Oxygen Therapy: Patient Spontanous Breathing ? ?Post-op Assessment: Report given to RN and Post -op Vital signs reviewed and stable ? ?Post vital signs: Reviewed and stable ? ?Last Vitals:  ?Vitals Value Taken Time  ?BP 109/47 07/28/21 1043  ?Temp 36.1 ?C 07/28/21 1043  ?Pulse 54 07/28/21 1045  ?Resp 16 07/28/21 1045  ?SpO2 98 % 07/28/21 1045  ?Vitals shown include unvalidated device data. ? ?Last Pain:  ?Vitals:  ? 07/28/21 1043  ?TempSrc: Temporal  ?PainSc: 0-No pain  ?   ? ?  ? ?Complications: No notable events documented. ?

## 2021-07-28 NOTE — Anesthesia Preprocedure Evaluation (Addendum)
Anesthesia Evaluation  ?Patient identified by MRN, date of birth, ID band ?Patient awake ? ? ? ?Reviewed: ?Allergy & Precautions, NPO status , Patient's Chart, lab work & pertinent test results ? ?History of Anesthesia Complications ?Negative for: history of anesthetic complications ? ?Airway ?Mallampati: I ? ?TM Distance: >3 FB ?Neck ROM: Full ? ? ? Dental ? ?(+) Caps, Dental Advisory Given ?  ?Pulmonary ?COPD,  COPD inhaler, former smoker,  ?  ?breath sounds clear to auscultation ? ? ? ? ? ? Cardiovascular ?hypertension, Pt. on medications and Pt. on home beta blockers ?(-) angina+ CAD and + CABG  ?+ dysrhythmias (s/p SVT ablation) Atrial Fibrillation  ?Rhythm:Irregular Rate:Normal ? ?1/23 ECHO: EF 60-65%. The LV has normal function, small region of hypokinesis in the mid to basal septal wall (wall thinning noted in this region) There is mild-mod asymmetric left ventricular hypertrophy of the basal-septal and septal segments. Grade II DD, mild MR, mild AI ?  ?Neuro/Psych ?negative neurological ROS ?   ? GI/Hepatic ?Neg liver ROS, GERD  Controlled,  ?Endo/Other  ?Hypothyroidism  ? Renal/GU ?negative Renal ROS  ? ?  ?Musculoskeletal ? ? Abdominal ?  ?Peds ? Hematology ?Xarelto   ?Anesthesia Other Findings ? ? Reproductive/Obstetrics ? ?  ? ? ? ? ? ? ? ? ? ? ? ? ? ?  ?  ? ? ? ? ? ? ?Anesthesia Physical ?Anesthesia Plan ? ?ASA: 3 ? ?Anesthesia Plan: General  ? ?Post-op Pain Management: Minimal or no pain anticipated  ? ?Induction: Intravenous ? ?PONV Risk Score and Plan: 3 and Treatment may vary due to age or medical condition ? ?Airway Management Planned: Natural Airway and Mask ? ?Additional Equipment: None ? ?Intra-op Plan:  ? ?Post-operative Plan:  ? ?Informed Consent: I have reviewed the patients History and Physical, chart, labs and discussed the procedure including the risks, benefits and alternatives for the proposed anesthesia with the patient or authorized representative who  has indicated his/her understanding and acceptance.  ? ? ? ?Dental advisory given ? ?Plan Discussed with: CRNA and Surgeon ? ?Anesthesia Plan Comments:   ? ? ? ? ? ?Anesthesia Quick Evaluation ? ?

## 2021-07-28 NOTE — Interval H&P Note (Signed)
History and Physical Interval Note: ? ?07/28/2021 ?9:17 AM ? ?Penny Reed  has presented today for surgery, with the diagnosis of afib.  The various methods of treatment have been discussed with the patient and family. After consideration of risks, benefits and other options for treatment, the patient has consented to  Procedure(s): ?CARDIOVERSION (N/A) as a surgical intervention.  The patient's history has been reviewed, patient examined, no change in status, stable for surgery.  I have reviewed the patient's chart and labs.  Questions were answered to the patient's satisfaction.   ? ? ?Carolan Clines E ? ? ?

## 2021-07-28 NOTE — Anesthesia Postprocedure Evaluation (Signed)
Anesthesia Post Note ? ?Patient: Penny Reed ? ?Procedure(s) Performed: CARDIOVERSION ? ?  ? ?Patient location during evaluation: Endoscopy ?Anesthesia Type: General ?Level of consciousness: awake and alert, patient cooperative and oriented ?Pain management: pain level controlled ?Vital Signs Assessment: post-procedure vital signs reviewed and stable ?Respiratory status: spontaneous breathing, nonlabored ventilation and respiratory function stable ?Cardiovascular status: stable and blood pressure returned to baseline ?Postop Assessment: able to ambulate, adequate PO intake and no apparent nausea or vomiting ?Anesthetic complications: no ? ? ?No notable events documented. ? ?Last Vitals:  ?Vitals:  ? 07/28/21 1100 07/28/21 1110  ?BP: (!) 109/53 (!) 102/55  ?Pulse: (!) 55 (!) 55  ?Resp: 15 14  ?Temp:    ?SpO2: 94% 96%  ?  ?Last Pain:  ?Vitals:  ? 07/28/21 1053  ?TempSrc:   ?PainSc: 0-No pain  ? ? ?  ?  ?  ?  ?  ?  ? ?Darriana Deboy,E. Jeremih Dearmas ? ? ? ? ?

## 2021-07-29 ENCOUNTER — Encounter (HOSPITAL_COMMUNITY): Payer: Self-pay | Admitting: Internal Medicine

## 2021-08-09 ENCOUNTER — Ambulatory Visit (HOSPITAL_COMMUNITY)
Admission: RE | Admit: 2021-08-09 | Discharge: 2021-08-09 | Disposition: A | Discharge status: Home / Self Care | Payer: Medicare HMO | Source: Ambulatory Visit | Attending: Physician Assistant | Admitting: Physician Assistant

## 2021-08-09 VITALS — BP 130/50 | HR 45 | Ht 67.0 in | Wt 186.4 lb

## 2021-08-09 DIAGNOSIS — Z79899 Other long term (current) drug therapy: Secondary | ICD-10-CM | POA: Diagnosis not present

## 2021-08-09 DIAGNOSIS — I4819 Other persistent atrial fibrillation: Secondary | ICD-10-CM | POA: Diagnosis not present

## 2021-08-09 DIAGNOSIS — I4892 Unspecified atrial flutter: Secondary | ICD-10-CM | POA: Diagnosis not present

## 2021-08-09 DIAGNOSIS — Z951 Presence of aortocoronary bypass graft: Secondary | ICD-10-CM | POA: Insufficient documentation

## 2021-08-09 DIAGNOSIS — J449 Chronic obstructive pulmonary disease, unspecified: Secondary | ICD-10-CM | POA: Insufficient documentation

## 2021-08-09 DIAGNOSIS — E039 Hypothyroidism, unspecified: Secondary | ICD-10-CM | POA: Diagnosis not present

## 2021-08-09 DIAGNOSIS — D6869 Other thrombophilia: Secondary | ICD-10-CM | POA: Diagnosis not present

## 2021-08-09 DIAGNOSIS — E785 Hyperlipidemia, unspecified: Secondary | ICD-10-CM | POA: Diagnosis not present

## 2021-08-09 DIAGNOSIS — I2510 Atherosclerotic heart disease of native coronary artery without angina pectoris: Secondary | ICD-10-CM | POA: Diagnosis not present

## 2021-08-09 DIAGNOSIS — Z7901 Long term (current) use of anticoagulants: Secondary | ICD-10-CM | POA: Insufficient documentation

## 2021-08-09 DIAGNOSIS — I10 Essential (primary) hypertension: Secondary | ICD-10-CM | POA: Insufficient documentation

## 2021-08-09 NOTE — Patient Instructions (Signed)
Stop metoprolol

## 2021-08-09 NOTE — Progress Notes (Signed)
? ? ?Primary Care Physician: Jerrol Banana., MD ?Primary Cardiologist: Dr Clayborn Bigness ?Primary Electrophysiologist: Dr Quentin Ore ?Referring Physician: Dr Quentin Ore ? ? ?Penny Reed is a 76 y.o. female with a history of CAD s/p CABG, COPD, HTN, hypothyroidism, HLD, atrial fibrillation who presents for follow up in the [facility]. She had an EP study on Sep 04, 2020.  During the EP study multiple different atypical atrial flutters were induced with aberrant ventricular conduction. Patient is on Xarelto for a CHADS2VASC score of 4. She underwent afib and flutter ablation with Dr Quentin Ore on 05/31/21. Patient reports that she woke on 07/06/21 with fatigue, lightheadedness, and elevated heart rates. She checked her Jodelle Red mobile which showed afib. There were no specific triggers that she could identify.  ? ?On follow up today, patient is s/p DCCV on 07/28/21. She is in SR and bradycardic today. She does feel fatigued still. No bleeding issues on anticoagulation.   ? ?Today, she denies symptoms of palpitations, chest pain, orthopnea, PND, lower extremity edema, presyncope, syncope, snoring, daytime somnolence, bleeding, or neurologic sequela. The patient is tolerating medications without difficulties and is otherwise without complaint today.  ? ? ?Atrial Fibrillation Risk Factors: ? ?she does not have symptoms or diagnosis of sleep apnea. ?she does not have a history of rheumatic fever. ? ? ?she has a BMI of Body mass index is 29.19 kg/m?Marland KitchenMarland Kitchen ?Filed Weights  ? 08/09/21 1320  ?Weight: 84.6 kg  ? ? ? ?Family History  ?Problem Relation Age of Onset  ? Diabetes Mother   ? Stroke Mother   ?     cause of death  ? Coronary artery disease Father   ? Heart attack Father   ? Hyperlipidemia Sister   ? Hypertension Sister   ? Breast cancer Sister 16  ? Cancer Sister   ?     breast cancer  ? Asthma Sister   ? Scleroderma Sister   ? Thyroid disease Sister   ? Heart attack Brother   ? Cancer - Colon Paternal  Grandmother   ? Breast cancer Maternal Aunt   ?     21s  ? ? ? ?Atrial Fibrillation Management history: ? ?Previous antiarrhythmic drugs: none ?Previous cardioversions: 04/02/22, 07/28/21 ?Previous ablations: 05/31/21 ?CHADS2VASC score: 4 ?Anticoagulation history: Eliquis (had lip swelling), Xarelto  ? ? ?Past Medical History:  ?Diagnosis Date  ? Allergic rhinitis   ? CAD (coronary artery disease)   ? Complication of anesthesia   ? woke up during colonoscopy  ? Hyperlipidemia   ? Hypertension   ? Hypothyroidism   ? LVH (left ventricular hypertrophy)   ? ?Past Surgical History:  ?Procedure Laterality Date  ? ABDOMINAL HYSTERECTOMY    ? has left ovary  ? APPENDECTOMY    ? ATRIAL FIBRILLATION ABLATION N/A 05/31/2021  ? Procedure: ATRIAL FIBRILLATION ABLATION;  Surgeon: Vickie Epley, MD;  Location: [place] [facility];  Service: Cardiovascular;  Laterality: N/A;  ? CARDIAC CATHETERIZATION  09/2005  ? had 1 stent and 3 balloons placed  ? CARDIOVERSION N/A 04/02/2021  ? Procedure: CARDIOVERSION;  Surgeon: Freada Bergeron, MD;  Location: [facility] ENDOSCOPY;  Service: Cardiovascular;  Laterality: N/A;  ? CARDIOVERSION N/A 07/28/2021  ? Procedure: CARDIOVERSION;  Surgeon: Janina Mayo, MD;  Location: [facility] ENDOSCOPY;  Service: Cardiovascular;  Laterality: N/A;  ? COLONOSCOPY WITH PROPOFOL N/A 12/04/2019  ? Procedure: COLONOSCOPY WITH PROPOFOL;  Surgeon: Robert Bellow, MD;  Location: [facility] ENDOSCOPY;  Service: Endoscopy;  Laterality:  N/A;  ? CORONARY ARTERY BYPASS GRAFT    ? SVT ABLATION N/A 09/04/2020  ? Procedure: SVT ABLATION;  Surgeon: Vickie Epley, MD;  Location: [place] [facility];  Service: Cardiovascular;  Laterality: N/A;  ? TONSILLECTOMY    ? ? ?Current Outpatient Medications  ?Medication Sig Dispense Refill  ? albuterol (PROAIR HFA) 108 (90 BASE) MCG/ACT inhaler Inhale 2 puffs into the lungs every 6 (six) hours as needed for wheezing or shortness of breath. 3 Inhaler 3  ? amLODipine (NORVASC) 5 MG tablet TAKE 1  TABLET BY MOUTH EVERY DAY 90 tablet 4  ? atorvastatin (LIPITOR) 80 MG tablet TAKE 1 TABLET BY MOUTH EVERY DAY 90 tablet 3  ? Azelastine HCl 137 MCG/SPRAY SOLN SPRAY 2 SPRAYS INTO EACH NOSTRIL EVERY DAY 30 mL 3  ? Calcium Carbonate (CALCIUM 600 PO) Take 600 mg by mouth in the morning and at bedtime.    ? cetirizine (ZYRTEC) 10 MG tablet Take 10 mg by mouth daily as needed for allergies.    ? cholecalciferol (VITAMIN D3) 25 MCG (1000 UNIT) tablet Take 1,000 Units by mouth daily.    ? furosemide (LASIX) 40 MG tablet Take 1 tablet (40 mg total) by mouth daily as needed (swelling/weight gain). 30 tablet 2  ? levothyroxine (SYNTHROID) 112 MCG tablet TAKE 1 TABLET BY MOUTH SIX DAYS PER WEEK. TAKE 1 AND 1/2 TABLET BY MOUTH ONE DAY PER WEEK. (Patient taking differently: TAKE 1 TABLET BY MOUTH SIX DAYS PER WEEK. TAKE 1 AND 1/2 TABLET BY MOUTH ONE DAY PER WEEK [facility])) 100 tablet 2  ? Multiple Vitamin (MULTIVITAMIN) tablet Take 1 tablet by mouth daily. Standard Process    ? nitroGLYCERIN (NITROSTAT) 0.4 MG SL tablet Place 0.4 mg under the tongue every 5 (five) minutes as needed for chest pain.     ? Omega-3 Fatty Acids (FISH OIL) 1000 MG CAPS Take 3,000 mg by mouth at bedtime.    ? quinapril (ACCUPRIL) 40 MG tablet Take 1 tablet (40 mg total) by mouth daily. 30 tablet 11  ? rivaroxaban (XARELTO) 20 MG TABS tablet TAKE 1 TABLET BY MOUTH DAILY WITH SUPPER. STOP ELIQUIS 90 tablet 1  ? umeclidinium-vilanterol (ANORO ELLIPTA) 62.5-25 MCG/ACT AEPB INHALE 1 PUFF BY MOUTH EVERY DAY 120 each 3  ? zolpidem (AMBIEN) 5 MG tablet TAKE 1 TABLET BY MOUTH EVERY DAY AT BEDTIME AS NEEDED FOR SLEEP 30 tablet 3  ? ?No current facility-administered medications for this encounter.  ? ? ?Allergies  ?Allergen Reactions  ? Adhesive [Tape]   ?  Band-aids irritate skin  ? Azithromycin Diarrhea and Nausea And Vomiting  ? Eliquis [Apixaban] Swelling  ?  Leg and ankle swelling  ? ? ?Social History  ? ?Socioeconomic History  ? Marital status: Married   ?  Spouse name: Not on file  ? Number of children: 3  ? Years of education: Not on file  ? Highest education level: Associate degree: occupational, Hotel manager, or vocational program  ?Occupational History  ? Not on file  ?Tobacco Use  ? Smoking status: Former  ?  Packs/day: 0.25  ?  Years: 25.00  ?  Pack years: 6.25  ?  Types: Cigarettes  ?  Quit date: 04/25/2005  ?  Years since quitting: 16.3  ? Smokeless tobacco: Never  ? Tobacco comments:  ?  Former smoker 06/28/21  ?Vaping Use  ? Vaping Use: Never used  ?Substance and Sexual Activity  ? Alcohol use: Yes  ?  Alcohol/week: 6.0 -  14.0 standard drinks  ?  Types: 6 - 14 Glasses of wine per week  ?  Comment: 2 glasses in a setting over 3-7 days a week 06/28/21  ? Drug use: No  ? Sexual activity: Not on file  ?Other Topics Concern  ? Not on file  ?Social History Narrative  ? Not on file  ? ?Social Determinants of Health  ? ?Financial Resource Strain: Low Risk   ? Difficulty of Paying Living Expenses: Not hard at all  ?Food Insecurity: Not on file  ?Transportation Needs: Not on file  ?Physical Activity: Not on file  ?Stress: Not on file  ?Social Connections: Not on file  ?Intimate Partner Violence: Not on file  ? ? ? ?ROS- All systems are reviewed and negative except as per the HPI above. ? ?Physical Exam: ?Vitals:  ? 08/09/21 1320  ?BP: (!) 130/50  ?Pulse: (!) 45  ?Weight: 84.6 kg  ?Height: 5\' 7"  (1.702 m)  ? ? ? ?GEN- The patient is a well appearing female, alert and oriented x 3 today.   ?HEENT-head normocephalic, atraumatic, sclera clear, conjunctiva pink, hearing intact, trachea midline. ?Lungs- Clear to ausculation bilaterally, normal work of breathing ?Heart- Regular rate and rhythm, bradycardia, no murmurs, rubs or gallops  ?GI- soft, NT, ND, + BS ?Extremities- no clubbing, cyanosis, or edema ?MS- no significant deformity or atrophy ?Skin- no rash or lesion ?Psych- euthymic mood, full affect ?Neuro- strength and sensation are intact ? ? ?Wt Readings from Last 3  Encounters:  ?08/09/21 84.6 kg  ?07/28/21 81.6 kg  ?07/09/21 82.7 kg  ? ? ?EKG today demonstrates  ?SB, NST ?Vent. rate 45 BPM ?PR interval 182 ms ?QRS duration 94 ms ?QT/QTcB 490/423 ms ? ?Echo 05/25/21 dem

## 2021-08-10 DIAGNOSIS — Z951 Presence of aortocoronary bypass graft: Secondary | ICD-10-CM | POA: Diagnosis not present

## 2021-08-10 DIAGNOSIS — J449 Chronic obstructive pulmonary disease, unspecified: Secondary | ICD-10-CM | POA: Diagnosis not present

## 2021-08-10 DIAGNOSIS — I739 Peripheral vascular disease, unspecified: Secondary | ICD-10-CM | POA: Diagnosis not present

## 2021-08-10 DIAGNOSIS — R000 Tachycardia, unspecified: Secondary | ICD-10-CM | POA: Diagnosis not present

## 2021-08-10 DIAGNOSIS — I10 Essential (primary) hypertension: Secondary | ICD-10-CM | POA: Diagnosis not present

## 2021-08-10 DIAGNOSIS — R001 Bradycardia, unspecified: Secondary | ICD-10-CM | POA: Diagnosis not present

## 2021-08-10 DIAGNOSIS — R0989 Other specified symptoms and signs involving the circulatory and respiratory systems: Secondary | ICD-10-CM | POA: Diagnosis not present

## 2021-08-10 DIAGNOSIS — I7121 Aneurysm of the ascending aorta, without rupture: Secondary | ICD-10-CM | POA: Diagnosis not present

## 2021-08-10 DIAGNOSIS — I2510 Atherosclerotic heart disease of native coronary artery without angina pectoris: Secondary | ICD-10-CM | POA: Diagnosis not present

## 2021-08-10 DIAGNOSIS — I209 Angina pectoris, unspecified: Secondary | ICD-10-CM | POA: Diagnosis not present

## 2021-08-12 ENCOUNTER — Other Ambulatory Visit: Payer: Self-pay | Admitting: Internal Medicine

## 2021-08-12 DIAGNOSIS — I25118 Atherosclerotic heart disease of native coronary artery with other forms of angina pectoris: Secondary | ICD-10-CM

## 2021-08-12 DIAGNOSIS — I209 Angina pectoris, unspecified: Secondary | ICD-10-CM

## 2021-08-12 DIAGNOSIS — I739 Peripheral vascular disease, unspecified: Secondary | ICD-10-CM

## 2021-08-27 DIAGNOSIS — I2510 Atherosclerotic heart disease of native coronary artery without angina pectoris: Secondary | ICD-10-CM | POA: Diagnosis not present

## 2021-08-27 DIAGNOSIS — R0989 Other specified symptoms and signs involving the circulatory and respiratory systems: Secondary | ICD-10-CM | POA: Diagnosis not present

## 2021-08-27 DIAGNOSIS — I6523 Occlusion and stenosis of bilateral carotid arteries: Secondary | ICD-10-CM | POA: Diagnosis not present

## 2021-08-27 DIAGNOSIS — I739 Peripheral vascular disease, unspecified: Secondary | ICD-10-CM | POA: Diagnosis not present

## 2021-08-27 DIAGNOSIS — I771 Stricture of artery: Secondary | ICD-10-CM | POA: Diagnosis not present

## 2021-08-27 DIAGNOSIS — I209 Angina pectoris, unspecified: Secondary | ICD-10-CM | POA: Diagnosis not present

## 2021-08-31 NOTE — Progress Notes (Signed)
?Electrophysiology Office Follow up Visit Note:   ? ?Date:  09/01/2021  ? ?ID:  Penny Reed, DOB March 25, 1946, MRN [id number] ? ?PCP:  Jerrol Banana., MD  ?[place] Cardiologist:  None  ?[facility] Electrophysiologist:  Vickie Epley, MD  ? ? ?Interval History:   ? ?Penny Reed is a 76 y.o. female who presents for a follow up visit. They were last seen in clinic 04/28/2021. She then had a PVI on 05/31/2021. During the procedure, the veins, posterior wall and CTI were ablated. She did have a cardioversion after the procedure and follow up ECG on 08/09/2021 showed sinus bradycardia.  She has done well since the cardioversion and has maintained normal rhythm.  She is very active.  She does aerobic exercise at least 4 days a week and weights the other days.  Her heart rates will get up to 140 bpm and then come down as she rests.  She feels overall well.  She tolerates her Xarelto without bleeding issues. ? ? ?  ? ?Past Medical History:  ?Diagnosis Date  ? Allergic rhinitis   ? CAD (coronary artery disease)   ? Complication of anesthesia   ? woke up during colonoscopy  ? Hyperlipidemia   ? Hypertension   ? Hypothyroidism   ? LVH (left ventricular hypertrophy)   ? ? ?Past Surgical History:  ?Procedure Laterality Date  ? ABDOMINAL HYSTERECTOMY    ? has left ovary  ? APPENDECTOMY    ? ATRIAL FIBRILLATION ABLATION N/A 05/31/2021  ? Procedure: ATRIAL FIBRILLATION ABLATION;  Surgeon: Vickie Epley, MD;  Location: [place] [facility];  Service: Cardiovascular;  Laterality: N/A;  ? CARDIAC CATHETERIZATION  09/2005  ? had 1 stent and 3 balloons placed  ? CARDIOVERSION N/A 04/02/2021  ? Procedure: CARDIOVERSION;  Surgeon: Freada Bergeron, MD;  Location: [facility] ENDOSCOPY;  Service: Cardiovascular;  Laterality: N/A;  ? CARDIOVERSION N/A 07/28/2021  ? Procedure: CARDIOVERSION;  Surgeon: Janina Mayo, MD;  Location: [facility] ENDOSCOPY;  Service: Cardiovascular;  Laterality: N/A;  ? COLONOSCOPY WITH PROPOFOL N/A  12/04/2019  ? Procedure: COLONOSCOPY WITH PROPOFOL;  Surgeon: Robert Bellow, MD;  Location: [facility] ENDOSCOPY;  Service: Endoscopy;  Laterality: N/A;  ? CORONARY ARTERY BYPASS GRAFT    ? SVT ABLATION N/A 09/04/2020  ? Procedure: SVT ABLATION;  Surgeon: Vickie Epley, MD;  Location: [place] [facility];  Service: Cardiovascular;  Laterality: N/A;  ? TONSILLECTOMY    ? ? ?Current Medications: ?Current Meds  ?Medication Sig  ? albuterol (PROAIR HFA) 108 (90 BASE) MCG/ACT inhaler Inhale 2 puffs into the lungs every 6 (six) hours as needed for wheezing or shortness of breath.  ? amLODipine (NORVASC) 5 MG tablet TAKE 1 TABLET BY MOUTH EVERY DAY  ? atorvastatin (LIPITOR) 80 MG tablet TAKE 1 TABLET BY MOUTH EVERY DAY  ? Azelastine HCl 137 MCG/SPRAY SOLN SPRAY 2 SPRAYS INTO EACH NOSTRIL EVERY DAY  ? Calcium Carbonate (CALCIUM 600 PO) Take 600 mg by mouth in the morning and at bedtime.  ? cetirizine (ZYRTEC) 10 MG tablet Take 10 mg by mouth daily as needed for allergies.  ? cholecalciferol (VITAMIN D3) 25 MCG (1000 UNIT) tablet Take 1,000 Units by mouth daily.  ? furosemide (LASIX) 40 MG tablet Take 1 tablet (40 mg total) by mouth daily as needed (swelling/weight gain).  ? levothyroxine (SYNTHROID) 112 MCG tablet TAKE 1 TABLET BY MOUTH SIX DAYS PER WEEK. TAKE 1 AND 1/2 TABLET BY MOUTH ONE DAY  PER WEEK. (Patient taking differently: TAKE 1 TABLET BY MOUTH SIX DAYS PER WEEK. TAKE 1 AND 1/2 TABLET BY MOUTH ONE DAY PER WEEK [facility]))  ? Multiple Vitamin (MULTIVITAMIN) tablet Take 1 tablet by mouth daily. Standard Process  ? nitroGLYCERIN (NITROSTAT) 0.4 MG SL tablet Place 0.4 mg under the tongue every 5 (five) minutes as needed for chest pain.   ? Omega-3 Fatty Acids (FISH OIL) 1000 MG CAPS Take 3,000 mg by mouth at bedtime.  ? quinapril (ACCUPRIL) 40 MG tablet Take 1 tablet (40 mg total) by mouth daily.  ? rivaroxaban (XARELTO) 20 MG TABS tablet TAKE 1 TABLET BY MOUTH DAILY WITH SUPPER. STOP ELIQUIS  ?  umeclidinium-vilanterol (ANORO ELLIPTA) 62.5-25 MCG/ACT AEPB INHALE 1 PUFF BY MOUTH EVERY DAY  ? zolpidem (AMBIEN) 5 MG tablet TAKE 1 TABLET BY MOUTH EVERY DAY AT BEDTIME AS NEEDED FOR SLEEP  ?  ? ?Allergies:   Adhesive [tape], Azithromycin, and Eliquis [apixaban]  ? ?Social History  ? ?Socioeconomic History  ? Marital status: Married  ?  Spouse name: Not on file  ? Number of children: 3  ? Years of education: Not on file  ? Highest education level: Associate degree: occupational, Hotel manager, or vocational program  ?Occupational History  ? Not on file  ?Tobacco Use  ? Smoking status: Former  ?  Packs/day: 0.25  ?  Years: 25.00  ?  Pack years: 6.25  ?  Types: Cigarettes  ?  Quit date: 04/25/2005  ?  Years since quitting: 16.3  ? Smokeless tobacco: Never  ? Tobacco comments:  ?  Former smoker 06/28/21  ?Vaping Use  ? Vaping Use: Never used  ?Substance and Sexual Activity  ? Alcohol use: Yes  ?  Alcohol/week: 6.0 - 14.0 standard drinks  ?  Types: 6 - 14 Glasses of wine per week  ?  Comment: 2 glasses in a setting over 3-7 days a week 06/28/21  ? Drug use: No  ? Sexual activity: Not on file  ?Other Topics Concern  ? Not on file  ?Social History Narrative  ? Not on file  ? ?Social Determinants of Health  ? ?Financial Resource Strain: Low Risk   ? Difficulty of Paying Living Expenses: Not hard at all  ?Food Insecurity: Not on file  ?Transportation Needs: Not on file  ?Physical Activity: Not on file  ?Stress: Not on file  ?Social Connections: Not on file  ?  ? ?Family History: ?The patient's family history includes Asthma in her sister; Breast cancer in her maternal aunt; Breast cancer (age of onset: 5) in her sister; Cancer in her sister; Cancer - Colon in her paternal grandmother; Coronary artery disease in her father; Diabetes in her mother; Heart attack in her brother and father; Hyperlipidemia in her sister; Hypertension in her sister; Scleroderma in her sister; Stroke in her mother; Thyroid disease in her  sister. ? ?ROS:   ?Please see the history of present illness.    ?All other systems reviewed and are negative. ? ?EKGs/Labs/Other Studies Reviewed:   ? ?The following studies were reviewed today: ? ? ?EKG:  The ekg ordered today demonstrates sinus rhythm with a ventricular rate of 49 bpm. ? ?Recent Labs: ?03/23/2021: ALT 23; TSH 2.310 ?07/28/2021: BUN 24; Creatinine, Ser 1.05; Hemoglobin 13.2; Platelets 159; Potassium 3.9; Sodium 138  ?Recent Lipid Panel ?   ?Component Value Date/Time  ? CHOL 172 03/23/2021 0900  ? TRIG 69 03/23/2021 0900  ? HDL 71 03/23/2021 0900  ? CHOLHDL  2.4 03/23/2021 0900  ? [place] 88 03/23/2021 0900  ? ? ?Physical Exam:   ? ?VS:  BP (!) 114/58   Pulse (!) 49   Ht 5\' 7"  (1.702 m)   Wt 187 lb (84.8 kg)   SpO2 97%   BMI 29.29 kg/m?    ? ?Wt Readings from Last 3 Encounters:  ?09/01/21 187 lb (84.8 kg)  ?08/09/21 186 lb 6.4 oz (84.6 kg)  ?07/28/21 180 lb (81.6 kg)  ?  ? ?GEN:  Well nourished, well developed in no acute distress ?HEENT: Normal ?NECK: No JVD; No carotid bruits ?LYMPHATICS: No lymphadenopathy ?CARDIAC: RRR, no murmurs, rubs, gallops ?RESPIRATORY:  Clear to auscultation without rales, wheezing or rhonchi  ?ABDOMEN: Soft, non-tender, non-distended ?MUSCULOSKELETAL:  No edema; No deformity  ?SKIN: Warm and dry ?NEUROLOGIC:  Alert and oriented x 3 ?PSYCHIATRIC:  Normal affect  ? ? ? ?  ? ?ASSESSMENT:   ? ?1. Persistent atrial fibrillation ([place])   ?2. Atypical atrial flutter ([place])   ?3. Coronary artery disease involving native coronary artery of native heart without angina pectoris   ? ?PLAN:   ? ?In order of problems listed above: ? ?#Persistent atrial fibrillation ?Doing well after her ablation.  Maintaining normal rhythm.  Continue current medications including Xarelto for stroke prophylaxis.  If she were to have recurrence, favor redo catheter ablation. ? ?#Coronary artery disease ?Asymptomatic ? ? ? ?Medication Adjustments/Labs and Tests Ordered: ?Current medicines are reviewed  at length with the patient today.  Concerns regarding medicines are outlined above.  ?No orders of the defined types were placed in this encounter. ? ?No orders of the defined types were placed in this encounter. ? ? ? ?Signed, ?Florene Glen

## 2021-09-01 ENCOUNTER — Encounter: Payer: Self-pay | Admitting: Cardiology

## 2021-09-01 ENCOUNTER — Ambulatory Visit: Payer: Medicare HMO | Admitting: Cardiology

## 2021-09-01 VITALS — BP 114/58 | HR 49 | Ht 67.0 in | Wt 187.0 lb

## 2021-09-01 DIAGNOSIS — I484 Atypical atrial flutter: Secondary | ICD-10-CM | POA: Diagnosis not present

## 2021-09-01 DIAGNOSIS — I4819 Other persistent atrial fibrillation: Secondary | ICD-10-CM

## 2021-09-01 DIAGNOSIS — I2510 Atherosclerotic heart disease of native coronary artery without angina pectoris: Secondary | ICD-10-CM | POA: Diagnosis not present

## 2021-09-01 IMAGING — MR MR CARD MORPHOLOGY WO/W CM
45 of 48 series · 45 of 48 positions shown · IV contrast (Contrast agent)
Comparison: none

CLINICAL DATA: Hx of CABG, wide complex tachycardia

EXAM:
CARDIAC MRI
TECHNIQUE: The patient was scanned on a 1.5 Tesla GE magnet. A dedicated
cardiac coil was used. Functional imaging was done using Fiesta
sequences. [DATE], and 4 chamber views were done to assess for RWMA's.
Modified Amazigh rule using a short axis stack was used to
calculate an ejection fraction on a dedicated work station using
Circle software. The patient received 10 cc of Gadavist. After 10
minutes inversion recovery sequences were used to assess for
infiltration and scar tissue.
CONTRAST:  10 cc  of Gadavist

[Series 4: t2_haste_db_tra_bh · axial · 8.0mm · 1.41mm/px · 1 of 16 slices shown]
[im 1/16]
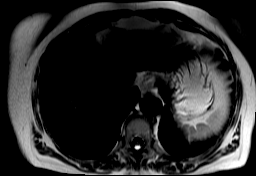

[Series 8: bSSFP · coronal · 8.0mm · 1.61mm/px · 1 of 25 slices shown (1 of 20)]
[im 1/25]
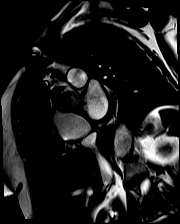

[Series 9: bSSFP · coronal · 8.0mm · 1.61mm/px · 1 of 25 slices shown (2 of 20)]
[im 1/25]
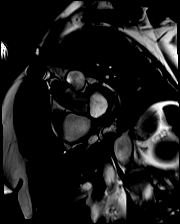

[Series 10: bSSFP · coronal · 8.0mm · 1.61mm/px · 1 of 25 slices shown (3 of 20)]
[im 1/25]
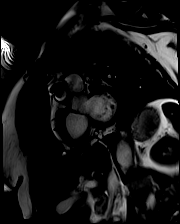

[Series 11: bSSFP · coronal · 8.0mm · 1.61mm/px · 1 of 25 slices shown (4 of 20)]
[im 1/25]
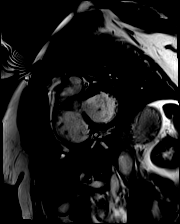

[Series 12: bSSFP · coronal · 8.0mm · 1.61mm/px · 1 of 25 slices shown (5 of 20)]
[im 1/25]
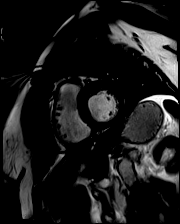

[Series 13: bSSFP · coronal · 8.0mm · 1.61mm/px · 1 of 25 slices shown (6 of 20)]
[im 1/25]
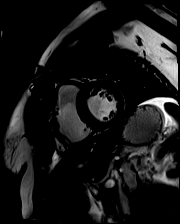

[Series 14: bSSFP · coronal · 8.0mm · 1.61mm/px · 1 of 25 slices shown (7 of 20)]
[im 1/25]
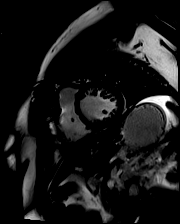

[Series 15: bSSFP · coronal · 8.0mm · 1.61mm/px · 1 of 25 slices shown (8 of 20)]
[im 1/25]
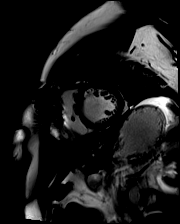

[Series 16: bSSFP · coronal · 8.0mm · 1.61mm/px · 1 of 25 slices shown (9 of 20)]
[im 1/25]
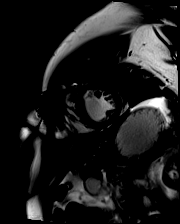

[Series 17: bSSFP · coronal · 8.0mm · 1.61mm/px · 1 of 25 slices shown (10 of 20)]
[im 1/25]
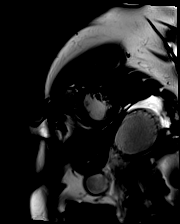

[Series 18: bSSFP · coronal · 8.0mm · 1.61mm/px · 1 of 25 slices shown (11 of 20)]
[im 1/25]
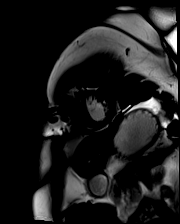

[Series 19: bSSFP · coronal · 8.0mm · 1.61mm/px · 1 of 25 slices shown (12 of 20)]
[im 1/25]
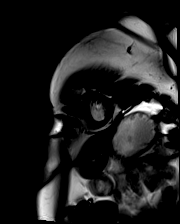

[Series 20: bSSFP · coronal · 8.0mm · 1.61mm/px · 1 of 25 slices shown (13 of 20)]
[im 1/25]
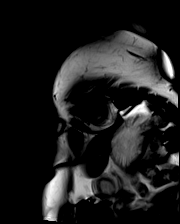

[Series 21: bSSFP · coronal · 8.0mm · 1.61mm/px · 1 of 25 slices shown (14 of 20)]
[im 1/25]
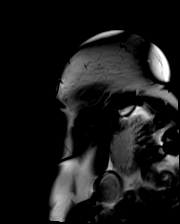

[Series 22: bSSFP · coronal · 8.0mm · 1.61mm/px · 1 of 25 slices shown (15 of 20)]
[im 1/25]
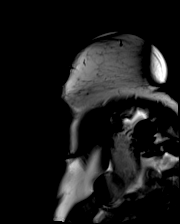

[Series 23: bSSFP · coronal · 8.0mm · 1.61mm/px · 1 of 25 slices shown (16 of 20)]
[im 1/25]
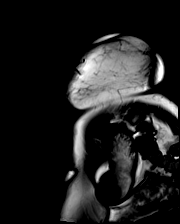

[Series 24: bSSFP · oblique · 6.0mm · 1.41mm/px · 1 of 25 slices shown (17 of 20)]
[im 1/25]
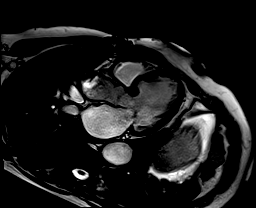

[Series 25: bSSFP · axial · 6.0mm · 1.41mm/px · 1 of 25 slices shown (18 of 20)]
[im 1/25]
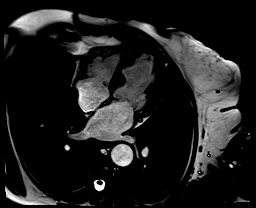

[Series 26: bSSFP · sagittal · 6.0mm · 1.41mm/px · 1 of 25 slices shown (19 of 20)]
[im 1/25]
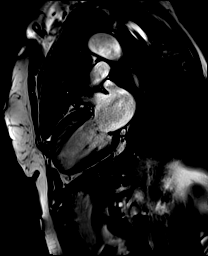

[Series 27: (id)_long_t1 · coronal · 8.0mm · 1.56mm/px · 1 of 24 slices shown]
[im 1/24]
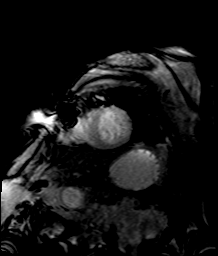

[Series 28: (id)_long_t1_moco · coronal · 8.0mm · 1.56mm/px · 1 of 24 slices shown]
[im 1/24]
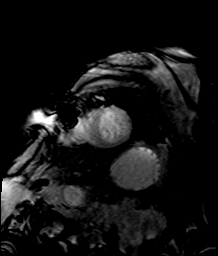

[Series 31: (id)_trufi · coronal · 8.0mm · 2.08mm/px · 1 of 9 slices shown]
[im 1/9]
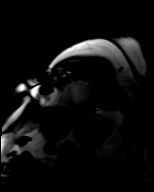

[Series 32: (id)_trufi_moco · coronal · 8.0mm · 2.08mm/px · 1 of 9 slices shown]
[im 1/9]
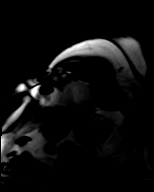

[Series 35: t1_tse_db short axis · axial · 8.0mm · 1.32mm/px · 1 of 14 slices shown]
[im 1/14]
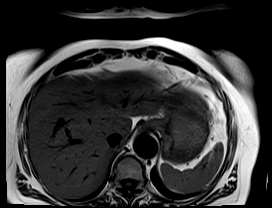

[Series 36: t1_tse_fs db short · axial · 8.0mm · 1.32mm/px · 1 of 14 slices shown]
[im 1/14]
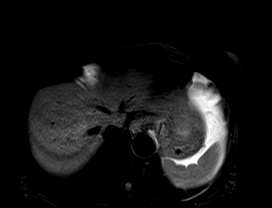

[Series 37: pre short axis · coronal · non-contrast · 8.0mm · 2.25mm/px · 1 of 10 slices shown (1 of 6)]
[im 1/10]
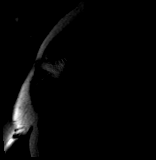

[Series 38: pre short axis · coronal · non-contrast · 8.0mm · 2.25mm/px · 1 of 10 slices shown (2 of 6)]
[im 1/10]
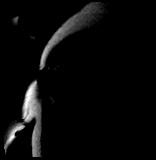

[Series 39: pre short axis · coronal · non-contrast · 8.0mm · 2.25mm/px · 1 of 10 slices shown (3 of 6)]
[im 1/10]
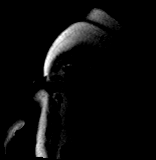

[Series 40: pre short axis · coronal · non-contrast · 8.0mm · 2.25mm/px · 1 of 10 slices shown (4 of 6)]
[im 1/10]
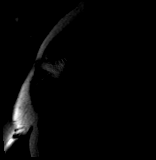

[Series 41: pre short axis · coronal · non-contrast · 8.0mm · 2.25mm/px · 1 of 10 slices shown (5 of 6)]
[im 1/10]
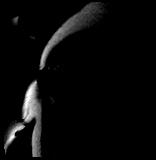

[Series 42: pre short axis · coronal · non-contrast · 8.0mm · 2.25mm/px · 1 of 10 slices shown (6 of 6)]
[im 1/10]
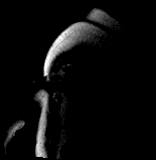

[Series 43: rest short axis · coronal · 8.0mm · 2.38mm/px · 1 of 60 slices shown (1 of 6)]
[im 1/60]
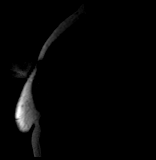

[Series 44: rest short axis · coronal · 8.0mm · 2.38mm/px · 1 of 60 slices shown (2 of 6)]
[im 1/60]
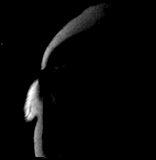

[Series 45: rest short axis · coronal · 8.0mm · 2.38mm/px · 1 of 60 slices shown (3 of 6)]
[im 1/60]
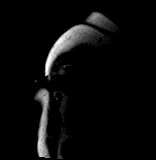

[Series 46: rest short axis · coronal · 8.0mm · 2.38mm/px · 1 of 60 slices shown (4 of 6)]
[im 1/60]
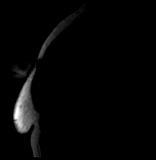

[Series 47: rest short axis · coronal · 8.0mm · 2.38mm/px · 1 of 60 slices shown (5 of 6)]
[im 1/60]
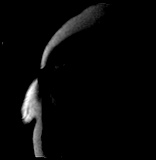

[Series 48: rest short axis · coronal · 8.0mm · 2.38mm/px · 1 of 60 slices shown (6 of 6)]
[im 1/60]
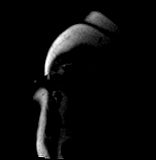

[Series 49: bSSFP · coronal · 6.0mm · 1.41mm/px · 1 of 25 slices shown (20 of 20)]
[im 1/25]
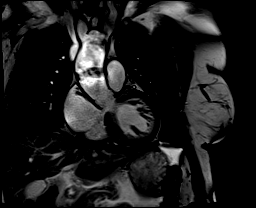

[Series 50: cine rvit · sagittal · 6.0mm · 1.41mm/px · 1 of 25 slices shown]
[im 1/25]
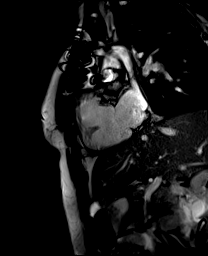

[Series 51: aortic valve cine · oblique · 6.0mm · 1.41mm/px · 1 of 25 slices shown]
[im 1/25]
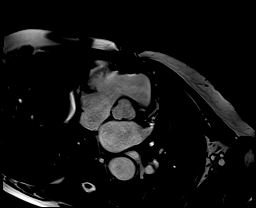

[Series 52: cine rvot · sagittal · 6.0mm · 1.41mm/px · 1 of 25 slices shown]
[im 1/25]
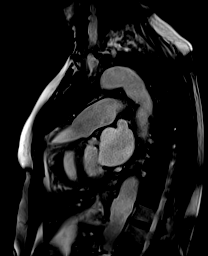

[Series 54: lge_single shot sa · coronal · 8.0mm · 2.08mm/px · 1 of 18 slices shown (1 of 2)]
[im 1/18]
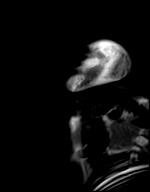

[Series 55: lge_single shot sa · coronal · 8.0mm · 2.08mm/px · 1 of 18 slices shown (2 of 2)]
[im 1/18]
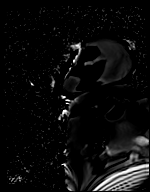

[Series 58: lge_single shot 4 · axial · 6.0mm · 1.98mm/px · 1 of 1 slices shown]
[im 1/1]
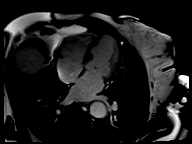

[45 of 48 positions shown; findings below may reference images not displayed]

FINDINGS: 1. Normal left ventricular size, mild proximal septal wall
hypertrophy. Normal systolic function (LVEF = 59%).

There is Moderate basal-mid septal wall hypokinesis.

There is a subendocardial late gadolinium enhancement in the basal
and mid septal wall of the left ventricular myocardium. LGE appears
to be at least 50% in the proximal/basal septum and 75-100% in the
mid septal wall, indicating prior infarct. Left ventricular scar
burden 19.7%.

LVEDV: 177 ml

LVESV: 72 ml

SV: 105 ml

CO: 5.1 L/min

Myocardial mass: 110 g

2. Normal right ventricular size, thickness and systolic function
(RVEF =60%). There are no regional wall motion abnormalities.

3.  Normal left and right atrial size.

4. Normal size of the aortic root, ascending aorta and pulmonary
artery.

5.  No significant valvular abnormalities.

6.  Normal pericardium.  No pericardial effusion.
IMPRESSION: 1. Normal LV global systolic function with LVEF 59%, moderate basal
to mid LV septal wall hypokinesis.

2. Subendocardial late gadolinium enhancement involving 50% basal,
75-100% of the mid LV septal wall, indicating prior infarct. Late
gadolinium enhancement (scar) comprises about 19.7% of LV myocardium

3.  Normal RV size and function, RVEF 60%.

4.  Scar / LGE likely culprit for wide complex tachycardia.

Hiyasettin Danismaz

## 2021-09-01 NOTE — Addendum Note (Signed)
Addended by: Jama Flavors on: 09/01/2021 10:51 AM ? ? Modules accepted: Orders ? ?

## 2021-09-01 NOTE — Patient Instructions (Signed)
Medications: Your physician recommends that you continue on your current medications as directed. Please refer to the Current Medication list given to you today. *If you need a refill on your cardiac medications before your next appointment, please call your pharmacy*  Lab Work: None. If you have labs (blood work) drawn today and your tests are completely normal, you will receive your results only by: MyChart Message (if you have MyChart) OR A paper copy in the mail If you have any lab test that is abnormal or we need to change your treatment, we will call you to review the results.  Testing/Procedures: None.  Follow-Up: At [facility], you and your health needs are our priority.  As part of our continuing mission to provide you with exceptional heart care, we have created designated Provider Care Teams.  These Care Teams include your primary Cardiologist (physician) and Advanced Practice Providers (APPs -  Physician Assistants and Nurse Practitioners) who all work together to provide you with the care you need, when you need it.  Your physician wants you to follow-up in: 12 months with one of the following Advanced Practice Providers on your designated Care Team:    Chris Berge, NP Ryan Dunn PA Cadence Furth PA    You will receive a reminder letter in the mail two months in advance. If you don't receive a letter, please call our office to schedule the follow-up appointment.  We recommend signing up for the patient portal called "MyChart".  Sign up information is provided on this After Visit Summary.  MyChart is used to connect with patients for Virtual Visits (Telemedicine).  Patients are able to view lab/test results, encounter notes, upcoming appointments, etc.  Non-urgent messages can be sent to your provider as well.   To learn more about what you can do with MyChart, go to https://www.mychart.com.    Any Other Special Instructions Will Be Listed Below (If Applicable).  

## 2021-09-06 DIAGNOSIS — R000 Tachycardia, unspecified: Secondary | ICD-10-CM | POA: Diagnosis not present

## 2021-09-06 DIAGNOSIS — I7121 Aneurysm of the ascending aorta, without rupture: Secondary | ICD-10-CM | POA: Diagnosis not present

## 2021-09-06 DIAGNOSIS — I2510 Atherosclerotic heart disease of native coronary artery without angina pectoris: Secondary | ICD-10-CM | POA: Diagnosis not present

## 2021-09-06 DIAGNOSIS — J449 Chronic obstructive pulmonary disease, unspecified: Secondary | ICD-10-CM | POA: Diagnosis not present

## 2021-09-06 DIAGNOSIS — K219 Gastro-esophageal reflux disease without esophagitis: Secondary | ICD-10-CM | POA: Diagnosis not present

## 2021-09-06 DIAGNOSIS — R002 Palpitations: Secondary | ICD-10-CM | POA: Diagnosis not present

## 2021-09-06 DIAGNOSIS — I739 Peripheral vascular disease, unspecified: Secondary | ICD-10-CM | POA: Diagnosis not present

## 2021-09-06 DIAGNOSIS — I10 Essential (primary) hypertension: Secondary | ICD-10-CM | POA: Diagnosis not present

## 2021-09-06 DIAGNOSIS — I209 Angina pectoris, unspecified: Secondary | ICD-10-CM | POA: Diagnosis not present

## 2021-09-06 DIAGNOSIS — Z951 Presence of aortocoronary bypass graft: Secondary | ICD-10-CM | POA: Diagnosis not present

## 2021-09-06 DIAGNOSIS — R001 Bradycardia, unspecified: Secondary | ICD-10-CM | POA: Diagnosis not present

## 2021-09-08 ENCOUNTER — Ambulatory Visit
Admission: RE | Admit: 2021-09-08 | Discharge: 2021-09-08 | Disposition: A | Discharge status: Home / Self Care | Payer: Medicare HMO | Source: Ambulatory Visit | Attending: Internal Medicine | Admitting: Internal Medicine

## 2021-09-08 DIAGNOSIS — I209 Angina pectoris, unspecified: Secondary | ICD-10-CM | POA: Diagnosis not present

## 2021-09-08 DIAGNOSIS — I739 Peripheral vascular disease, unspecified: Secondary | ICD-10-CM | POA: Insufficient documentation

## 2021-09-08 DIAGNOSIS — Z136 Encounter for screening for cardiovascular disorders: Secondary | ICD-10-CM | POA: Diagnosis not present

## 2021-09-08 DIAGNOSIS — I723 Aneurysm of iliac artery: Secondary | ICD-10-CM | POA: Diagnosis not present

## 2021-09-08 DIAGNOSIS — Z87891 Personal history of nicotine dependence: Secondary | ICD-10-CM | POA: Diagnosis not present

## 2021-09-08 DIAGNOSIS — I25118 Atherosclerotic heart disease of native coronary artery with other forms of angina pectoris: Secondary | ICD-10-CM | POA: Insufficient documentation

## 2021-09-08 DIAGNOSIS — I700 Atherosclerosis of aorta: Secondary | ICD-10-CM | POA: Diagnosis not present

## 2021-09-27 ENCOUNTER — Ambulatory Visit: Payer: Self-pay

## 2021-09-27 ENCOUNTER — Telehealth: Payer: Self-pay | Admitting: Family Medicine

## 2021-09-27 NOTE — Telephone Encounter (Signed)
quinapril (ACCUPRIL) 40 MG tablet has been discontinued by pharmacy and they no longer supply this RX/ Pt stated they have an alternative Benazepril at the pharmacy / pt would like refill for Benazepril 40MG  sent to  pharmacy #[id number] [place], [place] - [address] Phone:  [phone]  Fax:  [phone]

## 2021-09-27 NOTE — Telephone Encounter (Signed)
   Chief Complaint: high BP Symptoms: mild headache Frequency: ran out of quinapril Friday and needs benazepril prescription clarified Pertinent Negatives: Patient denies chest pain, vision changes, weakness or difficulty breathing Disposition: [] ED /[] Urgent Care (no appt availability in office) / [x] Appointment(In office/virtual)/ []  [place] [facility]/ [] Home Care/ [] Refused Recommended Disposition /[] [facility]/ []  Follow-up with PCP Additional Notes: pt stated pharmacist needs clarification regarding benazepril prescription. Does not have quinapril.       Reason for Disposition  Ran out of BP medications  Answer Assessment - Initial Assessment Questions 1. BLOOD PRESSURE: "What is the blood pressure?" "Did you take at least two measurements 5 minutes apart?"     1830 184/67 0730 172/68 1000 175/70 2. ONSET: "When did you take your blood pressure?"     See above 3. HOW: "How did you obtain the blood pressure?" (e.g., visiting nurse, automatic home BP monitor)     Auto. Arm monitor 4. HISTORY: "Do you have a history of high blood pressure?"     yes 5. MEDICATIONS: "Are you taking any medications for blood pressure?" "Have you missed any doses recently?"     Yes- quinapril out since Friday am  6. OTHER SYMPTOMS: "Do you have any symptoms?" (e.g., headache, chest pain, blurred vision, difficulty breathing, weakness)     Headache (mild)  Protocols used: Blood Pressure - High-A-AH

## 2021-09-28 ENCOUNTER — Encounter: Payer: Self-pay | Admitting: Family Medicine

## 2021-09-28 ENCOUNTER — Ambulatory Visit (INDEPENDENT_AMBULATORY_CARE_PROVIDER_SITE_OTHER): Payer: Medicare HMO | Admitting: Family Medicine

## 2021-09-28 VITALS — BP 142/58 | HR 48 | Resp 16 | Ht 67.0 in | Wt 187.0 lb

## 2021-09-28 DIAGNOSIS — I2510 Atherosclerotic heart disease of native coronary artery without angina pectoris: Secondary | ICD-10-CM | POA: Diagnosis not present

## 2021-09-28 DIAGNOSIS — E7800 Pure hypercholesterolemia, unspecified: Secondary | ICD-10-CM | POA: Diagnosis not present

## 2021-09-28 DIAGNOSIS — I10 Essential (primary) hypertension: Secondary | ICD-10-CM | POA: Diagnosis not present

## 2021-09-28 DIAGNOSIS — I739 Peripheral vascular disease, unspecified: Secondary | ICD-10-CM | POA: Diagnosis not present

## 2021-09-28 DIAGNOSIS — Z0000 Encounter for general adult medical examination without abnormal findings: Secondary | ICD-10-CM

## 2021-09-28 DIAGNOSIS — M25511 Pain in right shoulder: Secondary | ICD-10-CM

## 2021-09-28 MED ORDER — BENAZEPRIL HCL 20 MG PO TABS: 20.0000 mg | ORAL_TABLET | Freq: Every day | ORAL | 3 refills | Status: DC

## 2021-09-28 MED ORDER — PREDNISONE 20 MG PO TABS: 20.0000 mg | ORAL_TABLET | Freq: Every day | ORAL | 0 refills | Status: DC

## 2021-09-28 NOTE — Progress Notes (Signed)
Annual Wellness Visit  I,Penny Reed,acting as a scribe for [name], MD.,have documented all relevant documentation on the behalf of [name], MD,as directed by  [name], MD while in the presence of [name], MD.     Patient: Penny Reed, Female    DOB: 12-28-1945, 76 y.o.   MRN: [id number] Visit Date: 09/28/2021  Today's Provider: [name], MD   Chief Complaint  Patient presents with   Medicare Wellness   Subjective    Penny Reed is a 76 y.o. female who presents today for her Annual Wellness Visit.Annual physical. She reports consuming a general diet. Home exercise routine includes treadmill and GYM. She generally feels fairly well. She reports sleeping fairly well. She does not have additional problems to discuss today.   HPI    Medications: Outpatient Medications Prior to Visit  Medication Sig   albuterol (PROAIR HFA) 108 (90 BASE) MCG/ACT inhaler Inhale 2 puffs into the lungs every 6 (six) hours as needed for wheezing or shortness of breath.   amLODipine (NORVASC) 5 MG tablet TAKE 1 TABLET BY MOUTH EVERY DAY   atorvastatin (LIPITOR) 80 MG tablet TAKE 1 TABLET BY MOUTH EVERY DAY   Azelastine HCl 137 MCG/SPRAY SOLN SPRAY 2 SPRAYS INTO EACH NOSTRIL EVERY DAY   Calcium Carbonate (CALCIUM 600 PO) Take 600 mg by mouth in the morning and at bedtime.   cetirizine (ZYRTEC) 10 MG tablet Take 10 mg by mouth daily as needed for allergies.   cholecalciferol (VITAMIN D3) 25 MCG (1000 UNIT) tablet Take 1,000 Units by mouth daily.   furosemide (LASIX) 40 MG tablet Take 1 tablet (40 mg total) by mouth daily as needed (swelling/weight gain).   levothyroxine (SYNTHROID) 112 MCG tablet TAKE 1 TABLET BY MOUTH SIX DAYS PER WEEK. TAKE 1 AND 1/2 TABLET BY MOUTH ONE DAY PER WEEK. (Patient taking differently: TAKE 1 TABLET BY MOUTH SIX DAYS PER WEEK. TAKE 1 AND 1/2 TABLET BY MOUTH ONE DAY PER WEEK [facility]))   Multiple Vitamin  (MULTIVITAMIN) tablet Take 1 tablet by mouth daily. Standard Process   nitroGLYCERIN (NITROSTAT) 0.4 MG SL tablet Place 0.4 mg under the tongue every 5 (five) minutes as needed for chest pain.    Omega-3 Fatty Acids (FISH OIL) 1000 MG CAPS Take 3,000 mg by mouth at bedtime.   rivaroxaban (XARELTO) 20 MG TABS tablet TAKE 1 TABLET BY MOUTH DAILY WITH SUPPER. STOP ELIQUIS   umeclidinium-vilanterol (ANORO ELLIPTA) 62.5-25 MCG/ACT AEPB INHALE 1 PUFF BY MOUTH EVERY DAY   zolpidem (AMBIEN) 5 MG tablet TAKE 1 TABLET BY MOUTH EVERY DAY AT BEDTIME AS NEEDED FOR SLEEP   [DISCONTINUED] quinapril (ACCUPRIL) 40 MG tablet Take 1 tablet (40 mg total) by mouth daily.   No facility-administered medications prior to visit.    Allergies  Allergen Reactions   Adhesive [Tape]     Band-aids irritate skin   Azithromycin Diarrhea and Nausea And Vomiting   Eliquis [Apixaban] Swelling    Leg and ankle swelling    Patient Care Team: Maple Hudson., MD as PCP - General (Family Medicine) Lanier Prude, MD as PCP - Electrophysiology (Cardiology) Domingo Madeira, OD as Consulting Physician (Optometry) Alwyn Pea, MD as Consulting Physician (Cardiology) Tedd Sias Marlana Salvage, MD as Physician Assistant (Endocrinology) Lawrence Marseilles, DMD (Dentistry) Gaspar Cola, [facility] (Pharmacist)  Review of Systems  Respiratory:  Positive for apnea and shortness of breath.   Musculoskeletal:  Positive for arthralgias and myalgias.  All other systems reviewed and are negative.  Last lipids Lab Results  Component Value Date   CHOL 172 03/23/2021   HDL 71 03/23/2021   LDLCALC 88 03/23/2021   TRIG 69 03/23/2021   CHOLHDL 2.4 03/23/2021        Objective    Vitals: BP (!) 142/58 (BP Location: Right Arm, Patient Position: Sitting, Cuff Size: Normal)   Pulse (!) 48   Resp 16   Ht  (1.702 m)   Wt 187 lb (84.8 kg)   SpO2 98%   BMI 29.29 kg/m  BP Readings from Last 3 Encounters:  09/28/21 (!)  142/58  09/01/21 (!) 114/58  08/09/21 (!) 130/50   Wt Readings from Last 3 Encounters:  09/28/21 187 lb (84.8 kg)  09/01/21 187 lb (84.8 kg)  08/09/21 186 lb 6.4 oz (84.6 kg)       Physical Exam Vitals reviewed.  Constitutional:      Appearance: She is well-developed.  HENT:     Head: Normocephalic and atraumatic.  Eyes:     General: No scleral icterus.    Conjunctiva/sclera: Conjunctivae normal.  Neck:     Thyroid: No thyromegaly.  Cardiovascular:     Rate and Rhythm: Normal rate and regular rhythm.     Heart sounds: Normal heart sounds.  Pulmonary:     Effort: Pulmonary effort is normal.     Breath sounds: Normal breath sounds.  Abdominal:     Palpations: Abdomen is soft.  Skin:    General: Skin is warm and dry.  Neurological:     General: No focal deficit present.     Mental Status: She is alert and oriented to person, place, and time.  Psychiatric:        Mood and Affect: Mood normal.        Behavior: Behavior normal.        Thought Content: Thought content normal.        Judgment: Judgment normal.      Most recent functional status assessment:    09/28/2021   10:09 AM  In your present state of health, do you have any difficulty performing the following activities:  Hearing? 1  Vision? 1  Difficulty concentrating or making decisions? 0  Walking or climbing stairs? 1  Dressing or bathing? 0  Doing errands, shopping? 0   Most recent fall risk assessment:    09/28/2021   10:08 AM  Fall Risk   Falls in the past year? 0  Number falls in past yr: 0  Injury with Fall? 0  Risk for fall due to : No Fall Risks  Follow up Falls evaluation completed    Most recent depression screenings:    09/28/2021   10:08 AM 03/23/2021    8:26 AM  PHQ 2/9 Scores  PHQ - 2 Score 0 0  PHQ- 9 Score 2 3   Most recent cognitive screening:    09/04/2019    2:17 PM  6CIT Screen  What Year? 0 points  What month? 0 points  What time? 0 points  Count back from 20 0 points   Months in reverse 0 points  Repeat phrase 0 points  Total Score 0 points   Most recent Audit-C alcohol use screening    09/28/2021   10:08 AM  Alcohol Use Disorder Test (AUDIT)  1. How often do you have a drink containing alcohol? 3  2. How many drinks containing alcohol do you  have on a typical day when you are drinking? 1  3. How often do you have six or more drinks on one occasion? 1  AUDIT-C Score 5  4. How often during the last year have you found that you were not able to stop drinking once you had started? 0  5. How often during the last year have you failed to do what was normally expected from you because of drinking? 0  6. How often during the last year have you needed a first drink in the morning to get yourself going after a heavy drinking session? 0  7. How often during the last year have you had a feeling of guilt of remorse after drinking? 0  8. How often during the last year have you been unable to remember what happened the night before because you had been drinking? 0  9. Have you or someone else been injured as a result of your drinking? 0  10. Has a relative or friend or a doctor or another health worker been concerned about your drinking or suggested you cut down? 0  Alcohol Use Disorder Identification Test Final Score (AUDIT) 5   A score of 3 or more in women, and 4 or more in men indicates increased risk for alcohol abuse, EXCEPT if all of the points are from question 1   No results found for any visits on 09/28/21.  Assessment & Plan     Annual wellness visit done today including the all of the following: Reviewed patient's Family Medical History Reviewed and updated list of patient's medical providers Assessment of cognitive impairment was done Assessed patient's functional ability Established a written schedule for health screening services Health Risk Assessent Completed and Reviewed  Exercise Activities and Dietary recommendations  Goals      Cut out  extra servings     Recommend to continue with weight watchers diet and cutting out carbohydrates.       Track and Manage My Symptoms-COPD     Timeframe:  Long-Range Goal Priority:  High Start Date: 12/15/20                            Expected End Date: 12/15/2021                      Follow Up within 90 days   - begin a symptom diary - develop a rescue plan - eliminate symptom triggers at home - follow rescue plan if symptoms flare-up    Why is this important?   Tracking your symptoms and other information about your health helps your doctor plan your care.  Write down the symptoms, the time of day, what you were doing and what medicine you are taking.  You will soon learn how to manage your symptoms.     Notes:         Immunization History  Administered Date(s) Administered   Fluad Quad(high Dose 65+) 01/11/2019, 03/23/2021   Influenza, High Dose Seasonal PF 02/23/2017, 01/02/2018, 01/17/2020   Influenza,inj,Quad PF,6+ Mos 03/20/2013   Influenza-Unspecified 12/24/2013, 12/24/2013, 01/24/2015   Moderna Covid-19 Vaccine Bivalent Booster 411yrs & up 05/04/2021   PFIZER(Purple Top)SARS-COV-2 Vaccination 06/18/2019, 07/09/2019   Pneumococcal Conjugate-13 03/20/2013   Pneumococcal Polysaccharide-23 04/02/2014   Pneumococcal-Unspecified 03/24/2013   Td 07/10/2003   Tdap 01/02/2018    Health Maintenance  Topic Date Due   Zoster Vaccines- Shingrix (1 of 2) Never done  INFLUENZA VACCINE  11/23/2021   MAMMOGRAM  11/04/2022   TETANUS/TDAP  01/03/2028   COLONOSCOPY (Pts 45-58yrs Insurance coverage will need to be confirmed)  12/03/2029   Pneumonia Vaccine 74+ Years old  Completed   DEXA SCAN  Completed   COVID-19 Vaccine  Completed   Hepatitis C Screening  Completed   HPV VACCINES  Aged Out     Discussed health benefits of physical activity, and encouraged her to engage in regular exercise appropriate for her age and condition.    1. Encounter for Medicare annual  wellness exam   2. Annual physical exam   3. Acute pain of right shoulder May need referral to orthopedics. - predniSONE (DELTASONE) 20 MG tablet; Take 1 tablet (20 mg total) by mouth daily with breakfast.  Dispense: 5 tablet; Refill: 0  4. Claudication of both lower extremities (HCC) Like your symptoms are most consistent with claudication. Like vascular referral in the face of other ASCVD is appropriate. - Ambulatory referral to Vascular Surgery  5. Arteriosclerosis of coronary artery All risk factors treated by cardiology  6. Essential hypertension Good control  7. Pure hypercholesterolemia On statin   No follow-ups on file.     I, [name], MD, have reviewed all documentation for this visit. The documentation on 10/05/21 for the exam, diagnosis, procedures, and orders are all accurate and complete.    Breyon Blass Wendelyn Breslow, MD  [facility] [phone] (phone) [phone] (fax)  [facility]

## 2021-10-04 ENCOUNTER — Other Ambulatory Visit: Payer: Self-pay | Admitting: Family Medicine

## 2021-10-04 DIAGNOSIS — Z1231 Encounter for screening mammogram for malignant neoplasm of breast: Secondary | ICD-10-CM

## 2021-10-04 NOTE — Telephone Encounter (Signed)
Patient states she received an rx for benazepril 40 mg qd. Her bp has leveled out , so she wants to remain on benazepril for now.

## 2021-10-05 ENCOUNTER — Ambulatory Visit (INDEPENDENT_AMBULATORY_CARE_PROVIDER_SITE_OTHER): Payer: Medicare HMO

## 2021-10-05 DIAGNOSIS — J449 Chronic obstructive pulmonary disease, unspecified: Secondary | ICD-10-CM

## 2021-10-05 DIAGNOSIS — I4819 Other persistent atrial fibrillation: Secondary | ICD-10-CM

## 2021-10-05 DIAGNOSIS — I10 Essential (primary) hypertension: Secondary | ICD-10-CM

## 2021-10-05 NOTE — Patient Instructions (Signed)
Visit Information It was great speaking with you today!  Please let me know if you have any questions about our visit.   Goals Addressed             This Visit's Progress    Track and Manage My Symptoms-COPD   On track    Timeframe:  Long-Range Goal Priority:  High Start Date: 12/15/20                            Expected End Date: 12/15/2021                      Follow Up within 90 days   - begin a symptom diary - develop a rescue plan - eliminate symptom triggers at home - follow rescue plan if symptoms flare-up    Why is this important?   Tracking your symptoms and other information about your health helps your doctor plan your care.  Write down the symptoms, the time of day, what you were doing and what medicine you are taking.  You will soon learn how to manage your symptoms.     Notes:         Patient Care Plan: General Pharmacy (Adult)     Problem Identified: Hypertension, Hyperlipidemia, Atrial Fibrillation, Coronary Artery Disease, GERD, COPD, Hypothyroidism, Allergic Rhinitis, and Insomnia   Priority: High     Long-Range Goal: Patient-Specific Goal   Start Date: 12/15/2020  Expected End Date: 12/15/2021  This Visit's Progress: On track  Recent Progress: On track  Priority: High  Note:   Current Barriers:  Unable to independently afford treatment regimen  Pharmacist Clinical Goal(s):  Patient will verbalize ability to afford treatment regimen through collaboration with PharmD and provider.   Interventions: 1:1 collaboration with Maple Hudson., MD regarding development and update of comprehensive plan of care as evidenced by provider attestation and co-signature Inter-disciplinary care team collaboration (see longitudinal plan of care) Comprehensive medication review performed; medication list updated in electronic medical record  Hypertension (BP goal <140/90) -Controlled -Current treatment: Amlodipine 5 mg daily  Furosemide 40 mg 1/2 tablet  daily as needed  Benazepril 20 mg daily  -Medications previously tried: Quinapril (discontinued) -Current home readings: 140/70s - 150/80s  -Reports hypertensive symptoms: dull headache that comes and goes.  -Home blood pressures mildly elevated, but patient has only taken benazepril for ~1 week so full blood pressure effect not yet seen.  -Recommended to continue current medication -Recheck BP in 2-3 weeks to re-assess benazepril dose.   Hyperlipidemia: (LDL goal < 70) -Controlled -Current treatment: Atorvastatin 80 mg daily  -Medications previously tried: NA  -Recommended to continue current medication  Atrial Flutter (Goal: prevent stroke and major bleeding) -Controlled -CHADSVASC: 4 -Current treatment: Rate control: None Anticoagulation: Xarelto 20 mg daily -Medications previously tried: Eliquis (Swelling) -Denies symptoms of A-Fib.  -Recommended to continue current medication  COPD (Goal: control symptoms and prevent exacerbations) -Controlled -Current treatment  Albuterol HFA 2 puffs every 6 hours as needed Anoro 1 puff daily  -Medications previously tried: NA -Exacerbations requiring treatment in last 6 months: None -Uses elliptical 6x weekly for an hour. Reports wheezing when climbing her stairs.  -Patient reports consistent use of maintenance inhaler -Frequency of rescue inhaler use: sporadic -Recommend referral to recheck PFTs at next PCP follow-up.    Patient Goals/Self-Care Activities Patient will:  - check blood pressure weekly, document, and provide at future appointments  Follow  Up Plan: Telephone follow up appointment with care management team member scheduled for:  04/04/2022 at 11:00 AM    Patient agreed to services and verbal consent obtained.   Patient verbalizes understanding of instructions and care plan provided today and agrees to view in MyChart. Active MyChart status and patient understanding of how to access instructions and care plan via  MyChart confirmed with patient.     [name], PharmD, Patsy Baltimore, CPP  Clinical Pharmacist Practitioner  [facility] [phone]

## 2021-10-05 NOTE — Progress Notes (Signed)
Chronic Care Management Pharmacy Note  10/05/2021 Name:  Penny Reed MRN:  [id number] DOB:  [birth year]  Summary: Patient presents for CCM follow-up.   -Home blood pressures mildly elevated, but patient has only taken benazepril for ~1 week so full blood pressure effect not yet seen.   -Reports wheezing when climbing her stairs.   Recommendations/Changes made from today's visit: Continue Current Medications -Recommend referral to recheck PFTs at next PCP follow-up.    Plan: HC BP assessment in 2-3 weeks  CPP follow-up 6 months  Subjective: Penny Reed is an 76 y.o. year old female who is a primary patient of Jerrol Banana., MD.  The CCM team was consulted for assistance with disease management and care coordination needs.    Engaged with patient by telephone for follow up visit in response to provider referral for pharmacy case management and/or care coordination services.   Consent to Services:  The patient was given information about Chronic Care Management services, agreed to services, and gave verbal consent prior to initiation of services.  Please see initial visit note for detailed documentation.   Patient Care Team: Jerrol Banana., MD as PCP - General (Family Medicine) Vickie Epley, MD as PCP - Electrophysiology (Cardiology) Idelle Leech, OD as Consulting Physician (Optometry) Yolonda Kida, MD as Consulting Physician (Cardiology) Gabriel Carina Betsey Holiday, MD as Physician Assistant (Endocrinology) Vickii Penna, DMD (Dentistry) Germaine Pomfret, [facility] (Pharmacist)  Recent office visits: 09/28/21: Patient presented to Dr. Rosanna Randy for follow-up. STOP quinapril, start benazepril 20 mg, prednisone 20 mg daily.   Recent consult visits: 09/06/21: Patient presented to Dr. Clayborn Bigness (Cardiology) for follow-up. No medication changes made.  09/01/21: Patient presented to Dr. Quentin Ore (Cardiology) for follow-up. No medication changes made.  08/10/21:  Patient presented to Dr. Clayborn Bigness (Cardiology) for follow-up.  Hospital visits: 09/04/20: Patient hospitalzied for SVT ablation. Patient with a-flutter, started on Eliquis. Switched to Xarelto after allergy.    Objective:  Lab Results  Component Value Date   CREATININE 1.05 (H) 07/28/2021   BUN 24 (H) 07/28/2021   GFRNONAA 56 (L) 07/28/2021   GFRAA 81 05/29/2020   NA 138 07/28/2021   K 3.9 07/28/2021   CALCIUM 9.6 07/28/2021   CO2 24 07/28/2021   GLUCOSE 126 (H) 07/28/2021    Lab Results  Component Value Date/Time   HGBA1C 5.8 (H) 03/23/2021 09:00 AM    Last diabetic Eye exam: No results found for: "HMDIABEYEEXA"  Last diabetic Foot exam: No results found for: "HMDIABFOOTEX"   Lab Results  Component Value Date   CHOL 172 03/23/2021   HDL 71 03/23/2021   LDLCALC 88 03/23/2021   TRIG 69 03/23/2021   CHOLHDL 2.4 03/23/2021       Latest Ref Rng & Units 03/23/2021    9:00 AM 09/05/2018   11:49 AM 08/28/2017   12:23 PM  Hepatic Function  Total Protein 6.0 - 8.5 g/dL 7.0  7.1  7.1   Albumin 3.7 - 4.7 g/dL 4.7  4.5  4.5   AST 0 - 40 IU/L 29  38  27   ALT 0 - 32 IU/L 23   20   Alk Phosphatase 44 - 121 IU/L 92  77  90   Total Bilirubin 0.0 - 1.2 mg/dL 0.8  1.0  0.6     Lab Results  Component Value Date/Time   TSH 2.310 03/23/2021 09:00 AM   TSH 5.740 (H) 09/05/2018 11:49 AM  Latest Ref Rng & Units 07/28/2021    8:43 AM 05/11/2021    9:59 AM 03/23/2021    9:00 AM  CBC  WBC 4.0 - 10.5 K/uL 5.1  5.8  4.5   Hemoglobin 12.0 - 15.0 g/dL 13.2  13.7  13.5   Hematocrit 36.0 - 46.0 % 39.5  40.3  39.7   Platelets 150 - 400 K/uL 159  216  203     No results found for: "VD25OH"  Clinical ASCVD: Yes  The ASCVD Risk score (Arnett DK, et al., 2019) failed to calculate for the following reasons:   The patient has a prior MI or stroke diagnosis       09/28/2021   10:08 AM 03/23/2021    8:26 AM 09/16/2020    9:22 AM  Depression screen PHQ 2/9  Decreased Interest 0 0 0   Down, Depressed, Hopeless 0 0 0  PHQ - 2 Score 0 0 0  Altered sleeping 0 2 0  Tired, decreased energy 1 1 0  Change in appetite 1 0 0  Feeling bad or failure about yourself  0 0 0  Trouble concentrating 0 0 0  Moving slowly or fidgety/restless 0 0 0  Suicidal thoughts 0 0 0  PHQ-9 Score 2 3 0  Difficult doing work/chores Not difficult at all Not difficult at all Not difficult at all    Social History   Tobacco Use  Smoking Status Former   Packs/day: 0.25   Years: 25.00   Total pack years: 6.25   Types: Cigarettes   Quit date: 04/25/2005   Years since quitting: 16.4  Smokeless Tobacco Never  Tobacco Comments   Former smoker 06/28/21   BP Readings from Last 3 Encounters:  09/28/21 (!) 142/58  09/01/21 (!) 114/58  08/09/21 (!) 130/50   Pulse Readings from Last 3 Encounters:  09/28/21 (!) 48  09/01/21 (!) 49  08/09/21 (!) 45   Wt Readings from Last 3 Encounters:  09/28/21 187 lb (84.8 kg)  09/01/21 187 lb (84.8 kg)  08/09/21 186 lb 6.4 oz (84.6 kg)   BMI Readings from Last 3 Encounters:  09/28/21 29.29 kg/m  09/01/21 29.29 kg/m  08/09/21 29.19 kg/m    Assessment/Interventions: Review of patient past medical history, allergies, medications, health status, including review of consultants reports, laboratory and other test data, was performed as part of comprehensive evaluation and provision of chronic care management services.   SDOH:  (Social Determinants of Health) assessments and interventions performed: Yes    SDOH Screenings   Alcohol Screen: Low Risk  (09/28/2021)   Alcohol Screen    Last Alcohol Screening Score (AUDIT): 5  Depression (PHQ2-9): Low Risk  (09/28/2021)   Depression (PHQ2-9)    PHQ-2 Score: 2  Financial Resource Strain: Low Risk  (03/16/2021)   Overall Financial Resource Strain (CARDIA)    Difficulty of Paying Living Expenses: Not hard at all  Food Insecurity: No Food Insecurity (09/04/2019)   Hunger Vital Sign    Worried About Running Out  of Food in the Last Year: Never true    Ran Out of Food in the Last Year: Never true  Housing: Low Risk  (09/04/2019)   Housing    Last Housing Risk Score: 0  Physical Activity: Sufficiently Active (09/04/2019)   Exercise Vital Sign    Days of Exercise per Week: 6 days    Minutes of Exercise per Session: 60 min  Social Connections: Moderately Integrated (09/04/2019)   Social Connection and Isolation  Panel [NHANES]    Frequency of Communication with Friends and Family: More than three times a week    Frequency of Social Gatherings with Friends and Family: Three times a week    Attends Religious Services: Never    Active Member of Clubs or Organizations: Yes    Attends Archivist Meetings: More than 4 times per year    Marital Status: Married  Stress: No Stress Concern Present (09/04/2019)   [place]    Feeling of Stress : Not at all  Tobacco Use: Medium Risk (09/28/2021)   Patient History    Smoking Tobacco Use: Former    Smokeless Tobacco Use: Never    Passive Exposure: Not on file  Transportation Needs: No Transportation Needs (04/27/2020)   PRAPARE - Hydrologist (Medical): No    Lack of Transportation (Non-Medical): No    CCM Care Plan  Allergies  Allergen Reactions   Adhesive [Tape]     Band-aids irritate skin   Azithromycin Diarrhea and Nausea And Vomiting   Eliquis [Apixaban] Swelling    Leg and ankle swelling    Medications Reviewed Today     Reviewed by Julieta Bellini, CMA (Certified Medical Assistant) on 09/28/21 at [place] List Status: <None>   Medication Order Taking? Sig Documenting Provider Last Dose Status Informant  albuterol (PROAIR HFA) 108 (90 BASE) MCG/ACT inhaler [id number] Yes Inhale 2 puffs into the lungs every 6 (six) hours as needed for wheezing or shortness of breath. Jerrol Banana., MD Taking Active Self  amLODipine [facility]) 5 MG tablet  [id number] Yes TAKE 1 TABLET BY MOUTH EVERY DAY Jerrol Banana., MD Taking Active Self  atorvastatin (LIPITOR) 80 MG tablet [id number] Yes TAKE 1 TABLET BY MOUTH EVERY DAY Jerrol Banana., MD Taking Active Self  Azelastine HCl 137 MCG/SPRAY SOLN [id number] Yes SPRAY 2 SPRAYS INTO EACH NOSTRIL EVERY DAY Jerrol Banana., MD Taking Active Self  Calcium Carbonate (CALCIUM 600 PO) [id number] Yes Take 600 mg by mouth in the morning and at bedtime. [provider] Taking Active Self  cetirizine (ZYRTEC) 10 MG tablet [id number] Yes Take 10 mg by mouth daily as needed for allergies. [provider] Taking Active Self  cholecalciferol (VITAMIN D3) 25 MCG (1000 UNIT) tablet [id number] Yes Take 1,000 Units by mouth daily. [provider] Taking Active Self  furosemide (LASIX) 40 MG tablet [id number] Yes Take 1 tablet (40 mg total) by mouth daily as needed (swelling/weight gain). Fenton, Bowman R, PA Taking Active Self  levothyroxine (SYNTHROID) 112 MCG tablet [id number] Yes TAKE 1 TABLET BY MOUTH SIX DAYS PER WEEK. TAKE 1 AND 1/2 TABLET BY MOUTH ONE DAY PER WEEK.  Patient taking differently: TAKE 1 TABLET BY MOUTH SIX DAYS PER WEEK. TAKE 1 AND 1/2 TABLET BY MOUTH ONE DAY PER WEEK (WEDNESDAYS)   Jerrol Banana., MD Taking Active Self  Multiple Vitamin (MULTIVITAMIN) tablet [id number] Yes Take 1 tablet by mouth daily. Standard Process [provider] Taking Active Self  nitroGLYCERIN (NITROSTAT) 0.4 MG SL tablet [id number] Yes Place 0.4 mg under the tongue every 5 (five) minutes as needed for chest pain.  [provider] Taking Active Self  Omega-3 Fatty Acids (FISH OIL) 1000 MG CAPS [id number] Yes Take 3,000 mg by mouth at bedtime. [provider] Taking Active Self  rivaroxaban (XARELTO) 20 MG TABS tablet [id number] Yes TAKE 1 TABLET BY  MOUTH DAILY WITH SUPPER. STOP ELIQUIS Fenton, Clint R, PA Taking Active Self  umeclidinium-vilanterol (ANORO  ELLIPTA) 62.5-25 MCG/ACT AEPB [id number] Yes INHALE 1 PUFF BY MOUTH EVERY DAY Birdie Sons, MD Taking Active Self  zolpidem (AMBIEN) 5 MG tablet [id number] Yes TAKE 1 TABLET BY MOUTH EVERY DAY AT BEDTIME AS NEEDED FOR SLEEP Jerrol Banana., MD Taking Active Self            Patient Active Problem List   Diagnosis Date Noted   Persistent atrial fibrillation ([place]) 07/09/2021   Secondary hypercoagulable state ([name]) 06/28/2021   Atrial flutter ([place]) 03/24/2021   Ascending aortic aneurysm ([place]) 07/23/2019   Insomnia 01/26/2018   Angina pectoris ([place]) 12/09/2014   BP (high blood pressure) 12/09/2014   Adiposity 12/09/2014   Dyspnea 09/25/2014   Abnormal respiratory rate 09/25/2014   Acquired hypothyroidism 09/21/2014   Allergic rhinitis 09/21/2014   Arteriosclerosis of coronary artery 09/21/2014   Essential (primary) hypertension 09/21/2014   HLD (hyperlipidemia) 09/21/2014   Cardiomegaly 09/21/2014   Familial multiple lipoprotein-type hyperlipidemia 09/21/2014   CAFL (chronic airflow limitation) (HCC) 08/06/2013   Acid reflux 08/06/2013   Breath shortness 08/06/2013   Disease of thyroid gland 08/06/2013   COPD, moderate ([place]) 05/24/2013    Immunization History  Administered Date(s) Administered   Fluad Quad(high Dose 65+) 01/11/2019, 03/23/2021   Influenza, High Dose Seasonal PF 02/23/2017, 01/02/2018, 01/17/2020   Influenza,inj,Quad PF,6+ Mos 03/20/2013   Influenza-Unspecified 12/24/2013, 12/24/2013, 01/24/2015   Moderna Covid-19 Vaccine Bivalent Booster 59yr & up 05/04/2021   PFIZER(Purple Top)SARS-COV-2 Vaccination 06/18/2019, 07/09/2019   Pneumococcal Conjugate-13 03/20/2013   Pneumococcal Polysaccharide-23 04/02/2014   Pneumococcal-Unspecified 03/24/2013   Td 07/10/2003   Tdap 01/02/2018    Conditions to be addressed/monitored:  Hypertension, Hyperlipidemia, Atrial Fibrillation, Coronary Artery Disease, GERD, COPD, Hypothyroidism, Allergic Rhinitis, and  Insomnia  Care Plan : General Pharmacy (Adult)  Updates made by FGermaine Pomfret RPH since 10/05/2021 12:00 AM     Problem: Hypertension, Hyperlipidemia, Atrial Fibrillation, Coronary Artery Disease, GERD, COPD, Hypothyroidism, Allergic Rhinitis, and Insomnia   Priority: High     Long-Range Goal: Patient-Specific Goal   Start Date: 12/15/2020  Expected End Date: 12/15/2021  This Visit's Progress: On track  Recent Progress: On track  Priority: High  Note:   Current Barriers:  Unable to independently afford treatment regimen  Pharmacist Clinical Goal(s):  Patient will verbalize ability to afford treatment regimen through collaboration with PharmD and provider.   Interventions: 1:1 collaboration with GJerrol Banana, MD regarding development and update of comprehensive plan of care as evidenced by provider attestation and co-signature Inter-disciplinary care team collaboration (see longitudinal plan of care) Comprehensive medication review performed; medication list updated in electronic medical record  Hypertension (BP goal <140/90) -Controlled -Current treatment: Amlodipine 5 mg daily  Furosemide 40 mg 1/2 tablet daily as needed  Benazepril 20 mg daily  -Medications previously tried: Quinapril (discontinued) -Current home readings: 140/70s - 150/80s  -Reports hypertensive symptoms: dull headache that comes and goes.  -Home blood pressures mildly elevated, but patient has only taken benazepril for ~1 week so full blood pressure effect not yet seen.  -Recommended to continue current medication -Recheck BP in 2-3 weeks to re-assess benazepril dose.   Hyperlipidemia: (LDL goal < 70) -Controlled -Current treatment: Atorvastatin 80 mg daily  -Medications previously tried: NA  -Recommended to continue current medication  Atrial Flutter (Goal: prevent stroke and major bleeding) -Controlled -CHADSVASC: 4 -Current treatment: Rate control: None Anticoagulation:  Xarelto  20 mg daily -Medications previously tried: Eliquis (Swelling) -Denies symptoms of A-Fib.  -Recommended to continue current medication  COPD (Goal: control symptoms and prevent exacerbations) -Controlled -Current treatment  Albuterol HFA 2 puffs every 6 hours as needed Anoro 1 puff daily  -Medications previously tried: NA -Exacerbations requiring treatment in last 6 months: None -Uses elliptical 6x weekly for an hour. Reports wheezing when climbing her stairs.  -Patient reports consistent use of maintenance inhaler -Frequency of rescue inhaler use: sporadic -Recommend referral to recheck PFTs at next PCP follow-up.    Patient Goals/Self-Care Activities Patient will:  - check blood pressure weekly, document, and provide at future appointments  Follow Up Plan: Telephone follow up appointment with care management team member scheduled for:  04/04/2022 at 11:00 AM    Medication Assistance: Application for Anoro Ellipta  medication assistance program. in process.  Anticipated assistance start date TBD.  See plan of care for additional detail.  Compliance/Adherence/Medication fill history: Care Gaps: Shingrix Covid Vaccine Influenza   Star-Rating Drugs: Atorvastatin 80 mg: Last filled 09/02/20 for 90-DS  Patient's preferred pharmacy is:  [facility] [address], [place] [address] Phone: [phone] Fax: [place], [address] [place] [address] Phone: [phone] Fax: [phone]  [id number] [place], [place] [address] [place]: [phone]: [phone] [id number] [place], [id number]: [phone]: [phone] Uses pill box? Yes Pt endorses 100% compliance  We discussed:  Current pharmacy is preferred with insurance plan and patient is satisfied with pharmacy services Patient decided to: Continue current medication management strategy  Care Plan and Follow Up Patient Decision:  Patient agrees to Care Plan and Follow-up.  Plan: Telephone follow up appointment with care management team member scheduled for:  04/04/2022 at 11:00 AM  AlJunius ArgylePharmD, BCPara [phone]

## 2021-10-15 ENCOUNTER — Other Ambulatory Visit: Payer: Self-pay | Admitting: Family Medicine

## 2021-10-22 DIAGNOSIS — E785 Hyperlipidemia, unspecified: Secondary | ICD-10-CM

## 2021-10-22 DIAGNOSIS — I10 Essential (primary) hypertension: Secondary | ICD-10-CM | POA: Diagnosis not present

## 2021-10-22 DIAGNOSIS — J449 Chronic obstructive pulmonary disease, unspecified: Secondary | ICD-10-CM | POA: Diagnosis not present

## 2021-10-22 DIAGNOSIS — I4892 Unspecified atrial flutter: Secondary | ICD-10-CM

## 2021-11-04 ENCOUNTER — Ambulatory Visit
Admission: RE | Admit: 2021-11-04 | Discharge: 2021-11-04 | Disposition: A | Discharge status: Home / Self Care | Payer: Medicare HMO | Source: Ambulatory Visit | Attending: Family Medicine | Admitting: Family Medicine

## 2021-11-04 DIAGNOSIS — Z1231 Encounter for screening mammogram for malignant neoplasm of breast: Secondary | ICD-10-CM

## 2021-11-21 ENCOUNTER — Other Ambulatory Visit: Payer: Self-pay | Admitting: Family Medicine

## 2021-11-23 ENCOUNTER — Other Ambulatory Visit: Payer: Self-pay | Admitting: Family Medicine

## 2021-11-23 DIAGNOSIS — I10 Essential (primary) hypertension: Secondary | ICD-10-CM

## 2021-11-23 DIAGNOSIS — E039 Hypothyroidism, unspecified: Secondary | ICD-10-CM

## 2021-11-23 NOTE — Telephone Encounter (Signed)
Rx 04/09/21 #120 3RF- too soon Requested Prescriptions  Pending Prescriptions Disp Refills  . ANORO ELLIPTA 62.5-25 MCG/ACT AEPB [Pharmacy Med Name: Ernestina Patches 62.5-25 MCG INH] 60 each 7    Sig: INHALE 1 PUFF BY MOUTH EVERY DAY     Pulmonology:  Combination Products Passed - 11/21/2021  1:18 PM      Passed - Valid encounter within last 12 months    Recent Outpatient Visits          1 month ago Encounter for [employer] annual wellness exam   [facility] Maple Hudson., MD   8 months ago Essential hypertension   [facility] Maple Hudson., MD   1 year ago Annual physical exam   [facility] Maple Hudson., MD   2 years ago Arteriosclerosis of coronary artery   [facility] Maple Hudson., MD   2 years ago Perforation of right tympanic membrane   [facility] [place], [place], [place]      Future Appointments            In 2 months Maple Hudson., MD [facility], [facility]

## 2021-12-09 ENCOUNTER — Encounter: Payer: Self-pay | Admitting: Family Medicine

## 2021-12-09 NOTE — Telephone Encounter (Signed)
Please refer to Ortho.  I thought we had taken care of this.  Sorry

## 2021-12-13 ENCOUNTER — Other Ambulatory Visit: Payer: Self-pay | Admitting: *Deleted

## 2021-12-13 DIAGNOSIS — M25511 Pain in right shoulder: Secondary | ICD-10-CM

## 2021-12-16 DIAGNOSIS — M7541 Impingement syndrome of right shoulder: Secondary | ICD-10-CM | POA: Diagnosis not present

## 2021-12-22 DIAGNOSIS — M25511 Pain in right shoulder: Secondary | ICD-10-CM | POA: Diagnosis not present

## 2021-12-26 ENCOUNTER — Other Ambulatory Visit (HOSPITAL_COMMUNITY): Payer: Self-pay | Admitting: Physician Assistant

## 2021-12-28 ENCOUNTER — Telehealth: Payer: Self-pay

## 2021-12-28 NOTE — Telephone Encounter (Signed)
Pt last saw Clint Fenton, PA on 07/09/21, last labs 07/28/21 Creat 1.05, age 76, weight 84.8kg, CrCl 62.93, based on CrCl pt is on appropriate dosage of Xarelto 20mg  QD for afib.  Will refill rx.

## 2021-12-28 NOTE — Telephone Encounter (Signed)
Refill request

## 2021-12-28 NOTE — Progress Notes (Signed)
Chronic Care Management Pharmacy Assistant   Name: Penny Reed  MRN: [id number] DOB: 07/19/1945  Reason for Encounter: Hypertension Disease State Call   Recent office visits:  None ID  Recent consult visits:  None ID  Hospital visits:  None in previous 6 months  Medications: Outpatient Encounter Medications as of 12/28/2021  Medication Sig   albuterol (PROAIR HFA) 108 (90 BASE) MCG/ACT inhaler Inhale 2 puffs into the lungs every 6 (six) hours as needed for wheezing or shortness of breath.   amLODipine (NORVASC) 5 MG tablet TAKE 1 TABLET BY MOUTH EVERY DAY   ANORO ELLIPTA 62.5-25 MCG/ACT AEPB INHALE 1 PUFF BY MOUTH EVERY DAY   atorvastatin (LIPITOR) 80 MG tablet TAKE 1 TABLET BY MOUTH EVERY DAY   Azelastine HCl 137 MCG/SPRAY SOLN SPRAY 2 SPRAYS INTO EACH NOSTRIL EVERY DAY   benazepril (LOTENSIN) 20 MG tablet Take 1 tablet (20 mg total) by mouth daily.   Calcium Carbonate (CALCIUM 600 PO) Take 600 mg by mouth in the morning and at bedtime.   cetirizine (ZYRTEC) 10 MG tablet Take 10 mg by mouth daily as needed for allergies.   cholecalciferol (VITAMIN D3) 25 MCG (1000 UNIT) tablet Take 1,000 Units by mouth daily.   furosemide (LASIX) 40 MG tablet TAKE 1 TABLET (40 MG TOTAL) BY MOUTH DAILY AS NEEDED (SWELLING/WEIGHT GAIN).   levothyroxine (SYNTHROID) 112 MCG tablet TAKE 1 TABLET BY MOUTH SIX DAYS PER WEEK. TAKE 1 AND 1/2 TABLET BY MOUTH ONE DAY PER WEEK [facility])   Multiple Vitamin (MULTIVITAMIN) tablet Take 1 tablet by mouth daily. Standard Process   nitroGLYCERIN (NITROSTAT) 0.4 MG SL tablet Place 0.4 mg under the tongue every 5 (five) minutes as needed for chest pain.    Omega-3 Fatty Acids (FISH OIL) 1000 MG CAPS Take 3,000 mg by mouth at bedtime.   predniSONE (DELTASONE) 20 MG tablet Take 1 tablet (20 mg total) by mouth daily with breakfast.   XARELTO 20 MG TABS tablet TAKE 1 TABLET BY MOUTH DAILY WITH SUPPER. STOP ELIQUIS   zolpidem (AMBIEN) 5 MG tablet TAKE 1 TABLET BY  MOUTH EVERY DAY AT BEDTIME AS NEEDED FOR SLEEP   No facility-administered encounter medications on file as of 12/28/2021.   Care Gaps: Zoster Vaccines Influenza Vaccine BP > 140/90  Star Rating Drugs: Atorvastatin 80 mg last filled on 11/29/2021 for [facility]Day [facility] Benazepril 20 mg last filled on 12/21/2021 for a [facility]-Day supply with [facility]  Reviewed chart prior to disease state call. Spoke with patient regarding BP  Recent Office Vitals: BP Readings from Last 3 Encounters:  09/28/21 (!) 142/58  09/01/21 (!) 114/58  08/09/21 (!) 130/50   Pulse Readings from Last 3 Encounters:  09/28/21 (!) 48  09/01/21 (!) 49  08/09/21 (!) 45    Wt Readings from Last 3 Encounters:  09/28/21 187 lb (84.8 kg)  09/01/21 187 lb (84.8 kg)  08/09/21 186 lb 6.4 oz (84.6 kg)    Kidney Function Lab Results  Component Value Date/Time   CREATININE 1.05 (H) 07/28/2021 08:43 AM   CREATININE 0.76 05/11/2021 09:59 AM   GFRNONAA 56 (L) 07/28/2021 08:43 AM   GFRAA 81 05/29/2020 11:25 AM      Latest Ref Rng & Units 07/28/2021    8:43 AM 05/11/2021    9:59 AM 03/23/2021    9:00 AM  BMP  Glucose 70 - 99 mg/dL 403  474  259   BUN 8 - 23 mg/dL 24  16  17   Creatinine 0.44 - 1.00 mg/dL 4.13  2.44  0.10   BUN/Creat Ratio 12 - 28  21  23    Sodium 135 - 145 mmol/L 138  140  140   Potassium 3.5 - 5.1 mmol/L 3.9  4.8  4.4   Chloride 98 - 111 mmol/L 103  101  99   CO2 22 - 32 mmol/L 24  23  25    Calcium 8.9 - 10.3 mg/dL 9.6  9.8     Current antihypertensive regimen:  Amlodipine 5 mg 1 tablet daily Benazepril 20 mg 1 tablet daily  How often are you checking your Blood Pressure?  Patient stated she was checking it daily but this week she hasn't checked it much.  Current home BP readings: 121/52, 135/65, 127/60, 124/54  What recent interventions/DTPs have been made by any provider to improve Blood Pressure control since last CPP Visit: None ID  Any recent hospitalizations or ED  visits since last visit with CPP? No  What diet changes have been made to improve Blood Pressure Control?  No new changes to her diet  Adherence Review: Is the patient currently on ACE/ARB medication? Yes Does the patient have >5 day gap between last estimated fill dates? No  I spoke to the patient and she reports she is doing well. Per patient the Benazepril is working to keep her blood pressure normal. Patient does report a dry cough since starting this medication. Per patient the cough is not consistent, but it is there every once in awhile. I informed the patient I would inform PCP of this side effect.   Patient has no other concerns or issues at this time.    Patient has follow-up telephone appointment with , CPP on 04/04/2022 @ 1100.  [name], CPA/CMA Clinical Pharmacist Assistant Phone: [phone]

## 2021-12-28 NOTE — Telephone Encounter (Signed)
Prescription refill request for Xarelto received.  Indication: AF Last office visit: 08/09/21  C Fenton PA Weight: 84.6kg Age: 76 Scr: 1.05 on 07/28/21 CrCl: 62.78   Based on above findings Xarelto 20mg  daily is the appropriate dose.  Refill approved.

## 2022-01-15 ENCOUNTER — Other Ambulatory Visit: Payer: Self-pay | Admitting: Family Medicine

## 2022-01-15 DIAGNOSIS — J309 Allergic rhinitis, unspecified: Secondary | ICD-10-CM

## 2022-01-17 ENCOUNTER — Other Ambulatory Visit: Payer: Self-pay | Admitting: Family Medicine

## 2022-01-19 DIAGNOSIS — M25511 Pain in right shoulder: Secondary | ICD-10-CM | POA: Diagnosis not present

## 2022-01-26 DIAGNOSIS — M25511 Pain in right shoulder: Secondary | ICD-10-CM | POA: Diagnosis not present

## 2022-01-30 ENCOUNTER — Encounter: Payer: Self-pay | Admitting: Physician Assistant

## 2022-02-01 ENCOUNTER — Encounter: Payer: Self-pay | Admitting: Physician Assistant

## 2022-02-01 ENCOUNTER — Ambulatory Visit (INDEPENDENT_AMBULATORY_CARE_PROVIDER_SITE_OTHER): Payer: Medicare HMO | Admitting: Physician Assistant

## 2022-02-01 VITALS — BP 143/63 | HR 57 | Temp 97.9°F | Resp 14 | Wt 188.0 lb

## 2022-02-01 DIAGNOSIS — L731 Pseudofolliculitis barbae: Secondary | ICD-10-CM | POA: Diagnosis not present

## 2022-02-01 DIAGNOSIS — Z23 Encounter for immunization: Secondary | ICD-10-CM | POA: Diagnosis not present

## 2022-02-01 MED ORDER — MUPIROCIN 2 % EX OINT: 1.0000 | TOPICAL_OINTMENT | Freq: Two times a day (BID) | CUTANEOUS | 0 refills | Status: DC

## 2022-02-01 NOTE — Progress Notes (Signed)
I,Roshena L Chambers,acting as a scribe for [employer], PA-C.,have documented all relevant documentation on the behalf of [name], PA-C,as directed by  [name], PA-C while in the presence of [name], PA-C.   Established patient visit   Patient: Penny Reed   DOB: 01-24-1946   76 y.o. Female  MRN: [id number] Visit Date: 02/01/2022  Today's healthcare provider: [name], PA-C   Chief Complaint  Patient presents with   genital mass   Subjective    HPI  Genital mass: Patient complains of a mildly painful genital mass near the vaginal opening. Patient noticed the mass 2 days ago. She has tried applying warm compresses to the mass. Denies discharge, rash, vaginal pain.  Medications: Outpatient Medications Prior to Visit  Medication Sig   albuterol (PROAIR HFA) 108 (90 BASE) MCG/ACT inhaler Inhale 2 puffs into the lungs every 6 (six) hours as needed for wheezing or shortness of breath.   amLODipine (NORVASC) 5 MG tablet TAKE 1 TABLET BY MOUTH EVERY DAY   ANORO ELLIPTA 62.5-25 MCG/ACT AEPB INHALE 1 PUFF BY MOUTH EVERY DAY   atorvastatin (LIPITOR) 80 MG tablet TAKE 1 TABLET BY MOUTH EVERY DAY   Azelastine HCl 137 MCG/SPRAY SOLN SPRAY 2 SPRAYS INTO EACH NOSTRIL EVERY DAY   benazepril (LOTENSIN) 20 MG tablet Take 1 tablet (20 mg total) by mouth daily.   Calcium Carbonate (CALCIUM 600 PO) Take 600 mg by mouth in the morning and at bedtime.   cetirizine (ZYRTEC) 10 MG tablet Take 10 mg by mouth daily as needed for allergies.   cholecalciferol (VITAMIN D3) 25 MCG (1000 UNIT) tablet Take 1,000 Units by mouth daily.   furosemide (LASIX) 40 MG tablet TAKE 1 TABLET (40 MG TOTAL) BY MOUTH DAILY AS NEEDED (SWELLING/WEIGHT GAIN).   levothyroxine (SYNTHROID) 112 MCG tablet TAKE 1 TABLET BY MOUTH SIX DAYS PER WEEK. TAKE 1 AND 1/2 TABLET BY MOUTH ONE DAY PER WEEK [facility])   Multiple Vitamin (MULTIVITAMIN) tablet Take 1 tablet by mouth daily. Standard Process    nitroGLYCERIN (NITROSTAT) 0.4 MG SL tablet Place 0.4 mg under the tongue every 5 (five) minutes as needed for chest pain.    Omega-3 Fatty Acids (FISH OIL) 1000 MG CAPS Take 3,000 mg by mouth at bedtime.   XARELTO 20 MG TABS tablet TAKE 1 TABLET BY MOUTH DAILY WITH SUPPER. STOP ELIQUIS   zolpidem (AMBIEN) 5 MG tablet TAKE 1 TABLET BY MOUTH AT BEDTIME AS NEEDED FOR SLEEP   [DISCONTINUED] predniSONE (DELTASONE) 20 MG tablet Take 1 tablet (20 mg total) by mouth daily with breakfast. (Patient not taking: Reported on 02/01/2022)   No facility-administered medications prior to visit.    Review of Systems  Constitutional:  Negative for appetite change, chills, fatigue and fever.  Respiratory:  Negative for chest tightness and shortness of breath.   Cardiovascular:  Negative for chest pain and palpitations.  Gastrointestinal:  Negative for abdominal pain, nausea and vomiting.  Genitourinary:        Genital mass  Neurological:  Negative for dizziness and weakness.     Objective    BP (!) 143/63   Pulse (!) 57   Temp 97.9 F (36.6 C) (Oral)   Resp 14   Wt 188 lb (85.3 kg)   SpO2 97% Comment: room air  BMI 29.44 kg/m    Physical Exam Genitourinary:      Comments: <1 cm circular mass on right outer labia tender to touch no associated erythema or  purulence.     No results found for any visits on 02/01/22.  Assessment & Plan     Ingrown hair Advised warm compresses, epsom salt baths/soaks, rx topical abx ointment  Problem List Items Addressed This Visit   None Visit Diagnoses     Ingrown hair    -  Primary   Relevant Medications   mupirocin ointment (BACTROBAN) 2 %   Need for immunization against influenza       Relevant Orders   Flu Vaccine QUAD High Dose(Fluad) (Completed)       Return in about 2 months (around 04/03/2022), or if symptoms worsen or fail to improve, for hypertension.     I, [name], PA-C have reviewed all documentation for this visit. The  documentation on  02/01/2022 for the exam, diagnosis, procedures, and orders are all accurate and complete.  [name], PA-C [facility] [address] [place], [place], [address] Office: [phone] Fax: [place] [facility]

## 2022-02-08 ENCOUNTER — Ambulatory Visit: Payer: Medicare HMO | Admitting: Physician Assistant

## 2022-02-08 ENCOUNTER — Ambulatory Visit: Payer: Medicare HMO | Admitting: Family Medicine

## 2022-02-21 DIAGNOSIS — M79604 Pain in right leg: Secondary | ICD-10-CM | POA: Diagnosis not present

## 2022-02-21 DIAGNOSIS — M79605 Pain in left leg: Secondary | ICD-10-CM | POA: Diagnosis not present

## 2022-02-21 DIAGNOSIS — M25511 Pain in right shoulder: Secondary | ICD-10-CM | POA: Diagnosis not present

## 2022-03-22 DIAGNOSIS — R000 Tachycardia, unspecified: Secondary | ICD-10-CM | POA: Diagnosis not present

## 2022-03-22 DIAGNOSIS — Z951 Presence of aortocoronary bypass graft: Secondary | ICD-10-CM | POA: Diagnosis not present

## 2022-03-22 DIAGNOSIS — J449 Chronic obstructive pulmonary disease, unspecified: Secondary | ICD-10-CM | POA: Diagnosis not present

## 2022-03-22 DIAGNOSIS — I2510 Atherosclerotic heart disease of native coronary artery without angina pectoris: Secondary | ICD-10-CM | POA: Diagnosis not present

## 2022-03-22 DIAGNOSIS — I4819 Other persistent atrial fibrillation: Secondary | ICD-10-CM | POA: Diagnosis not present

## 2022-03-22 DIAGNOSIS — I739 Peripheral vascular disease, unspecified: Secondary | ICD-10-CM | POA: Diagnosis not present

## 2022-03-22 DIAGNOSIS — I209 Angina pectoris, unspecified: Secondary | ICD-10-CM | POA: Diagnosis not present

## 2022-03-22 DIAGNOSIS — R001 Bradycardia, unspecified: Secondary | ICD-10-CM | POA: Diagnosis not present

## 2022-03-22 DIAGNOSIS — I10 Essential (primary) hypertension: Secondary | ICD-10-CM | POA: Diagnosis not present

## 2022-04-04 ENCOUNTER — Ambulatory Visit (INDEPENDENT_AMBULATORY_CARE_PROVIDER_SITE_OTHER): Payer: Medicare HMO

## 2022-04-04 DIAGNOSIS — J449 Chronic obstructive pulmonary disease, unspecified: Secondary | ICD-10-CM

## 2022-04-04 DIAGNOSIS — I4819 Other persistent atrial fibrillation: Secondary | ICD-10-CM

## 2022-04-04 NOTE — Patient Instructions (Signed)
Visit Information It was great speaking with you today!  Please let me know if you have any questions about our visit.   Goals Addressed             This Visit's Progress    Track and Manage My Symptoms-COPD   On track    Timeframe:  Long-Range Goal Priority:  High Start Date: 12/15/20                            Expected End Date: 12/15/2021                      Follow Up within 90 days   - begin a symptom diary - develop a rescue plan - eliminate symptom triggers at home - follow rescue plan if symptoms flare-up    Why is this important?   Tracking your symptoms and other information about your health helps your doctor plan your care.  Write down the symptoms, the time of day, what you were doing and what medicine you are taking.  You will soon learn how to manage your symptoms.     Notes:         Patient Care Plan: General Pharmacy (Adult)     Problem Identified: Hypertension, Hyperlipidemia, Atrial Fibrillation, Coronary Artery Disease, GERD, COPD, Hypothyroidism, Allergic Rhinitis, and Insomnia   Priority: High     Long-Range Goal: Patient-Specific Goal   Start Date: 12/15/2020  Expected End Date: 04/05/2023  This Visit's Progress: On track  Recent Progress: On track  Priority: High  Note:   Current Barriers:  Unable to independently afford treatment regimen  Pharmacist Clinical Goal(s):  Patient will verbalize ability to afford treatment regimen through collaboration with PharmD and provider.   Interventions: 1:1 collaboration with [name], PA-C regarding development and update of comprehensive plan of care as evidenced by provider attestation and co-signature Inter-disciplinary care team collaboration (see longitudinal plan of care) Comprehensive medication review performed; medication list updated in electronic medical record  Hypertension (BP goal <140/90) -Controlled -Current treatment: Amlodipine 5 mg daily  Furosemide 40 mg 1/2 tablet  daily as needed  Benazepril 20 mg daily  -Medications previously tried: Quinapril (discontinued) -Current home readings: 140/70s - 150/80s  -Reports hypertensive symptoms: dull headache that comes and goes.  -Home blood pressures mildly elevated, but patient has only taken benazepril for ~1 week so full blood pressure effect not yet seen.  -Recommended to continue current medication -Recheck BP in 2-3 weeks to re-assess benazepril dose.   Hyperlipidemia: (LDL goal < 70) -Controlled -Current treatment: Atorvastatin 80 mg daily  -Medications previously tried: NA  -Recommended to continue current medication  Atrial Flutter (Goal: prevent stroke and major bleeding) -Controlled -CHADSVASC: 4 -Current treatment: Rate control: None Anticoagulation: Xarelto 20 mg daily -Medications previously tried: Eliquis (Swelling) -Denies symptoms of A-Fib.  -Recommended to continue current medication  COPD (Goal: control symptoms and prevent exacerbations) -Controlled -Current treatment  Albuterol HFA 2 puffs every 6 hours as needed Anoro 1 puff daily  -Medications previously tried: NA -Exacerbations requiring treatment in last 6 months: None -Patient reports consistent use of maintenance inhaler -Frequency of rescue inhaler use: 1x monthly.  CAT ASSESSMENT  Rank each of the following items on a scale of 0 to 5 (with 5 being most severe) Write a # 0-5 in each box  I never cough (0) > I cough all the time (5) 1  I have no  phlegm (mucus) in my chest (0) > My chest is completely full of phlegm (mucus) (5) 0  My chest does not feel tight at all (0) > My chest feels very tight (5) 0  When I walk up a hill or one flight of stairs I am not breathless (0) > When I walk up a hill or one flight of stairs I am very breathless (5) 4   I am not limited doing any activities at home (0) > I am very limited doing activities at home (5) 1  I am confident leaving my home despite my lung function (0) > I am not  at all confident leaving my home because of my lung condition (5)  0  I sleep soundly (0) > I don't sleep soundly because of my lung condition (5) 0  I have lots of energy (0) > I have no energy at all (5) 1   Total CAT Score: 7 -Continue current medications  Patient Goals/Self-Care Activities Patient will:  - check blood pressure weekly, document, and provide at future appointments  Follow Up Plan: Telephone follow up appointment with care management team member scheduled for:  08/29/2022 at 11:00 AM    Patient agreed to services and verbal consent obtained.   Patient verbalizes understanding of instructions and care plan provided today and agrees to view in MyChart. Active MyChart status and patient understanding of how to access instructions and care plan via MyChart confirmed with patient.     [name], PharmD, Patsy Baltimore, CPP  Clinical Pharmacist Practitioner  [facility] [phone]

## 2022-04-04 NOTE — Progress Notes (Signed)
Chronic Care Management Pharmacy Note  04/04/2022 Name:  Penny Reed MRN:  [id number] DOB:  12-30-1945  Summary: Patient presents for CCM follow-up.   -Home blood pressures mildly elevated, but patient has only taken benazepril for ~1 week so full blood pressure effect not yet seen.   -Reports wheezing when climbing her stairs.   Recommendations/Changes made from today's visit: Continue Current Medications -Recommend referral to recheck PFTs at next PCP follow-up.    Plan: HC BP assessment in 2-3 weeks  CPP follow-up 6 months  Subjective: Penny Reed is an 76 y.o. year old female who is a primary patient of Jerrol Banana., MD.  The CCM team was consulted for assistance with disease management and care coordination needs.    Engaged with patient by telephone for follow up visit in response to provider referral for pharmacy case management and/or care coordination services.   Consent to Services:  The patient was given information about Chronic Care Management services, agreed to services, and gave verbal consent prior to initiation of services.  Please see initial visit note for detailed documentation.   Patient Care Team: Jerrol Banana., MD as PCP - General (Family Medicine) Vickie Epley, MD as PCP - Electrophysiology (Cardiology) Idelle Leech, OD as Consulting Physician (Optometry) Yolonda Kida, MD as Consulting Physician (Cardiology) Gabriel Carina Betsey Holiday, MD as Physician Assistant (Endocrinology) Vickii Penna, DMD (Dentistry) Germaine Pomfret, [facility] (Pharmacist)  Recent office visits: 02/01/22: Patient presented to Juleen China, PA-C for ingrown hair.  09/28/21: Patient presented to Dr. Rosanna Randy for follow-up. STOP quinapril, start benazepril 20 mg, prednisone 20 mg daily.   Recent consult visits: 03/22/22: Patient presented to Dr. Clayborn Bigness (Cardiology) for follow-up.  09/06/21: Patient presented to Dr. Clayborn Bigness (Cardiology) for  follow-up. No medication changes made.  09/01/21: Patient presented to Dr. Quentin Ore (Cardiology) for follow-up. No medication changes made.  08/10/21: Patient presented to Dr. Clayborn Bigness (Cardiology) for follow-up.  Hospital visits: 09/04/20: Patient hospitalzied for SVT ablation. Patient with a-flutter, started on Eliquis. Switched to Xarelto after allergy.    Objective:  Lab Results  Component Value Date   CREATININE 1.05 (H) 07/28/2021   BUN 24 (H) 07/28/2021   GFRNONAA 56 (L) 07/28/2021   GFRAA 81 05/29/2020   NA 138 07/28/2021   K 3.9 07/28/2021   CALCIUM 9.6 07/28/2021   CO2 24 07/28/2021   GLUCOSE 126 (H) 07/28/2021    Lab Results  Component Value Date/Time   HGBA1C 5.8 (H) 03/23/2021 09:00 AM    Last diabetic Eye exam: No results found for: "HMDIABEYEEXA"  Last diabetic Foot exam: No results found for: "HMDIABFOOTEX"   Lab Results  Component Value Date   CHOL 172 03/23/2021   HDL 71 03/23/2021   LDLCALC 88 03/23/2021   TRIG 69 03/23/2021   CHOLHDL 2.4 03/23/2021       Latest Ref Rng & Units 03/23/2021    9:00 AM 09/05/2018   11:49 AM 08/28/2017   12:23 PM  Hepatic Function  Total Protein 6.0 - 8.5 g/dL 7.0  7.1  7.1   Albumin 3.7 - 4.7 g/dL 4.7  4.5  4.5   AST 0 - 40 IU/L 29  38  27   ALT 0 - 32 IU/L 23   20   Alk Phosphatase 44 - 121 IU/L 92  77  90   Total Bilirubin 0.0 - 1.2 mg/dL 0.8  1.0  0.6     Lab Results  Component  Value Date/Time   TSH 2.310 03/23/2021 09:00 AM   TSH 5.740 (H) 09/05/2018 11:49 AM       Latest Ref Rng & Units 07/28/2021    8:43 AM 05/11/2021    9:59 AM 03/23/2021    9:00 AM  CBC  WBC 4.0 - 10.5 K/uL 5.1  5.8  4.5   Hemoglobin 12.0 - 15.0 g/dL 13.2  13.7  13.5   Hematocrit 36.0 - 46.0 % 39.5  40.3  39.7   Platelets 150 - 400 K/uL 159  216  203     No results found for: "VD25OH"  Clinical ASCVD: Yes  The ASCVD Risk score (Arnett DK, et al., 2019) failed to calculate for the following reasons:   The patient has a prior MI or  stroke diagnosis       09/28/2021   10:08 AM 03/23/2021    8:26 AM 09/16/2020    9:22 AM  Depression screen PHQ 2/9  Decreased Interest 0 0 0  Down, Depressed, Hopeless 0 0 0  PHQ - 2 Score 0 0 0  Altered sleeping 0 2 0  Tired, decreased energy 1 1 0  Change in appetite 1 0 0  Feeling bad or failure about yourself  0 0 0  Trouble concentrating 0 0 0  Moving slowly or fidgety/restless 0 0 0  Suicidal thoughts 0 0 0  PHQ-9 Score 2 3 0  Difficult doing work/chores Not difficult at all Not difficult at all Not difficult at all    Social History   Tobacco Use  Smoking Status Former   Packs/day: 0.25   Years: 25.00   Total pack years: 6.25   Types: Cigarettes   Quit date: 04/25/2005   Years since quitting: 16.9  Smokeless Tobacco Never  Tobacco Comments   Former smoker 06/28/21   BP Readings from Last 3 Encounters:  02/01/22 (!) 143/63  09/28/21 (!) 142/58  09/01/21 (!) 114/58   Pulse Readings from Last 3 Encounters:  02/01/22 (!) 57  09/28/21 (!) 48  09/01/21 (!) 49   Wt Readings from Last 3 Encounters:  02/01/22 188 lb (85.3 kg)  09/28/21 187 lb (84.8 kg)  09/01/21 187 lb (84.8 kg)   BMI Readings from Last 3 Encounters:  02/01/22 29.44 kg/m  09/28/21 29.29 kg/m  09/01/21 29.29 kg/m    Assessment/Interventions: Review of patient past medical history, allergies, medications, health status, including review of consultants reports, laboratory and other test data, was performed as part of comprehensive evaluation and provision of chronic care management services.   SDOH:  (Social Determinants of Health) assessments and interventions performed: Yes SDOH Interventions    Flowsheet Row Office Visit from 09/28/2021 in [place] Visit from 03/23/2021 in [place] Management from 03/09/2021 in [place] Management from 12/15/2020 in [place] Management from  04/27/2020 in [place] Interventions       Transportation Interventions -- -- -- -- Intervention Not Indicated  Depression Interventions/Treatment  PHQ2-9 Score <4 Follow-up Not Indicated PHQ2-9 Score <4 Follow-up Not Indicated -- -- --  Financial Strain Interventions -- -- Intervention Not Indicated Other (Comment)  [PAP] Intervention Not Indicated        SDOH Screenings   Food Insecurity: No Food Insecurity (09/04/2019)  Housing: [place]  (09/04/2019)  Transportation Needs: No Transportation Needs (04/27/2020)  Alcohol Screen: Low Risk  (09/28/2021)  Depression (PHQ2-9): Low Risk  (09/28/2021)  Financial  Resource Strain: Low Risk  (03/16/2021)  Physical Activity: Sufficiently Active (09/04/2019)  Social Connections: Moderately Integrated (09/04/2019)  Stress: No Stress Concern Present (09/04/2019)  Tobacco Use: Medium Risk (02/01/2022)    CCM Care Plan  Allergies  Allergen Reactions   Adhesive [Tape]     Band-aids irritate skin   Azithromycin Diarrhea and Nausea And Vomiting   Eliquis [Apixaban] Swelling    Leg and ankle swelling    Medications Reviewed Today     Reviewed by [name], PA-C (Physician Assistant Certified) on 27/06/23 at [place] List Status: <None>   Medication Order Taking? Sig Documenting Provider Last Dose Status Informant  albuterol (PROAIR HFA) 108 (90 BASE) MCG/ACT inhaler [id number] Yes Inhale 2 puffs into the lungs every 6 (six) hours as needed for wheezing or shortness of breath. Jerrol Banana., MD Taking Active Self  amLODipine [facility]) 5 MG tablet [id number] Yes TAKE 1 TABLET BY MOUTH EVERY DAY Jerrol Banana., MD Taking Active   [facility] ELLIPTA 62.5-25 MCG/ACT AEPB [id number] Yes INHALE 1 PUFF BY MOUTH EVERY DAY Jerrol Banana., MD Taking Active   atorvastatin (LIPITOR) 80 MG tablet [id number] Yes TAKE 1 TABLET BY MOUTH EVERY DAY Jerrol Banana., MD Taking Active Self  Azelastine HCl 137 MCG/SPRAY  SOLN [id number] Yes SPRAY 2 SPRAYS INTO EACH NOSTRIL EVERY DAY Jerrol Banana., MD Taking Active   benazepril (LOTENSIN) 20 MG tablet [id number] Yes Take 1 tablet (20 mg total) by mouth daily. Jerrol Banana., MD Taking Active   Calcium Carbonate (CALCIUM 600 PO) [id number] Yes Take 600 mg by mouth in the morning and at bedtime. [provider] Taking Active Self  cetirizine (ZYRTEC) 10 MG tablet [id number] Yes Take 10 mg by mouth daily as needed for allergies. [provider] Taking Active Self  cholecalciferol (VITAMIN D3) 25 MCG (1000 UNIT) tablet [id number] Yes Take 1,000 Units by mouth daily. [provider] Taking Active Self  furosemide (LASIX) 40 MG tablet [id number] Yes TAKE 1 TABLET (40 MG TOTAL) BY MOUTH DAILY AS NEEDED (SWELLING/WEIGHT GAIN). Jerrol Banana., MD Taking Active   levothyroxine (SYNTHROID) 112 MCG tablet [id number] Yes TAKE 1 TABLET BY MOUTH SIX DAYS PER WEEK. TAKE 1 AND 1/2 TABLET BY MOUTH ONE DAY PER WEEK (WEDNESDAYS) Jerrol Banana., MD Taking Active   Multiple Vitamin (MULTIVITAMIN) tablet [id number] Yes Take 1 tablet by mouth daily. Standard Process [provider] Taking Active Self  mupirocin ointment (BACTROBAN) 2 % [id number] Yes Apply 1 Application topically 2 (two) times daily. [name], PA-C  Active   nitroGLYCERIN (NITROSTAT) 0.4 MG SL tablet [id number] Yes Place 0.4 mg under the tongue every 5 (five) minutes as needed for chest pain.  [provider] Taking Active Self  Omega-3 Fatty Acids (FISH OIL) 1000 MG CAPS [id number] Yes Take 3,000 mg by mouth at bedtime. [provider] Taking Active Self  XARELTO 20 MG TABS tablet [id number] Yes TAKE 1 TABLET BY MOUTH DAILY WITH SUPPER. STOP Joaquin Music, MD Taking Active   zolpidem [facility]) 5 MG tablet [id number] Yes TAKE 1 TABLET BY MOUTH AT BEDTIME AS NEEDED FOR SLEEP Jerrol Banana., MD Taking Active              Patient Active Problem List   Diagnosis Date Noted   Persistent atrial fibrillation ([place]) 07/09/2021   Secondary hypercoagulable state ([place]) 06/28/2021   Atrial flutter ([place]) 03/24/2021   Ascending  aortic aneurysm ([place]) 07/23/2019   Insomnia 01/26/2018   Angina pectoris ([place]) 12/09/2014   BP (high blood pressure) 12/09/2014   Adiposity 12/09/2014   Dyspnea 09/25/2014   Abnormal respiratory rate 09/25/2014   Acquired hypothyroidism 09/21/2014   Allergic rhinitis 09/21/2014   Arteriosclerosis of coronary artery 09/21/2014   Essential (primary) hypertension 09/21/2014   HLD (hyperlipidemia) 09/21/2014   Cardiomegaly 09/21/2014   Familial multiple lipoprotein-type hyperlipidemia 09/21/2014   CAFL (chronic airflow limitation) (HCC) 08/06/2013   Acid reflux 08/06/2013   Breath shortness 08/06/2013   Disease of thyroid gland 08/06/2013   COPD, moderate ([place]) 05/24/2013    Immunization History  Administered Date(s) Administered   Fluad Quad(high Dose 65+) 01/11/2019, 03/23/2021, 02/01/2022   Influenza, High Dose Seasonal PF 02/23/2017, 01/02/2018, 01/17/2020   Influenza,inj,Quad PF,6+ Mos 03/20/2013   Influenza-Unspecified 12/24/2013, 12/24/2013, 01/24/2015   Moderna Covid-19 Vaccine Bivalent Booster 28yr & up 05/04/2021   PFIZER(Purple Top)SARS-COV-2 Vaccination 06/18/2019, 07/09/2019   Pneumococcal Conjugate-13 03/20/2013   Pneumococcal Polysaccharide-23 04/02/2014   Pneumococcal-Unspecified 03/24/2013   Respiratory Syncytial Virus Vaccine,Recomb Aduvanted(Arexvy) 02/22/2022   Td 07/10/2003   Tdap 01/02/2018    Conditions to be addressed/monitored:  Hypertension, Hyperlipidemia, Atrial Fibrillation, Coronary Artery Disease, GERD, COPD, Hypothyroidism, Allergic Rhinitis, and Insomnia  Care Plan : General Pharmacy (Adult)  Updates made by FGermaine Pomfret RPH since 04/04/2022 12:00 AM     Problem: Hypertension, Hyperlipidemia, Atrial Fibrillation, Coronary Artery  Disease, GERD, COPD, Hypothyroidism, Allergic Rhinitis, and Insomnia   Priority: High     Long-Range Goal: Patient-Specific Goal   Start Date: 12/15/2020  Expected End Date: 04/05/2023  This Visit's Progress: On track  Recent Progress: On track  Priority: High  Note:   Current Barriers:  Unable to independently afford treatment regimen  Pharmacist Clinical Goal(s):  Patient will verbalize ability to afford treatment regimen through collaboration with PharmD and provider.   Interventions: 1:1 collaboration with DMikey Kirschner PA-C regarding development and update of comprehensive plan of care as evidenced by provider attestation and co-signature Inter-disciplinary care team collaboration (see longitudinal plan of care) Comprehensive medication review performed; medication list updated in electronic medical record  Hypertension (BP goal <140/90) -Controlled -Current treatment: Amlodipine 5 mg daily  Furosemide 40 mg 1/2 tablet daily as needed  Benazepril 20 mg daily  -Medications previously tried: Quinapril (discontinued) -Current home readings: 140/70s - 150/80s  -Reports hypertensive symptoms: dull headache that comes and goes.  -Home blood pressures mildly elevated, but patient has only taken benazepril for ~1 week so full blood pressure effect not yet seen.  -Recommended to continue current medication -Recheck BP in 2-3 weeks to re-assess benazepril dose.   Hyperlipidemia: (LDL goal < 70) -Controlled -Current treatment: Atorvastatin 80 mg daily  -Medications previously tried: NA  -Recommended to continue current medication  Atrial Flutter (Goal: prevent stroke and major bleeding) -Controlled -CHADSVASC: 4 -Current treatment: Rate control: None Anticoagulation: Xarelto 20 mg daily -Medications previously tried: Eliquis (Swelling) -Denies symptoms of A-Fib.  -Recommended to continue current medication  COPD (Goal: control symptoms and prevent  exacerbations) -Controlled -Current treatment  Albuterol HFA 2 puffs every 6 hours as needed Anoro 1 puff daily  -Medications previously tried: NA -Exacerbations requiring treatment in last 6 months: None -Patient reports consistent use of maintenance inhaler -Frequency of rescue inhaler use: 1x monthly.  CAT ASSESSMENT  Rank each of the following items on a scale of 0 to 5 (with 5 being most severe) Write a # 0-5 in each box  I never  cough (0) > I cough all the time (5) 1  I have no phlegm (mucus) in my chest (0) > My chest is completely full of phlegm (mucus) (5) 0  My chest does not feel tight at all (0) > My chest feels very tight (5) 0  When I walk up a hill or one flight of stairs I am not breathless (0) > When I walk up a hill or one flight of stairs I am very breathless (5) 4   I am not limited doing any activities at home (0) > I am very limited doing activities at home (5) 1  I am confident leaving my home despite my lung function (0) > I am not at all confident leaving my home because of my lung condition (5)  0  I sleep soundly (0) > I don't sleep soundly because of my lung condition (5) 0  I have lots of energy (0) > I have no energy at all (5) 1   Total CAT Score: 7 -Continue current medications  Patient Goals/Self-Care Activities Patient will:  - check blood pressure weekly, document, and provide at future appointments  Follow Up Plan: Telephone follow up appointment with care management team member scheduled for:  08/29/2022 at 11:00 AM     Medication Assistance: Application for Anoro Ellipta  medication assistance program. in process.  Anticipated assistance start date TBD.  See plan of care for additional detail.  Compliance/Adherence/Medication fill history: Care Gaps: Shingrix Covid Vaccine Influenza   Star-Rating Drugs: Atorvastatin 80 mg: Last filled 09/02/20 for 90-DS  Patient's preferred pharmacy is:  [facility] [address], [place] [address] Phone: [phone] Fax: [place], [address] [place] [address] Phone: [phone] Fax: [phone]  [id number] [place], [place] [address] [id number]: [phone]: [phone] [id number] [id number]: [phone]: [phone] Uses pill box? Yes Pt endorses 100% compliance  We discussed: Current pharmacy is preferred with insurance plan and patient is satisfied with pharmacy services Patient decided to: Continue current medication management strategy  Care Plan and Follow Up Patient Decision:  Patient agrees to Care Plan and Follow-up.  Plan: Telephone follow up appointment with care management team member scheduled for:  08/29/2022 at 11:00 AM  AlJunius ArgylePharmD, BCPara [id number] [phone]

## 2022-04-05 NOTE — Progress Notes (Signed)
I,[name],acting as a Neurosurgeon for [employer], PA-C.,have documented all relevant documentation on the behalf of Penny Ferguson, PA-C,as directed by  Penny Ferguson, PA-C while in the presence of Penny Ferguson, PA-C.   Established patient visit   Patient: Penny Reed   DOB: [birth year]   76 y.o. Female  MRN: [id number] Visit Date: 04/06/2022  Today's healthcare provider: [name], PA-C   Cc. Htn, copd   Subjective    HPI  Hypertension, follow-up  BP Readings from Last 3 Encounters:  04/06/22 136/78  02/01/22 (!) 143/63  09/28/21 (!) 142/58   Wt Readings from Last 3 Encounters:  04/06/22 183 lb 3.2 oz (83.1 kg)  02/01/22 188 lb (85.3 kg)  09/28/21 187 lb (84.8 kg)     She was last seen for hypertension 6 months ago.  BP at that visit was 142/58. Management since that visit includes monitoring at home.  She reports excellent compliance with treatment. She is not having side effects.  She is following a Regular diet. She is exercising. She does not smoke.  Use of agents associated with hypertension: none.   Outside blood pressures are within normal range  Symptoms: No chest pain No chest pressure  No palpitations No syncope  Yes dyspnea No orthopnea  No paroxysmal nocturnal dyspnea Yes lower extremity edema   Pertinent labs Lab Results  Component Value Date   CHOL 172 03/23/2021   HDL 71 03/23/2021   LDLCALC 88 03/23/2021   TRIG 69 03/23/2021   CHOLHDL 2.4 03/23/2021   Lab Results  Component Value Date   NA 138 07/28/2021   K 3.9 07/28/2021   CREATININE 1.05 (H) 07/28/2021   GFRNONAA 56 (L) 07/28/2021   GLUCOSE 126 (H) 07/28/2021   TSH 2.310 03/23/2021     The ASCVD Risk score (Arnett DK, et al., 2019) failed to calculate for the following reasons:   The patient has a prior MI or stroke diagnosis  COPD, Follow up  She was last seen for this 6 months ago. Changes made include none.   She reports excellent compliance with  treatment. She is not having side effects.  she uses rescue inhaler 1 per months. She IS experiencing dyspnea, cough, and fatigue. She is NOT experiencing chills or fever. she reports breathing is Unchanged.  Pulmonary Functions Testing Results:  TLC  Date Value Ref Range Status  09/25/2014 5.06 L Final   Pt has no other concerns.    Medications: Outpatient Medications Prior to Visit  Medication Sig   albuterol (PROAIR HFA) 108 (90 BASE) MCG/ACT inhaler Inhale 2 puffs into the lungs every 6 (six) hours as needed for wheezing or shortness of breath.   amLODipine (NORVASC) 5 MG tablet TAKE 1 TABLET BY MOUTH EVERY DAY   ANORO ELLIPTA 62.5-25 MCG/ACT AEPB INHALE 1 PUFF BY MOUTH EVERY DAY   atorvastatin (LIPITOR) 80 MG tablet TAKE 1 TABLET BY MOUTH EVERY DAY   Azelastine HCl 137 MCG/SPRAY SOLN SPRAY 2 SPRAYS INTO EACH NOSTRIL EVERY DAY   benazepril (LOTENSIN) 20 MG tablet Take 1 tablet (20 mg total) by mouth daily.   Calcium Carbonate (CALCIUM 600 PO) Take 600 mg by mouth in the morning and at bedtime.   cetirizine (ZYRTEC) 10 MG tablet Take 10 mg by mouth daily as needed for allergies.   cholecalciferol (VITAMIN D3) 25 MCG (1000 UNIT) tablet Take 1,000 Units by mouth daily.   levothyroxine (SYNTHROID) 112 MCG tablet TAKE 1 TABLET BY MOUTH SIX  DAYS PER WEEK. TAKE 1 AND 1/2 TABLET BY MOUTH ONE DAY PER WEEK [facility])   Multiple Vitamin (MULTIVITAMIN) tablet Take 1 tablet by mouth daily. Standard Process   mupirocin ointment (BACTROBAN) 2 % Apply 1 Application topically 2 (two) times daily.   nitroGLYCERIN (NITROSTAT) 0.4 MG SL tablet Place 0.4 mg under the tongue every 5 (five) minutes as needed for chest pain.    Omega-3 Fatty Acids (FISH OIL) 1000 MG CAPS Take 3,000 mg by mouth at bedtime.   XARELTO 20 MG TABS tablet TAKE 1 TABLET BY MOUTH DAILY WITH SUPPER. STOP ELIQUIS   zolpidem (AMBIEN) 5 MG tablet TAKE 1 TABLET BY MOUTH AT BEDTIME AS NEEDED FOR SLEEP   [DISCONTINUED] furosemide  (LASIX) 40 MG tablet TAKE 1 TABLET (40 MG TOTAL) BY MOUTH DAILY AS NEEDED (SWELLING/WEIGHT GAIN).   No facility-administered medications prior to visit.    Review of Systems  Constitutional:  Negative for fatigue and fever.  Respiratory:  Negative for cough and shortness of breath.   Cardiovascular:  Positive for leg swelling. Negative for chest pain.  Gastrointestinal:  Negative for abdominal pain.  Musculoskeletal:  Positive for myalgias.  Neurological:  Negative for dizziness and headaches.      Objective    Blood pressure 136/78, pulse (!) 110, temperature 97.6 F (36.4 C), temperature source Oral, weight 183 lb 3.2 oz (83.1 kg), SpO2 100 %.   Physical Exam Constitutional:      General: She is awake.     Appearance: She is well-developed.  HENT:     Head: Normocephalic.  Eyes:     Conjunctiva/sclera: Conjunctivae normal.  Cardiovascular:     Rate and Rhythm: Regular rhythm. Tachycardia present.     Heart sounds: Normal heart sounds.  Pulmonary:     Effort: Pulmonary effort is normal.     Breath sounds: Normal breath sounds.  Skin:    General: Skin is warm.  Neurological:     Mental Status: She is alert and oriented to person, place, and time.  Psychiatric:        Attention and Perception: Attention normal.        Mood and Affect: Mood normal.        Speech: Speech normal.        Behavior: Behavior is cooperative.      No results found for any visits on 04/06/22.  Assessment & Plan     Problem List Items Addressed This Visit       Cardiovascular and Mediastinum   Essential (primary) hypertension - Primary    Chronic, well controlled Managed on amlodipine 5 mg and benazepril 20 mg Continue current medications Repeat CMP appears there was an AKI last check      Relevant Medications   furosemide (LASIX) 40 MG tablet   Other Relevant Orders   Comprehensive Metabolic Panel (CMET)     Respiratory   COPD, moderate (HCC)    Stable, rare albuterol  use Continue anoro ellipta use daily         Endocrine   Acquired hypothyroidism    Stable, managed on levothyroxine 112 mcg Repeat tsh/t4      Relevant Orders   TSH + free T4     Other   HLD (hyperlipidemia)    Managed on atorvastatin 80 mg  LDL goal < 70  Repeat fasting lipids/cmp      Relevant Medications   furosemide (LASIX) 40 MG tablet   Other Relevant Orders   Lipid Profile  Tachycardia    On exam today Heart rate regular, tachy <115 Advised pt to monitor symptoms -- any sob, dizziness, palpitations to call office.        Ankle edema    Manages with lasix 40 mg PRN, reports use once a week Has consult with vascular scheduled.       Relevant Medications   furosemide (LASIX) 40 MG tablet   Hyperglycemia    Historically Will check fasting cmp and a1c      Relevant Orders   HgB A1c    Return in about 6 months (around 10/06/2022) for CPE, AVW.     I, Penny Ferguson, PA-C have reviewed all documentation for this visit. The documentation on  04/06/2022  for the exam, diagnosis, procedures, and orders are all accurate and complete.  Penny Ferguson, PA-C [facility] [address] [place], [place], [address] Office: [phone] Fax: [phone]   [facility]

## 2022-04-06 ENCOUNTER — Ambulatory Visit (INDEPENDENT_AMBULATORY_CARE_PROVIDER_SITE_OTHER): Payer: Medicare HMO | Admitting: Physician Assistant

## 2022-04-06 ENCOUNTER — Encounter: Payer: Self-pay | Admitting: Physician Assistant

## 2022-04-06 VITALS — BP 136/78 | HR 110 | Temp 97.6°F | Wt 183.2 lb

## 2022-04-06 DIAGNOSIS — R739 Hyperglycemia, unspecified: Secondary | ICD-10-CM

## 2022-04-06 DIAGNOSIS — E7800 Pure hypercholesterolemia, unspecified: Secondary | ICD-10-CM | POA: Diagnosis not present

## 2022-04-06 DIAGNOSIS — J449 Chronic obstructive pulmonary disease, unspecified: Secondary | ICD-10-CM | POA: Diagnosis not present

## 2022-04-06 DIAGNOSIS — E039 Hypothyroidism, unspecified: Secondary | ICD-10-CM | POA: Diagnosis not present

## 2022-04-06 DIAGNOSIS — I10 Essential (primary) hypertension: Secondary | ICD-10-CM

## 2022-04-06 DIAGNOSIS — R000 Tachycardia, unspecified: Secondary | ICD-10-CM

## 2022-04-06 DIAGNOSIS — M25473 Effusion, unspecified ankle: Secondary | ICD-10-CM | POA: Diagnosis not present

## 2022-04-06 MED ORDER — FUROSEMIDE 40 MG PO TABS: 40.0000 mg | ORAL_TABLET | Freq: Every day | ORAL | 3 refills | Status: DC | PRN

## 2022-04-06 NOTE — Assessment & Plan Note (Addendum)
Manages with lasix 40 mg PRN, reports use once a week Has consult with vascular scheduled.

## 2022-04-06 NOTE — Assessment & Plan Note (Signed)
Managed on atorvastatin 80 mg  LDL goal < 70  Repeat fasting lipids/cmp

## 2022-04-06 NOTE — Assessment & Plan Note (Signed)
On exam today Heart rate regular, tachy <115 Advised pt to monitor symptoms -- any sob, dizziness, palpitations to call office.

## 2022-04-06 NOTE — Assessment & Plan Note (Signed)
Historically Will check fasting cmp and a1c

## 2022-04-06 NOTE — Assessment & Plan Note (Signed)
Stable, rare albuterol use Continue anoro ellipta use daily

## 2022-04-06 NOTE — Assessment & Plan Note (Signed)
Stable, managed on levothyroxine 112 mcg Repeat tsh/t4

## 2022-04-06 NOTE — Assessment & Plan Note (Signed)
Chronic, well controlled Managed on amlodipine 5 mg and benazepril 20 mg Continue current medications Repeat CMP appears there was an AKI last check

## 2022-04-07 LAB — LIPID PANEL
Chol/HDL Ratio: 2.1 ratio (ref 0.0–4.4)
Cholesterol, Total: 193 mg/dL (ref 100–199)
HDL: 93 mg/dL (ref >39)
LDL Chol Calc (NIH): 87 mg/dL (ref 0–99)
Triglycerides: 72 mg/dL (ref 0–149)
VLDL Cholesterol Cal: 13 mg/dL (ref 5–40)

## 2022-04-07 LAB — COMPREHENSIVE METABOLIC PANEL
ALT: 26 IU/L (ref 0–32)
AST: 39 IU/L (ref 0–40)
Albumin/Globulin Ratio: 1.8 (ref 1.2–2.2)
Albumin: 4.5 g/dL (ref 3.8–4.8)
Alkaline Phosphatase: 85 IU/L (ref 44–121)
BUN/Creatinine Ratio: 17 (ref 12–28)
BUN: 15 mg/dL (ref 8–27)
Bilirubin Total: 0.5 mg/dL (ref 0.0–1.2)
CO2: 22 mmol/L (ref 20–29)
Calcium: 10.2 mg/dL (ref 8.7–10.3)
Chloride: 106 mmol/L (ref 96–106)
Creatinine, Ser: 0.86 mg/dL (ref 0.57–1.00)
Globulin, Total: 2.5 g/dL (ref 1.5–4.5)
Glucose: 106 mg/dL — ABNORMAL HIGH (ref 70–99)
Potassium: 5 mmol/L (ref 3.5–5.2)
Sodium: 145 mmol/L — ABNORMAL HIGH (ref 134–144)
Total Protein: 7 g/dL (ref 6.0–8.5)
eGFR: 71 mL/min/{1.73_m2} (ref >59)

## 2022-04-07 LAB — HEMOGLOBIN A1C
Est. average glucose Bld gHb Est-mCnc: 108 mg/dL
Hgb A1c MFr Bld: 5.4 % (ref 4.8–5.6)

## 2022-04-07 LAB — TSH+FREE T4
Free T4: 1.46 ng/dL (ref 0.82–1.77)
TSH: 2.07 u[IU]/mL (ref 0.450–4.500)

## 2022-04-11 ENCOUNTER — Other Ambulatory Visit (INDEPENDENT_AMBULATORY_CARE_PROVIDER_SITE_OTHER): Payer: Self-pay | Admitting: Nurse Practitioner

## 2022-04-11 DIAGNOSIS — I739 Peripheral vascular disease, unspecified: Secondary | ICD-10-CM

## 2022-04-12 ENCOUNTER — Ambulatory Visit (INDEPENDENT_AMBULATORY_CARE_PROVIDER_SITE_OTHER): Payer: Medicare HMO

## 2022-04-12 ENCOUNTER — Ambulatory Visit (INDEPENDENT_AMBULATORY_CARE_PROVIDER_SITE_OTHER): Payer: Medicare HMO | Admitting: Nurse Practitioner

## 2022-04-12 ENCOUNTER — Encounter (INDEPENDENT_AMBULATORY_CARE_PROVIDER_SITE_OTHER): Payer: Self-pay | Admitting: Nurse Practitioner

## 2022-04-12 VITALS — BP 116/73 | HR 60 | Resp 16 | Ht 67.5 in | Wt 186.8 lb

## 2022-04-12 DIAGNOSIS — I739 Peripheral vascular disease, unspecified: Secondary | ICD-10-CM

## 2022-04-12 DIAGNOSIS — E7800 Pure hypercholesterolemia, unspecified: Secondary | ICD-10-CM

## 2022-04-12 DIAGNOSIS — I10 Essential (primary) hypertension: Secondary | ICD-10-CM | POA: Diagnosis not present

## 2022-04-14 ENCOUNTER — Other Ambulatory Visit: Payer: Self-pay | Admitting: Physician Assistant

## 2022-04-14 ENCOUNTER — Encounter: Payer: Self-pay | Admitting: Physician Assistant

## 2022-04-14 DIAGNOSIS — J441 Chronic obstructive pulmonary disease with (acute) exacerbation: Secondary | ICD-10-CM

## 2022-04-14 MED ORDER — PREDNISONE 20 MG PO TABS: 20.0000 mg | ORAL_TABLET | Freq: Every day | ORAL | 0 refills | Status: DC

## 2022-04-16 ENCOUNTER — Telehealth: Payer: Medicare HMO | Admitting: Physician Assistant

## 2022-04-16 DIAGNOSIS — B9689 Other specified bacterial agents as the cause of diseases classified elsewhere: Secondary | ICD-10-CM

## 2022-04-16 DIAGNOSIS — J208 Acute bronchitis due to other specified organisms: Secondary | ICD-10-CM | POA: Diagnosis not present

## 2022-04-16 MED ORDER — DOXYCYCLINE HYCLATE 100 MG PO TABS: 100.0000 mg | ORAL_TABLET | Freq: Two times a day (BID) | ORAL | 0 refills | Status: DC

## 2022-04-16 MED ORDER — BENZONATATE 100 MG PO CAPS: 100.0000 mg | ORAL_CAPSULE | Freq: Three times a day (TID) | ORAL | 0 refills | Status: DC | PRN

## 2022-04-16 MED ORDER — PREDNISONE 20 MG PO TABS: 40.0000 mg | ORAL_TABLET | Freq: Every day | ORAL | 0 refills | Status: DC

## 2022-04-16 NOTE — Progress Notes (Signed)
We are sorry that you are not feeling well.  Here is how we plan to help!  Based on your presentation I believe you most likely have A cough due to bacteria.  When patients have a fever and a productive cough with a change in color or increased sputum production, we are concerned about bacterial bronchitis.  If left untreated it can progress to pneumonia.  If your symptoms do not improve with your treatment plan it is important that you contact your provider.   I have prescribed Doxycycline 100 mg twice a day for 7 days     In addition you may use A non-prescription cough medication called Mucinex DM: take 2 tablets every 12 hours. and A prescription cough medication called Tessalon Perles 100mg . You may take 1-2 capsules every 8 hours as needed for your cough.  Prednisone 20mg  Take 40mg  (2 tablets) daily for 5 days.  From your responses in the eVisit questionnaire you describe inflammation in the upper respiratory tract which is causing a significant cough.  This is commonly called Bronchitis and has four common causes:   Allergies Viral Infections Acid Reflux Bacterial Infection Allergies, viruses and acid reflux are treated by controlling symptoms or eliminating the cause. An example might be a cough caused by taking certain blood pressure medications. You stop the cough by changing the medication. Another example might be a cough caused by acid reflux. Controlling the reflux helps control the cough.  USE OF BRONCHODILATOR ("RESCUE") INHALERS: There is a risk from using your bronchodilator too frequently.  The risk is that over-reliance on a medication which only relaxes the muscles surrounding the breathing tubes can reduce the effectiveness of medications prescribed to reduce swelling and congestion of the tubes themselves.  Although you feel brief relief from the bronchodilator inhaler, your asthma may actually be worsening with the tubes becoming more swollen and filled with mucus.  This can  delay other crucial treatments, such as oral steroid medications. If you need to use a bronchodilator inhaler daily, several times per day, you should discuss this with your provider.  There are probably better treatments that could be used to keep your asthma under control.     HOME CARE Only take medications as instructed by your medical team. Complete the entire course of an antibiotic. Drink plenty of fluids and get plenty of rest. Avoid close contacts especially the very young and the elderly Cover your mouth if you cough or cough into your sleeve. Always remember to wash your hands A steam or ultrasonic humidifier can help congestion.   GET HELP RIGHT AWAY IF: You develop worsening fever. You become short of breath You cough up blood. Your symptoms persist after you have completed your treatment plan MAKE SURE YOU  Understand these instructions. Will watch your condition. Will get help right away if you are not doing well or get worse.    Thank you for choosing an e-visit.  Your e-visit answers were reviewed by a board certified advanced clinical practitioner to complete your personal care plan. Depending upon the condition, your plan could have included both over the counter or prescription medications.  Please review your pharmacy choice. Make sure the pharmacy is open so you can pick up prescription now. If there is a problem, you may contact your provider through and have the prescription routed to another pharmacy.  Your safety is important to . If you have drug allergies check your prescription carefully.   For the next  24 hours you can use MyChart to ask questions about today's visit, request a non-urgent call back, or ask for a work or school excuse. You will get an email in the next two days asking about your experience. I hope that your e-visit has been valuable and will speed your recovery.  I have spent 5 minutes in review of e-visit questionnaire,  review and updating patient chart, medical decision making and response to patient.   Margaretann Loveless, PA-C

## 2022-04-24 DIAGNOSIS — J449 Chronic obstructive pulmonary disease, unspecified: Secondary | ICD-10-CM

## 2022-04-24 DIAGNOSIS — E785 Hyperlipidemia, unspecified: Secondary | ICD-10-CM

## 2022-04-25 ENCOUNTER — Encounter (INDEPENDENT_AMBULATORY_CARE_PROVIDER_SITE_OTHER): Payer: Self-pay | Admitting: Nurse Practitioner

## 2022-04-25 NOTE — Progress Notes (Signed)
Subjective:    Patient ID: Penny Reed, female    DOB: October 21, 1945, 77 y.o.   MRN: [id number] Chief Complaint  Patient presents with   New Patient (Initial Visit)    Ref [facility] consult claudication     Penny Reed is a 77 year old female who is referred today by Dr. Juliann Pares regarding claudication of her lower extremities.  Initially noticed it during the summer.  She notes that she has pain in her calf and aching in both legs after walking certain distances.  She notes that currently the claudication-like symptoms are not yet enough to affect her daily activities.  She denies any rest pain or open wounds or ulcerations.  She denies any chest pain or shortness of breath any TIA or CVA-like symptoms.  Today noninvasive study shows ABI 0.90 on the right and 0.77 on the left.  The patient has biphasic tibial artery waveforms on the left with monophasic/biphasic on the right    Review of Systems  Cardiovascular:        Claudication  All other systems reviewed and are negative.      Objective:   Physical Exam Vitals reviewed.  HENT:     Head: Normocephalic.  Cardiovascular:     Rate and Rhythm: Normal rate.     Pulses:          Dorsalis pedis pulses are detected w/ Doppler on the right side and detected w/ Doppler on the left side.       Posterior tibial pulses are detected w/ Doppler on the right side and detected w/ Doppler on the left side.  Pulmonary:     Effort: Pulmonary effort is normal.  Skin:    General: Skin is warm and dry.  Neurological:     Mental Status: She is alert and oriented to person, place, and time.  Psychiatric:        Mood and Affect: Mood normal.        Behavior: Behavior normal.        Thought Content: Thought content normal.        Judgment: Judgment normal.     BP 116/73 (BP Location: Right Arm)   Pulse 60   Resp 16   Ht 5' 7.5" (1.715 m)   Wt 186 lb 12.8 oz (84.7 kg)   BMI 28.83 kg/m   Past Medical History:  Diagnosis Date    Allergic rhinitis    CAD (coronary artery disease)    Complication of anesthesia    woke up during colonoscopy   Hyperlipidemia    Hypertension    Hypothyroidism    LVH (left ventricular hypertrophy)     Social History   Socioeconomic History   Marital status: Married    Spouse name: Not on file   Number of children: 3   Years of education: Not on file   Highest education level: Associate degree: occupational, Scientist, product/process development, or vocational program  Occupational History   Not on file  Tobacco Use   Smoking status: Former    Packs/day: 0.25    Years: 25.00    Total pack years: 6.25    Types: Cigarettes    Quit date: 04/25/2005    Years since quitting: 17.0   Smokeless tobacco: Never   Tobacco comments:    Former smoker 06/28/21  Vaping Use   Vaping Use: Never used  Substance and Sexual Activity   Alcohol use: Yes    Alcohol/week: 6.0 - 14.0 standard drinks of alcohol  Types: 6 - 14 Glasses of wine per week    Comment: 2 glasses in a setting over 3-7 days a week 06/28/21   Drug use: No   Sexual activity: Not on file  Other Topics Concern   Not on file  Social History Narrative   Not on file   Social Determinants of Health   Financial Resource Strain: Low Risk  (03/16/2021)   Overall Financial Resource Strain (CARDIA)    Difficulty of Paying Living Expenses: Not hard at all  Food Insecurity: No Food Insecurity (09/04/2019)   Hunger Vital Sign    Worried About Running Out of Food in the Last Year: Never true    Ran Out of Food in the Last Year: Never true  Transportation Needs: No Transportation Needs (04/27/2020)   PRAPARE - Administrator, Civil Service (Medical): No    Lack of Transportation (Non-Medical): No  Physical Activity: Sufficiently Active (09/04/2019)   Exercise Vital Sign    Days of Exercise per Week: 6 days    Minutes of Exercise per Session: 60 min  Stress: No Stress Concern Present (09/04/2019)   [employer] of Occupational Health -  Occupational Stress Questionnaire    Feeling of Stress : Not at all  Social Connections: Moderately Integrated (09/04/2019)   Social Connection and Isolation Panel [NHANES]    Frequency of Communication with Friends and Family: More than three times a week    Frequency of Social Gatherings with Friends and Family: Three times a week    Attends Religious Services: Never    Active Member of Clubs or Organizations: Yes    Attends Banker Meetings: More than 4 times per year    Marital Status: Married  Catering manager Violence: Not At Risk (09/04/2019)   Humiliation, Afraid, Rape, and Kick questionnaire    Fear of Current or Ex-Partner: No    Emotionally Abused: No    Physically Abused: No    Sexually Abused: No    Past Surgical History:  Procedure Laterality Date   ABDOMINAL HYSTERECTOMY     has left ovary   APPENDECTOMY     ATRIAL FIBRILLATION ABLATION N/A 05/31/2021   Procedure: ATRIAL FIBRILLATION ABLATION;  Surgeon: Lanier Prude, MD;  Location: [facility] INVASIVE CV LAB;  Service: Cardiovascular;  Laterality: N/A;   CARDIAC CATHETERIZATION  09/2005   had 1 stent and 3 balloons placed   CARDIOVERSION N/A 04/02/2021   Procedure: CARDIOVERSION;  Surgeon: Meriam Sprague, MD;  Location: [facility] ENDOSCOPY;  Service: Cardiovascular;  Laterality: N/A;   CARDIOVERSION N/A 07/28/2021   Procedure: CARDIOVERSION;  Surgeon: Maisie Fus, MD;  Location: [facility] ENDOSCOPY;  Service: Cardiovascular;  Laterality: N/A;   COLONOSCOPY WITH PROPOFOL N/A 12/04/2019   Procedure: COLONOSCOPY WITH PROPOFOL;  Surgeon: Earline Mayotte, MD;  Location: [facility] ENDOSCOPY;  Service: Endoscopy;  Laterality: N/A;   CORONARY ARTERY BYPASS GRAFT     SVT ABLATION N/A 09/04/2020   Procedure: SVT ABLATION;  Surgeon: Lanier Prude, MD;  Location: [facility] INVASIVE CV LAB;  Service: Cardiovascular;  Laterality: N/A;   TONSILLECTOMY      Family History  Problem Relation Age of Onset   Diabetes Mother    Stroke  Mother        cause of death   Coronary artery disease Father    Heart attack Father    Hyperlipidemia Sister    Hypertension Sister    Breast cancer Sister 14   Cancer Sister  breast cancer   Asthma Sister    Scleroderma Sister    Thyroid disease Sister    Heart attack Brother    Cancer - Colon Paternal Grandmother    Breast cancer Maternal Aunt        30s    Allergies  Allergen Reactions   Adhesive [Tape]     Band-aids irritate skin   Azithromycin Diarrhea and Nausea And Vomiting   Eliquis [Apixaban] Swelling    Leg and ankle swelling       Latest Ref Rng & Units 07/28/2021    8:43 AM 05/11/2021    9:59 AM 03/23/2021    9:00 AM  CBC  WBC 4.0 - 10.5 K/uL 5.1  5.8  4.5   Hemoglobin 12.0 - 15.0 g/dL 16.1  09.6  04.5   Hematocrit 36.0 - 46.0 % 39.5  40.3  39.7   Platelets 150 - 400 K/uL 159  216  203       CMP     Component Value Date/Time   NA 145 (H) 04/06/2022 0954   K 5.0 04/06/2022 0954   CL 106 04/06/2022 0954   CO2 22 04/06/2022 0954   GLUCOSE 106 (H) 04/06/2022 0954   GLUCOSE 126 (H) 07/28/2021 0843   BUN 15 04/06/2022 0954   CREATININE 0.86 04/06/2022 0954   CALCIUM 10.2 04/06/2022 0954   PROT 7.0 04/06/2022 0954   ALBUMIN 4.5 04/06/2022 0954   AST 39 04/06/2022 0954   ALT 26 04/06/2022 0954   ALKPHOS 85 04/06/2022 0954   BILITOT 0.5 04/06/2022 0954   GFRNONAA 56 (L) 07/28/2021 0843   GFRAA 81 05/29/2020 1125     VAS [place] ABI WITH/WO TBI  Result Date: 04/21/2022  LOWER EXTREMITY DOPPLER STUDY Patient Name:  Penny Reed  Date of Exam:   04/12/2022 Medical Rec #: [id number]         Accession #:    [id number] Date of Birth: [birth year]         Patient Gender: F Patient Age:   46 years Exam Location:  [facility] Procedure:      VAS [place] ABI WITH/WO TBI Referring Phys: Sheppard Plumber --------------------------------------------------------------------------------   Performing Technologist: Debbe Bales RVS  Examination Guidelines: A  complete evaluation includes at minimum, Doppler waveform signals and systolic blood pressure reading at the level of bilateral brachial, anterior tibial, and posterior tibial arteries, when vessel segments are accessible. Bilateral testing is considered an integral part of a complete examination. Photoelectric Plethysmograph (PPG) waveforms and toe systolic pressure readings are included as required and additional duplex testing as needed. Limited examinations for reoccurring indications may be performed as noted.  ABI Findings: +---------+------------------+-----+----------+--------+ Right    Rt Pressure (mmHg)IndexWaveform  Comment  +---------+------------------+-----+----------+--------+ Brachial 118                                       +---------+------------------+-----+----------+--------+ ATA      97                0.81 monophasic         +---------+------------------+-----+----------+--------+ PTA      108               0.90 biphasic           +---------+------------------+-----+----------+--------+ Great Toe100               0.83 Normal             +---------+------------------+-----+----------+--------+ +---------+------------------+-----+--------+-------+  Left     Lt Pressure (mmHg)IndexWaveformComment +---------+------------------+-----+--------+-------+ Brachial 120                                    +---------+------------------+-----+--------+-------+ ATA      77                0.64 biphasic        +---------+------------------+-----+--------+-------+ PTA      92                0.77 biphasic        +---------+------------------+-----+--------+-------+ Great Toe106               0.88 Normal          +---------+------------------+-----+--------+-------+ +-------+-----------+-----------+------------+------------+ ABI/TBIToday's ABIToday's TBIPrevious ABIPrevious TBI +-------+-----------+-----------+------------+------------+ Right  .90         .83                                 +-------+-----------+-----------+------------+------------+ Left   .77        .88                                 +-------+-----------+-----------+------------+------------+  Summary: Right: Resting right ankle-brachial index indicates mild right lower extremity arterial disease. The right toe-brachial index is normal. Imaging and Waveforms obtained of the Right Posterior Tibial Artery, Anterior Tibial Artery and Peroneal Artery; Calcified Plaque seen in All Vessels mentioned. Disturbed flow seen in the Right Anterior Tibial Artery. Left: Resting left ankle-brachial index indicates moderate left lower extremity arterial disease. The left toe-brachial index is normal. Imaging and Waveforms obtained of the Left Posterior Tibial Artery, Anterior Tibial Artery and Peroneal Artery; Calcified Plaque seen in All Vessels mentioned.  *See table(s) above for measurements and observations.  Electronically signed by Leotis Pain MD on 04/21/2022 at 9:53:47 AM.    Final        Assessment & Plan:   1. PAD (peripheral artery disease) ([place]) I had a long discussion with the patient regarding claudication and peripheral arterial disease and treatment options.  Currently the patient does have claudication-like symptoms but they are not lifestyle limiting.  We discussed the role of activity and walking and promoting collateralization.  Because the patient currently does not have rest pain or ulcerations there is certainly a reasonable plan.  While the patient return in 6 months with noninvasive studies however sooner if she begins to have worsening claudication or symptoms of progression of disease.  2. Pure hypercholesterolemia Continue statin as ordered and reviewed, no changes at this time  3. Primary hypertension Continue antihypertensive medications as already ordered, these medications have been reviewed and there are no changes at this time.   Current Outpatient  Medications on File Prior to Visit  Medication Sig Dispense Refill   albuterol (PROAIR HFA) 108 (90 BASE) MCG/ACT inhaler Inhale 2 puffs into the lungs every 6 (six) hours as needed for wheezing or shortness of breath. 3 Inhaler 3   amLODipine (NORVASC) 5 MG tablet TAKE 1 TABLET BY MOUTH EVERY DAY 90 tablet 4   ANORO ELLIPTA 62.5-25 MCG/ACT AEPB INHALE 1 PUFF BY MOUTH EVERY DAY 60 each 7   atorvastatin (LIPITOR) 80 MG tablet TAKE 1 TABLET BY MOUTH EVERY DAY 90 tablet 3   Azelastine HCl 137 MCG/SPRAY  SOLN SPRAY 2 SPRAYS INTO EACH NOSTRIL EVERY DAY 8 mL 3   benazepril (LOTENSIN) 20 MG tablet Take 1 tablet (20 mg total) by mouth daily. 90 tablet 3   Calcium Carbonate (CALCIUM 600 PO) Take 600 mg by mouth in the morning and at bedtime.     cetirizine (ZYRTEC) 10 MG tablet Take 10 mg by mouth daily as needed for allergies.     cholecalciferol (VITAMIN D3) 25 MCG (1000 UNIT) tablet Take 1,000 Units by mouth daily.     furosemide (LASIX) 40 MG tablet Take 1 tablet (40 mg total) by mouth daily as needed (swelling/weight gain). 90 tablet 3   levothyroxine (SYNTHROID) 112 MCG tablet TAKE 1 TABLET BY MOUTH SIX DAYS PER WEEK. TAKE 1 AND 1/2 TABLET BY MOUTH ONE DAY PER WEEK (WEDNESDAYS) 100 tablet 2   Multiple Vitamin (MULTIVITAMIN) tablet Take 1 tablet by mouth daily. Standard Process     mupirocin ointment (BACTROBAN) 2 % Apply 1 Application topically 2 (two) times daily. 22 g 0   nitroGLYCERIN (NITROSTAT) 0.4 MG SL tablet Place 0.4 mg under the tongue every 5 (five) minutes as needed for chest pain.      Omega-3 Fatty Acids (FISH OIL) 1000 MG CAPS Take 3,000 mg by mouth at bedtime.     XARELTO 20 MG TABS tablet TAKE 1 TABLET BY MOUTH DAILY WITH SUPPER. STOP ELIQUIS 90 tablet 1   zolpidem (AMBIEN) 5 MG tablet TAKE 1 TABLET BY MOUTH AT BEDTIME AS NEEDED FOR SLEEP 30 tablet 3   No current facility-administered medications on file prior to visit.    There are no Patient Instructions on file for this  visit. No follow-ups on file.   Kris Hartmann, NP

## 2022-05-14 DIAGNOSIS — J449 Chronic obstructive pulmonary disease, unspecified: Secondary | ICD-10-CM | POA: Diagnosis not present

## 2022-05-14 DIAGNOSIS — M1990 Unspecified osteoarthritis, unspecified site: Secondary | ICD-10-CM | POA: Diagnosis not present

## 2022-05-14 DIAGNOSIS — R32 Unspecified urinary incontinence: Secondary | ICD-10-CM | POA: Diagnosis not present

## 2022-05-14 DIAGNOSIS — J309 Allergic rhinitis, unspecified: Secondary | ICD-10-CM | POA: Diagnosis not present

## 2022-05-14 DIAGNOSIS — E785 Hyperlipidemia, unspecified: Secondary | ICD-10-CM | POA: Diagnosis not present

## 2022-05-14 DIAGNOSIS — K5900 Constipation, unspecified: Secondary | ICD-10-CM | POA: Diagnosis not present

## 2022-05-14 DIAGNOSIS — I252 Old myocardial infarction: Secondary | ICD-10-CM | POA: Diagnosis not present

## 2022-05-14 DIAGNOSIS — E039 Hypothyroidism, unspecified: Secondary | ICD-10-CM | POA: Diagnosis not present

## 2022-05-14 DIAGNOSIS — Z008 Encounter for other general examination: Secondary | ICD-10-CM | POA: Diagnosis not present

## 2022-05-14 DIAGNOSIS — I4891 Unspecified atrial fibrillation: Secondary | ICD-10-CM | POA: Diagnosis not present

## 2022-05-14 DIAGNOSIS — I509 Heart failure, unspecified: Secondary | ICD-10-CM | POA: Diagnosis not present

## 2022-05-14 DIAGNOSIS — I25119 Atherosclerotic heart disease of native coronary artery with unspecified angina pectoris: Secondary | ICD-10-CM | POA: Diagnosis not present

## 2022-05-14 DIAGNOSIS — K219 Gastro-esophageal reflux disease without esophagitis: Secondary | ICD-10-CM | POA: Diagnosis not present

## 2022-05-19 ENCOUNTER — Ambulatory Visit (INDEPENDENT_AMBULATORY_CARE_PROVIDER_SITE_OTHER): Payer: Medicare HMO | Admitting: Physician Assistant

## 2022-05-19 ENCOUNTER — Encounter: Payer: Self-pay | Admitting: Physician Assistant

## 2022-05-19 VITALS — BP 120/50 | HR 63 | Ht 67.0 in | Wt 179.7 lb

## 2022-05-19 DIAGNOSIS — E039 Hypothyroidism, unspecified: Secondary | ICD-10-CM

## 2022-05-19 DIAGNOSIS — J449 Chronic obstructive pulmonary disease, unspecified: Secondary | ICD-10-CM | POA: Diagnosis not present

## 2022-05-19 DIAGNOSIS — E7800 Pure hypercholesterolemia, unspecified: Secondary | ICD-10-CM

## 2022-05-19 MED ORDER — ATORVASTATIN CALCIUM 80 MG PO TABS: 80.0000 mg | ORAL_TABLET | Freq: Every day | ORAL | 3 refills | Status: DC

## 2022-05-19 MED ORDER — LEVOTHYROXINE SODIUM 112 MCG PO TABS: ORAL_TABLET | ORAL | 2 refills | Status: AC

## 2022-05-19 NOTE — Assessment & Plan Note (Signed)
Managed on atorvastatin 80 mg, LDL < 100, refilled

## 2022-05-19 NOTE — Assessment & Plan Note (Signed)
Stable ,refilled levothyroxine

## 2022-05-19 NOTE — Progress Notes (Signed)
I,Sha'taria Tyson,acting as a Education administrator for [employer], PA-C.,have documented all relevant documentation on the behalf of Penny Kirschner, PA-C,as directed by  Penny Kirschner, PA-C while in the presence of Penny Kirschner, PA-C.   Established patient visit   Patient: Penny Reed   DOB: Feb 09, 1946   77 y.o. Female  MRN: [id number] Visit Date: 05/19/2022  Today's healthcare provider: [name], PA-C   Cc. HLD f/u, SOB  Subjective    HPI   Pt reports having a copd exacerbation at the end of last month-- since then harder to catch breath with activity. Denies using albuterol inhaler frequently.  Lipid/Cholesterol, Follow-up  Last lipid panel Other pertinent labs  Lab Results  Component Value Date   CHOL 193 04/06/2022   HDL 93 04/06/2022   LDLCALC 87 04/06/2022   TRIG 72 04/06/2022   CHOLHDL 2.1 04/06/2022   Lab Results  Component Value Date   ALT 26 04/06/2022   AST 39 04/06/2022   PLT 159 07/28/2021   TSH 2.070 04/06/2022     She was last seen for this 7 months ago.  Management since that visit includes continue current treatment.  She reports excellent compliance with treatment. She is not having side effects.   Symptoms: No chest pain No chest pressure/discomfort  No dyspnea No lower extremity edema  Yes numbness or tingling of extremity No orthopnea  No palpitations No paroxysmal nocturnal dyspnea  No speech difficulty No syncope   Current diet: well balanced Current exercise: cardiovascular workout on exercise equipment and weightlifting  The ASCVD Risk score (Arnett DK, et al., 2019) failed to calculate for the following reasons:   The patient has a prior MI or stroke diagnosis  ---------------------------------------------------------------------------------------------------   Medications: Outpatient Medications Prior to Visit  Medication Sig   albuterol (PROAIR HFA) 108 (90 BASE) MCG/ACT inhaler Inhale 2 puffs into the lungs every 6  (six) hours as needed for wheezing or shortness of breath.   amLODipine (NORVASC) 5 MG tablet TAKE 1 TABLET BY MOUTH EVERY DAY   ANORO ELLIPTA 62.5-25 MCG/ACT AEPB INHALE 1 PUFF BY MOUTH EVERY DAY   Azelastine HCl 137 MCG/SPRAY SOLN SPRAY 2 SPRAYS INTO EACH NOSTRIL EVERY DAY   benazepril (LOTENSIN) 20 MG tablet Take 1 tablet (20 mg total) by mouth daily.   Calcium Carbonate (CALCIUM 600 PO) Take 600 mg by mouth in the morning and at bedtime.   cetirizine (ZYRTEC) 10 MG tablet Take 10 mg by mouth daily as needed for allergies.   cholecalciferol (VITAMIN D3) 25 MCG (1000 UNIT) tablet Take 1,000 Units by mouth daily.   furosemide (LASIX) 40 MG tablet Take 1 tablet (40 mg total) by mouth daily as needed (swelling/weight gain).   Multiple Vitamin (MULTIVITAMIN) tablet Take 1 tablet by mouth daily. Standard Process   nitroGLYCERIN (NITROSTAT) 0.4 MG SL tablet Place 0.4 mg under the tongue every 5 (five) minutes as needed for chest pain.    Omega-3 Fatty Acids (FISH OIL) 1000 MG CAPS Take 3,000 mg by mouth at bedtime.   XARELTO 20 MG TABS tablet TAKE 1 TABLET BY MOUTH DAILY WITH SUPPER. STOP ELIQUIS   zolpidem (AMBIEN) 5 MG tablet TAKE 1 TABLET BY MOUTH AT BEDTIME AS NEEDED FOR SLEEP   [DISCONTINUED] atorvastatin (LIPITOR) 80 MG tablet TAKE 1 TABLET BY MOUTH EVERY DAY   [DISCONTINUED] levothyroxine (SYNTHROID) 112 MCG tablet TAKE 1 TABLET BY MOUTH SIX DAYS PER WEEK. TAKE 1 AND 1/2 TABLET BY MOUTH ONE DAY PER WEEK (  WEDNESDAYS)   [DISCONTINUED] benzonatate (TESSALON) 100 MG capsule Take 1 capsule (100 mg total) by mouth 3 (three) times daily as needed.   [DISCONTINUED] doxycycline (VIBRA-TABS) 100 MG tablet Take 1 tablet (100 mg total) by mouth 2 (two) times daily.   [DISCONTINUED] mupirocin ointment (BACTROBAN) 2 % Apply 1 Application topically 2 (two) times daily.   [DISCONTINUED] predniSONE (DELTASONE) 20 MG tablet Take 2 tablets (40 mg total) by mouth daily with breakfast.   No facility-administered  medications prior to visit.    Review of Systems  Constitutional:  Negative for fatigue and fever.  Respiratory:  Positive for cough and shortness of breath.   Cardiovascular:  Negative for chest pain and leg swelling.  Gastrointestinal:  Negative for abdominal pain.  Neurological:  Negative for dizziness and headaches.       Objective    Blood pressure (!) 120/50, pulse 63, height 5\' 7"  (1.702 m), weight 179 lb 11.2 oz (81.5 kg), SpO2 100 %.   Physical Exam Vitals reviewed.  Constitutional:      Appearance: She is not ill-appearing.  HENT:     Head: Normocephalic.  Eyes:     Conjunctiva/sclera: Conjunctivae normal.  Cardiovascular:     Rate and Rhythm: Normal rate.  Pulmonary:     Effort: Pulmonary effort is normal. No respiratory distress.  Neurological:     General: No focal deficit present.     Mental Status: She is alert and oriented to person, place, and time.  Psychiatric:        Mood and Affect: Mood normal.        Behavior: Behavior normal.     No results found for any visits on 05/19/22.  Assessment & Plan     Problem List Items Addressed This Visit       Respiratory   COPD, moderate ([place])    Continue anoro ellipta  Advised using albuterol more often --q 4 hours prn  If no improvement recommend referral back to pulm        Endocrine   Acquired hypothyroidism    Stable ,refilled levothyroxine      Relevant Medications   levothyroxine (SYNTHROID) 112 MCG tablet     Other   HLD (hyperlipidemia) - Primary    Managed on atorvastatin 80 mg, LDL < 100, refilled      Relevant Medications   atorvastatin (LIPITOR) 80 MG tablet     Return if symptoms worsen or fail to improve, at next scheduled appt.      I, Penny Kirschner, PA-C have reviewed all documentation for this visit. The documentation on  05/19/22  for the exam, diagnosis, procedures, and orders are all accurate and complete.  Penny Kirschner, PA-C [facility] [address] [place], [place], [address] Office: [phone] Fax: [place]

## 2022-05-19 NOTE — Assessment & Plan Note (Signed)
Continue anoro ellipta  Advised using albuterol more often --q 4 hours prn  If no improvement recommend referral back to pulm

## 2022-06-03 DIAGNOSIS — R69 Illness, unspecified: Secondary | ICD-10-CM | POA: Diagnosis not present

## 2022-06-28 ENCOUNTER — Telehealth: Payer: Self-pay

## 2022-06-28 NOTE — Telephone Encounter (Signed)
Copied from [place] [phone]. Topic: Referral - Request for Referral >> Jun 28, 2022  2:53 PM Erskine Squibb wrote: Has patient seen PCP for this complaint? Yes.   Referral for which specialty: Pulmonology Preferred provider/office: Pulmonologist Reason for referral: Breathing issues  The patient is having to use her albuterol everyday twice daily as well as her preventative so she would like a referral to see a pulmonologist. She saw one years ago but doesn't remember the name or number. Please assist patient further

## 2022-06-28 NOTE — Progress Notes (Signed)
Care Coordination Pharmacy Assistant   Name: Penny Reed  MRN: [id number] DOB: [birth year]  Reason for Encounter: HTN Disease State Call   Recent office visits:  05/19/2022 [name], PA-C (PCP Office Visit) for Pure Hypercholesterolemia- Stopped: Benzonatate 100 mg, Doxycycline 100 mg, Mupirocin 2%, Prednisone 40 mg, No orders placed, No follow-up noted  04/06/2022 [name], PA-C (PCP Office Visit) for Follow-up- No medication changes noted, Lab orders placed, Patient to follow-up in 6 months  Recent consult visits:  04/12/2022 Eulogio Ditch, NP (Vascular Surgery) for Initial Visit- No medication changes noted, No orders placed, No follow-up noted  Hospital visits:  None in previous 6 months  Medications: Outpatient Encounter Medications as of 06/28/2022  Medication Sig   albuterol (PROAIR HFA) 108 (90 BASE) MCG/ACT inhaler Inhale 2 puffs into the lungs every 6 (six) hours as needed for wheezing or shortness of breath.   amLODipine (NORVASC) 5 MG tablet TAKE 1 TABLET BY MOUTH EVERY DAY   ANORO ELLIPTA 62.5-25 MCG/ACT AEPB INHALE 1 PUFF BY MOUTH EVERY DAY   atorvastatin (LIPITOR) 80 MG tablet Take 1 tablet (80 mg total) by mouth daily.   Azelastine HCl 137 MCG/SPRAY SOLN SPRAY 2 SPRAYS INTO EACH NOSTRIL EVERY DAY   benazepril (LOTENSIN) 20 MG tablet Take 1 tablet (20 mg total) by mouth daily.   Calcium Carbonate (CALCIUM 600 PO) Take 600 mg by mouth in the morning and at bedtime.   cetirizine (ZYRTEC) 10 MG tablet Take 10 mg by mouth daily as needed for allergies.   cholecalciferol (VITAMIN D3) 25 MCG (1000 UNIT) tablet Take 1,000 Units by mouth daily.   furosemide (LASIX) 40 MG tablet Take 1 tablet (40 mg total) by mouth daily as needed (swelling/weight gain).   levothyroxine (SYNTHROID) 112 MCG tablet TAKE 1 TABLET BY MOUTH SIX DAYS PER WEEK. TAKE 1 AND 1/2 TABLET BY MOUTH ONE DAY PER WEEK [name])   Multiple Vitamin (MULTIVITAMIN) tablet Take 1 tablet by mouth daily.  Standard Process   nitroGLYCERIN (NITROSTAT) 0.4 MG SL tablet Place 0.4 mg under the tongue every 5 (five) minutes as needed for chest pain.    Omega-3 Fatty Acids (FISH OIL) 1000 MG CAPS Take 3,000 mg by mouth at bedtime.   XARELTO 20 MG TABS tablet TAKE 1 TABLET BY MOUTH DAILY WITH SUPPER. STOP ELIQUIS   zolpidem (AMBIEN) 5 MG tablet TAKE 1 TABLET BY MOUTH AT BEDTIME AS NEEDED FOR SLEEP   No facility-administered encounter medications on file as of 06/28/2022.   Reviewed chart prior to disease state call. Spoke with patient regarding BP  Recent Office Vitals: BP Readings from Last 3 Encounters:  05/19/22 (!) 120/50  04/12/22 116/73  04/06/22 136/78   Pulse Readings from Last 3 Encounters:  05/19/22 63  04/12/22 60  04/06/22 (!) 110    Wt Readings from Last 3 Encounters:  05/19/22 179 lb 11.2 oz (81.5 kg)  04/12/22 186 lb 12.8 oz (84.7 kg)  04/06/22 183 lb 3.2 oz (83.1 kg)    Kidney Function Lab Results  Component Value Date/Time   CREATININE 0.86 04/06/2022 09:54 AM   CREATININE 1.05 (H) 07/28/2021 08:43 AM   GFRNONAA 56 (L) 07/28/2021 08:43 AM   GFRAA 81 05/29/2020 11:25 AM      Latest Ref Rng & Units 04/06/2022    9:54 AM 07/28/2021    8:43 AM 05/11/2021    9:59 AM  BMP  Glucose 70 - 99 mg/dL 106  126  101   BUN 8 -  27 mg/dL '15  24  16   '$ Creatinine 0.57 - 1.00 mg/dL 0.86  1.05  0.76   BUN/Creat Ratio 12 - '28 17   21   '$ Sodium 134 - 144 mmol/L 145  138  140   Potassium 3.5 - 5.2 mmol/L 5.0  3.9  4.8   Chloride 96 - 106 mmol/L 106  103  101   CO2 20 - 29 mmol/L '22  24  23   '$ Calcium 8.7 - 10.3 mg/dL 10.2  9.6  9.8    Current antihypertensive regimen:  Amlodipine 5 mg daily  Furosemide 40 mg 1/2 tablet daily as needed  Benazepril 20 mg daily   How often are you checking your Blood Pressure? daily  Current home BP readings: 147/54, 136/547, 142/54  What recent interventions/DTPs have been made by any provider to improve Blood Pressure control since last CPP Visit:  None ID  Any recent hospitalizations or ED visits since last visit with CPP? No What diet changes have been made to improve Blood Pressure Control?  None ID What exercise is being done to improve your Blood Pressure Control?  Per patient she is trying to exercise more as she has been diagnosed with PAD. Patient denies swelling but did advise she is have leg pain. Patient has seen Vascular Surgery for this issue, but she stated the specialist advised her to exercise more which she is trying.  Adherence Review: Is the patient currently on ACE/ARB medication? Yes Does the patient have >5 day gap between last estimated fill dates? No   I spoke with the patient and she stated that her blood pressure has been okay, but she is dealing with a new issue that's causing her to have pain in her legs so she feels this may be causing her elevated blood pressure readings. Patient also stated she was very sick a few months ago, and since that time her breathing has been worse. Patient is currently using her Anoro Inhaler as directed, but she is having to use her Albuterol inhaler twice daily to assist with her shortness of breath. Patient would like to be referred to Pulmonary for further evaluation. I advised patient I would speak with CPP to see if he can speak with PCP about the referral. Patient advised that I or the providers office would contact her back with an answer regarding her referral.  I sent a message to Junius Argyle, CPP requesting him to send a task to PCP to see if she would be okay with ordering the Pulmonary Referral for the patient. Per CPP he would like me to see if patient needs an acute visit first.  I contacted the office and spoke with Corene Cornea who took the message and will send the message back to PCP requesting a Pulmonary referral for the patient.   Patient has a follow-up with CPP on 08/29/2022 @ 1100.  Lynann Bologna, CPA/CMA Clinical Pharmacist Assistant Phone: [phone]

## 2022-06-29 ENCOUNTER — Other Ambulatory Visit: Payer: Self-pay | Admitting: Physician Assistant

## 2022-06-29 DIAGNOSIS — J449 Chronic obstructive pulmonary disease, unspecified: Secondary | ICD-10-CM

## 2022-07-01 ENCOUNTER — Ambulatory Visit: Payer: Medicare HMO | Admitting: Student in an Organized Health Care Education/Training Program

## 2022-07-01 ENCOUNTER — Encounter: Payer: Self-pay | Admitting: Student in an Organized Health Care Education/Training Program

## 2022-07-01 VITALS — BP 130/60 | HR 73 | Temp 97.8°F | Ht 67.0 in | Wt 181.6 lb

## 2022-07-01 DIAGNOSIS — R0602 Shortness of breath: Secondary | ICD-10-CM

## 2022-07-01 NOTE — Progress Notes (Signed)
Synopsis: Referred in for shortness of breath by [name], PA-C  Assessment & Plan:   1. Shortness of breath  Patient was empirically started on an inhaler many years ago and also has a significant cardiac history (CAD, HFpEF, PAD, Afib ablation, SVT). Exam today shows clear lungs and her lower extremities are not swollen, thou she does report edema for which she takes a diuretic. Cardiac auscultation reveals a systolic murmur over the left upper sternal border while previous TTE had shown septal hypertrophy and grade II diastolic dysfunction.  While the patient has been prescribed LABA/LAMA therapy in the past, her PFT's were normal then and are not consistent with COPD. I will be repeating the PFT's following this visit to rule out restrictive lung disease, but have no reason to suspect the development of COPD given she has not smoked in over 17 years. I am more concerned for a cardiac etiology behind her dyspnea. She has an extensive cardiac history and the combination of murmur with septal wall thickening is concerning to me for LVOT obstruction. Other considerations include worsening HFpEF, valvulopathy, or restrictive lung disease as a complication from her ablation. I will repeat the echocardiogram and have asked the patient to follow up with her cardiologist. Should the cardiac workup not be conclusive, I would consider cardiopulmonary exercise testing (CPET) to diagnose the etiology behind the patient's dyspnea.  - Pulmonary Function Test [facility] Only; Future - ECHOCARDIOGRAM COMPLETE; Future   Return in about 6 weeks (around 08/12/2022).  I spent 60 minutes caring for this patient today, including preparing to see the patient, obtaining a medical history , reviewing a separately obtained history, performing a medically appropriate examination and/or evaluation, counseling and educating the patient/family/caregiver, ordering medications, tests, or procedures, and documenting clinical  information in the electronic health record  Armando Reichert, MD [facility] 07/01/2022 9:31 AM    End of visit medications:  No orders of the defined types were placed in this encounter.    Current Outpatient Medications:    albuterol (PROAIR HFA) 108 (90 BASE) MCG/ACT inhaler, Inhale 2 puffs into the lungs every 6 (six) hours as needed for wheezing or shortness of breath., Disp: 3 Inhaler, Rfl: 3   amLODipine (NORVASC) 5 MG tablet, TAKE 1 TABLET BY MOUTH EVERY DAY, Disp: 90 tablet, Rfl: 4   ANORO ELLIPTA 62.5-25 MCG/ACT AEPB, INHALE 1 PUFF BY MOUTH EVERY DAY, Disp: 60 each, Rfl: 7   atorvastatin (LIPITOR) 80 MG tablet, Take 1 tablet (80 mg total) by mouth daily., Disp: 90 tablet, Rfl: 3   Azelastine HCl 137 MCG/SPRAY SOLN, SPRAY 2 SPRAYS INTO EACH NOSTRIL EVERY DAY, Disp: 8 mL, Rfl: 3   benazepril (LOTENSIN) 20 MG tablet, Take 1 tablet (20 mg total) by mouth daily., Disp: 90 tablet, Rfl: 3   Calcium Carbonate (CALCIUM 600 PO), Take 600 mg by mouth in the morning and at bedtime., Disp: , Rfl:    cetirizine (ZYRTEC) 10 MG tablet, Take 10 mg by mouth daily as needed for allergies., Disp: , Rfl:    cholecalciferol (VITAMIN D3) 25 MCG (1000 UNIT) tablet, Take 1,000 Units by mouth daily., Disp: , Rfl:    furosemide (LASIX) 40 MG tablet, Take 1 tablet (40 mg total) by mouth daily as needed (swelling/weight gain)., Disp: 90 tablet, Rfl: 3   levothyroxine (SYNTHROID) 112 MCG tablet, TAKE 1 TABLET BY MOUTH SIX DAYS PER WEEK. TAKE 1 AND 1/2 TABLET BY MOUTH ONE DAY PER WEEK [facility]), Disp: 100 tablet,  Rfl: 2   Multiple Vitamin (MULTIVITAMIN) tablet, Take 1 tablet by mouth daily. Standard Process, Disp: , Rfl:    nitroGLYCERIN (NITROSTAT) 0.4 MG SL tablet, Place 0.4 mg under the tongue every 5 (five) minutes as needed for chest pain. , Disp: , Rfl:    Omega-3 Fatty Acids (FISH OIL) 1000 MG CAPS, Take 3,000 mg by mouth at bedtime., Disp: , Rfl:    XARELTO 20 MG TABS tablet, TAKE 1  TABLET BY MOUTH DAILY WITH SUPPER. STOP ELIQUIS, Disp: 90 tablet, Rfl: 1   zolpidem (AMBIEN) 5 MG tablet, TAKE 1 TABLET BY MOUTH AT BEDTIME AS NEEDED FOR SLEEP, Disp: 30 tablet, Rfl: 3   Subjective:   PATIENT ID: Penny Reed GENDER: female DOB: 1945/05/08, MRN: [id number]  Chief Complaint  Patient presents with   pulmonary consult    Hx of COPD. SOB with exertion, wheezing and non prod cough. URI 12/23-sx have lingered since      HPI  Pleasant 77 year old female presenting to clinic for the evaluation of shortness of breath.   She reports symptoms of dyspnea that have worsened over the past few months. She's experienced dyspnea before but this was mostly controlled. She was seen by pulmonology in our clinic in 2016 and underwent PFT's at the time (mostly normal) and subsequently started on Anoro Ellipta. She feels that helped control her symptoms. Following this, she was seen again in 2018 and not since.    She noticed a worsening in her symptoms over the past few months, starting around Christmas. She is a very active person and participates in an exercise program (3 times a week with a trainer; uses an elliptical and lifts weights.) The same exercises have been leaving her winded. She also reports an associated fluttering sensation in her chest/palpitations with the exertional dyspnea. She has an occasional cough in the morning which occasionally produces some clear sputum.  She does not have wheezing throughout the day but does sometimes feel there is a whistling sound in her chest in the morning.  Patient is maintained on furosemide that she takes daily secondary to lower extremity edema (ankle edema). She is compliant with Anoro Ellipta and uses the albuterol occasional, with some improvement.  Patient's medical history is notable for CAD (s/p PCI and CABG), HFpEF and Afib/flutter (s/p ablation) for which she follows with cardiology. She was last seen by Dr. Clayborn Bigness in November of  2023. She had an SVT ablation in May of 2022, and Afib/flutter ablation in February of 2023. She also has PAD. She is maintained on Xarelto daily.  Patient is a former smoker  (0.25 packs per day for 25 years, with 5 to 10 pack years of smoking history, quit in 2007). 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  Ancillary information including prior medications, full medical/surgical/family/social histories, and PFTs (when available) are listed below and have been reviewed.   Review of Systems  Constitutional:  Negative for chills, fever and malaise/fatigue.  Respiratory:  Positive for cough and shortness of breath. Negative for hemoptysis, sputum production and wheezing.   Cardiovascular:  Positive for palpitations and leg swelling. Negative for chest pain.  Skin:  Negative for rash.     Objective:   Vitals:   07/01/22 0845  BP: 130/60  Pulse: 73  Temp: 97.8 F (36.6 C)  TempSrc: Temporal  SpO2: 100%  Weight: 181 lb 9.6 oz (82.4 kg)  Height: '5\' 7"'$  (1.702 m)   100% on RA  BMI Readings from Last 3 Encounters:  07/01/22 28.44 kg/m  05/19/22 28.14 kg/m  04/12/22 28.83 kg/m   Wt Readings from Last 3 Encounters:  07/01/22 181 lb 9.6 oz (82.4 kg)  05/19/22 179 lb 11.2 oz (81.5 kg)  04/12/22 186 lb 12.8 oz (84.7 kg)    Physical Exam Constitutional:      General: She is not in acute distress.    Appearance: Normal appearance. She is not ill-appearing.  HENT:     Head: Normocephalic.     Nose: Nose normal.     Mouth/Throat:     Mouth: Mucous membranes are moist.  Cardiovascular:     Rate and Rhythm: Normal rate and regular rhythm.     Pulses: Normal pulses.     Heart sounds: Murmur (systolic ejection over the left upper sternal border, worse with Valsalva) heard.  Pulmonary:     Effort: Pulmonary effort is normal.     Breath sounds: Normal breath sounds. No wheezing, rhonchi or rales.  Abdominal:     Palpations: Abdomen is soft.  Musculoskeletal:      Right lower leg: No edema.     Left lower leg: No edema.  Neurological:     Mental Status: She is alert.     Ancillary Information    Past Medical History:  Diagnosis Date   Allergic rhinitis    CAD (coronary artery disease)    Complication of anesthesia    woke up during colonoscopy   Hyperlipidemia    Hypertension    Hypothyroidism    LVH (left ventricular hypertrophy)      Family History  Problem Relation Age of Onset   Diabetes Mother    Stroke Mother        cause of death   Coronary artery disease Father    Heart attack Father    Hyperlipidemia Sister    Hypertension Sister    Breast cancer Sister 14   Cancer Sister        breast cancer   Asthma Sister    Scleroderma Sister    Thyroid disease Sister    Heart attack Brother    Cancer - Colon Paternal Grandmother    Breast cancer Maternal Aunt        40s     Past Surgical History:  Procedure Laterality Date   ABDOMINAL HYSTERECTOMY     has left ovary   APPENDECTOMY     ATRIAL FIBRILLATION ABLATION N/A 05/31/2021   Procedure: ATRIAL FIBRILLATION ABLATION;  Surgeon: Vickie Epley, MD;  Location: [place] [facility];  Service: Cardiovascular;  Laterality: N/A;   CARDIAC CATHETERIZATION  09/2005   had 1 stent and 3 balloons placed   CARDIOVERSION N/A 04/02/2021   Procedure: CARDIOVERSION;  Surgeon: Freada Bergeron, MD;  Location: [facility] ENDOSCOPY;  Service: Cardiovascular;  Laterality: N/A;   CARDIOVERSION N/A 07/28/2021   Procedure: CARDIOVERSION;  Surgeon: Janina Mayo, MD;  Location: [place];  Service: Cardiovascular;  Laterality: N/A;   COLONOSCOPY WITH PROPOFOL N/A 12/04/2019   Procedure: COLONOSCOPY WITH PROPOFOL;  Surgeon: Robert Bellow, MD;  Location: [facility] ENDOSCOPY;  Service: Endoscopy;  Laterality: N/A;   CORONARY ARTERY BYPASS GRAFT     SVT ABLATION N/A 09/04/2020   Procedure: SVT ABLATION;  Surgeon: Vickie Epley, MD;  Location: [place] [facility];  Service: Cardiovascular;   Laterality: N/A;   TONSILLECTOMY      Social History   Socioeconomic History   Marital status: Married  Spouse name: Not on file   Number of children: 3   Years of education: Not on file   Highest education level: Associate degree: occupational, technical, or vocational program  Occupational History   Not on file  Tobacco Use   Smoking status: Former    Packs/day: 0.25    Years: 25.00    Total pack years: 6.25    Types: Cigarettes    Quit date: 04/25/2005    Years since quitting: 17.1   Smokeless tobacco: Never   Tobacco comments:    Former smoker 06/28/21  Vaping Use   Vaping Use: Never used  Substance and Sexual Activity   Alcohol use: Yes    Alcohol/week: 6.0 - 14.0 standard drinks of alcohol    Types: 6 - 14 Glasses of wine per week    Comment: 2 glasses in a setting over 3-7 days a week 06/28/21   Drug use: No   Sexual activity: Not on file  Other Topics Concern   Not on file  Social History Narrative   Not on file   Social Determinants of Health   Financial Resource Strain: Low Risk  (03/16/2021)   Overall Financial Resource Strain (CARDIA)    Difficulty of Paying Living Expenses: Not hard at all  Food Insecurity: No Food Insecurity (09/04/2019)   Hunger Vital Sign    Worried About Running Out of Food in the Last Year: Never true    Ran Out of Food in the Last Year: Never true  Transportation Needs: No Transportation Needs (04/27/2020)   PRAPARE - Hydrologist (Medical): No    Lack of Transportation (Non-Medical): No  Physical Activity: Sufficiently Active (09/04/2019)   Exercise Vital Sign    Days of Exercise per Week: 6 days    Minutes of Exercise per Session: 60 min  Stress: No Stress Concern Present (09/04/2019)   [place]    Feeling of Stress : Not at all  Social Connections: Moderately Integrated (09/04/2019)   Social Connection and Isolation Panel [NHANES]     Frequency of Communication with Friends and Family: More than three times a week    Frequency of Social Gatherings with Friends and Family: Three times a week    Attends Religious Services: Never    Active Member of Clubs or Organizations: Yes    Attends Archivist Meetings: More than 4 times per year    Marital Status: Married  Human resources officer Violence: Not At Risk (09/04/2019)   Humiliation, Afraid, Rape, and Kick questionnaire    Fear of Current or Ex-Partner: No    Emotionally Abused: No    Physically Abused: No    Sexually Abused: No     Allergies  Allergen Reactions   Adhesive [Tape]     Band-aids irritate skin   Azithromycin Diarrhea and Nausea And Vomiting   Eliquis [Apixaban] Swelling    Leg and ankle swelling     CBC    Component Value Date/Time   WBC 5.1 07/28/2021 0843   RBC 4.06 07/28/2021 0843   HGB 13.2 07/28/2021 0843   HGB 13.7 05/11/2021 0959   HCT 39.5 07/28/2021 0843   HCT 40.3 05/11/2021 0959   PLT 159 07/28/2021 0843   PLT 216 05/11/2021 0959   MCV 97.3 07/28/2021 0843   MCV 95 05/11/2021 0959   MCH 32.5 07/28/2021 0843   MCHC 33.4 07/28/2021 0843   RDW 12.7 07/28/2021 0843  RDW 12.0 05/11/2021 0959   LYMPHSABS 1.7 05/11/2021 0959   EOSABS 0.1 05/11/2021 0959   BASOSABS 0.0 05/11/2021 0959    Pulmonary Functions Testing Results:    Latest Ref Rng & Units 09/25/2014    2:43 PM  PFT Results  FVC-Pre L 3.01   FVC-Predicted Pre % 91   FVC-Post L 3.00   FVC-Predicted Post % 91   Pre FEV1/FVC % % 76   Post FEV1/FCV % % 80   FEV1-Pre L 2.28   FEV1-Predicted Pre % 91   FEV1-Post L 2.40   DLCO uncorrected ml/min/mmHg 19.64   DLCO UNC% % 74   DLVA Predicted % 83   TLC L 5.06   TLC % Predicted % 95   RV % Predicted % 99     Outpatient Medications Prior to Visit  Medication Sig Dispense Refill   albuterol (PROAIR HFA) 108 (90 BASE) MCG/ACT inhaler Inhale 2 puffs into the lungs every 6 (six) hours as needed for wheezing or  shortness of breath. 3 Inhaler 3   amLODipine (NORVASC) 5 MG tablet TAKE 1 TABLET BY MOUTH EVERY DAY 90 tablet 4   ANORO ELLIPTA 62.5-25 MCG/ACT AEPB INHALE 1 PUFF BY MOUTH EVERY DAY 60 each 7   atorvastatin (LIPITOR) 80 MG tablet Take 1 tablet (80 mg total) by mouth daily. 90 tablet 3   Azelastine HCl 137 MCG/SPRAY SOLN SPRAY 2 SPRAYS INTO EACH NOSTRIL EVERY DAY 8 mL 3   benazepril (LOTENSIN) 20 MG tablet Take 1 tablet (20 mg total) by mouth daily. 90 tablet 3   Calcium Carbonate (CALCIUM 600 PO) Take 600 mg by mouth in the morning and at bedtime.     cetirizine (ZYRTEC) 10 MG tablet Take 10 mg by mouth daily as needed for allergies.     cholecalciferol (VITAMIN D3) 25 MCG (1000 UNIT) tablet Take 1,000 Units by mouth daily.     furosemide (LASIX) 40 MG tablet Take 1 tablet (40 mg total) by mouth daily as needed (swelling/weight gain). 90 tablet 3   levothyroxine (SYNTHROID) 112 MCG tablet TAKE 1 TABLET BY MOUTH SIX DAYS PER WEEK. TAKE 1 AND 1/2 TABLET BY MOUTH ONE DAY PER WEEK (WEDNESDAYS) 100 tablet 2   Multiple Vitamin (MULTIVITAMIN) tablet Take 1 tablet by mouth daily. Standard Process     nitroGLYCERIN (NITROSTAT) 0.4 MG SL tablet Place 0.4 mg under the tongue every 5 (five) minutes as needed for chest pain.      Omega-3 Fatty Acids (FISH OIL) 1000 MG CAPS Take 3,000 mg by mouth at bedtime.     XARELTO 20 MG TABS tablet TAKE 1 TABLET BY MOUTH DAILY WITH SUPPER. STOP ELIQUIS 90 tablet 1   zolpidem (AMBIEN) 5 MG tablet TAKE 1 TABLET BY MOUTH AT BEDTIME AS NEEDED FOR SLEEP 30 tablet 3   No facility-administered medications prior to visit.

## 2022-07-06 ENCOUNTER — Other Ambulatory Visit: Payer: Self-pay | Admitting: Internal Medicine

## 2022-07-06 DIAGNOSIS — R001 Bradycardia, unspecified: Secondary | ICD-10-CM | POA: Diagnosis not present

## 2022-07-06 DIAGNOSIS — K219 Gastro-esophageal reflux disease without esophagitis: Secondary | ICD-10-CM | POA: Diagnosis not present

## 2022-07-06 DIAGNOSIS — Z87891 Personal history of nicotine dependence: Secondary | ICD-10-CM | POA: Diagnosis not present

## 2022-07-06 DIAGNOSIS — I2510 Atherosclerotic heart disease of native coronary artery without angina pectoris: Secondary | ICD-10-CM | POA: Diagnosis not present

## 2022-07-06 DIAGNOSIS — I739 Peripheral vascular disease, unspecified: Secondary | ICD-10-CM | POA: Diagnosis not present

## 2022-07-06 DIAGNOSIS — J449 Chronic obstructive pulmonary disease, unspecified: Secondary | ICD-10-CM | POA: Diagnosis not present

## 2022-07-06 DIAGNOSIS — R0789 Other chest pain: Secondary | ICD-10-CM | POA: Diagnosis not present

## 2022-07-06 DIAGNOSIS — I10 Essential (primary) hypertension: Secondary | ICD-10-CM | POA: Diagnosis not present

## 2022-07-06 DIAGNOSIS — E7849 Other hyperlipidemia: Secondary | ICD-10-CM | POA: Diagnosis not present

## 2022-07-06 DIAGNOSIS — Z951 Presence of aortocoronary bypass graft: Secondary | ICD-10-CM | POA: Diagnosis not present

## 2022-07-06 DIAGNOSIS — R0602 Shortness of breath: Secondary | ICD-10-CM

## 2022-07-06 DIAGNOSIS — Z86718 Personal history of other venous thrombosis and embolism: Secondary | ICD-10-CM | POA: Diagnosis not present

## 2022-07-08 IMAGING — CT CT HEART MORPH/PULM VEIN W/ CM & W/O CA SCORE
2 of 5 series · 10 of 20 positions shown, 12 images · non-contrast
Comparison: CT a of the chest on 07/23/2019
COMPARISON: CT a of the chest on 07/23/2019

Addendum:
EXAM:
OVER-READ INTERPRETATION  CT CHEST

The following report is an over-read performed by radiologist Dr.
over-read does not include interpretation of cardiac or coronary
anatomy or pathology. The cardiac CTA interpretation by the
cardiologist is attached.
CLINICAL DATA: Atrial fibrillation scheduled for ablation.
Cardiac CTA
TECHNIQUE: A non-contrast, gated CT scan was obtained with axial slices of 3 mm
through the heart for calcium scoring. Calcium scoring was performed
using the Agatston method. A 120 kV retrospective, gated, contrast
cardiac scan was obtained. Gantry rotation speed was 250 msecs and
collimation was 0.6 mm. Nitroglycerin was not given. A delayed scan
was obtained to exclude left atrial appendage thrombus. The 3D
dataset was reconstructed in 5% intervals of the 0-95% of the R-R
cycle. Late systolic phases were analyzed on a dedicated workstation
using MPR, MIP, and VRT modes. The patient received 80 cc of
contrast.

[Series 6: best syst · axial · 0.39mm/px · z∈[-224,-132]mm · 7 of 308 slices shown, 9 images]
[im 39/308  vessel]
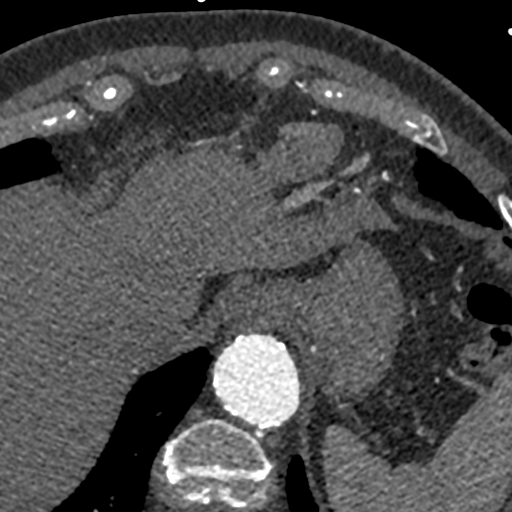
[im 39/308  lung]
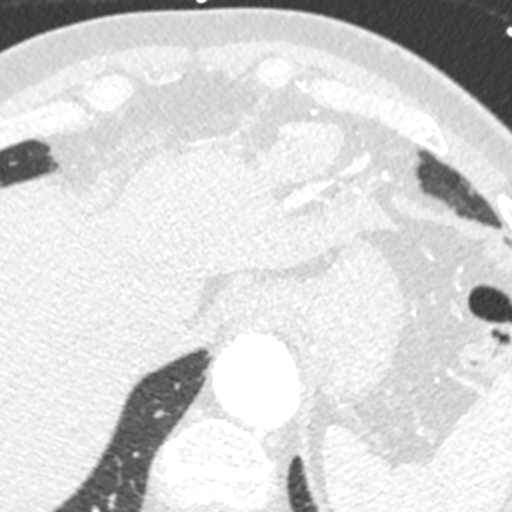
[im 77/308  vessel]
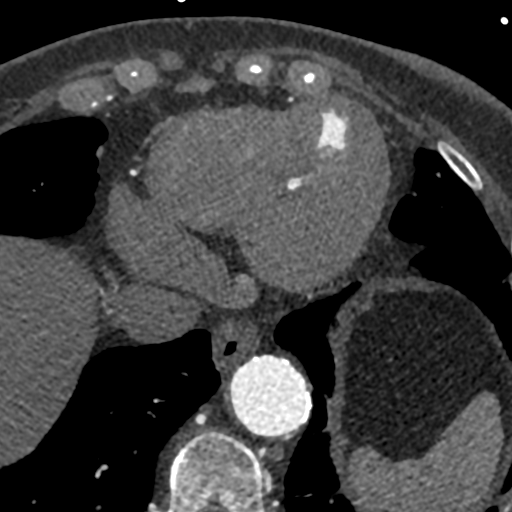
[im 116/308  vessel]
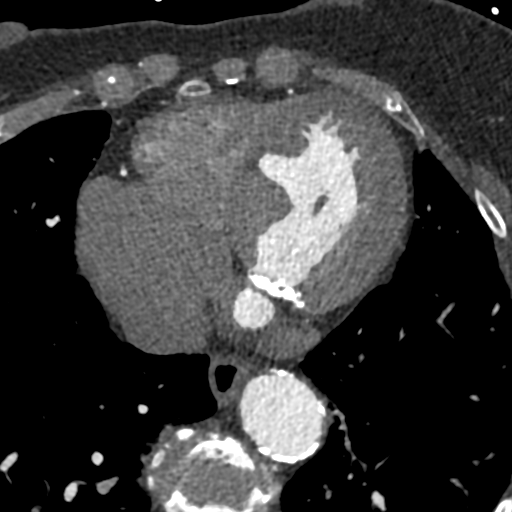
[im 154/308  vessel]
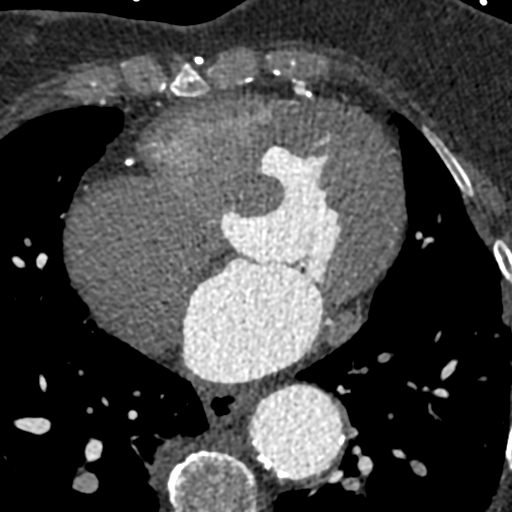
[im 192/308  vessel]
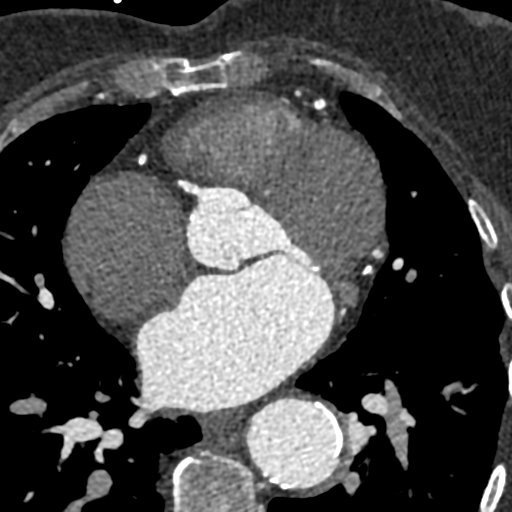
[im 192/308  lung]
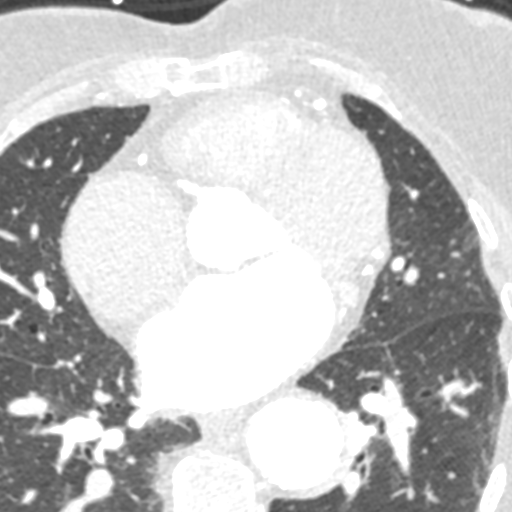
[im 231/308  vessel]
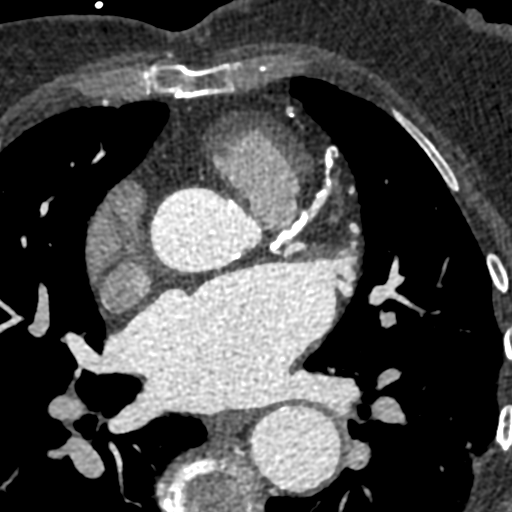
[im 269/308  vessel]
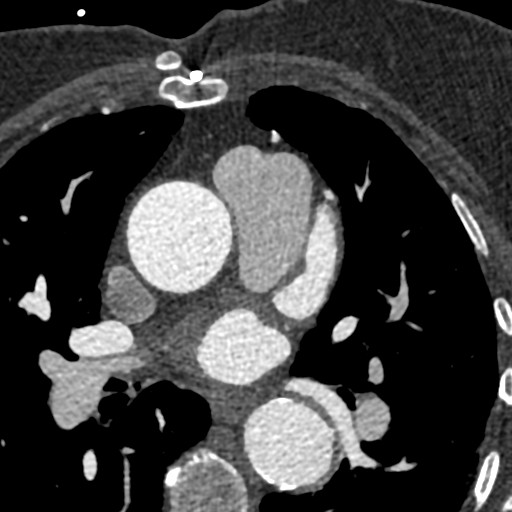

[Series 7: pv delay · axial · portal-venous · 0.39mm/px · z∈[-189,-140]mm · 3 of 195 slices shown]
[im 49/195  vessel]
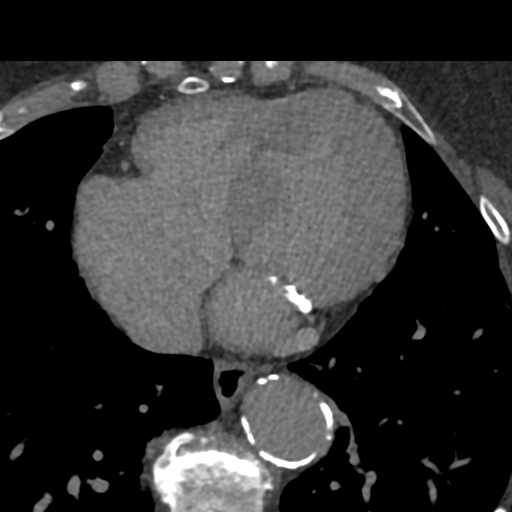
[im 98/195  vessel]
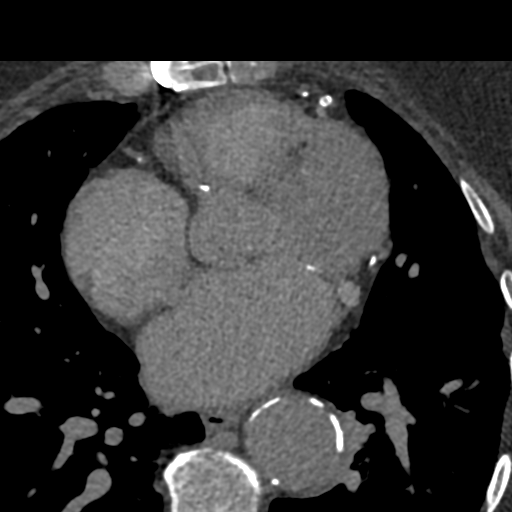
[im 146/195  vessel]
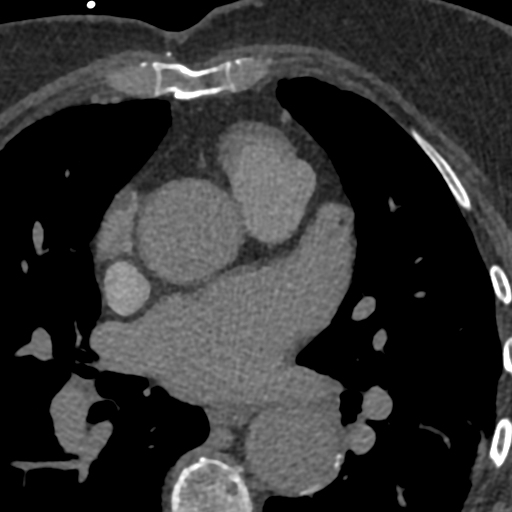

[10 of 20 positions shown; findings below may reference images not displayed]

FINDINGS: Vascular: Stable aneurysmal disease of the ascending thoracic aorta
measuring approximately 4.1 cm in greatest diameter. Extensive
atherosclerosis of the thoracic aorta again noted. The descending
thoracic aorta is mildly dilated and measures up to 3.4 cm.

Mediastinum/Nodes: Visualized mediastinum and hilar regions
demonstrate no lymphadenopathy or masses.

Lungs/Pleura: Visualized lungs show no evidence of pulmonary edema,
consolidation, pneumothorax, nodule or pleural fluid.

Upper Abdomen: No acute abnormality.

Musculoskeletal: No chest wall mass or suspicious bone lesions
identified.
IMPRESSION: Stable atherosclerosis and aneurysmal disease of the thoracic aorta.
The ascending thoracic aorta measures up to approximately 4.1 cm and
the descending thoracic aorta 3.4 cm. Recommend annual imaging
followup by CTA or MRA. This recommendation follows 8707
ACCF/AHA/AATS/ACR/ASA/SCA/TIGER/STACKHOUSE/DATABEX/ADENA Guidelines for the
Diagnosis and Management of Patients with Thoracic Aortic Disease.
Circulation. 8707; 121: [id number]. Aortic aneurysm NOS (C91NA-DTF.T)
FINDINGS: Image quality: Excellent.

The patient is s/p sternotomy.

Pulmonary Veins: There is normal pulmonary vein drainage into the
left atrium (2 on the right and 2 on the left) with ostial
measurements as follows:

RUPV: Ostium 23.1 x 19.9 mm  area 3.34 cm^2

RLPV:  Ostium 19.3 x 17.4 mm  area 2.43 cm^2

LUPV:  Ostium 20.2 x 19 mm area 3.01 cm^2

LLPV: Ostium 21.7 x 11.4 mm area 2.03 cm^2. This vessel is in
close proximity to the descending thoracic aorta.

Left Atrium: The left atrial size is moderately dilated. There is no
PFO/ASD. There is no thrombus in the left atrial appendage on
contrast or delayed imaging. The esophagus runs in the left atrial
midline and is not in proximity to any of the pulmonary vein ostia.

Coronary Arteries: Normal coronary origin. Right dominance. The
study was performed without use of NTG and is insufficient for
plaque evaluation in the native coronaries. Patent [REDACTED]-LAD and
XIO noted.

Right Atrium: Right atrial size is moderately dilated.

Right Ventricle: The right ventricular cavity is within normal
limits.

Left Ventricle: The ventricular cavity size is within normal limits.
There are no stigmata of prior infarction. There is no abnormal
filling defect.

Pericardium: Normal thickness with no significant effusion or
calcium present.

Pulmonary Artery: Normal caliber without proximal filling defect.

Aorta: The ascending aorta was dilated to 4.2 cm.

Extra-cardiac findings: See attached radiology report for
non-cardiac structures.
IMPRESSION: 1. There is normal pulmonary vein drainage into the left atrium with
ostial measurements above.

2. There is no thrombus in the left atrial appendage.

3. The esophagus runs in the left atrial midline and is not in
proximity to any of the pulmonary vein ostia. The LLPV is in close
proximity to the descending thoracic aorta.

4. No PFO/ASD.

5. Patent [REDACTED]-LAD and XIO.

6. 4.2 cm dilation of the ascending thoracic aorta. Would repeat
measurement in 1 year to look for progression.

*** End of Addendum ***
EXAM:
OVER-READ INTERPRETATION  CT CHEST

The following report is an over-read performed by radiologist Dr.
over-read does not include interpretation of cardiac or coronary
anatomy or pathology. The cardiac CTA interpretation by the
cardiologist is attached.
FINDINGS: Vascular: Stable aneurysmal disease of the ascending thoracic aorta
measuring approximately 4.1 cm in greatest diameter. Extensive
atherosclerosis of the thoracic aorta again noted. The descending
thoracic aorta is mildly dilated and measures up to 3.4 cm.

Mediastinum/Nodes: Visualized mediastinum and hilar regions
demonstrate no lymphadenopathy or masses.

Lungs/Pleura: Visualized lungs show no evidence of pulmonary edema,
consolidation, pneumothorax, nodule or pleural fluid.

Upper Abdomen: No acute abnormality.

Musculoskeletal: No chest wall mass or suspicious bone lesions
identified.
IMPRESSION: Stable atherosclerosis and aneurysmal disease of the thoracic aorta.
The ascending thoracic aorta measures up to approximately 4.1 cm and
the descending thoracic aorta 3.4 cm. Recommend annual imaging
followup by CTA or MRA. This recommendation follows 8707
ACCF/AHA/AATS/ACR/ASA/SCA/TIGER/STACKHOUSE/DATABEX/ADENA Guidelines for the
Diagnosis and Management of Patients with Thoracic Aortic Disease.
Circulation. 8707; 121: [id number]. Aortic aneurysm NOS (C91NA-DTF.T)

## 2022-07-09 ENCOUNTER — Other Ambulatory Visit (HOSPITAL_COMMUNITY): Payer: Self-pay | Admitting: Cardiology

## 2022-07-09 DIAGNOSIS — I4819 Other persistent atrial fibrillation: Secondary | ICD-10-CM

## 2022-07-11 NOTE — Telephone Encounter (Signed)
Prescription refill request for Xarelto received.  Indication: a fib Last office visit: 09/01/21 Weight: 181 Age: 77 Scr: 0.86 04/06/22 epic CrCl: 74 mL/min

## 2022-07-12 ENCOUNTER — Other Ambulatory Visit: Payer: Self-pay | Admitting: Physician Assistant

## 2022-07-12 DIAGNOSIS — J309 Allergic rhinitis, unspecified: Secondary | ICD-10-CM

## 2022-07-26 ENCOUNTER — Ambulatory Visit: Payer: Medicare HMO | Attending: Student in an Organized Health Care Education/Training Program

## 2022-07-26 DIAGNOSIS — R0602 Shortness of breath: Secondary | ICD-10-CM | POA: Diagnosis not present

## 2022-07-26 DIAGNOSIS — Z87891 Personal history of nicotine dependence: Secondary | ICD-10-CM | POA: Insufficient documentation

## 2022-07-27 ENCOUNTER — Other Ambulatory Visit: Payer: Self-pay | Admitting: Physician Assistant

## 2022-07-27 NOTE — Telephone Encounter (Signed)
Medication Refill - Medication: ANORO ELLIPTA 62.5-25 MCG/ACT AEPB   Has the patient contacted their pharmacy? Yes.     Preferred Pharmacy (with phone number or street name):  [id number] - [place], [place] - [address] Phone: [phone]  Fax: [phone]     Has the patient been seen for an appointment in the last year OR does the patient have an upcoming appointment? Yes.    Please assist patient further

## 2022-07-28 ENCOUNTER — Ambulatory Visit
Admission: RE | Admit: 2022-07-28 | Discharge: 2022-07-28 | Disposition: A | Discharge status: Home / Self Care | Payer: Medicare HMO | Source: Ambulatory Visit | Attending: Student in an Organized Health Care Education/Training Program | Admitting: Student in an Organized Health Care Education/Training Program

## 2022-07-28 DIAGNOSIS — R0600 Dyspnea, unspecified: Secondary | ICD-10-CM | POA: Insufficient documentation

## 2022-07-28 DIAGNOSIS — I10 Essential (primary) hypertension: Secondary | ICD-10-CM | POA: Insufficient documentation

## 2022-07-28 DIAGNOSIS — R0602 Shortness of breath: Secondary | ICD-10-CM | POA: Diagnosis not present

## 2022-07-28 DIAGNOSIS — E785 Hyperlipidemia, unspecified: Secondary | ICD-10-CM | POA: Insufficient documentation

## 2022-07-28 DIAGNOSIS — I080 Rheumatic disorders of both mitral and aortic valves: Secondary | ICD-10-CM | POA: Insufficient documentation

## 2022-07-28 LAB — ECHOCARDIOGRAM COMPLETE
AR max vel: 2.31 cm2
AV Area VTI: 2.7 cm2
AV Area mean vel: 2.48 cm2
AV Mean grad: 6 mmHg
AV Peak grad: 10.8 mmHg
Ao pk vel: 1.65 m/s
Area-P 1/2: 2.66 cm2
Calc EF: 57 %
S' Lateral: 3 cm
Single Plane A2C EF: 48.4 %
Single Plane A4C EF: 63.6 %

## 2022-07-28 LAB — PULMONARY FUNCTION TEST ARMC ONLY
FEF 25-75 Post: 1.52 L/sec
FEF 25-75 Pre: 1.49 L/sec
FEF2575-%Change-Post: 1 %
FEF2575-%Pred-Post: 86 %
FEF2575-%Pred-Pre: 85 %
FEV1-%Change-Post: 1 %
FEV1-%Pred-Post: 73 %
FEV1-%Pred-Pre: 72 %
FEV1-Post: 1.66 L
FEV1-Pre: 1.63 L
FEV1FVC-%Change-Post: -1 %
FEV1FVC-%Pred-Pre: 105 %
FEV6-%Change-Post: 3 %
FEV6-%Pred-Post: 74 %
FEV6-%Pred-Pre: 72 %
FEV6-Post: 2.13 L
FEV6-Pre: 2.06 L
FEV6FVC-%Change-Post: 0 %
FEV6FVC-%Pred-Post: 104 %
FEV6FVC-%Pred-Pre: 105 %
FVC-%Change-Post: 3 %
FVC-%Pred-Post: 71 %
FVC-%Pred-Pre: 68 %
FVC-Post: 2.14 L
FVC-Pre: 2.06 L
Post FEV1/FVC ratio: 78 %
Post FEV6/FVC ratio: 100 %
Pre FEV1/FVC ratio: 79 %
Pre FEV6/FVC Ratio: 100 %
RV % pred: 89 %
RV: 2.12 L
TLC % pred: 80 %
TLC: 4.25 L

## 2022-07-28 MED ORDER — ANORO ELLIPTA 62.5-25 MCG/ACT IN AEPB: INHALATION_SPRAY | RESPIRATORY_TRACT | 7 refills | Status: AC

## 2022-07-28 NOTE — Progress Notes (Signed)
*  PRELIMINARY RESULTS* Echocardiogram 2D Echocardiogram has been performed.  Penny Reed 07/28/2022, 10:51 AM

## 2022-07-28 NOTE — Telephone Encounter (Signed)
Requested Prescriptions  Pending Prescriptions Disp Refills   umeclidinium-vilanterol (ANORO ELLIPTA) 62.5-25 MCG/ACT AEPB 60 each 7    Sig: INHALE 1 PUFF BY MOUTH EVERY DAY     Pulmonology:  [employer] Passed - 07/27/2022  4:07 PM      Passed - Valid encounter within last 12 months    Recent Outpatient Visits           2 months ago Pure hypercholesterolemia   [place] [name], PA-C   3 months ago Essential (primary) hypertension   [place] Thedore Mins, [place], PA-C   5 months ago [place] [name], PA-C   10 months ago Encounter for [employer] annual wellness exam   [facility] Eulas Post, MD   1 year ago Essential hypertension   [place] Eulas Post, MD       Future Appointments             In 2 weeks Armando Reichert, MD [facility] [place]   In 2 months Thedore Mins, [place] [facility]

## 2022-08-05 ENCOUNTER — Encounter: Payer: Self-pay | Admitting: Physician Assistant

## 2022-08-05 ENCOUNTER — Other Ambulatory Visit: Payer: Self-pay

## 2022-08-05 MED ORDER — BENAZEPRIL HCL 20 MG PO TABS: 20.0000 mg | ORAL_TABLET | Freq: Every day | ORAL | 3 refills | Status: DC

## 2022-08-10 NOTE — Progress Notes (Unsigned)
Cardiology Office Note Date:  08/11/2022  Patient ID:  Penny Reed, Penny Reed May 24, 1945, MRN [id number] PCP:  [name], PA-C  Cardiologist:  Alwyn Pea, MD Electrophysiologist: Lanier Prude, MD   Chief Complaint: 1 year follow-up, afib  History of Present Illness: Penny Reed is a 77 y.o. female with PMH notable for atypical aflutter, persistent Afib, CAD s/p CABG, COPD, HTN, PAD, h/o DVT; seen today for Lanier Prude, MD for routine electrophysiology followup.  She is s/p PVI ablation 05/2021, had AF post-procedure. S/p DCCV 07/2021.  Last saw Dr. Lalla Brothers 08/2021, had done well since DCCV maintaining SR. Very active Saw Dr. Juliann Pares 06/2022. Having CP > recommended stress MRI, not done yet.   Today, she tells me that she has had AF episodes in the last year. About 3-4 today, lasting about 30 minutes. During episodes, has palpitations and SOB, but is not debilitated during episodes. She is able to check pulse during episodes, pulse about 110-120s. She is happy with this level of burden and is not interested in further procedures or AAD medications.   No bleeding concerns on xarelto.   She is very active, works out on elliptical most days of the week, free weights 3/week.   She checks BP at home, systolic is 130-140s at home.    Past Medical History:  Diagnosis Date   Allergic rhinitis    CAD (coronary artery disease)    Complication of anesthesia    woke up during colonoscopy   Hyperlipidemia    Hypertension    Hypothyroidism    LVH (left ventricular hypertrophy)     Past Surgical History:  Procedure Laterality Date   ABDOMINAL HYSTERECTOMY     has left ovary   APPENDECTOMY     ATRIAL FIBRILLATION ABLATION N/A 05/31/2021   Procedure: ATRIAL FIBRILLATION ABLATION;  Surgeon: Lanier Prude, MD;  Location: [facility] INVASIVE CV LAB;  Service: Cardiovascular;  Laterality: N/A;   CARDIAC CATHETERIZATION  09/2005   had 1 stent and 3 balloons placed    CARDIOVERSION N/A 04/02/2021   Procedure: CARDIOVERSION;  Surgeon: Meriam Sprague, MD;  Location: [facility] ENDOSCOPY;  Service: Cardiovascular;  Laterality: N/A;   CARDIOVERSION N/A 07/28/2021   Procedure: CARDIOVERSION;  Surgeon: Maisie Fus, MD;  Location: [facility] ENDOSCOPY;  Service: Cardiovascular;  Laterality: N/A;   COLONOSCOPY WITH PROPOFOL N/A 12/04/2019   Procedure: COLONOSCOPY WITH PROPOFOL;  Surgeon: Earline Mayotte, MD;  Location: [facility] ENDOSCOPY;  Service: Endoscopy;  Laterality: N/A;   CORONARY ARTERY BYPASS GRAFT     SVT ABLATION N/A 09/04/2020   Procedure: SVT ABLATION;  Surgeon: Lanier Prude, MD;  Location: [facility] INVASIVE CV LAB;  Service: Cardiovascular;  Laterality: N/A;   TONSILLECTOMY      Current Outpatient Medications  Medication Instructions   albuterol (PROAIR HFA) 108 (90 BASE) MCG/ACT inhaler 2 puffs, Inhalation, Every 6 hours PRN   amLODipine (NORVASC) 10 mg, Oral, Daily   atorvastatin (LIPITOR) 80 mg, Oral, Daily   Azelastine HCl 137 MCG/SPRAY SOLN SPRAY 2 SPRAYS INTO EACH NOSTRIL EVERY DAY   benazepril (LOTENSIN) 20 mg, Oral, Daily   Calcium Carbonate (CALCIUM 600 PO) 600 mg, Oral, 2 times daily   cetirizine (ZYRTEC) 10 mg, Oral, Daily PRN   cholecalciferol (VITAMIN D3) 1,000 Units, Oral, Daily   Fish Oil 3,000 mg, Oral, Daily at bedtime   furosemide (LASIX) 40 mg, Oral, Daily PRN   levothyroxine (SYNTHROID) 112 MCG tablet TAKE 1 TABLET BY MOUTH SIX DAYS  PER WEEK. TAKE 1 AND 1/2 TABLET BY MOUTH ONE DAY PER WEEK [facility])   Multiple Vitamin (MULTIVITAMIN) tablet 1 tablet, Oral, Daily, Standard Process   nitroGLYCERIN (NITROSTAT) 0.4 mg, Sublingual, Every 5 min PRN   rivaroxaban (XARELTO) 20 MG TABS tablet TAKE 1 TABLET BY MOUTH DAILY WITH SUPPER. STOP ELIQUIS   umeclidinium-vilanterol (ANORO ELLIPTA) 62.5-25 MCG/ACT AEPB INHALE 1 PUFF BY MOUTH EVERY DAY   zolpidem (AMBIEN) 5 mg, Oral, At bedtime PRN, for sleep    Social History:  The patient  reports that  she quit smoking about 17 years ago. Her smoking use included cigarettes. She has a 6.25 pack-year smoking history. She has never used smokeless tobacco. She reports current alcohol use of about 6.0 - 14.0 standard drinks of alcohol per week. She reports that she does not use drugs.   Family History:  The patient's family history includes Asthma in her sister; Breast cancer in her maternal aunt; Breast cancer (age of onset: 79) in her sister; Cancer in her sister; Cancer - Colon in her paternal grandmother; Coronary artery disease in her father; Diabetes in her mother; Heart attack in her brother and father; Hyperlipidemia in her sister; Hypertension in her sister; Scleroderma in her sister; Stroke in her mother; Thyroid disease in her sister.  ROS:  Please see the history of present illness. All other systems are reviewed and otherwise negative.   PHYSICAL EXAM:  VS:  BP 138/60 (BP Location: Left Arm, Patient Position: Sitting, Cuff Size: Normal)   Pulse (!) 56   Ht  (1.702 m)   Wt 181 lb (82.1 kg)   SpO2 99%   BMI 28.35 kg/m  BMI: Body mass index is 28.35 kg/m.  GEN- The patient is well appearing, alert and oriented x 3 today.   Lungs- Clear to ausculation bilaterally, normal work of breathing.  Heart- Regular rate and rhythm, no murmurs, rubs or gallops Extremities- Trace peripheral edema, warm, dry   EKG is ordered. Personal review of EKG from today shows: SB with PAC, rate 56bpm  Recent Labs: 04/06/2022: ALT 26; BUN 15; Creatinine, Ser 0.86; Potassium 5.0; Sodium 145; TSH 2.070  04/06/2022: Chol/HDL Ratio 2.1; Cholesterol, Total 193; HDL 93; LDL Chol Calc (NIH) 87; Triglycerides 72   CrCl cannot be calculated (Patient's most recent lab result is older than the maximum 21 days allowed.).   Wt Readings from Last 3 Encounters:  08/11/22 181 lb (82.1 kg)  07/01/22 181 lb 9.6 oz (82.4 kg)  05/19/22 179 lb 11.2 oz (81.5 kg)     Additional studies reviewed include: Previous  EP, cardiology notes.   TTE, 07/28/2022  1. Left ventricular ejection fraction, by estimation, is 45 to 50%. The left ventricle has mildly decreased function. The left ventricle has no regional wall motion abnormalities. There is moderate concentric left ventricular hypertrophy. Left ventricular diastolic parameters are consistent with Grade III diastolic dysfunction (restrictive).   2. Right ventricular systolic function is normal. The right ventricular size is mildly enlarged. Mildly increased right ventricular wall thickness.   3. The mitral valve is normal in structure. Mild mitral valve regurgitation.   4. The aortic valve is normal in structure. Aortic valve regurgitation is mild. Mild aortic valve stenosis.   AF ablation, 05/31/2021 1. Pulmonary vein isolation (wide antral circumferential lesion set) 2. Posterior wall ablation and isolation  3. Cavotricuspid isthmus ablation for typical atrial flutter 4. Ablation of atypical, perimitral, atrial flutter (RSPV-MV line) 5. Esophageal temperature monitoring using the Circa temperature  monitor 6. Three-dimensional electroanatomic mapping of atrial fibrillation 7. Intracardiac echocardiography 8. Transseptal puncture of an intact septum. 9. Comprehensive EP study with infusion of Isuprel   TTE, 05/25/2021  1. Left ventricular ejection fraction, by estimation, is 60 to 65%. The left ventricle has normal function. Unable to exclude small region of hypokinesis in the mid to basal septal wall (wall thinning noted in this region) There is mild to moderate asymmetric left ventricular hypertrophy of the basal-septal and septal segments. Left ventricular diastolic parameters are consistent with Grade II diastolic dysfunction (pseudonormalization).   2. Right ventricular systolic function is normal. The right ventricular size is normal. Mildly increased right ventricular wall thickness.   3. Left atrial size was severely dilated.   4. The mitral valve is  normal in structure. Mild mitral valve regurgitation. No evidence of mitral stenosis.   5. The aortic valve has an indeterminant number of cusps. Aortic valve regurgitation is mild. No aortic stenosis is present.   6. There is mild dilatation of the ascending aorta, measuring 42 mm.   7. The inferior vena cava is normal in size with greater than 50% respiratory variability, suggesting right atrial pressure of 3 mmHg.   SVT ablation, 09/04/2020 1. Sinus rhythm upon presentation.  2. Atypical atrial flutter inducible at EP study. At least 3 different circuits inducible during the EP study. 3. AV conduction appears nodal. 4. No early apparent complications.  5. Her clinical tachycardia is consistent with these atrial flutters. We will initiate Eliquis 5 mg by mouth twice daily for stroke prophylaxis. I will plan to see her back in clinic in 2 weeks to discuss antiarrhythmic therapy (Tikosyn versus amiodarone).   ASSESSMENT AND PLAN:  #) atypical atrial flutter #) persistent Afib S/p AF ablation 05/2021 Minimal burden currently, patient is happy with current level of control and not interested in AAD CHA2DS2-VASc Score = 6 (CHF, HTN, vasc, age x 2, gender)  OAC -  xarelto, appropriately dosed for CrCl >3ml/min.      #) CAD No ischemic s/s   #) HTN Elevated in office and on home readings Increase amlodipine to  daily Cont benazepril Check BP daily for next 1-2 weeks She has PCP follow-up scheduled in June   Current medicines are reviewed at length with the patient today.   The patient does not have concerns regarding her medicines.  The following changes were made today:   INCREASE amlodipine  daily  Labs/ tests ordered today include:  Orders Placed This Encounter  Procedures   EKG 12-Lead     Disposition: Follow up with Dr. Lalla Brothers in in 12 months   Signed, Sherie Don, NP  08/11/22  2:33 PM  Electrophysiology [facility]

## 2022-08-11 ENCOUNTER — Encounter: Payer: Self-pay | Admitting: Cardiology

## 2022-08-11 ENCOUNTER — Ambulatory Visit: Payer: Medicare HMO | Attending: Cardiology | Admitting: Cardiology

## 2022-08-11 VITALS — BP 138/60 | HR 56 | Ht 67.0 in | Wt 181.0 lb

## 2022-08-11 DIAGNOSIS — I2510 Atherosclerotic heart disease of native coronary artery without angina pectoris: Secondary | ICD-10-CM

## 2022-08-11 DIAGNOSIS — I484 Atypical atrial flutter: Secondary | ICD-10-CM

## 2022-08-11 DIAGNOSIS — D6869 Other thrombophilia: Secondary | ICD-10-CM | POA: Diagnosis not present

## 2022-08-11 DIAGNOSIS — I4819 Other persistent atrial fibrillation: Secondary | ICD-10-CM

## 2022-08-11 DIAGNOSIS — I10 Essential (primary) hypertension: Secondary | ICD-10-CM

## 2022-08-11 MED ORDER — AMLODIPINE BESYLATE 10 MG PO TABS: 10.0000 mg | ORAL_TABLET | Freq: Every day | ORAL | 0 refills | Status: DC

## 2022-08-11 NOTE — Patient Instructions (Signed)
Medication Instructions:  INCREASE amlodipine to 10 mg by mouth daily  *If you need a refill on your cardiac medications before your next appointment, please call your pharmacy*   Lab Work: No labs ordered  If you have labs (blood work) drawn today and your tests are completely normal, you will receive your results only by: MyChart Message (if you have MyChart) OR A paper copy in the mail If you have any lab test that is abnormal or we need to change your treatment, we will call you to review the results.   Testing/Procedures: No testing ordered  Follow-Up: At [facility], you and your health needs are our priority.  As part of our continuing mission to provide you with exceptional heart care, we have created designated Provider Care Teams.  These Care Teams include your primary Cardiologist (physician) and Advanced Practice Providers (APPs -  Physician Assistants and Nurse Practitioners) who all work together to provide you with the care you need, when you need it.  We recommend signing up for the patient portal called "MyChart".  Sign up information is provided on this After Visit Summary.  MyChart is used to connect with patients for Virtual Visits (Telemedicine).  Patients are able to view lab/test results, encounter notes, upcoming appointments, etc.  Non-urgent messages can be sent to your provider as well.   To learn more about what you can do with MyChart, go to [email or web address].    Your next appointment:   1 year(s)  Provider:   Steffanie Dunn, MD or Sherie Don, NP

## 2022-08-16 ENCOUNTER — Ambulatory Visit: Payer: Medicare HMO | Admitting: Student in an Organized Health Care Education/Training Program

## 2022-08-25 ENCOUNTER — Other Ambulatory Visit (HOSPITAL_COMMUNITY): Payer: Medicare HMO

## 2022-08-26 ENCOUNTER — Encounter (HOSPITAL_COMMUNITY): Payer: Self-pay

## 2022-08-26 ENCOUNTER — Other Ambulatory Visit (HOSPITAL_COMMUNITY): Payer: Self-pay | Admitting: *Deleted

## 2022-08-26 DIAGNOSIS — Z01810 Encounter for preprocedural cardiovascular examination: Secondary | ICD-10-CM

## 2022-08-29 ENCOUNTER — Ambulatory Visit: Payer: Medicare HMO

## 2022-08-29 ENCOUNTER — Telehealth (HOSPITAL_COMMUNITY): Payer: Self-pay | Admitting: *Deleted

## 2022-08-29 DIAGNOSIS — J449 Chronic obstructive pulmonary disease, unspecified: Secondary | ICD-10-CM

## 2022-08-29 DIAGNOSIS — I10 Essential (primary) hypertension: Secondary | ICD-10-CM

## 2022-08-29 MED ORDER — BENAZEPRIL HCL 10 MG PO TABS
10.0000 mg | ORAL_TABLET | Freq: Every day | ORAL | 3 refills | Status: DC
Start: 2022-08-29 — End: 2022-12-20

## 2022-08-29 NOTE — Progress Notes (Signed)
Care Management & Coordination Services Pharmacy Note  08/29/2022 Name:  Penny Reed MRN:  [id number] DOB:  June 11, 1945  Summary: Patient presents for follow-up consult.   -Patient reports that since her amlodipine was increased to 10 mg daily, she has noted borderline hypotension associated with mild dizziness and worsening swelling. She is using her furosemide as needed to manage the swelling.   Recommendations/Changes made from today's visit: -Counseled on proper use of diuretic, use of elevation / compression stockings to aid with swelling.  -Recommend decreasing benazepril to 10 mg daily.   Follow up plan: CPP follow-up 3 months   Subjective: Penny Reed is an 77 y.o. year old female who is a primary patient of Ok Edwards, Lillia Abed, [place].  The care coordination team was consulted for assistance with disease management and care coordination needs.    Engaged with patient by telephone for follow up visit.  Recent office visits: 05/19/22: Patient presented to [name], PA-C for follow-up.   Recent consult visits: 08/11/22: Patient presented to Sherie Don, NP (Cardiology) for follow-up.  07/06/22: Patient presented to Dr. Juliann Pares (Cardiology) for follow-up.  07/01/22: Patient presented to Dr. Aundria Rud (Pulmonology) for follow-up.   Hospital visits: None in previous 6 months   Objective:  Lab Results  Component Value Date   CREATININE 0.86 04/06/2022   BUN 15 04/06/2022   EGFR 71 04/06/2022   GFRNONAA 56 (L) 07/28/2021   GFRAA 81 05/29/2020   NA 145 (H) 04/06/2022   K 5.0 04/06/2022   CALCIUM 10.2 04/06/2022   CO2 22 04/06/2022   GLUCOSE 106 (H) 04/06/2022    Lab Results  Component Value Date/Time   HGBA1C 5.4 04/06/2022 09:54 AM   HGBA1C 5.8 (H) 03/23/2021 09:00 AM    Last diabetic Eye exam: No results found for: "HMDIABEYEEXA"  Last diabetic Foot exam: No results found for: "HMDIABFOOTEX"   Lab Results  Component Value Date   CHOL 193 04/06/2022    HDL 93 04/06/2022   LDLCALC 87 04/06/2022   TRIG 72 04/06/2022   CHOLHDL 2.1 04/06/2022       Latest Ref Rng & Units 04/06/2022    9:54 AM 03/23/2021    9:00 AM 09/05/2018   11:49 AM  Hepatic Function  Total Protein 6.0 - 8.5 g/dL 7.0  7.0  7.1   Albumin 3.8 - 4.8 g/dL 4.5  4.7  4.5   AST 0 - 40 IU/L 39  29  38   ALT 0 - 32 IU/L 26  23    Alk Phosphatase 44 - 121 IU/L 85  92  77   Total Bilirubin 0.0 - 1.2 mg/dL 0.5  0.8  1.0     Lab Results  Component Value Date/Time   TSH 2.070 04/06/2022 09:54 AM   TSH 2.310 03/23/2021 09:00 AM   FREET4 1.46 04/06/2022 09:54 AM       Latest Ref Rng & Units 07/28/2021    8:43 AM 05/11/2021    9:59 AM 03/23/2021    9:00 AM  CBC  WBC 4.0 - 10.5 K/uL 5.1  5.8  4.5   Hemoglobin 12.0 - 15.0 g/dL 19.1  47.8  29.5   Hematocrit 36.0 - 46.0 % 39.5  40.3  39.7   Platelets 150 - 400 K/uL 159  216  203     No results found for: "VD25OH", "VITAMINB12"  Clinical ASCVD: Yes  The ASCVD Risk score (Arnett DK, et al., 2019) failed to calculate for the following reasons:  The patient has a prior MI or stroke diagnosis       04/06/2022    9:29 AM 09/28/2021   10:08 AM 03/23/2021    8:26 AM  Depression screen PHQ 2/9  Decreased Interest 0 0 0  Down, Depressed, Hopeless 0 0 0  PHQ - 2 Score 0 0 0  Altered sleeping 1 0 2  Tired, decreased energy 1 1 1   Change in appetite 0 1 0  Feeling bad or failure about yourself  0 0 0  Trouble concentrating 0 0 0  Moving slowly or fidgety/restless 0 0 0  Suicidal thoughts 0 0 0  PHQ-9 Score 2 2 3   Difficult doing work/chores Not difficult at all Not difficult at all Not difficult at all     Social History   Tobacco Use  Smoking Status Former   Packs/day: 0.25   Years: 25.00   Additional pack years: 0.00   Total pack years: 6.25   Types: Cigarettes   Quit date: 04/25/2005   Years since quitting: 17.3  Smokeless Tobacco Never  Tobacco Comments   Former smoker 06/28/21   BP Readings from Last 3  Encounters:  08/11/22 138/60  07/01/22 130/60  05/19/22 (!) 120/50   Pulse Readings from Last 3 Encounters:  08/11/22 (!) 56  07/01/22 73  05/19/22 63   Wt Readings from Last 3 Encounters:  08/11/22 181 lb (82.1 kg)  07/01/22 181 lb 9.6 oz (82.4 kg)  05/19/22 179 lb 11.2 oz (81.5 kg)   BMI Readings from Last 3 Encounters:  08/11/22 28.35 kg/m  07/01/22 28.44 kg/m  05/19/22 28.14 kg/m    Allergies  Allergen Reactions   Adhesive [Tape]     Band-aids irritate skin   Azithromycin Diarrhea and Nausea And Vomiting   Eliquis [Apixaban] Swelling    Leg and ankle swelling    Medications Reviewed Today     Reviewed by Thayer Headings, Ruthell Rummage (Certified Medical Assistant) on 08/11/22 at 1312  Med List Status: <None>   Medication Order Taking? Sig Documenting Provider Last Dose Status Informant  albuterol (PROAIR HFA) 108 (90 BASE) MCG/ACT inhaler [id number]  Inhale 2 puffs into the lungs every 6 (six) hours as needed for wheezing or shortness of breath. Bosie Clos, MD  Active Self  amLODipine (NORVASC) 5 MG tablet [id number]  TAKE 1 TABLET BY MOUTH EVERY DAY Bosie Clos, MD  Active   atorvastatin (LIPITOR) 80 MG tablet [id number]  Take 1 tablet (80 mg total) by mouth daily. [name], PA-C  Active   Azelastine HCl 137 MCG/SPRAY SOLN [id number]  SPRAY 2 SPRAYS INTO EACH NOSTRIL EVERY DAY Drubel, Lillia Abed, PA-C  Active   benazepril (LOTENSIN) 20 MG tablet [id number]  Take 1 tablet (20 mg total) by mouth daily. [name], PA-C  Active   Calcium Carbonate (CALCIUM 600 PO) [id number]  Take 600 mg by mouth in the morning and at bedtime. [provider]  Active Self  cetirizine (ZYRTEC) 10 MG tablet [id number]  Take 10 mg by mouth daily as needed for allergies. [provider]  Active Self  cholecalciferol (VITAMIN D3) 25 MCG (1000 UNIT) tablet [id number]  Take 1,000 Units by mouth daily. [provider]  Active Self  furosemide (LASIX) 40  MG tablet [id number]  Take 1 tablet (40 mg total) by mouth daily as needed (swelling/weight gain). [name], PA-C  Active   levothyroxine (SYNTHROID) 112 MCG tablet [id number]  TAKE 1 TABLET BY MOUTH SIX DAYS PER WEEK.  TAKE 1 AND 1/2 TABLET BY MOUTH ONE DAY PER WEEK (WEDNESDAYS) [name], PA-C  Active   Multiple Vitamin (MULTIVITAMIN) tablet [id number]  Take 1 tablet by mouth daily. Standard Process [provider]  Active Self  nitroGLYCERIN (NITROSTAT) 0.4 MG SL tablet [id number]  Place 0.4 mg under the tongue every 5 (five) minutes as needed for chest pain.  [provider]  Active Self  Omega-3 Fatty Acids (FISH OIL) 1000 MG CAPS [id number]  Take 3,000 mg by mouth at bedtime. [provider]  Active Self  rivaroxaban (XARELTO) 20 MG TABS tablet [id number]  TAKE 1 TABLET BY MOUTH DAILY WITH SUPPER. STOP Jonette Eva, MD  Active   umeclidinium-vilanterol [facility] ELLIPTA) 62.5-25 MCG/ACT AEPB [id number]  INHALE 1 PUFF BY MOUTH EVERY DAY [name], PA-C  Active   zolpidem (AMBIEN) 5 MG tablet [id number]  TAKE 1 TABLET BY MOUTH AT BEDTIME AS NEEDED FOR SLEEP Bosie Clos, MD  Active             SDOH:  (Social Determinants of Health) assessments and interventions performed: Yes SDOH Interventions    Flowsheet Row Office Visit from 09/28/2021 in [facility] Office Visit from 03/23/2021 in [facility] Chronic Care Management from 03/09/2021 in [facility] Chronic Care Management from 12/15/2020 in [facility] Chronic Care Management from 04/27/2020 in [facility]  SDOH Interventions       Transportation Interventions -- -- -- -- Intervention Not Indicated  Depression Interventions/Treatment  PHQ2-9 Score <4 Follow-up Not Indicated PHQ2-9 Score <4 Follow-up Not Indicated -- -- --  Financial Strain Interventions --  -- Intervention Not Indicated Other (Comment)  [PAP] Intervention Not Indicated       Medication Assistance: None required.  Patient affirms current coverage meets needs.  Medication Access: Within the past 30 days, how often has patient missed a dose of medication? None Is a pillbox or other method used to improve adherence? Yes  Factors that may affect medication adherence? no barriers identified Are meds synced by current pharmacy? No  Are meds delivered by current pharmacy? No  Does patient experience delays in picking up medications due to transportation concerns? No   [facility] Services Reviewed: Is patient disadvantaged to use [facility]?: Yes  Current Rx insurance plan: [employer] Name and location of Current pharmacy:  [facility] [phone] [place], [place] - [address] [place] [address] Phone: [phone] Fax: [phone]  [facility] [phone] [place], [place] - [address] [place] [address] Phone: [phone] Fax: [phone]  [facility] [address], [place] - [address] [place] [address] Phone: [phone] Fax: [phone]  [id number] [place], [place] - [address] AT [facility] [address] [place] [address] Phone: [phone] Fax: [phone]  [facility] services reviewed with patient today?: No  Patient requests to transfer care to [facility] Pharm[facility]?: No  Reason patient declined to change pharmacies: Disadvantaged due to insurance/mail order  Compliance/Adherence/Medication fill history: Care Gaps: COVID-19 Vaccine (5 - 2023-24 season) Zoster Vaccines- Shingrix (2 of 2) Medicare Annual Wellness   Star-Rating Drugs: Atorvastatin 80 mg: Last filled 08/16/22 for 90-DS  Benazepril 20 mg: Last filled 06/16/22 for 90-DS   Assessment/Plan  Hypertension (BP goal <140/90) -Controlled -Current treatment: Amlodipine 10 mg  daily  Furosemide 40 mg tablet daily as needed  Benazepril 20 mg daily  -Medications previously tried: Quinapril (discontinued) -Current home readings: 118/51, 121/48,   -Reports hypotensive symptoms: dizziness when bending over. She also reports worsening swelling in right ankle.    -Patient reports that since her amlodipine was increased to 10 mg daily, she has noted borderline hypotension associated with mild dizziness and worsening swelling. She is using her furosemide as needed to manage the swelling.  -Counseled on proper use of diuretic, use of elevation / compression stockings to aid with swelling.  -Recommend decreasing benazepril to 10 mg daily.   Hyperlipidemia: (LDL goal < 70) -Controlled -Current treatment: Atorvastatin 80 mg daily  -Medications previously tried: NA  -Recommended to continue current medication  Atrial Flutter (Goal: prevent stroke and major bleeding) -Controlled -CHADSVASC: 4 -Current treatment: Rate control: None Anticoagulation: Xarelto 20 mg daily -Medications previously tried: Eliquis (Swelling) -Denies symptoms of A-Fib.  -Recommended to continue current medication  COPD (Goal: control symptoms and prevent exacerbations) -Controlled -Current treatment  Albuterol HFA 2 puffs every 6 hours as needed Anoro 1 puff daily  -Medications previously tried: NA -Exacerbations requiring treatment in last 6 months: Dec 2023 requiring outpt steroids  -Patient reports consistent use of maintenance inhaler -Frequency of rescue inhaler use: <1x monthly.  -Continue current medications  Follow Up Plan: Telephone follow up appointment with care management team member scheduled for:  11/29/2022 at 11:00 AM  [name], PharmD, Patsy Baltimore, CPP  Clinical Pharmacist Practitioner  [facility] [phone]

## 2022-08-29 NOTE — Telephone Encounter (Signed)
Reaching out to patient to offer assistance regarding upcoming cardiac imaging study; pt verbalizes understanding of appt date/time, parking situation and where to check in, and verified current allergies; name and call back number provided for further questions should they arise  Larey Brick RN Navigator Cardiac Imaging [facility] [phone] office [phone] cell  Patient has had a cardiac MRI in the past without incident. She is aware to avoid caffeine 12 hours prior to her Stress MRI.

## 2022-08-30 ENCOUNTER — Ambulatory Visit (HOSPITAL_COMMUNITY)
Admission: RE | Admit: 2022-08-30 | Discharge: 2022-08-30 | Disposition: A | Discharge status: Home / Self Care | Payer: Medicare HMO | Source: Ambulatory Visit | Attending: Internal Medicine | Admitting: Internal Medicine

## 2022-08-30 ENCOUNTER — Other Ambulatory Visit: Payer: Self-pay | Admitting: Internal Medicine

## 2022-08-30 ENCOUNTER — Ambulatory Visit (HOSPITAL_COMMUNITY)
Admission: RE | Admit: 2022-08-30 | Discharge: 2022-08-30 | Disposition: A | Discharge status: Home / Self Care | Payer: Medicare HMO | Source: Ambulatory Visit | Attending: Cardiology | Admitting: Cardiology

## 2022-08-30 ENCOUNTER — Encounter: Payer: Self-pay | Admitting: Cardiology

## 2022-08-30 ENCOUNTER — Telehealth: Payer: Self-pay

## 2022-08-30 DIAGNOSIS — R0602 Shortness of breath: Secondary | ICD-10-CM | POA: Diagnosis not present

## 2022-08-30 DIAGNOSIS — I351 Nonrheumatic aortic (valve) insufficiency: Secondary | ICD-10-CM | POA: Diagnosis not present

## 2022-08-30 DIAGNOSIS — Z01810 Encounter for preprocedural cardiovascular examination: Secondary | ICD-10-CM | POA: Diagnosis not present

## 2022-08-30 MED ORDER — AMINOPHYLLINE 25 MG/ML IV SOLN
INTRAVENOUS | Status: AC
  Filled 2022-08-30: qty 10

## 2022-08-30 MED ORDER — GADOBUTROL 1 MMOL/ML IV SOLN
8.0000 mL | Freq: Once | INTRAVENOUS | Status: AC | PRN
  Administered 2022-08-30: 8 mL via INTRAVENOUS

## 2022-08-30 MED ORDER — REGADENOSON 0.4 MG/5ML IV SOLN
INTRAVENOUS | Status: AC
  Filled 2022-08-30: qty 5

## 2022-08-30 MED ORDER — REGADENOSON 0.4 MG/5ML IV SOLN
0.4000 mg | Freq: Once | INTRAVENOUS | Status: AC
  Administered 2022-08-30: 0.4 mg via INTRAVENOUS
  Filled 2022-08-30: qty 5

## 2022-08-30 NOTE — Progress Notes (Signed)
Pre-Stress MRI HR and BP

## 2022-08-30 NOTE — Progress Notes (Signed)
Care Coordination Pharmacy Assistant   Name: Penny Reed  MRN: [id number] DOB: Nov 10, 1945  Reason for Encounter: Medication Management   I received a task from [name], CPP requesting that I contact the patient and inform her of the following:   "please let her know that Lillia Abed and I would like her to decrease her benazepril to 10 mg daily. She can split her current tablets in half if she likes and I sent in a new Rx. She should monitor her blood pressure 1-2 times weekly and record and then bring in to her follow-up with Lillia Abed next month. "  I called the patient but had to leave a detailed voicemail per HIPAA. On the voicemail I advised the patient that per CPP and PCP they would like her to decrease her Benazepril to 10 mg daily and she can cut her 20 mg tablet in half if she would like. I also advised her on the message that CPP did send in a new prescription to her pharmacy for Benazepril 10 mg. I requested patient to give me a call back when she can in order to verify she understood my message.  [id number]  Patient called me back and she stated she understands the instructions that were provided to her via voicemail. Patient confirms that she will decrease her Benazepril to 10 mg daily, and she will keep a track of her BP readings to provide to [place] at her next appointment. Patient has no concerns or issues.   Medications: Outpatient Encounter Medications as of 08/30/2022  Medication Sig   albuterol (PROAIR HFA) 108 (90 BASE) MCG/ACT inhaler Inhale 2 puffs into the lungs every 6 (six) hours as needed for wheezing or shortness of breath.   amLODipine (NORVASC) 10 MG tablet Take 1 tablet (10 mg total) by mouth daily.   atorvastatin (LIPITOR) 80 MG tablet Take 1 tablet (80 mg total) by mouth daily.   Azelastine HCl 137 MCG/SPRAY SOLN SPRAY 2 SPRAYS INTO EACH NOSTRIL EVERY DAY   benazepril (LOTENSIN) 10 MG tablet Take 1 tablet (10 mg total) by mouth daily.   Calcium Carbonate (CALCIUM  600 PO) Take 600 mg by mouth in the morning and at bedtime.   cetirizine (ZYRTEC) 10 MG tablet Take 10 mg by mouth daily as needed for allergies.   cholecalciferol (VITAMIN D3) 25 MCG (1000 UNIT) tablet Take 1,000 Units by mouth daily.   furosemide (LASIX) 40 MG tablet Take 1 tablet (40 mg total) by mouth daily as needed (swelling/weight gain).   levothyroxine (SYNTHROID) 112 MCG tablet TAKE 1 TABLET BY MOUTH SIX DAYS PER WEEK. TAKE 1 AND 1/2 TABLET BY MOUTH ONE DAY PER WEEK [facility])   Multiple Vitamin (MULTIVITAMIN) tablet Take 1 tablet by mouth daily. Standard Process   nitroGLYCERIN (NITROSTAT) 0.4 MG SL tablet Place 0.4 mg under the tongue every 5 (five) minutes as needed for chest pain.    Omega-3 Fatty Acids (FISH OIL) 1000 MG CAPS Take 3,000 mg by mouth at bedtime.   rivaroxaban (XARELTO) 20 MG TABS tablet TAKE 1 TABLET BY MOUTH DAILY WITH SUPPER. STOP ELIQUIS   umeclidinium-vilanterol (ANORO ELLIPTA) 62.5-25 MCG/ACT AEPB INHALE 1 PUFF BY MOUTH EVERY DAY   zolpidem (AMBIEN) 5 MG tablet TAKE 1 TABLET BY MOUTH AT BEDTIME AS NEEDED FOR SLEEP   No facility-administered encounter medications on file as of 08/30/2022.    Adelene Idler, CPA/CMA Clinical Pharmacist Assistant Phone: [phone]

## 2022-08-30 NOTE — Progress Notes (Signed)
1 minute post regadenoson administration. Dr. Bjorn Pippin at scanner side. Patient states she is doing ok.

## 2022-08-30 NOTE — Progress Notes (Signed)
Patient presents for stress MRI.  BP 128/97, HR 52.  No caffeine intake in prior 12 hours. No wheezing on exam.  EKG today shows sinus bradycardia with PACs, rate 52.   Shared Decision Making/Informed Consent The risks [chest pain, shortness of breath, cardiac arrhythmias, dizziness, blood pressure fluctuations, myocardial infarction, stroke/transient ischemic attack, nausea, vomiting, allergic reaction, and life-threatening complications (estimated to be 1 in 10,000)], benefits (risk stratification, diagnosing coronary artery disease, treatment guidance) and alternatives of a MRI stress test were discussed in detail with patient and they agree to proceed.

## 2022-09-01 ENCOUNTER — Encounter: Payer: Self-pay | Admitting: Physician Assistant

## 2022-09-02 DIAGNOSIS — R69 Illness, unspecified: Secondary | ICD-10-CM | POA: Diagnosis not present

## 2022-09-09 DIAGNOSIS — R0789 Other chest pain: Secondary | ICD-10-CM | POA: Diagnosis not present

## 2022-09-09 DIAGNOSIS — Z87891 Personal history of nicotine dependence: Secondary | ICD-10-CM | POA: Diagnosis not present

## 2022-09-09 DIAGNOSIS — K219 Gastro-esophageal reflux disease without esophagitis: Secondary | ICD-10-CM | POA: Diagnosis not present

## 2022-09-09 DIAGNOSIS — I2510 Atherosclerotic heart disease of native coronary artery without angina pectoris: Secondary | ICD-10-CM | POA: Diagnosis not present

## 2022-09-09 DIAGNOSIS — I739 Peripheral vascular disease, unspecified: Secondary | ICD-10-CM | POA: Diagnosis not present

## 2022-09-09 DIAGNOSIS — I10 Essential (primary) hypertension: Secondary | ICD-10-CM | POA: Diagnosis not present

## 2022-09-09 DIAGNOSIS — Z951 Presence of aortocoronary bypass graft: Secondary | ICD-10-CM | POA: Diagnosis not present

## 2022-09-09 DIAGNOSIS — I4819 Other persistent atrial fibrillation: Secondary | ICD-10-CM | POA: Diagnosis not present

## 2022-09-09 DIAGNOSIS — J449 Chronic obstructive pulmonary disease, unspecified: Secondary | ICD-10-CM | POA: Diagnosis not present

## 2022-09-09 DIAGNOSIS — R0602 Shortness of breath: Secondary | ICD-10-CM | POA: Diagnosis not present

## 2022-09-09 DIAGNOSIS — R001 Bradycardia, unspecified: Secondary | ICD-10-CM | POA: Diagnosis not present

## 2022-09-13 DIAGNOSIS — I10 Essential (primary) hypertension: Secondary | ICD-10-CM | POA: Diagnosis not present

## 2022-09-13 DIAGNOSIS — J811 Chronic pulmonary edema: Secondary | ICD-10-CM | POA: Diagnosis not present

## 2022-09-13 DIAGNOSIS — R0602 Shortness of breath: Secondary | ICD-10-CM | POA: Diagnosis not present

## 2022-09-13 DIAGNOSIS — I5032 Chronic diastolic (congestive) heart failure: Secondary | ICD-10-CM | POA: Diagnosis not present

## 2022-09-13 DIAGNOSIS — M25473 Effusion, unspecified ankle: Secondary | ICD-10-CM | POA: Diagnosis not present

## 2022-09-22 ENCOUNTER — Other Ambulatory Visit: Payer: Self-pay | Admitting: Physician Assistant

## 2022-09-22 DIAGNOSIS — Z1231 Encounter for screening mammogram for malignant neoplasm of breast: Secondary | ICD-10-CM

## 2022-09-27 ENCOUNTER — Ambulatory Visit (INDEPENDENT_AMBULATORY_CARE_PROVIDER_SITE_OTHER): Payer: Medicare HMO

## 2022-09-27 ENCOUNTER — Other Ambulatory Visit (HOSPITAL_COMMUNITY): Payer: Self-pay | Admitting: Cardiology

## 2022-09-27 VITALS — Ht 67.0 in | Wt 181.0 lb

## 2022-09-27 DIAGNOSIS — I4819 Other persistent atrial fibrillation: Secondary | ICD-10-CM

## 2022-09-27 DIAGNOSIS — Z0000 Encounter for general adult medical examination without abnormal findings: Secondary | ICD-10-CM | POA: Diagnosis not present

## 2022-09-27 DIAGNOSIS — H9193 Unspecified hearing loss, bilateral: Secondary | ICD-10-CM

## 2022-09-27 NOTE — Patient Instructions (Signed)
Penny Reed , Thank you for taking time to come for your Medicare Wellness Visit. I appreciate your ongoing commitment to your health goals. Please review the following plan we discussed and let me know if I can assist you in the future.   These are the goals we discussed:  Goals      Cut out extra servings     Recommend to continue with weight watchers diet and cutting out carbohydrates.      Track and Manage My Symptoms-COPD     Timeframe:  Long-Range Goal Priority:  High Start Date: 12/15/20                            Expected End Date: 12/15/2021                      Follow Up within 90 days   - begin a symptom diary - develop a rescue plan - eliminate symptom triggers at home - follow rescue plan if symptoms flare-up    Why is this important?   Tracking your symptoms and other information about your health helps your doctor plan your care.  Write down the symptoms, the time of day, what you were doing and what medicine you are taking.  You will soon learn how to manage your symptoms.     Notes:         This is a list of the screening recommended for you and due dates:  Health Maintenance  Topic Date Due   COVID-19 Vaccine (5 - 2023-24 season) 07/01/2022   Zoster (Shingles) Vaccine (2 of 2) 07/01/2022   Mammogram  11/05/2022   Flu Shot  11/24/2022   Medicare Annual Wellness Visit  09/27/2023   DTaP/Tdap/Td vaccine (3 - Td or Tdap) 01/03/2028   Colon Cancer Screening  12/03/2029   Pneumonia Vaccine  Completed   DEXA scan (bone density measurement)  Completed   Hepatitis C Screening  Completed   HPV Vaccine  Aged Out    Advanced directives: yes Conditions/risks identified: low falls risk   Next appointment: Follow up in one year for your annual wellness visit 10/02/2023  @ 2pm telephone   Preventive Care 65 Years and Older, Female Preventive care refers to lifestyle choices and visits with your health care provider that can promote health and wellness. What  does preventive care include? A yearly physical exam. This is also called an annual well check. Dental exams once or twice a year. Routine eye exams. Ask your health care provider how often you should have your eyes checked. Personal lifestyle choices, including: Daily care of your teeth and gums. Regular physical activity. Eating a healthy diet. Avoiding tobacco and drug use. Limiting alcohol use. Practicing safe sex. Taking low-dose aspirin every day. Taking vitamin and mineral supplements as recommended by your health care provider. What happens during an annual well check? The services and screenings done by your health care provider during your annual well check will depend on your age, overall health, lifestyle risk factors, and family history of disease. Counseling  Your health care provider may ask you questions about your: Alcohol use. Tobacco use. Drug use. Emotional well-being. Home and relationship well-being. Sexual activity. Eating habits. History of falls. Memory and ability to understand (cognition). Work and work Astronomer. Reproductive health. Screening  You may have the following tests or measurements: Height, weight, and BMI. Blood pressure. Lipid and cholesterol levels. These may be  checked every 5 years, or more frequently if you are over 38 years old. Skin check. Lung cancer screening. You may have this screening every year starting at age 67 if you have a 30-pack-year history of smoking and currently smoke or have quit within the past 15 years. Fecal occult blood test (FOBT) of the stool. You may have this test every year starting at age 35. Flexible sigmoidoscopy or colonoscopy. You may have a sigmoidoscopy every 5 years or a colonoscopy every 10 years starting at age 38. Hepatitis C blood test. Hepatitis B blood test. Sexually transmitted disease (STD) testing. Diabetes screening. This is done by checking your blood sugar (glucose) after you have  not eaten for a while (fasting). You may have this done every 1-3 years. Bone density scan. This is done to screen for osteoporosis. You may have this done starting at age 75. Mammogram. This may be done every 1-2 years. Talk to your health care provider about how often you should have regular mammograms. Talk with your health care provider about your test results, treatment options, and if necessary, the need for more tests. Vaccines  Your health care provider may recommend certain vaccines, such as: Influenza vaccine. This is recommended every year. Tetanus, diphtheria, and acellular pertussis (Tdap, Td) vaccine. You may need a Td booster every 10 years. Zoster vaccine. You may need this after age 38. Pneumococcal 13-valent conjugate (PCV13) vaccine. One dose is recommended after age 77. Pneumococcal polysaccharide (PPSV23) vaccine. One dose is recommended after age 29. Talk to your health care provider about which screenings and vaccines you need and how often you need them. This information is not intended to replace advice given to you by your health care provider. Make sure you discuss any questions you have with your health care provider. Document Released: 05/08/2015 Document Revised: 12/30/2015 Document Reviewed: 02/10/2015 Elsevier Interactive Patient Education  2017 [employer].  Fall Prevention in the Home Falls can cause injuries. They can happen to people of all ages. There are many things you can do to make your home safe and to help prevent falls. What can I do on the outside of my home? Regularly fix the edges of walkways and driveways and fix any cracks. Remove anything that might make you trip as you walk through a door, such as a raised step or threshold. Trim any bushes or trees on the path to your home. Use bright outdoor lighting. Clear any walking paths of anything that might make someone trip, such as rocks or tools. Regularly check to see if handrails are loose or  broken. Make sure that both sides of any steps have handrails. Any raised decks and porches should have guardrails on the edges. Have any leaves, snow, or ice cleared regularly. Use sand or salt on walking paths during winter. Clean up any spills in your garage right away. This includes oil or grease spills. What can I do in the bathroom? Use night lights. Install grab bars by the toilet and in the tub and shower. Do not use towel bars as grab bars. Use non-skid mats or decals in the tub or shower. If you need to sit down in the shower, use a plastic, non-slip stool. Keep the floor dry. Clean up any water that spills on the floor as soon as it happens. Remove soap buildup in the tub or shower regularly. Attach bath mats securely with double-sided non-slip rug tape. Do not have throw rugs and other things on the floor that  can make you trip. What can I do in the bedroom? Use night lights. Make sure that you have a light by your bed that is easy to reach. Do not use any sheets or blankets that are too big for your bed. They should not hang down onto the floor. Have a firm chair that has side arms. You can use this for support while you get dressed. Do not have throw rugs and other things on the floor that can make you trip. What can I do in the kitchen? Clean up any spills right away. Avoid walking on wet floors. Keep items that you use a lot in easy-to-reach places. If you need to reach something above you, use a strong step stool that has a grab bar. Keep electrical cords out of the way. Do not use floor polish or wax that makes floors slippery. If you must use wax, use non-skid floor wax. Do not have throw rugs and other things on the floor that can make you trip. What can I do with my stairs? Do not leave any items on the stairs. Make sure that there are handrails on both sides of the stairs and use them. Fix handrails that are broken or loose. Make sure that handrails are as long as  the stairways. Check any carpeting to make sure that it is firmly attached to the stairs. Fix any carpet that is loose or worn. Avoid having throw rugs at the top or bottom of the stairs. If you do have throw rugs, attach them to the floor with carpet tape. Make sure that you have a light switch at the top of the stairs and the bottom of the stairs. If you do not have them, ask someone to add them for you. What else can I do to help prevent falls? Wear shoes that: Do not have high heels. Have rubber bottoms. Are comfortable and fit you well. Are closed at the toe. Do not wear sandals. If you use a stepladder: Make sure that it is fully opened. Do not climb a closed stepladder. Make sure that both sides of the stepladder are locked into place. Ask someone to hold it for you, if possible. Clearly mark and make sure that you can see: Any grab bars or handrails. First and last steps. Where the edge of each step is. Use tools that help you move around (mobility aids) if they are needed. These include: Canes. Walkers. Scooters. Crutches. Turn on the lights when you go into a dark area. Replace any light bulbs as soon as they burn out. Set up your furniture so you have a clear path. Avoid moving your furniture around. If any of your floors are uneven, fix them. If there are any pets around you, be aware of where they are. Review your medicines with your doctor. Some medicines can make you feel dizzy. This can increase your chance of falling. Ask your doctor what other things that you can do to help prevent falls. This information is not intended to replace advice given to you by your health care provider. Make sure you discuss any questions you have with your health care provider. Document Released: 02/05/2009 Document Revised: 09/17/2015 Document Reviewed: 05/16/2014 Elsevier Interactive Patient Education  2017 [employer].

## 2022-09-27 NOTE — Telephone Encounter (Signed)
Prescription refill request for Xarelto received.  Indication:afib Last office visit:5/24 Weight:82.1  kg Age:77 Scr:0.86 CrCl:73.26  ml/min  Prescription refilled

## 2022-09-27 NOTE — Progress Notes (Signed)
I connected with  Toma Aran on 09/27/22 by a audio enabled telemedicine application and verified that I am speaking with the correct person using two identifiers.  Patient Location: Home  Provider Location: Office/Clinic  I discussed the limitations of evaluation and management by telemedicine. The patient expressed understanding and agreed to proceed.  Subjective:   Penny Reed is a 77 y.o. female who presents for Medicare Annual (Subsequent) preventive examination.  Review of Systems    Cardiac Risk Factors include: advanced age (>80men, >34 women);dyslipidemia;hypertension    Objective:    Today's Vitals   09/27/22 1432  Weight: 181 lb (82.1 kg)  Height: 5\' 7"  (1.702 m)   Body mass index is 28.35 kg/m.     09/27/2022    2:46 PM 07/28/2021    9:38 AM 05/31/2021    5:36 AM 09/04/2020   11:52 AM 12/04/2019    8:34 AM 09/04/2019    2:11 PM 07/23/2019   12:33 PM  Advanced Directives  Does Patient Have a Medical Advance Directive? Yes Yes Yes Yes Yes Yes Yes  Type of Estate agent of [place];Living will Healthcare Power of [place];Living will Healthcare Power of [place];Living will Healthcare Power of [place];Living will Healthcare Power of [employer] of [place];Living will Healthcare Power of [place];Living will  Does patient want to make changes to medical advance directive?   No - Patient declined No - Patient declined   No - Patient declined  Copy of Healthcare Power of Attorney in Chart? No - copy requested No - copy requested  No - copy requested No - copy requested No - copy requested No - copy requested  Would patient like information on creating a medical advance directive?       No - Patient declined    Current Medications (verified) Outpatient Encounter Medications as of 09/27/2022  Medication Sig   albuterol (PROAIR HFA) 108 (90 BASE) MCG/ACT inhaler Inhale 2 puffs into the lungs every 6 (six) hours as needed for  wheezing or shortness of breath.   amLODipine (NORVASC) 10 MG tablet Take 1 tablet (10 mg total) by mouth daily.   atorvastatin (LIPITOR) 80 MG tablet Take 1 tablet (80 mg total) by mouth daily.   Azelastine HCl 137 MCG/SPRAY SOLN SPRAY 2 SPRAYS INTO EACH NOSTRIL EVERY DAY   benazepril (LOTENSIN) 10 MG tablet Take 1 tablet (10 mg total) by mouth daily.   Calcium Carbonate (CALCIUM 600 PO) Take 600 mg by mouth in the morning and at bedtime.   cetirizine (ZYRTEC) 10 MG tablet Take 10 mg by mouth daily as needed for allergies.   cholecalciferol (VITAMIN D3) 25 MCG (1000 UNIT) tablet Take 1,000 Units by mouth daily.   empagliflozin (JARDIANCE) 10 MG TABS tablet Take 1 tablet by mouth daily.   furosemide (LASIX) 40 MG tablet Take 1 tablet (40 mg total) by mouth daily as needed (swelling/weight gain).   levothyroxine (SYNTHROID) 112 MCG tablet TAKE 1 TABLET BY MOUTH SIX DAYS PER WEEK. TAKE 1 AND 1/2 TABLET BY MOUTH ONE DAY PER WEEK [facility])   Multiple Vitamin (MULTIVITAMIN) tablet Take 1 tablet by mouth daily. Standard Process   nitroGLYCERIN (NITROSTAT) 0.4 MG SL tablet Place 0.4 mg under the tongue every 5 (five) minutes as needed for chest pain.    Omega-3 Fatty Acids (FISH OIL) 1000 MG CAPS Take 3,000 mg by mouth at bedtime.   rivaroxaban (XARELTO) 20 MG TABS tablet TAKE 1 TABLET BY MOUTH DAILY WITH SUPPER. STOP ELIQUIS  umeclidinium-vilanterol (ANORO ELLIPTA) 62.5-25 MCG/ACT AEPB INHALE 1 PUFF BY MOUTH EVERY DAY   zolpidem (AMBIEN) 5 MG tablet TAKE 1 TABLET BY MOUTH AT BEDTIME AS NEEDED FOR SLEEP   [DISCONTINUED] rivaroxaban (XARELTO) 20 MG TABS tablet TAKE 1 TABLET BY MOUTH DAILY WITH SUPPER. STOP ELIQUIS   No facility-administered encounter medications on file as of 09/27/2022.    Allergies (verified) Adhesive [tape], Azithromycin, and Eliquis [apixaban]   History: Past Medical History:  Diagnosis Date   Allergic rhinitis    CAD (coronary artery disease)    Complication of  anesthesia    woke up during colonoscopy   Hyperlipidemia    Hypertension    Hypothyroidism    LVH (left ventricular hypertrophy)    Past Surgical History:  Procedure Laterality Date   ABDOMINAL HYSTERECTOMY     has left ovary   APPENDECTOMY     ATRIAL FIBRILLATION ABLATION N/A 05/31/2021   Procedure: ATRIAL FIBRILLATION ABLATION;  Surgeon: Lanier Prude, MD;  Location: [facility] INVASIVE CV LAB;  Service: Cardiovascular;  Laterality: N/A;   CARDIAC CATHETERIZATION  09/2005   had 1 stent and 3 balloons placed   CARDIOVERSION N/A 04/02/2021   Procedure: CARDIOVERSION;  Surgeon: Meriam Sprague, MD;  Location: [facility] ENDOSCOPY;  Service: Cardiovascular;  Laterality: N/A;   CARDIOVERSION N/A 07/28/2021   Procedure: CARDIOVERSION;  Surgeon: Maisie Fus, MD;  Location: [facility] ENDOSCOPY;  Service: Cardiovascular;  Laterality: N/A;   COLONOSCOPY WITH PROPOFOL N/A 12/04/2019   Procedure: COLONOSCOPY WITH PROPOFOL;  Surgeon: Earline Mayotte, MD;  Location: [facility] ENDOSCOPY;  Service: Endoscopy;  Laterality: N/A;   CORONARY ARTERY BYPASS GRAFT     SVT ABLATION N/A 09/04/2020   Procedure: SVT ABLATION;  Surgeon: Lanier Prude, MD;  Location: [facility] INVASIVE CV LAB;  Service: Cardiovascular;  Laterality: N/A;   TONSILLECTOMY     Family History  Problem Relation Age of Onset   Diabetes Mother    Stroke Mother        cause of death   Coronary artery disease Father    Heart attack Father    Hyperlipidemia Sister    Hypertension Sister    Breast cancer Sister 29   Cancer Sister        breast cancer   Asthma Sister    Scleroderma Sister    Thyroid disease Sister    Heart attack Brother    Cancer - Colon Paternal Grandmother    Breast cancer Maternal Aunt        67s   Social History   Socioeconomic History   Marital status: Married    Spouse name: Not on file   Number of children: 3   Years of education: Not on file   Highest education level: Associate degree: occupational, Scientist, product/process development, or  vocational program  Occupational History   Not on file  Tobacco Use   Smoking status: Former    Packs/day: 0.25    Years: 25.00    Additional pack years: 0.00    Total pack years: 6.25    Types: Cigarettes    Quit date: 04/25/2005    Years since quitting: 17.4   Smokeless tobacco: Never   Tobacco comments:    Former smoker 06/28/21  Vaping Use   Vaping Use: Never used  Substance and Sexual Activity   Alcohol use: Yes    Alcohol/week: 6.0 - 14.0 standard drinks of alcohol    Types: 6 - 14 Glasses of wine per week    Comment: 2  glasses in a setting over 3-7 days a week 06/28/21   Drug use: No   Sexual activity: Not on file  Other Topics Concern   Not on file  Social History Narrative   Not on file   Social Determinants of Health   Financial Resource Strain: Low Risk  (09/27/2022)   Overall Financial Resource Strain (CARDIA)    Difficulty of Paying Living Expenses: Not hard at all  Food Insecurity: No Food Insecurity (09/27/2022)   Hunger Vital Sign    Worried About Running Out of Food in the Last Year: Never true    Ran Out of Food in the Last Year: Never true  Transportation Needs: No Transportation Needs (09/27/2022)   PRAPARE - Administrator, Civil Service (Medical): No    Lack of Transportation (Non-Medical): No  Physical Activity: Sufficiently Active (09/27/2022)   Exercise Vital Sign    Days of Exercise per Week: 6 days    Minutes of Exercise per Session: 60 min  Stress: No Stress Concern Present (09/27/2022)   [employer] of Occupational Health - Occupational Stress Questionnaire    Feeling of Stress : Not at all  Social Connections: Moderately Integrated (09/27/2022)   Social Connection and Isolation Panel [NHANES]    Frequency of Communication with Friends and Family: More than three times a week    Frequency of Social Gatherings with Friends and Family: Three times a week    Attends Religious Services: Never    Active Member of Clubs or Organizations:  Yes    Attends Banker Meetings: More than 4 times per year    Marital Status: Married    Tobacco Counseling Counseling given: Not Answered Tobacco comments: Former smoker 06/28/21   Clinical Intake:  Pre-visit preparation completed: Yes  Pain : No/denies pain     BMI - recorded: 28.35 Nutritional Status: BMI 25 -29 Overweight Nutritional Risks: None Diabetes: No  How often do you need to have someone help you when you read instructions, pamphlets, or other written materials from your doctor or pharmacy?: 1 - Never  Diabetic?no  Interpreter Needed?: No  Comments: lives with husband Information entered by :: B.Sarim Rothman,LPN   Activities of Daily Living    09/27/2022    2:48 PM 04/06/2022    9:30 AM  In your present state of health, do you have any difficulty performing the following activities:  Hearing? 1 1  Vision? 0 1  Difficulty concentrating or making decisions? 0 1  Walking or climbing stairs? 0 1  Dressing or bathing? 0 0  Doing errands, shopping? 0 0  Preparing Food and eating ? N   Using the Toilet? N   In the past six months, have you accidently leaked urine? N   Do you have problems with loss of bowel control? N   Managing your Medications? N   Managing your Finances? N   Housekeeping or managing your Housekeeping? N     Patient Care Team: [name], PA-C as PCP - General (Physician Assistant) Lanier Prude, MD as PCP - Electrophysiology (Cardiology) Alwyn Pea, MD as PCP - Cardiology (Cardiology) Domingo Madeira, OD as Consulting Physician (Optometry) Alwyn Pea, MD as Consulting Physician (Cardiology) Tedd Sias Marlana Salvage, MD as Physician Assistant (Endocrinology) Lawrence Marseilles, DMD (Dentistry) Gaspar Cola, [facility] (Pharmacist)  Indicate any recent Medical Services you may have received from other than [facility] providers in the past year (date may be approximate).  Assessment:   This is a routine  wellness examination for [place].  Hearing/Vision screen Hearing Screening - Comments:: Adequate hearing but little loss Needs referral Vision Screening - Comments:: Adequate vision w/glasses Dr Larence Penning  Dietary issues and exercise activities discussed: Current Exercise Habits: Home exercise routine;Structured exercise class, Type of exercise: calisthenics;strength training/weights;stretching;treadmill;walking, Time (Minutes): > 60, Frequency (Times/Week): 6, Weekly Exercise (Minutes/Week): 0, Intensity: Mild, Exercise limited by: cardiac condition(s);respiratory conditions(s)   Goals Addressed             This Visit's Progress    Cut out extra servings   On track    Recommend to continue with weight watchers diet and cutting out carbohydrates.        Depression Screen    09/27/2022    2:42 PM 04/06/2022    9:29 AM 09/28/2021   10:08 AM 03/23/2021    8:26 AM 09/16/2020    9:22 AM 09/04/2019    2:10 PM 09/05/2018   10:43 AM  PHQ 2/9 Scores  PHQ - 2 Score 0 0 0 0 0 0 0  PHQ- 9 Score  2 2 3  0  1    Fall Risk    09/27/2022    2:36 PM 04/06/2022    9:29 AM 09/28/2021   10:08 AM 09/26/2021    2:55 PM 03/23/2021    8:25 AM  Fall Risk   Falls in the past year? 0 0 0 0 0  Number falls in past yr: 0 0 0  0  Injury with Fall? 0 0 0  0  Risk for fall due to : No Fall Risks  No Fall Risks  No Fall Risks  Follow up Education provided;Falls prevention discussed  Falls evaluation completed  Falls evaluation completed    FALL RISK PREVENTION PERTAINING TO THE HOME:  Any stairs in or around the home? Yes  If so, are there any without handrails? Yes  Home free of loose throw rugs in walkways, pet beds, electrical cords, etc? Yes  Adequate lighting in your home to reduce risk of falls? Yes   ASSISTIVE DEVICES UTILIZED TO PREVENT FALLS:  Life alert? No  Use of a cane, walker or w/c? No  Grab bars in the bathroom? No  Shower chair or bench in shower? No  Elevated toilet seat or a  handicapped toilet? No     Cognitive Function:        09/27/2022    2:49 PM 09/04/2019    2:17 PM 07/12/2017    8:44 AM  6CIT Screen  What Year? 0 points 0 points 0 points  What month? 0 points 0 points 0 points  What time? 0 points 0 points 0 points  Count back from 20 0 points 0 points 0 points  Months in reverse 0 points 0 points 0 points  Repeat phrase 0 points 0 points 0 points  Total Score 0 points 0 points 0 points    Immunizations Immunization History  Administered Date(s) Administered   Fluad Quad(high Dose 65+) 01/11/2019, 03/23/2021, 02/01/2022   Influenza, High Dose Seasonal PF 02/23/2017, 01/02/2018, 01/17/2020   Influenza,inj,Quad PF,6+ Mos 03/20/2013   Influenza-Unspecified 12/24/2013, 12/24/2013, 01/24/2015   Moderna Covid-19 Vaccine Bivalent Booster 58yrs & up 05/04/2021   PFIZER(Purple Top)SARS-COV-2 Vaccination 06/18/2019, 07/09/2019   Pneumococcal Conjugate-13 03/20/2013   Pneumococcal Polysaccharide-23 04/02/2014   Pneumococcal-Unspecified 03/24/2013   Respiratory Syncytial Virus Vaccine,Recomb Aduvanted(Arexvy) 02/22/2022   Td 07/10/2003   Tdap 01/02/2018   Unspecified SARS-COV-2 Vaccination 05/06/2022  Zoster Recombinat (Shingrix) 05/06/2022    TDAP status: Up to date  Flu Vaccine status: Up to date  Pneumococcal vaccine status: Up to date  Covid-19 vaccine status: Completed vaccines  Qualifies for Shingles Vaccine? Yes   Zostavax completed Yes   Shingrix Completed?: Yes  Screening Tests Health Maintenance  Topic Date Due   COVID-19 Vaccine (5 - 2023-24 season) 07/01/2022   Zoster Vaccines- Shingrix (2 of 2) 07/01/2022   MAMMOGRAM  11/05/2022   INFLUENZA VACCINE  11/24/2022   Medicare Annual Wellness (AWV)  09/27/2023   DTaP/Tdap/Td (3 - Td or Tdap) 01/03/2028   Colonoscopy  12/03/2029   Pneumonia Vaccine 58+ Years old  Completed   DEXA SCAN  Completed   Hepatitis C Screening  Completed   HPV VACCINES  Aged Out    Health  Maintenance  Health Maintenance Due  Topic Date Due   COVID-19 Vaccine (5 - 2023-24 season) 07/01/2022   Zoster Vaccines- Shingrix (2 of 2) 07/01/2022    Colorectal cancer screening: No longer required.   Mammogram status: No longer required due to age.  Bone Density: Never Done  Lung Cancer Screening: (Low Dose CT Chest recommended if Age 37-80 years, 30 pack-year currently smoking OR have quit w/in 15years.) does not qualify.   Lung Cancer Screening Referral: no  Additional Screening:  Hepatitis C Screening: does not qualify; Completed yes  Vision Screening: Recommended annual ophthalmology exams for early detection of glaucoma and other disorders of the eye. Is the patient up to date with their annual eye exam?  Yes  Who is the provider or what is the name of the office in which the patient attends annual eye exams? Dr Larence Penning If pt is not established with a provider, would they like to be referred to a provider to establish care? No .   Dental Screening: Recommended annual dental exams for proper oral hygiene  Community Resource Referral / Chronic Care Management: CRR required this visit?  No   CCM required this visit?  No      Plan:     I have personally reviewed and noted the following in the patient's chart:   Medical and social history Use of alcohol, tobacco or illicit drugs  Current medications and supplements including opioid prescriptions. Patient is not currently taking opioid prescriptions. Functional ability and status Nutritional status Physical activity Advanced directives List of other physicians Hospitalizations, surgeries, and ER visits in previous 12 months Vitals Screenings to include cognitive, depression, and falls Referrals and appointments  In addition, I have reviewed and discussed with patient certain preventive protocols, quality metrics, and best practice recommendations. A written personalized care plan for preventive services as well  as general preventive health recommendations were provided to patient.     Sue Lush, LPN   [id number]   Nurse Notes: Pt reports hearing loss and needing a hearing test. She also relays she is out of Ambien and desires layover refill until appt with PCP on 10/05/22.   *Referral to Audiology made

## 2022-10-04 NOTE — Progress Notes (Unsigned)
I,Penny Reed,acting as a Neurosurgeon for [employer], PA-C.,have documented all relevant documentation on the behalf of Penny Ferguson, PA-C,as directed by  Penny Ferguson, PA-C while in the presence of Penny Ferguson, PA-C.    Complete physical exam   Patient: Penny Reed   DOB: 02/14/1946   78 y.o. Female  MRN: [id number] Visit Date: 10/05/2022  Today's healthcare provider: [name], PA-C   No chief complaint on file.  Subjective    Penny Reed is a 77 y.o. female who presents today for a complete physical exam.  She reports consuming a {diet types:17450} diet. {Exercise:19826} She generally feels {well/fairly well/poorly:18703}. She reports sleeping {well/fairly well/poorly:18703}. She {does/does not:200015} have additional problems to discuss today.  HPI  ***  Past Medical History:  Diagnosis Date   Allergic rhinitis    CAD (coronary artery disease)    Complication of anesthesia    woke up during colonoscopy   Hyperlipidemia    Hypertension    Hypothyroidism    LVH (left ventricular hypertrophy)    Past Surgical History:  Procedure Laterality Date   ABDOMINAL HYSTERECTOMY     has left ovary   APPENDECTOMY     ATRIAL FIBRILLATION ABLATION N/A 05/31/2021   Procedure: ATRIAL FIBRILLATION ABLATION;  Surgeon: Lanier Prude, MD;  Location: [facility] INVASIVE CV LAB;  Service: Cardiovascular;  Laterality: N/A;   CARDIAC CATHETERIZATION  09/2005   had 1 stent and 3 balloons placed   CARDIOVERSION N/A 04/02/2021   Procedure: CARDIOVERSION;  Surgeon: Meriam Sprague, MD;  Location: [facility] ENDOSCOPY;  Service: Cardiovascular;  Laterality: N/A;   CARDIOVERSION N/A 07/28/2021   Procedure: CARDIOVERSION;  Surgeon: Maisie Fus, MD;  Location: [facility] ENDOSCOPY;  Service: Cardiovascular;  Laterality: N/A;   COLONOSCOPY WITH PROPOFOL N/A 12/04/2019   Procedure: COLONOSCOPY WITH PROPOFOL;  Surgeon: Earline Mayotte, MD;  Location: [facility] ENDOSCOPY;  Service: Endoscopy;   Laterality: N/A;   CORONARY ARTERY BYPASS GRAFT     SVT ABLATION N/A 09/04/2020   Procedure: SVT ABLATION;  Surgeon: Lanier Prude, MD;  Location: [facility] INVASIVE CV LAB;  Service: Cardiovascular;  Laterality: N/A;   TONSILLECTOMY     Social History   Socioeconomic History   Marital status: Married    Spouse name: Not on file   Number of children: 3   Years of education: Not on file   Highest education level: Associate degree: academic program  Occupational History   Not on file  Tobacco Use   Smoking status: Former    Packs/day: 0.25    Years: 25.00    Additional pack years: 0.00    Total pack years: 6.25    Types: Cigarettes    Quit date: 04/25/2005    Years since quitting: 17.4   Smokeless tobacco: Never   Tobacco comments:    Former smoker 06/28/21  Vaping Use   Vaping Use: Never used  Substance and Sexual Activity   Alcohol use: Yes    Alcohol/week: 6.0 - 14.0 standard drinks of alcohol    Types: 6 - 14 Glasses of wine per week    Comment: 2 glasses in a setting over 3-7 days a week 06/28/21   Drug use: No   Sexual activity: Not on file  Other Topics Concern   Not on file  Social History Narrative   Not on file   Social Determinants of Health   Financial Resource Strain: Low Risk  (10/04/2022)   Overall Financial Resource Strain (CARDIA)  Difficulty of Paying Living Expenses: Not hard at all  Food Insecurity: No Food Insecurity (10/04/2022)   Hunger Penny Reed Sign    Worried About Running Out of Food in the Last Year: Never true    Ran Out of Food in the Last Year: Never true  Transportation Needs: No Transportation Needs (10/04/2022)   PRAPARE - Administrator, Civil Service (Medical): No    Lack of Transportation (Non-Medical): No  Physical Activity: Sufficiently Active (10/04/2022)   Exercise Cayce Quezada Sign    Days of Exercise per Week: 6 days    Minutes of Exercise per Session: 60 min  Stress: No Stress Concern Present (10/04/2022)   [employer] of Occupational Health - Occupational Stress Questionnaire    Feeling of Stress : Not at all  Social Connections: Moderately Integrated (10/04/2022)   Social Connection and Isolation Panel [NHANES]    Frequency of Communication with Friends and Family: More than three times a week    Frequency of Social Gatherings with Friends and Family: Three times a week    Attends Religious Services: Never    Active Member of Clubs or Organizations: Yes    Attends Banker Meetings: More than 4 times per year    Marital Status: Married  Catering manager Violence: Not At Risk (09/27/2022)   Humiliation, Afraid, Rape, and Kick questionnaire    Fear of Current or Ex-Partner: No    Emotionally Abused: No    Physically Abused: No    Sexually Abused: No   Family Status  Relation Name Status   Mother  Deceased   Father  Deceased at age 18       from heart disease   Sister  Alive   Brother  Deceased at age 3       from MI   Sister  Alive   PGM  (Not Specified)   Youth worker  (Not Specified)   Family History  Problem Relation Age of Onset   Diabetes Mother    Stroke Mother        cause of death   Coronary artery disease Father    Heart attack Father    Hyperlipidemia Sister    Hypertension Sister    Breast cancer Sister 47   Cancer Sister        breast cancer   Asthma Sister    Scleroderma Sister    Thyroid disease Sister    Heart attack Brother    Cancer - Colon Paternal Grandmother    Breast cancer Maternal Aunt        87s   Allergies  Allergen Reactions   Adhesive [Tape]     Band-aids irritate skin   Azithromycin Diarrhea and Nausea And Vomiting   Eliquis [Apixaban] Swelling    Leg and ankle swelling    Patient Care Team: Penny Ferguson, PA-C as PCP - General (Physician Assistant) Lanier Prude, MD as PCP - Electrophysiology (Cardiology) Alwyn Pea, MD as PCP - Cardiology (Cardiology) Domingo Madeira, OD as Consulting Physician  (Optometry) Alwyn Pea, MD as Consulting Physician (Cardiology) Tedd Sias, Marlana Salvage, MD as Physician Assistant (Endocrinology) Lawrence Marseilles, DMD (Dentistry) Gaspar Cola, [facility] (Pharmacist)   Medications: Outpatient Medications Prior to Visit  Medication Sig   albuterol (PROAIR HFA) 108 (90 BASE) MCG/ACT inhaler Inhale 2 puffs into the lungs every 6 (six) hours as needed for wheezing or shortness of breath.   amLODipine (NORVASC) 10 MG tablet Take 1 tablet (  10 mg total) by mouth daily.   atorvastatin (LIPITOR) 80 MG tablet Take 1 tablet (80 mg total) by mouth daily.   Azelastine HCl 137 MCG/SPRAY SOLN SPRAY 2 SPRAYS INTO EACH NOSTRIL EVERY DAY   benazepril (LOTENSIN) 10 MG tablet Take 1 tablet (10 mg total) by mouth daily.   Calcium Carbonate (CALCIUM 600 PO) Take 600 mg by mouth in the morning and at bedtime.   cetirizine (ZYRTEC) 10 MG tablet Take 10 mg by mouth daily as needed for allergies.   cholecalciferol (VITAMIN D3) 25 MCG (1000 UNIT) tablet Take 1,000 Units by mouth daily.   empagliflozin (JARDIANCE) 10 MG TABS tablet Take 1 tablet by mouth daily.   furosemide (LASIX) 40 MG tablet Take 1 tablet (40 mg total) by mouth daily as needed (swelling/weight gain).   levothyroxine (SYNTHROID) 112 MCG tablet TAKE 1 TABLET BY MOUTH SIX DAYS PER WEEK. TAKE 1 AND 1/2 TABLET BY MOUTH ONE DAY PER WEEK [facility])   Multiple Vitamin (MULTIVITAMIN) tablet Take 1 tablet by mouth daily. Standard Process   nitroGLYCERIN (NITROSTAT) 0.4 MG SL tablet Place 0.4 mg under the tongue every 5 (five) minutes as needed for chest pain.    Omega-3 Fatty Acids (FISH OIL) 1000 MG CAPS Take 3,000 mg by mouth at bedtime.   rivaroxaban (XARELTO) 20 MG TABS tablet TAKE 1 TABLET BY MOUTH DAILY WITH SUPPER. STOP ELIQUIS   umeclidinium-vilanterol (ANORO ELLIPTA) 62.5-25 MCG/ACT AEPB INHALE 1 PUFF BY MOUTH EVERY DAY   zolpidem (AMBIEN) 5 MG tablet TAKE 1 TABLET BY MOUTH AT BEDTIME AS NEEDED FOR SLEEP   No  facility-administered medications prior to visit.    Review of Systems  {Labs  Heme  Chem  Endocrine  Serology  Results Review (optional):23779}  Objective    There were no vitals taken for this visit. {Show previous Lark Langenfeld signs (optional):23777}   Physical Exam  ***  Last depression screening scores    09/27/2022    2:42 PM 04/06/2022    9:29 AM 09/28/2021   10:08 AM  PHQ 2/9 Scores  PHQ - 2 Score 0 0 0  PHQ- 9 Score  2 2   Last fall risk screening    09/27/2022    2:36 PM  Fall Risk   Falls in the past year? 0  Number falls in past yr: 0  Injury with Fall? 0  Risk for fall due to : No Fall Risks  Follow up Education provided;Falls prevention discussed   Last Audit-C alcohol use screening    10/04/2022    3:23 PM  Alcohol Use Disorder Test (AUDIT)  1. How often do you have a drink containing alcohol? 4  2. How many drinks containing alcohol do you have on a typical day when you are drinking? 0  3. How often do you have six or more drinks on one occasion? 0  AUDIT-C Score 4  5. How often during the last year have you failed to do what was normally expected from you because of drinking? 0  6. How often during the last year have you needed a first drink in the morning to get yourself going after a heavy drinking session? 0  7. How often during the last year have you had a feeling of guilt of remorse after drinking? 0  8. How often during the last year have you been unable to remember what happened the night before because you had been drinking? 0  9. Have you or someone else been injured  as a result of your drinking? 0  10. Has a relative or friend or a doctor or another health worker been concerned about your drinking or suggested you cut down? 0  Alcohol Use Disorder Identification Test Final Score (AUDIT) 4   A score of 3 or more in women, and 4 or more in men indicates increased risk for alcohol abuse, EXCEPT if all of the points are from question 1   No results  found for any visits on 10/05/22.  Assessment & Plan    Routine Health Maintenance and Physical Exam  Exercise Activities and Dietary recommendations  Goals      Cut out extra servings     Recommend to continue with weight watchers diet and cutting out carbohydrates.       Track and Manage My Symptoms-COPD     Timeframe:  Long-Range Goal Priority:  High Start Date: 12/15/20                            Expected End Date: 12/15/2021                      Follow Up within 90 days   - begin a symptom diary - develop a rescue plan - eliminate symptom triggers at home - follow rescue plan if symptoms flare-up    Why is this important?   Tracking your symptoms and other information about your health helps your doctor plan your care.  Write down the symptoms, the time of day, what you were doing and what medicine you are taking.  You will soon learn how to manage your symptoms.     Notes:         Immunization History  Administered Date(s) Administered   Fluad Quad(high Dose 65+) 01/11/2019, 03/23/2021, 02/01/2022   Influenza, High Dose Seasonal PF 02/23/2017, 01/02/2018, 01/17/2020   Influenza,inj,Quad PF,6+ Mos 03/20/2013   Influenza-Unspecified 12/24/2013, 12/24/2013, 01/24/2015   Moderna Covid-19 Vaccine Bivalent Booster 73yrs & up 05/04/2021   PFIZER(Purple Top)SARS-COV-2 Vaccination 06/18/2019, 07/09/2019   Pneumococcal Conjugate-13 03/20/2013   Pneumococcal Polysaccharide-23 04/02/2014   Pneumococcal-Unspecified 03/24/2013   Respiratory Syncytial Virus Vaccine,Recomb Aduvanted(Arexvy) 02/22/2022   Td 07/10/2003   Tdap 01/02/2018   Unspecified SARS-COV-2 Vaccination 05/06/2022   Zoster Recombinat (Shingrix) 05/06/2022    Health Maintenance  Topic Date Due   COVID-19 Vaccine (5 - 2023-24 season) 07/01/2022   Zoster Vaccines- Shingrix (2 of 2) 07/01/2022   MAMMOGRAM  11/05/2022   INFLUENZA VACCINE  11/24/2022   Medicare Annual Wellness (AWV)  09/27/2023    DTaP/Tdap/Td (3 - Td or Tdap) 01/03/2028   Colonoscopy  12/03/2029   Pneumonia Vaccine 17+ Years old  Completed   DEXA SCAN  Completed   Hepatitis C Screening  Completed   HPV VACCINES  Aged Out    Discussed health benefits of physical activity, and encouraged her to engage in regular exercise appropriate for her age and condition.  ***  No follow-ups on file.     {provider attestation***:1}   Penny Ferguson, PA-C  [facility] [phone] (phone) [phone] (fax)  [facility]

## 2022-10-05 ENCOUNTER — Encounter: Payer: Self-pay | Admitting: Physician Assistant

## 2022-10-05 ENCOUNTER — Ambulatory Visit (INDEPENDENT_AMBULATORY_CARE_PROVIDER_SITE_OTHER): Payer: Medicare HMO | Admitting: Physician Assistant

## 2022-10-05 VITALS — BP 121/49 | HR 51 | Temp 98.5°F | Resp 13 | Ht 67.0 in | Wt 182.0 lb

## 2022-10-05 DIAGNOSIS — J449 Chronic obstructive pulmonary disease, unspecified: Secondary | ICD-10-CM

## 2022-10-05 DIAGNOSIS — G4700 Insomnia, unspecified: Secondary | ICD-10-CM | POA: Diagnosis not present

## 2022-10-05 DIAGNOSIS — I10 Essential (primary) hypertension: Secondary | ICD-10-CM | POA: Diagnosis not present

## 2022-10-05 MED ORDER — ZOLPIDEM TARTRATE 5 MG PO TABS
5.0000 mg | ORAL_TABLET | Freq: Every evening | ORAL | 3 refills | Status: AC | PRN
Start: 2022-10-05 — End: ?

## 2022-10-05 MED ORDER — AMLODIPINE BESYLATE 5 MG PO TABS
5.0000 mg | ORAL_TABLET | Freq: Every day | ORAL | 3 refills | Status: AC
Start: 2022-10-05 — End: ?

## 2022-10-05 NOTE — Assessment & Plan Note (Signed)
Refilled ambien, prn use

## 2022-10-05 NOTE — Assessment & Plan Note (Signed)
Stable follows with pulm

## 2022-10-05 NOTE — Assessment & Plan Note (Signed)
Now manages with benazepril 20 mg and amlodipine 5 mg  Lower dose of amlo has helped BP Controlled and stable F/b cardiology

## 2022-10-11 ENCOUNTER — Other Ambulatory Visit (INDEPENDENT_AMBULATORY_CARE_PROVIDER_SITE_OTHER): Payer: Self-pay | Admitting: Nurse Practitioner

## 2022-10-11 DIAGNOSIS — I739 Peripheral vascular disease, unspecified: Secondary | ICD-10-CM

## 2022-10-12 ENCOUNTER — Encounter (INDEPENDENT_AMBULATORY_CARE_PROVIDER_SITE_OTHER): Payer: Self-pay | Admitting: Nurse Practitioner

## 2022-10-12 ENCOUNTER — Ambulatory Visit (INDEPENDENT_AMBULATORY_CARE_PROVIDER_SITE_OTHER): Payer: Medicare HMO

## 2022-10-12 ENCOUNTER — Ambulatory Visit (INDEPENDENT_AMBULATORY_CARE_PROVIDER_SITE_OTHER): Payer: Medicare HMO | Admitting: Nurse Practitioner

## 2022-10-12 VITALS — BP 143/64 | HR 52 | Resp 18 | Ht 67.0 in | Wt 181.0 lb

## 2022-10-12 DIAGNOSIS — I739 Peripheral vascular disease, unspecified: Secondary | ICD-10-CM

## 2022-10-12 DIAGNOSIS — I10 Essential (primary) hypertension: Secondary | ICD-10-CM | POA: Diagnosis not present

## 2022-10-12 DIAGNOSIS — E7800 Pure hypercholesterolemia, unspecified: Secondary | ICD-10-CM

## 2022-10-13 LAB — VAS US ABI WITH/WO TBI
Left ABI: 0.85
Right ABI: 0.91

## 2022-10-13 NOTE — Progress Notes (Signed)
Subjective:    Patient ID: Penny Reed, female    DOB: [birth year], 77 y.o.   MRN: [id number] Chief Complaint  Patient presents with   Follow-up    6 month ABI + bilateral art dup.    Currently Penny Reed is a 77 year old female who returns today for noninvasive studies regarding peripheral arterial disease.  She has claudication of her bilateral lower extremities.  She does not note that 1 leg is worse than the other.  Since her last visit she has been increasing her walking activity and exercise and has noticed some improvement but not complete resolution.  She denies progression to rest pain or ulceration.  Today her noninvasive study showed an ABI of 0.91 on the right and 0.85 on the left.  This is some slight improvement from the previous studies 6 months ago which showed an ABI 0.90 on the right and 0.77 on the left.  Additional arterial duplex shows primarily biphasic waveforms throughout the right lower extremity with some dampened flow in the distal anterior tibial artery.  The left lower extremity shows monophasic waveforms through the proximal SFA with noted stenosis in the SFA as well as popliteal artery.    Review of Systems  Cardiovascular:        Claudication  Skin:  Positive for wound.  All other systems reviewed and are negative.      Objective:   Physical Exam Vitals reviewed.  HENT:     Head: Normocephalic.  Cardiovascular:     Rate and Rhythm: Normal rate.     Pulses:          Dorsalis pedis pulses are detected w/ Doppler on the right side and detected w/ Doppler on the left side.       Posterior tibial pulses are detected w/ Doppler on the right side and detected w/ Doppler on the left side.  Pulmonary:     Effort: Pulmonary effort is normal.  Skin:    General: Skin is warm and dry.  Neurological:     Mental Status: She is alert and oriented to person, place, and time.  Psychiatric:        Mood and Affect: Mood normal.        Behavior: Behavior normal.         Thought Content: Thought content normal.        Judgment: Judgment normal.     BP (!) 143/64 (BP Location: Left Arm)   Pulse (!) 52   Resp 18   Ht 5\' 7"  (1.702 m)   Wt 181 lb (82.1 kg)   BMI 28.35 kg/m   Past Medical History:  Diagnosis Date   Allergic rhinitis    CAD (coronary artery disease)    Complication of anesthesia    woke up during colonoscopy   Hyperlipidemia    Hypertension    Hypothyroidism    LVH (left ventricular hypertrophy)     Social History   Socioeconomic History   Marital status: Married    Spouse name: Not on file   Number of children: 3   Years of education: Not on file   Highest education level: Associate degree: academic program  Occupational History   Not on file  Tobacco Use   Smoking status: Former    Packs/day: 0.25    Years: 25.00    Additional pack years: 0.00    Total pack years: 6.25    Types: Cigarettes    Quit date: 04/25/2005    Years  since quitting: 17.4   Smokeless tobacco: Never   Tobacco comments:    Former smoker 06/28/21  Vaping Use   Vaping Use: Never used  Substance and Sexual Activity   Alcohol use: Yes    Alcohol/week: 6.0 - 14.0 standard drinks of alcohol    Types: 6 - 14 Glasses of wine per week    Comment: 2 glasses in a setting over 3-7 days a week 06/28/21   Drug use: No   Sexual activity: Not on file  Other Topics Concern   Not on file  Social History Narrative   Not on file   Social Determinants of Health   Financial Resource Strain: Low Risk  (10/04/2022)   Overall Financial Resource Strain (CARDIA)    Difficulty of Paying Living Expenses: Not hard at all  Food Insecurity: No Food Insecurity (10/04/2022)   Hunger Vital Sign    Worried About Running Out of Food in the Last Year: Never true    Ran Out of Food in the Last Year: Never true  Transportation Needs: No Transportation Needs (10/04/2022)   PRAPARE - Administrator, Civil Service (Medical): No    Lack of Transportation  (Non-Medical): No  Physical Activity: Sufficiently Active (10/04/2022)   Exercise Vital Sign    Days of Exercise per Week: 6 days    Minutes of Exercise per Session: 60 min  Stress: No Stress Concern Present (10/04/2022)   [employer] of Occupational Health - Occupational Stress Questionnaire    Feeling of Stress : Not at all  Social Connections: Moderately Integrated (10/04/2022)   Social Connection and Isolation Panel [NHANES]    Frequency of Communication with Friends and Family: More than three times a week    Frequency of Social Gatherings with Friends and Family: Three times a week    Attends Religious Services: Never    Active Member of Clubs or Organizations: Yes    Attends Banker Meetings: More than 4 times per year    Marital Status: Married  Catering manager Violence: Not At Risk (09/27/2022)   Humiliation, Afraid, Rape, and Kick questionnaire    Fear of Current or Ex-Partner: No    Emotionally Abused: No    Physically Abused: No    Sexually Abused: No    Past Surgical History:  Procedure Laterality Date   ABDOMINAL HYSTERECTOMY     has left ovary   APPENDECTOMY     ATRIAL FIBRILLATION ABLATION N/A 05/31/2021   Procedure: ATRIAL FIBRILLATION ABLATION;  Surgeon: Lanier Prude, MD;  Location: [facility] INVASIVE CV LAB;  Service: Cardiovascular;  Laterality: N/A;   CARDIAC CATHETERIZATION  09/2005   had 1 stent and 3 balloons placed   CARDIOVERSION N/A 04/02/2021   Procedure: CARDIOVERSION;  Surgeon: Meriam Sprague, MD;  Location: [facility] ENDOSCOPY;  Service: Cardiovascular;  Laterality: N/A;   CARDIOVERSION N/A 07/28/2021   Procedure: CARDIOVERSION;  Surgeon: Maisie Fus, MD;  Location: [facility] ENDOSCOPY;  Service: Cardiovascular;  Laterality: N/A;   COLONOSCOPY WITH PROPOFOL N/A 12/04/2019   Procedure: COLONOSCOPY WITH PROPOFOL;  Surgeon: Earline Mayotte, MD;  Location: [facility] ENDOSCOPY;  Service: Endoscopy;  Laterality: N/A;   CORONARY ARTERY BYPASS GRAFT      SVT ABLATION N/A 09/04/2020   Procedure: SVT ABLATION;  Surgeon: Lanier Prude, MD;  Location: [facility] INVASIVE CV LAB;  Service: Cardiovascular;  Laterality: N/A;   TONSILLECTOMY      Family History  Problem Relation Age of Onset   Diabetes  Mother    Stroke Mother        cause of death   Coronary artery disease Father    Heart attack Father    Hyperlipidemia Sister    Hypertension Sister    Breast cancer Sister 48   Cancer Sister        breast cancer   Asthma Sister    Scleroderma Sister    Thyroid disease Sister    Heart attack Brother    Cancer - Colon Paternal Grandmother    Breast cancer Maternal Aunt        47s    Allergies  Allergen Reactions   Adhesive [Tape]     Band-aids irritate skin   Azithromycin Diarrhea and Nausea And Vomiting   Eliquis [Apixaban] Swelling    Leg and ankle swelling       Latest Ref Rng & Units 07/28/2021    8:43 AM 05/11/2021    9:59 AM 03/23/2021    9:00 AM  CBC  WBC 4.0 - 10.5 K/uL 5.1  5.8  4.5   Hemoglobin 12.0 - 15.0 g/dL 16.1  09.6  04.5   Hematocrit 36.0 - 46.0 % 39.5  40.3  39.7   Platelets 150 - 400 K/uL 159  216  203       CMP     Component Value Date/Time   NA 145 (H) 04/06/2022 0954   K 5.0 04/06/2022 0954   CL 106 04/06/2022 0954   CO2 22 04/06/2022 0954   GLUCOSE 106 (H) 04/06/2022 0954   GLUCOSE 126 (H) 07/28/2021 0843   BUN 15 04/06/2022 0954   CREATININE 0.86 04/06/2022 0954   CALCIUM 10.2 04/06/2022 0954   PROT 7.0 04/06/2022 0954   ALBUMIN 4.5 04/06/2022 0954   AST 39 04/06/2022 0954   ALT 26 04/06/2022 0954   ALKPHOS 85 04/06/2022 0954   BILITOT 0.5 04/06/2022 0954   EGFR 71 04/06/2022 0954   GFRNONAA 56 (L) 07/28/2021 0843     No results found.     Assessment & Plan:   1. PAD (peripheral artery disease) (HCC) Long discussion regarding the patient's studies and her options today.  She still has claudication symptoms but does not yet have any worsening rest pain or ulcerations.  At this time  the patient wishes to continue with activity to help improve perfusion this is certainly reasonable however if she feels that now the claudication has progressed to lifestyle limiting point, additional interventions would be warranted.  We discussed the use of Pletal can sometimes noted improvement for claudication symptoms for patients.  We also discussed more invasive options being lower extremity angiogram.  At this time the patient wishes to consider options.  Will plan on having the patient return in 6 months however if she decides she wants to move forward with angiogram or other options discussed, we can have her placed on the schedule for intervention.  Discussed that if the patient decides to move forward with intervention her left lower extremity is warm and the intervention to determine if there is a drastic improvement in symptoms since this is notably worse with ABIs and on duplex.  2. Pure hypercholesterolemia Continue statin as ordered and reviewed, no changes at this time  3. Primary hypertension Continue antihypertensive medications as already ordered, these medications have been reviewed and there are no changes at this time.   Current Outpatient Medications on File Prior to Visit  Medication Sig Dispense Refill   albuterol (PROAIR HFA) 108 (90  BASE) MCG/ACT inhaler Inhale 2 puffs into the lungs every 6 (six) hours as needed for wheezing or shortness of breath. 3 Inhaler 3   amLODipine (NORVASC) 5 MG tablet Take 1 tablet (5 mg total) by mouth daily. 90 tablet 3   atorvastatin (LIPITOR) 80 MG tablet Take 1 tablet (80 mg total) by mouth daily. 90 tablet 3   Azelastine HCl 137 MCG/SPRAY SOLN SPRAY 2 SPRAYS INTO EACH NOSTRIL EVERY DAY 30 mL 3   benazepril (LOTENSIN) 10 MG tablet Take 1 tablet (10 mg total) by mouth daily. (Patient taking differently: Take 20 mg by mouth daily.) 30 tablet 3   Calcium Carbonate (CALCIUM 600 PO) Take 600 mg by mouth in the morning and at bedtime.      cetirizine (ZYRTEC) 10 MG tablet Take 10 mg by mouth daily as needed for allergies.     cholecalciferol (VITAMIN D3) 25 MCG (1000 UNIT) tablet Take 1,000 Units by mouth daily.     empagliflozin (JARDIANCE) 10 MG TABS tablet Take 1 tablet by mouth daily.     furosemide (LASIX) 40 MG tablet Take 1 tablet (40 mg total) by mouth daily as needed (swelling/weight gain). 90 tablet 3   levothyroxine (SYNTHROID) 112 MCG tablet TAKE 1 TABLET BY MOUTH SIX DAYS PER WEEK. TAKE 1 AND 1/2 TABLET BY MOUTH ONE DAY PER WEEK (WEDNESDAYS) 100 tablet 2   Multiple Vitamin (MULTIVITAMIN) tablet Take 1 tablet by mouth daily. Standard Process     nitroGLYCERIN (NITROSTAT) 0.4 MG SL tablet Place 0.4 mg under the tongue every 5 (five) minutes as needed for chest pain.      umeclidinium-vilanterol (ANORO ELLIPTA) 62.5-25 MCG/ACT AEPB INHALE 1 PUFF BY MOUTH EVERY DAY 60 each 7   zolpidem (AMBIEN) 5 MG tablet Take 1 tablet (5 mg total) by mouth at bedtime as needed. for sleep 30 tablet 3   rivaroxaban (XARELTO) 20 MG TABS tablet TAKE 1 TABLET BY MOUTH DAILY WITH SUPPER. STOP ELIQUIS (Patient not taking: Reported on 10/12/2022) 90 tablet 1   No current facility-administered medications on file prior to visit.    There are no Patient Instructions on file for this visit. No follow-ups on file.   Georgiana Spinner, NP

## 2022-10-24 ENCOUNTER — Encounter: Payer: Self-pay | Admitting: Family Medicine

## 2022-10-24 ENCOUNTER — Ambulatory Visit: Payer: Self-pay | Admitting: *Deleted

## 2022-10-24 ENCOUNTER — Ambulatory Visit (INDEPENDENT_AMBULATORY_CARE_PROVIDER_SITE_OTHER): Payer: Medicare HMO | Admitting: Family Medicine

## 2022-10-24 VITALS — BP 133/52 | HR 60 | Ht 67.0 in | Wt 182.0 lb

## 2022-10-24 DIAGNOSIS — N939 Abnormal uterine and vaginal bleeding, unspecified: Secondary | ICD-10-CM

## 2022-10-24 DIAGNOSIS — H903 Sensorineural hearing loss, bilateral: Secondary | ICD-10-CM | POA: Diagnosis not present

## 2022-10-24 DIAGNOSIS — H6123 Impacted cerumen, bilateral: Secondary | ICD-10-CM | POA: Diagnosis not present

## 2022-10-24 DIAGNOSIS — B3731 Acute candidiasis of vulva and vagina: Secondary | ICD-10-CM

## 2022-10-24 NOTE — Telephone Encounter (Signed)
Reason for Disposition  Taking Coumadin (warfarin) or other strong blood thinner, or known bleeding disorder (e.g., thrombocytopenia)  Answer Assessment - Initial Assessment Questions 1. AMOUNT: "Describe the bleeding that you are having." "How much bleeding is there?"    - SPOTTING: spotting, or pinkish / brownish mucous discharge; does not fill panty liner or pad    - MILD:  less than 1 pad / hour; less than patient's usual menstrual bleeding   - MODERATE: 1-2 pads / hour; 1 menstrual cup every 6 hours; small-medium blood clots (e.g., pea, grape, small coin)   - SEVERE: soaking 2 or more pads/hour for 2 or more hours; 1 menstrual cup every 2 hours; bleeding not contained by pads or continuous red blood from vagina; large blood clots (e.g., golf ball, large coin)      Mild/moderate- overnight- heaviest when gets up 2. ONSET: "When did the bleeding begin?" "Is it continuing now?"     Treating yeast with monistat- 5 days- yesterday am and today 3. MENOPAUSE: "When was your last menstrual period?"      Age 72's hysterectomy 4. ABDOMEN PAIN: "Do you have any pain?" "How bad is the pain?"  (e.g., Scale 1-10; mild, moderate, or severe)   - MILD (1-3): doesn't interfere with normal activities, abdomen soft and not tender to touch    - MODERATE (4-7): interferes with normal activities or awakens from sleep, abdomen tender to touch    - SEVERE (8-10): excruciating pain, doubled over, unable to do any normal activities      no 5. BLOOD THINNERS: "Do you take any blood thinners?" (e.g., Coumadin/warfarin, Pradaxa/dabigatran, aspirin)     yes 6. HORMONE MEDICINES: "Are you taking any hormone medicines, prescription or OTC?" (e.g., birth control pills, estrogen)     no 7. CAUSE: "What do you think is causing the bleeding?" (e.g., recent gyn surgery, recent gyn procedure; known bleeding disorder, uterine cancer)       Not sure 8. HEMODYNAMIC STATUS: "Are you weak or feeling lightheaded?" If Yes, ask:  "Can you stand and walk normally?"       no 9. OTHER SYMPTOMS: "What other symptoms are you having with the bleeding?" (e.g., back pain, burning with urination, fever)     Yeast treatment only  Protocols used: Vaginal Bleeding - Postmenopausal-A-AH

## 2022-10-24 NOTE — Telephone Encounter (Signed)
  Chief Complaint: post menopausal bleeding Symptoms: recent treatment OTC yeast- bleeding for 2 days Frequency: 2 days Pertinent Negatives: Patient denies back pain, burning with urination, fever Disposition: [] ED /[] Urgent Care (no appt availability in office) / [x] Appointment(In office/virtual)/ []  [facility]/ [] Home Care/ [] Refused Recommended Disposition /[] [facility]/ []  Follow-up with PCP Additional Notes: Patient has been scheduled for appointment- she does take a blood thinner.

## 2022-10-26 DIAGNOSIS — H2513 Age-related nuclear cataract, bilateral: Secondary | ICD-10-CM | POA: Diagnosis not present

## 2022-10-26 DIAGNOSIS — H524 Presbyopia: Secondary | ICD-10-CM | POA: Diagnosis not present

## 2022-10-26 DIAGNOSIS — H52223 Regular astigmatism, bilateral: Secondary | ICD-10-CM | POA: Diagnosis not present

## 2022-10-26 DIAGNOSIS — H5203 Hypermetropia, bilateral: Secondary | ICD-10-CM | POA: Diagnosis not present

## 2022-10-26 DIAGNOSIS — H25013 Cortical age-related cataract, bilateral: Secondary | ICD-10-CM | POA: Diagnosis not present

## 2022-10-26 DIAGNOSIS — H43812 Vitreous degeneration, left eye: Secondary | ICD-10-CM | POA: Diagnosis not present

## 2022-10-26 DIAGNOSIS — Z135 Encounter for screening for eye and ear disorders: Secondary | ICD-10-CM | POA: Diagnosis not present

## 2022-11-04 NOTE — Progress Notes (Signed)
Established patient visit   Patient: Penny Reed   DOB: 11-Jan-1946   77 y.o. Female  MRN: [id number] Visit Date: 10/24/2022  Today's healthcare provider: Mila Merry, MD   Chief Complaint  Patient presents with   Vaginal Bleeding    Heavy bleeding for 2 days Have yeast infection, maybe the mono stat or jardiance   Subjective    Discussed the use of AI scribe software for clinical note transcription with the patient, who gave verbal consent to proceed.  History of Present Illness   The patient, with a history of hysterectomy at age 60 presents with recent episode of vaginal bleeding. The bleeding was noted to occur overnight, but not during the day, and started over the past weekend. The patient described the bleeding as more than just spotting, but denied any associated pain. She also reports a concurrent yeast infection, for which she was self-treating with over-the-counter Monistat for the past five days. The yeast infection was reported to be improving but not yet resolved. The patient denied any history of similar bleeding with past yeast infections.  The patient also mentioned that she has one remaining ovary post-hysterectomy. The patient did not report any new medications apart from the recently started Jardiance and over-the-counter Monistat.       Medications: Outpatient Medications Prior to Visit  Medication Sig   albuterol (PROAIR HFA) 108 (90 BASE) MCG/ACT inhaler Inhale 2 puffs into the lungs every 6 (six) hours as needed for wheezing or shortness of breath.   amLODipine (NORVASC) 5 MG tablet Take 1 tablet (5 mg total) by mouth daily.   atorvastatin (LIPITOR) 80 MG tablet Take 1 tablet (80 mg total) by mouth daily.   Azelastine HCl 137 MCG/SPRAY SOLN SPRAY 2 SPRAYS INTO EACH NOSTRIL EVERY DAY   benazepril (LOTENSIN) 10 MG tablet Take 1 tablet (10 mg total) by mouth daily. (Patient taking differently: Take 20 mg by mouth daily.)   Calcium Carbonate (CALCIUM  600 PO) Take 600 mg by mouth in the morning and at bedtime.   cholecalciferol (VITAMIN D3) 25 MCG (1000 UNIT) tablet Take 1,000 Units by mouth daily.   empagliflozin (JARDIANCE) 10 MG TABS tablet Take 1 tablet by mouth daily.   furosemide (LASIX) 40 MG tablet Take 1 tablet (40 mg total) by mouth daily as needed (swelling/weight gain).   levothyroxine (SYNTHROID) 112 MCG tablet TAKE 1 TABLET BY MOUTH SIX DAYS PER WEEK. TAKE 1 AND 1/2 TABLET BY MOUTH ONE DAY PER WEEK [facility])   Multiple Vitamin (MULTIVITAMIN) tablet Take 1 tablet by mouth daily. Standard Process   nitroGLYCERIN (NITROSTAT) 0.4 MG SL tablet Place 0.4 mg under the tongue every 5 (five) minutes as needed for chest pain.    rivaroxaban (XARELTO) 20 MG TABS tablet TAKE 1 TABLET BY MOUTH DAILY WITH SUPPER. STOP ELIQUIS   umeclidinium-vilanterol (ANORO ELLIPTA) 62.5-25 MCG/ACT AEPB INHALE 1 PUFF BY MOUTH EVERY DAY   zolpidem (AMBIEN) 5 MG tablet Take 1 tablet (5 mg total) by mouth at bedtime as needed. for sleep   cetirizine (ZYRTEC) 10 MG tablet Take 10 mg by mouth daily as needed for allergies.   No facility-administered medications prior to visit.   Review of Systems       Objective    BP (!) 133/52 (BP Location: Right Arm, Patient Position: Sitting, Cuff Size: Normal)   Pulse 60   Ht 5\' 7"  (1.702 m)   Wt 182 lb (82.6 kg)   BMI 28.51 kg/m  Physical Exam   GENITOURINARY:  Cervix not observed. No active bleeding observed, vaginal and vulvar inflammation present with scant white discharge noted, likely related to yeast infection. No suspicious lesions or masses seen.    No results found for any visits on 10/24/22.  Assessment & Plan     Assessment and Plan    Postmenopausal Vaginal Bleeding: New onset episode overnight bleeding, likely secondary to inflammation from a yeast infection. No pain reported. No concerning lesions or tumors noted on examination. -Continue over-the-counter Monistat for yeast infection until  symptoms resolve. -Expect bleeding to stop completely once yeast infection clears. -If bleeding persists, refer to Gynecology for further evaluation.  Yeast Infection: Improvement noted with over-the-counter Monistat treatment, but not yet resolved. -Continue Monistat treatment until infection is completely cleared.         Mila Merry, MD  [facility] [phone] (phone) [phone] (fax)  [facility]

## 2022-11-07 ENCOUNTER — Ambulatory Visit
Admission: RE | Admit: 2022-11-07 | Discharge: 2022-11-07 | Disposition: A | Discharge status: Home / Self Care | Payer: Medicare HMO | Source: Ambulatory Visit | Attending: Physician Assistant | Admitting: Physician Assistant

## 2022-11-07 DIAGNOSIS — Z1231 Encounter for screening mammogram for malignant neoplasm of breast: Secondary | ICD-10-CM | POA: Diagnosis not present

## 2022-11-08 NOTE — Progress Notes (Signed)
Hi Jaysa  Normal mammogram; repeat in 1 year.  Please let us know if you have any questions.  Thank you,  Merita Norton, FNP

## 2022-11-12 ENCOUNTER — Other Ambulatory Visit: Payer: Self-pay | Admitting: Cardiology

## 2022-11-14 DIAGNOSIS — H2513 Age-related nuclear cataract, bilateral: Secondary | ICD-10-CM | POA: Diagnosis not present

## 2022-11-14 DIAGNOSIS — I10 Essential (primary) hypertension: Secondary | ICD-10-CM | POA: Diagnosis not present

## 2022-11-14 DIAGNOSIS — H25013 Cortical age-related cataract, bilateral: Secondary | ICD-10-CM | POA: Diagnosis not present

## 2022-11-14 DIAGNOSIS — H25043 Posterior subcapsular polar age-related cataract, bilateral: Secondary | ICD-10-CM | POA: Diagnosis not present

## 2022-11-14 DIAGNOSIS — H2512 Age-related nuclear cataract, left eye: Secondary | ICD-10-CM | POA: Diagnosis not present

## 2022-11-21 ENCOUNTER — Encounter: Payer: Self-pay | Admitting: Pharmacist

## 2022-11-21 DIAGNOSIS — H2513 Age-related nuclear cataract, bilateral: Secondary | ICD-10-CM | POA: Diagnosis not present

## 2022-11-21 NOTE — Progress Notes (Signed)
Patient previously followed by [facility] pharmacist. Per clinical review, no pharmacist appointment needed at this time. Will notify pharmacy patient advocate team of medication assistance for renewal for 2025.

## 2022-11-23 DIAGNOSIS — H6983 Other specified disorders of Eustachian tube, bilateral: Secondary | ICD-10-CM | POA: Diagnosis not present

## 2022-11-25 ENCOUNTER — Telehealth: Payer: Self-pay

## 2022-11-25 NOTE — Telephone Encounter (Signed)
Copied from CRM [id number]. Topic: Appointment Scheduling - Scheduling Inquiry for Clinic >> Nov 24, 2022  9:33 AM Patsy Lager T wrote: Reason for CRM: please contact patient to reschedule her appt she had with [name] on 8/6 at 11.

## 2022-11-29 DIAGNOSIS — I5032 Chronic diastolic (congestive) heart failure: Secondary | ICD-10-CM | POA: Diagnosis not present

## 2022-11-29 DIAGNOSIS — I209 Angina pectoris, unspecified: Secondary | ICD-10-CM | POA: Diagnosis not present

## 2022-12-05 DIAGNOSIS — H2512 Age-related nuclear cataract, left eye: Secondary | ICD-10-CM | POA: Diagnosis not present

## 2022-12-05 DIAGNOSIS — H2511 Age-related nuclear cataract, right eye: Secondary | ICD-10-CM | POA: Diagnosis not present

## 2022-12-20 ENCOUNTER — Other Ambulatory Visit: Payer: Self-pay | Admitting: Physician Assistant

## 2022-12-20 ENCOUNTER — Other Ambulatory Visit (HOSPITAL_COMMUNITY): Payer: Self-pay | Admitting: Cardiology

## 2022-12-20 DIAGNOSIS — I4819 Other persistent atrial fibrillation: Secondary | ICD-10-CM

## 2022-12-20 DIAGNOSIS — L731 Pseudofolliculitis barbae: Secondary | ICD-10-CM

## 2022-12-20 DIAGNOSIS — E7800 Pure hypercholesterolemia, unspecified: Secondary | ICD-10-CM

## 2022-12-20 DIAGNOSIS — I10 Essential (primary) hypertension: Secondary | ICD-10-CM

## 2022-12-20 DIAGNOSIS — J309 Allergic rhinitis, unspecified: Secondary | ICD-10-CM

## 2022-12-20 NOTE — Telephone Encounter (Signed)
Xarelto refill requested received. Pt is now seen at [facility].

## 2022-12-21 ENCOUNTER — Encounter: Payer: Self-pay | Admitting: Ophthalmology

## 2022-12-22 ENCOUNTER — Encounter: Payer: Self-pay | Admitting: Anesthesiology

## 2023-01-03 ENCOUNTER — Ambulatory Visit: Admission: RE | Admit: 2023-01-03 | Payer: Medicare HMO | Source: Home / Self Care | Admitting: Ophthalmology

## 2023-01-03 HISTORY — DX: Sleep apnea, unspecified: G47.30

## 2023-01-03 HISTORY — DX: Peripheral vascular disease, unspecified: I73.9

## 2023-01-03 HISTORY — DX: Chronic obstructive pulmonary disease, unspecified: J44.9

## 2023-01-03 SURGERY — PHACOEMULSIFICATION, CATARACT, WITH IOL INSERTION
Anesthesia: Topical | Laterality: Right

## 2023-01-04 DIAGNOSIS — I2510 Atherosclerotic heart disease of native coronary artery without angina pectoris: Secondary | ICD-10-CM | POA: Diagnosis not present

## 2023-01-04 DIAGNOSIS — Z0000 Encounter for general adult medical examination without abnormal findings: Secondary | ICD-10-CM | POA: Diagnosis not present

## 2023-01-04 DIAGNOSIS — H6123 Impacted cerumen, bilateral: Secondary | ICD-10-CM | POA: Diagnosis not present

## 2023-01-04 DIAGNOSIS — Z87891 Personal history of nicotine dependence: Secondary | ICD-10-CM | POA: Diagnosis not present

## 2023-01-04 DIAGNOSIS — I5042 Chronic combined systolic (congestive) and diastolic (congestive) heart failure: Secondary | ICD-10-CM | POA: Diagnosis not present

## 2023-01-04 DIAGNOSIS — J449 Chronic obstructive pulmonary disease, unspecified: Secondary | ICD-10-CM | POA: Diagnosis not present

## 2023-01-04 DIAGNOSIS — I739 Peripheral vascular disease, unspecified: Secondary | ICD-10-CM | POA: Diagnosis not present

## 2023-01-04 DIAGNOSIS — K219 Gastro-esophageal reflux disease without esophagitis: Secondary | ICD-10-CM | POA: Diagnosis not present

## 2023-01-04 DIAGNOSIS — H6983 Other specified disorders of Eustachian tube, bilateral: Secondary | ICD-10-CM | POA: Diagnosis not present

## 2023-01-04 DIAGNOSIS — I482 Chronic atrial fibrillation, unspecified: Secondary | ICD-10-CM | POA: Diagnosis not present

## 2023-01-04 DIAGNOSIS — I110 Hypertensive heart disease with heart failure: Secondary | ICD-10-CM | POA: Diagnosis not present

## 2023-01-17 ENCOUNTER — Ambulatory Visit: Admit: 2023-01-17 | Payer: Medicare HMO | Admitting: Ophthalmology

## 2023-01-17 SURGERY — PHACOEMULSIFICATION, CATARACT, WITH IOL INSERTION
Anesthesia: Topical | Laterality: Left

## 2023-01-18 DIAGNOSIS — H25813 Combined forms of age-related cataract, bilateral: Secondary | ICD-10-CM | POA: Diagnosis not present

## 2023-02-11 ENCOUNTER — Other Ambulatory Visit (HOSPITAL_COMMUNITY): Payer: Self-pay | Admitting: Cardiology

## 2023-02-11 DIAGNOSIS — I4819 Other persistent atrial fibrillation: Secondary | ICD-10-CM

## 2023-02-13 NOTE — Telephone Encounter (Signed)
Prescription refill request for Xarelto received.  Indication:afib Last office visit:8/24 Weight:81.6  kg Age:77 Scr:0.9  9/24 CrCl:69.57  ml/min  Prescription refilled

## 2023-03-13 ENCOUNTER — Encounter: Payer: Self-pay | Admitting: Certified Registered"

## 2023-03-13 ENCOUNTER — Encounter: Payer: Self-pay | Admitting: Emergency Medicine

## 2023-03-13 ENCOUNTER — Ambulatory Visit: Admit: 2023-03-13 | Payer: Medicare HMO | Admitting: Internal Medicine

## 2023-03-13 ENCOUNTER — Other Ambulatory Visit: Payer: Self-pay

## 2023-03-13 ENCOUNTER — Encounter
Admission: EM | Disposition: A | Discharge status: Home / Self Care | Payer: Self-pay | Source: Home / Self Care | Attending: Emergency Medicine

## 2023-03-13 ENCOUNTER — Emergency Department
Admission: EM | Admit: 2023-03-13 | Discharge: 2023-03-13 | Disposition: A | Discharge status: Home / Self Care | Payer: Medicare HMO | Attending: Emergency Medicine | Admitting: Emergency Medicine

## 2023-03-13 DIAGNOSIS — R000 Tachycardia, unspecified: Secondary | ICD-10-CM | POA: Diagnosis not present

## 2023-03-13 DIAGNOSIS — I2510 Atherosclerotic heart disease of native coronary artery without angina pectoris: Secondary | ICD-10-CM | POA: Diagnosis not present

## 2023-03-13 DIAGNOSIS — I509 Heart failure, unspecified: Secondary | ICD-10-CM | POA: Insufficient documentation

## 2023-03-13 DIAGNOSIS — I5032 Chronic diastolic (congestive) heart failure: Secondary | ICD-10-CM | POA: Diagnosis not present

## 2023-03-13 DIAGNOSIS — I110 Hypertensive heart disease with heart failure: Secondary | ICD-10-CM | POA: Diagnosis not present

## 2023-03-13 DIAGNOSIS — Z7901 Long term (current) use of anticoagulants: Secondary | ICD-10-CM | POA: Insufficient documentation

## 2023-03-13 DIAGNOSIS — J449 Chronic obstructive pulmonary disease, unspecified: Secondary | ICD-10-CM | POA: Diagnosis not present

## 2023-03-13 DIAGNOSIS — I4891 Unspecified atrial fibrillation: Secondary | ICD-10-CM | POA: Diagnosis not present

## 2023-03-13 DIAGNOSIS — I739 Peripheral vascular disease, unspecified: Secondary | ICD-10-CM | POA: Diagnosis not present

## 2023-03-13 DIAGNOSIS — I4819 Other persistent atrial fibrillation: Secondary | ICD-10-CM | POA: Diagnosis not present

## 2023-03-13 DIAGNOSIS — I209 Angina pectoris, unspecified: Secondary | ICD-10-CM | POA: Diagnosis not present

## 2023-03-13 DIAGNOSIS — R001 Bradycardia, unspecified: Secondary | ICD-10-CM | POA: Diagnosis not present

## 2023-03-13 DIAGNOSIS — Z951 Presence of aortocoronary bypass graft: Secondary | ICD-10-CM | POA: Diagnosis not present

## 2023-03-13 DIAGNOSIS — K219 Gastro-esophageal reflux disease without esophagitis: Secondary | ICD-10-CM | POA: Diagnosis not present

## 2023-03-13 DIAGNOSIS — R0602 Shortness of breath: Secondary | ICD-10-CM | POA: Diagnosis not present

## 2023-03-13 DIAGNOSIS — I10 Essential (primary) hypertension: Secondary | ICD-10-CM | POA: Diagnosis not present

## 2023-03-13 LAB — CBC
HCT: 44.2 % (ref 36.0–46.0)
Hemoglobin: 14.2 g/dL (ref 12.0–15.0)
MCH: 28 pg (ref 26.0–34.0)
MCHC: 32.1 g/dL (ref 30.0–36.0)
MCV: 87 fL (ref 80.0–100.0)
Platelets: 275 10*3/uL (ref 150–400)
RBC: 5.08 MIL/uL (ref 3.87–5.11)
RDW: 17.9 % — ABNORMAL HIGH (ref 11.5–15.5)
WBC: 6.3 10*3/uL (ref 4.0–10.5)
nRBC: 0 % (ref 0.0–0.2)

## 2023-03-13 LAB — MAGNESIUM: Magnesium: 2 mg/dL (ref 1.7–2.4)

## 2023-03-13 LAB — BASIC METABOLIC PANEL
Anion gap: 12 (ref 5–15)
BUN: 18 mg/dL (ref 8–23)
CO2: 25 mmol/L (ref 22–32)
Calcium: 10.1 mg/dL (ref 8.9–10.3)
Chloride: 103 mmol/L (ref 98–111)
Creatinine, Ser: 0.83 mg/dL (ref 0.44–1.00)
GFR, Estimated: >60 mL/min (ref >60)
Glucose, Bld: 110 mg/dL — ABNORMAL HIGH (ref 70–99)
Potassium: 4.1 mmol/L (ref 3.5–5.1)
Sodium: 140 mmol/L (ref 135–145)

## 2023-03-13 LAB — HEPATIC FUNCTION PANEL
ALT: 26 U/L (ref 0–44)
AST: 34 U/L (ref 15–41)
Albumin: 4.8 g/dL (ref 3.5–5.0)
Alkaline Phosphatase: 98 U/L (ref 38–126)
Bilirubin, Direct: 0.2 mg/dL (ref 0.0–0.2)
Indirect Bilirubin: 0.9 mg/dL (ref 0.3–0.9)
Total Bilirubin: 1.1 mg/dL (ref <1.2)
Total Protein: 8.1 g/dL (ref 6.5–8.1)

## 2023-03-13 LAB — TSH: TSH: 3.314 u[IU]/mL (ref 0.350–4.500)

## 2023-03-13 LAB — T4, FREE: Free T4: 1.13 ng/dL — ABNORMAL HIGH (ref 0.61–1.12)

## 2023-03-13 SURGERY — CARDIOVERSION
Anesthesia: General

## 2023-03-13 MED ORDER — ETOMIDATE 2 MG/ML IV SOLN
10.0000 mg | Freq: Once | INTRAVENOUS | Status: AC
  Administered 2023-03-13: 10 mg via INTRAVENOUS
  Filled 2023-03-13: qty 10

## 2023-03-13 MED ORDER — FENTANYL CITRATE PF 50 MCG/ML IJ SOSY
50.0000 ug | PREFILLED_SYRINGE | Freq: Once | INTRAMUSCULAR | Status: AC
  Administered 2023-03-13: 50 ug via INTRAVENOUS
  Filled 2023-03-13: qty 1

## 2023-03-13 MED ORDER — ADENOSINE 12 MG/4ML IV SOLN
INTRAVENOUS | Status: AC
  Administered 2023-03-13: 12 mg via INTRAVENOUS
  Filled 2023-03-13: qty 4

## 2023-03-13 MED ORDER — ADENOSINE 6 MG/2ML IV SOLN
INTRAVENOUS | Status: AC
  Filled 2023-03-13: qty 2

## 2023-03-13 MED ORDER — ADENOSINE 12 MG/4ML IV SOLN: 12.0000 mg | Freq: Once | INTRAVENOUS | Status: AC

## 2023-03-13 MED ORDER — DILTIAZEM HCL 25 MG/5ML IV SOLN: 10.0000 mg | Freq: Once | INTRAVENOUS | Status: AC

## 2023-03-13 MED ORDER — METOPROLOL TARTRATE 25 MG PO TABS: 12.5000 mg | ORAL_TABLET | Freq: Two times a day (BID) | ORAL | 0 refills | Status: DC

## 2023-03-13 MED ORDER — ADENOSINE 6 MG/2ML IV SOLN
6.0000 mg | Freq: Once | INTRAVENOUS | Status: AC
  Administered 2023-03-13: 6 mg via INTRAVENOUS

## 2023-03-13 MED ORDER — DILTIAZEM HCL 25 MG/5ML IV SOLN
INTRAVENOUS | Status: AC
  Administered 2023-03-13: 10 mg via INTRAVENOUS
  Filled 2023-03-13: qty 5

## 2023-03-13 NOTE — ED Notes (Signed)
Paper copy consent signed and to be placed in chart. CO2 monitoring utilized. Pt on pads. Ambubag at bedside. Suction set up

## 2023-03-13 NOTE — ED Notes (Signed)
CCMD contacted for cardiac monitoring.

## 2023-03-13 NOTE — Sedation Documentation (Addendum)
Pt shocked sync 100J by Dr Rosalia Hammers for cardioversion

## 2023-03-13 NOTE — ED Triage Notes (Signed)
Pt sent over from Cardiology; states she was being seen for 6 month follow up, found to be in SVT per MD, [place].  Patient does endorse palpitations, denies any chest pain, shortness of breath or other associated symptoms.    Patient A/Ox4, NAD noted at this time.

## 2023-03-13 NOTE — ED Notes (Addendum)
EDP, Ray at bedside.

## 2023-03-13 NOTE — ED Provider Notes (Signed)
[facility] Provider Note    Event Date/Time   First MD Initiated Contact with Patient 03/13/23 [phone]     (approximate)   History   Palpitations   HPI  Penny Reed is a 77 year old female with history of CAD, HTN, CHF, atrial fibrillation on Xarelto presenting to the emergency department for evaluation of tachycardia.  Patient had a routine follow-up with her cardiologist today.  When she was there, she was noted to have a narrow complex tachycardia concerning for SVT.  She initially had been planned for cardioversion later this afternoon, but it was recommended that she present to the ER to trial medication management.  Patient reports she can feel the palpitations, unsure when they started.  Denies chest pain, shortness of breath, nausea, vomiting, other complaints currently.     Physical Exam   Triage Vital Signs: ED Triage Vitals  Encounter Vitals Group     BP 03/13/23 0935 (!) 159/108     Systolic BP Percentile --      Diastolic BP Percentile --      Pulse Rate 03/13/23 0935 (!) 160     Resp 03/13/23 0935 13     Temp 03/13/23 0935 97.8 F (36.6 C)     Temp Source 03/13/23 0935 Oral     SpO2 03/13/23 0935 100 %     Weight 03/13/23 0936 180 lb (81.6 kg)     Height 03/13/23 0936 5\' 7"  (1.702 m)     Head Circumference --      Peak Flow --      Pain Score 03/13/23 0936 0     Pain Loc --      Pain Education --      Exclude from Growth Chart --     Most recent vital signs: Vitals:   03/13/23 1047 03/13/23 1112  BP: 133/67 125/68  Pulse: (!) 58 66  Resp: 16 14  Temp: 98 F (36.7 C) 98 F (36.7 C)  SpO2: 100% 99%     General: Awake, interactive  CV:  Tachycardic with what seems like regular rhythm no difficult to discern due to rate in the 160s  Resp:  Unlabored respirations, lungs clear to auscultation Abd:  Nondistended. Neuro:  Symmetric facial movement, fluid speech   ED Results / Procedures / Treatments   Labs (all labs  ordered are listed, but only abnormal results are displayed) Labs Reviewed  CBC - Abnormal; Notable for the following components:      Result Value   RDW 17.9 (*)    All other components within normal limits  BASIC METABOLIC PANEL - Abnormal; Notable for the following components:   Glucose, Bld 110 (*)    All other components within normal limits  T4, FREE - Abnormal; Notable for the following components:   Free T4 1.13 (*)    All other components within normal limits  HEPATIC FUNCTION PANEL  MAGNESIUM  TSH     EKG EKG independently reviewed interpreted by myself (ER attending) demonstrates:  Initial EKG demonstrates narrow complex tachycardia at a rate of 161, PR 61, QRS 93, QTc 444, consideration for SVT versus atrial flutter Repeat EKG at 947 demonstrates what appears to be A-fib with frequent ectopy at a rate of 99, QRS 86, QTc 339 during brief pause in tachycardia post administration of adenosine Repeat EKG at 1002 interpreted by computer as sinus tachycardia, but suspect A-fib at a rate of 137, QRS 86, QTc 437 EKG done post cardioversion  demonstrates sinus rhythm at a rate of 62, PR 161, QRS 99, QTc 372, ectopy noted, improved on continued telemetry evaluation   RADIOLOGY Imaging independently reviewed and interpreted by myself demonstrates:    PROCEDURES:  Critical Care performed: Yes, see critical care procedure note(s)  CRITICAL CARE Performed by: [name]   Total critical care time: 55 minutes  Critical care time was exclusive of separately billable procedures and treating other patients.  Critical care was necessary to treat or prevent imminent or life-threatening deterioration.  Critical care was time spent personally by me on the following activities: development of treatment plan with patient and/or surrogate as well as nursing, discussions with consultants, evaluation of patient's response to treatment, examination of patient, obtaining history from patient  or surrogate, ordering and performing treatments and interventions, ordering and review of laboratory studies, ordering and review of radiographic studies, pulse oximetry and re-evaluation of patient's condition.   .Cardioversion  Date/Time: 03/13/2023 12:04 PM  Performed by: [name], MD Authorized by: [name], MD   Consent:    Consent obtained:  Written   Consent given by:  Patient   Alternatives discussed:  Rate-control medication and no treatment Pre-procedure details:    Rhythm:  Atrial fibrillation   Electrode placement:  Anterior-lateral Patient sedated: Yes. Refer to sedation procedure documentation for details of sedation.  Attempt one:    Cardioversion mode:  Synchronous   Waveform:  Monophasic   Shock (Joules):  100   Shock outcome:  Conversion to normal sinus rhythm Post-procedure details:    Patient status:  Awake   Patient tolerance of procedure:  Tolerated well, no immediate complications .Sedation  Date/Time: 03/13/2023 12:05 PM  Performed by: [name], MD Authorized by: [name], MD   Consent:    Consent obtained:  Written   Consent given by:  Patient Universal protocol:    Immediately prior to procedure, a time out was called: yes   Indications:    Procedure performed:  Cardioversion Pre-sedation assessment:    Time since last food or drink:  Earlier today   ASA classification: class 2 - patient with mild systemic disease     Mallampati score:  II - soft palate, uvula, fauces visible   Pre-sedation assessments completed and reviewed: airway patency, cardiovascular function, hydration status, mental status, nausea/vomiting, pain level, respiratory function and temperature   Immediate pre-procedure details:    Reassessment: Patient reassessed immediately prior to procedure   Procedure details (see MAR for exact dosages):    Preoxygenation:  Nasal cannula   Sedation:  Etomidate   Analgesia:  Fentanyl   Intra-procedure monitoring:  Blood pressure  monitoring, continuous capnometry, cardiac monitor, continuous pulse oximetry, frequent vital sign checks and frequent LOC assessments   Intra-procedure events: none     Total Provider sedation time (minutes):  15 Post-procedure details:    Post-sedation assessment completed:  03/13/2023 12:07 PM   Recovery: Patient returned to pre-procedure baseline     Patient is stable for discharge or admission: yes      MEDICATIONS ORDERED IN ED: Medications  adenosine (ADENOCARD) 6 MG/2ML injection 6 mg ( Intravenous Not Given 03/13/23 1046)  adenosine (ADENOCARD) 12 MG/4ML injection 12 mg (12 mg Intravenous Given 03/13/23 0946)  diltiazem (CARDIZEM) injection 10 mg (10 mg Intravenous Given 03/13/23 0952)  fentaNYL (SUBLIMAZE) injection 50 mcg (50 mcg Intravenous Given 03/13/23 1028)  etomidate (AMIDATE) injection 10 mg (10 mg Intravenous Given 03/13/23 1029)     IMPRESSION / MDM / ASSESSMENT AND  PLAN / ED COURSE  I reviewed the triage vital signs and the nursing notes.  Differential diagnosis includes, but is not limited to, SVT, atrial flutter, A-fib, lower suspicion ischemia based on clinical history  Patient's presentation is most consistent with acute presentation with potential threat to life or bodily function.  77 year old female presenting to the emergency department for evaluation of palpitations found to have narrow complex tachycardia.  Blood pressure stable.  Normal mental status.  Charge RN had spoken with Dr. Juliann Pares who recommended trialing adenosine to see if this would break patient's arrhythmia.  Patient was administered 6 mg of adenosine with a several second in her rhythm after which she returned to a similar narrow complex tachycardia with heart rates in the 160s.  She was then administered 12 mg of adenosine with similar effect.  Following this, did trial 10 mg of IV diltiazem.  With this, patient did have reduction in her heart rate to the 130s to 140s with what was more  appreciably atrial fibrillation with RVR.  I did review the case with Dr. Juliann Pares.  Patient is anticoagulated and has been compliant with her medication.  With this, he does think it is reasonable to proceed with cardioversion.  If successful, he does feel that the patient can be discharged.  Cardioversion was performed successfully as above.  Patient did return to her presedation baseline.  Heart rates in the 60s following this.  Did confirm with cardiology that they would like to proceed with low-dose metoprolol at discharge.  Prescription sent.  Patient already has established follow-up with cardiology.  Strict return precautions provided.  Patient discharged in stable condition.    FINAL CLINICAL IMPRESSION(S) / ED DIAGNOSES   Final diagnoses:  Atrial fibrillation with rapid ventricular response (HCC)     Rx / DC Orders   ED Discharge Orders          Ordered    metoprolol tartrate (LOPRESSOR) 25 MG tablet  2 times daily        03/13/23 1211             Note:  This document was prepared using Dragon voice recognition software and may include unintentional dictation errors.   [name], MD 03/13/23 [phone]

## 2023-03-13 NOTE — ED Notes (Signed)
Given ice water as requested. Ok per Dr Rosalia Hammers

## 2023-03-13 NOTE — Discharge Instructions (Signed)
You were seen in the emergency department today for your elevated heart rate.  You were given medications and cardioverted back into a normal heart rhythm.  Please take your medications as directed.  I have sent a prescription for a new medication called metoprolol to your pharmacy at the recommendation of your cardiologist.  Please keep your scheduled follow-up with cardiology.  I have included information for sedation aftercare.  Return to the ER for new or worsening symptoms.

## 2023-03-14 ENCOUNTER — Ambulatory Visit: Admission: RE | Admit: 2023-03-14 | Payer: Medicare HMO | Source: Home / Self Care | Admitting: Internal Medicine

## 2023-03-14 SURGERY — CARDIOVERSION
Anesthesia: General

## 2023-03-15 NOTE — Progress Notes (Addendum)
Cardiology Office Note Date:  03/16/2023  Patient ID:  Penny Reed, Penny Reed 08-19-1945, MRN [id number] PCP:  Penny Clos, MD  Cardiologist:  Penny Pea, MD Electrophysiologist: Penny Prude, MD   Chief Complaint: DCCV follow-up  History of Present Illness: Penny Reed is a 77 y.o. female with PMH notable for atypical aflutter, persistent Afib, CAD s/p CABG, COPD, HTN, PAD, h/o DVT; seen today for Penny Prude, MD for acute visit after DCCV  She is s/p PVI ablation 05/2021, had AF post-procedure. S/p DCCV 07/2021.  Last saw Dr. Lalla Brothers 08/2021, had done well since DCCV maintaining SR. Very active. I saw her 07/2022 where she had rare, brief afib episodes and was happy with her level of control.  She presented to [facility] cardiology for routine follow-up appt 11/18 in a regular, narrow complex tachycardia at >150bpm. She was referred to ER for further evaluation where she was cardioverted to sinus brady. Since that time, she has been taking 12.5mg  lopressor daily and amiodarone 200mg  daily  On follow-up today, she was completely asymptomatic of episode. She reviewed her auro ring after the fact and her HR increased to the 150s in mid-October and has been consistently elevated since. She also remembers having an anxiety attack around this same time.  Her auro ring and samsung watch do not alert her to elevated Hrs.   She has been regularly exercising on elliptical and strength training with trainer 3 days a week. Has not exercised this week. She has not noticed any decreased exercise tolerance, increased SOB, chest pain, chest pressure Continues to take xarelto daily, no missed doses, no bleeding concerns.  AAD history: Amiodarone     Past Medical History:  Diagnosis Date   Allergic rhinitis    CAD (coronary artery disease)    Complication of anesthesia    woke up during colonoscopy   COPD (chronic obstructive pulmonary disease) (HCC)    Hyperlipidemia     Hypertension    Hypothyroidism    LVH (left ventricular hypertrophy)    Peripheral vascular disease (HCC)    Sleep apnea     Past Surgical History:  Procedure Laterality Date   ABDOMINAL HYSTERECTOMY     has left ovary   APPENDECTOMY     ATRIAL FIBRILLATION ABLATION N/A 05/31/2021   Procedure: ATRIAL FIBRILLATION ABLATION;  Surgeon: Penny Prude, MD;  Location: [facility] INVASIVE CV LAB;  Service: Cardiovascular;  Laterality: N/A;   CARDIAC CATHETERIZATION  09/2005   had 1 stent and 3 balloons placed   CARDIOVERSION N/A 04/02/2021   Procedure: CARDIOVERSION;  Surgeon: Meriam Sprague, MD;  Location: [facility] ENDOSCOPY;  Service: Cardiovascular;  Laterality: N/A;   CARDIOVERSION N/A 07/28/2021   Procedure: CARDIOVERSION;  Surgeon: Maisie Fus, MD;  Location: [facility] ENDOSCOPY;  Service: Cardiovascular;  Laterality: N/A;   COLONOSCOPY WITH PROPOFOL N/A 12/04/2019   Procedure: COLONOSCOPY WITH PROPOFOL;  Surgeon: Earline Mayotte, MD;  Location: [facility] ENDOSCOPY;  Service: Endoscopy;  Laterality: N/A;   CORONARY ARTERY BYPASS GRAFT     SVT ABLATION N/A 09/04/2020   Procedure: SVT ABLATION;  Surgeon: Penny Prude, MD;  Location: [facility] INVASIVE CV LAB;  Service: Cardiovascular;  Laterality: N/A;   TONSILLECTOMY      Current Outpatient Medications  Medication Instructions   albuterol (PROAIR HFA) 108 (90 BASE) MCG/ACT inhaler 2 puffs, Inhalation, Every 6 hours PRN   amiodarone (PACERONE) 100 mg, Oral, Daily   amLODipine (NORVASC) 5 mg, Oral, Daily  atorvastatin (LIPITOR) 80 mg, Oral, Daily   Azelastine HCl 137 MCG/SPRAY SOLN SPRAY 2 SPRAYS INTO EACH NOSTRIL EVERY DAY   benazepril (LOTENSIN) 20 mg, Oral, Daily   Calcium Carbonate (CALCIUM 600 PO) 600 mg, Oral, 2 times daily   cetirizine (ZYRTEC) 10 mg, Oral, Daily PRN   cholecalciferol (VITAMIN D3) 1,000 Units, Oral, Daily   empagliflozin (JARDIANCE) 10 MG TABS tablet 1 tablet, Oral, Daily   furosemide (LASIX) 40 mg, Oral, Daily PRN    levothyroxine (SYNTHROID) 112 MCG tablet TAKE 1 TABLET BY MOUTH SIX DAYS PER WEEK. TAKE 1 AND 1/2 TABLET BY MOUTH ONE DAY PER WEEK (WEDNESDAYS)   metoprolol tartrate (LOPRESSOR) 25 mg, Oral, As needed   Multiple Vitamin (MULTIVITAMIN) tablet 1 tablet, Oral, Daily, Standard Process   mupirocin ointment (BACTROBAN) 2 % APPLY TO AFFECTED AREA TWICE A DAY   nitroGLYCERIN (NITROSTAT) 0.4 mg, Sublingual, Every 5 min PRN   umeclidinium-vilanterol (ANORO ELLIPTA) 62.5-25 MCG/ACT AEPB INHALE 1 PUFF BY MOUTH EVERY DAY   XARELTO 20 MG TABS tablet TAKE 1 TABLET BY MOUTH DAILY WITH SUPPER. STOP ELIQUIS   zolpidem (AMBIEN) 5 mg, Oral, At bedtime PRN, for sleep    Social History:  The patient  reports that she quit smoking about 17 years ago. Her smoking use included cigarettes. She started smoking about 42 years ago. She has a 6.3 pack-year smoking history. She has never used smokeless tobacco. She reports current alcohol use of about 6.0 - 14.0 standard drinks of alcohol per week. She reports that she does not use drugs.   Family History:  The patient's family history includes Asthma in her sister; Breast cancer in her maternal aunt; Breast cancer (age of onset: 56) in her sister; Cancer in her sister; Cancer - Colon in her paternal grandmother; Coronary artery disease in her father; Diabetes in her mother; Heart attack in her brother and father; Hyperlipidemia in her sister; Hypertension in her sister; Scleroderma in her sister; Stroke in her mother; Thyroid disease in her sister.  ROS:  Please see the history of present illness. All other systems are reviewed and otherwise negative.   PHYSICAL EXAM:  VS:  BP (!) 110/50 (BP Location: Left Arm, Patient Position: Sitting, Cuff Size: Normal)   Pulse (!) 39   Ht 5\' 7"  (1.702 m)   Wt 180 lb 4 oz (81.8 kg)   SpO2 98%   BMI 28.23 kg/m  BMI: Body mass index is 28.23 kg/m.  Pulse with walking around clinic rose to mid 50s  GEN- The patient is well appearing,  alert and oriented x 3 today.   Lungs- Clear to ausculation bilaterally, normal work of breathing.  Heart- Regular, marked bradycardia rate and rhythm, no murmurs, rubs or gallops Extremities- Trace peripheral edema, warm, dry   EKG is ordered. Personal review of EKG from today shows:  EKG Interpretation Date/Time:  Thursday March 16 2023 10:08:29 EST Ventricular Rate:  39 PR Interval:  192 QRS Duration:  94 QT Interval:  592 QTC Calculation: 476 R Axis:   -1  Text Interpretation: Marked sinus bradycardia with occasional Premature ventricular complexes Incomplete left bundle branch block PREVIOUS ECG IS PRESENT Confirmed by Sherie Don [phone]) on 03/16/2023 10:14:06 AM   03/13/2023: 935 - regular, narrow complex tachycardia 1002 - aflutter with variable blocks 2:1 and 3:1, rate 137 1032 -Sinus rhythm w freq monomorphic PVCs, rate 62   Recent Labs: 03/13/2023: ALT 26; BUN 18; Creatinine, Ser 0.83; Hemoglobin 14.2; Magnesium 2.0; Platelets 275; Potassium  4.1; Sodium 140; TSH 3.314  04/06/2022: Chol/HDL Ratio 2.1; Cholesterol, Total 193; HDL 93; LDL Chol Calc (NIH) 87; Triglycerides 72   Estimated Creatinine Clearance: 64.4 mL/min (by C-G formula based on SCr of 0.83 mg/dL).   Wt Readings from Last 3 Encounters:  03/16/23 180 lb 4 oz (81.8 kg)  03/13/23 180 lb (81.6 kg)  10/24/22 182 lb (82.6 kg)     Additional studies reviewed include: Previous EP, cardiology notes.   Cardiac MRI, 08/30/2022 1. Mid septal perfusion defect on stress imaging, corresponding to area of enhancement on LGE imaging, consistent with prior infarct. No ischemia seen  2. Subendocardial LGE in focal region of basal to mid septal wall, consistent with prior infarct. LGE is greater than 50% transmural, suggesting nonviability in this region 3. Mild LV dilatation, mild hypertrophy, and normal global systolic function (EF 56%). Focal area of akinesis in basal to mid septal wall  4.  Normal RV size and  systolic function (EF 57%)  5. Dilated ascending aorta measuring 42mm. Dilated descending aorta measuring 35mm  6.  Dilated main pulmonary artery measuring 32mm  TTE, 07/28/2022  1. Left ventricular ejection fraction, by estimation, is 45 to 50%. The left ventricle has mildly decreased function. The left ventricle has no regional wall motion abnormalities. There is moderate concentric left ventricular hypertrophy. Left ventricular diastolic parameters are consistent with Grade III diastolic dysfunction (restrictive).   2. Right ventricular systolic function is normal. The right ventricular size is mildly enlarged. Mildly increased right ventricular wall thickness.   3. The mitral valve is normal in structure. Mild mitral valve regurgitation.   4. The aortic valve is normal in structure. Aortic valve regurgitation is mild. Mild aortic valve stenosis.   AF ablation, 05/31/2021 1. Pulmonary vein isolation (wide antral circumferential lesion set) 2. Posterior wall ablation and isolation  3. Cavotricuspid isthmus ablation for typical atrial flutter 4. Ablation of atypical, perimitral, atrial flutter (RSPV-MV line) 5. Esophageal temperature monitoring using the Circa temperature monitor 6. Three-dimensional electroanatomic mapping of atrial fibrillation 7. Intracardiac echocardiography 8. Transseptal puncture of an intact septum. 9. Comprehensive EP study with infusion of Isuprel   TTE, 05/25/2021  1. Left ventricular ejection fraction, by estimation, is 60 to 65%. The left ventricle has normal function. Unable to exclude small region of hypokinesis in the mid to basal septal wall (wall thinning noted in this region) There is mild to moderate asymmetric left ventricular hypertrophy of the basal-septal and septal segments. Left ventricular diastolic parameters are consistent with Grade II diastolic dysfunction (pseudonormalization).   2. Right ventricular systolic function is normal. The right ventricular  size is normal. Mildly increased right ventricular wall thickness.   3. Left atrial size was severely dilated.   4. The mitral valve is normal in structure. Mild mitral valve regurgitation. No evidence of mitral stenosis.   5. The aortic valve has an indeterminant number of cusps. Aortic valve regurgitation is mild. No aortic stenosis is present.   6. There is mild dilatation of the ascending aorta, measuring 42 mm.   7. The inferior vena cava is normal in size with greater than 50% respiratory variability, suggesting right atrial pressure of 3 mmHg.   SVT ablation, 09/04/2020 1. Sinus rhythm upon presentation.  2. Atypical atrial flutter inducible at EP study. At least 3 different circuits inducible during the EP study. 3. AV conduction appears nodal. 4. No early apparent complications.  5. Her clinical tachycardia is consistent with these atrial flutters. We will initiate Eliquis  5 mg by mouth twice daily for stroke prophylaxis. I will plan to see her back in clinic in 2 weeks to discuss antiarrhythmic therapy (Tikosyn versus amiodarone).   ASSESSMENT AND PLAN:  #) atypical atrial flutter #) persistent Afib S/p AFlutter ablation 05/2021 S/p recent DCCV after presenting to ER with HR above 150 Markedly bradycardic in office today, but chronotropically competent and asymptomatic Stop 12.5mg  lopressor daily OK to take 25mg  lopressor PRN for tachycardic episodes Reduce amiodarone to 100mg  daily Update echo  Will discuss patient with Dr. Lalla Brothers, anticipate recommendation for redo ablation  #) Hypercoag d/t  afib CHA2DS2-VASc Score = 6 [CHF History: 1, HTN History: 1, Diabetes History: 0, Stroke History: 0, Vascular Disease History: 1, Age Score: 2, Gender Score: 1].  Therefore, the patient's annual risk of stroke is 9.7 %.  Stroke ppx - 20mg  xarelto, appropriately dosed for CrCl >71ml/min. No bleeding concerns   #) CAD No ischemic s/s    Current medicines are reviewed at length  with the patient today.   The patient does not have concerns regarding her medicines.  The following changes were made today:   Reduce amiodarone to 100mg  daily Stop lopressor everyday Start 25mg  lopressor PRN for tachycardic episodes   Labs/ tests ordered today include:  Orders Placed This Encounter  Procedures   EKG 12-Lead   ECHOCARDIOGRAM COMPLETE     Disposition: Follow up with Dr. Lalla Brothers or EP APP in 4 weeks   Signed, Sherie Don, NP  03/16/23  12:37 PM  Electrophysiology [facility]   11/22 Addendum I spoke to Dr. Lalla Brothers, who recommended a redo fib/flutter ablation at earliest convenience.  I messaged EP scheduler to schedule procedure.

## 2023-03-16 ENCOUNTER — Encounter: Payer: Self-pay | Admitting: Cardiology

## 2023-03-16 ENCOUNTER — Ambulatory Visit: Payer: Medicare HMO | Attending: Cardiology | Admitting: Cardiology

## 2023-03-16 VITALS — BP 110/50 | HR 39 | Ht 67.0 in | Wt 180.2 lb

## 2023-03-16 DIAGNOSIS — I4819 Other persistent atrial fibrillation: Secondary | ICD-10-CM

## 2023-03-16 DIAGNOSIS — I484 Atypical atrial flutter: Secondary | ICD-10-CM | POA: Diagnosis not present

## 2023-03-16 DIAGNOSIS — D6869 Other thrombophilia: Secondary | ICD-10-CM | POA: Diagnosis not present

## 2023-03-16 MED ORDER — METOPROLOL TARTRATE 25 MG PO TABS: 25.0000 mg | ORAL_TABLET | ORAL | 3 refills | Status: DC | PRN

## 2023-03-16 MED ORDER — AMIODARONE HCL 100 MG PO TABS: 100.0000 mg | ORAL_TABLET | Freq: Every day | ORAL | 3 refills | Status: DC

## 2023-03-16 NOTE — Patient Instructions (Addendum)
Medication Instructions:  Decrease Amiodarone to 100 mg daily  Take Metoprolol Tartrate 25 mg as needed for tachycardia (fast heart rate).   *If you need a refill on your cardiac medications before your next appointment, please call your pharmacy*   Testing/Procedures:  Your physician has requested that you have an echocardiogram. Echocardiography is a painless test that uses sound waves to create images of your heart. It provides your doctor with information about the size and shape of your heart and how well your heart's chambers and valves are working.   You may receive an ultrasound enhancing agent through an IV if needed to better visualize your heart during the echo. This procedure takes approximately one hour.  There are no restrictions for this procedure.  This will take place at [address] [facility]) [address], [place] [address]  Please note: We ask at that you not bring children with you during ultrasound (echo/ vascular) testing. Due to room size and safety concerns, children are not allowed in the ultrasound rooms during exams. Our front office staff cannot provide observation of children in our lobby area while testing is being conducted. An adult accompanying a patient to their appointment will only be allowed in the ultrasound room at the discretion of the ultrasound technician under special circumstances. We apologize for any inconvenience.    Follow-Up: At [name], you and your health needs are our priority.  As part of our continuing mission to provide you with exceptional heart care, we have created designated Provider Care Teams.  These Care Teams include your primary Cardiologist (physician) and Advanced Practice Providers (APPs -  Physician Assistants and Nurse Practitioners) who all work together to provide you with the care you need, when you need it.  We recommend signing up for the patient portal called "MyChart".  Sign up information is  provided on this After Visit Summary.  MyChart is used to connect with patients for Virtual Visits (Telemedicine).  Patients are able to view lab/test results, encounter notes, upcoming appointments, etc.  Non-urgent messages can be sent to your provider as well.   To learn more about what you can do with MyChart, go to [email or web address].    Your next appointment:   1 month(s)  Provider:   Sherie Don, NP

## 2023-03-17 DIAGNOSIS — I484 Atypical atrial flutter: Secondary | ICD-10-CM

## 2023-03-17 DIAGNOSIS — I4819 Other persistent atrial fibrillation: Secondary | ICD-10-CM

## 2023-03-25 ENCOUNTER — Other Ambulatory Visit: Payer: Self-pay | Admitting: Physician Assistant

## 2023-04-04 ENCOUNTER — Telehealth: Payer: Self-pay

## 2023-04-06 ENCOUNTER — Ambulatory Visit: Payer: Medicare HMO | Admitting: Family Medicine

## 2023-04-11 ENCOUNTER — Ambulatory Visit: Payer: Medicare HMO | Attending: Cardiology

## 2023-04-11 ENCOUNTER — Other Ambulatory Visit (INDEPENDENT_AMBULATORY_CARE_PROVIDER_SITE_OTHER): Payer: Self-pay | Admitting: Nurse Practitioner

## 2023-04-11 DIAGNOSIS — I484 Atypical atrial flutter: Secondary | ICD-10-CM | POA: Diagnosis not present

## 2023-04-11 DIAGNOSIS — I4819 Other persistent atrial fibrillation: Secondary | ICD-10-CM

## 2023-04-11 DIAGNOSIS — D6869 Other thrombophilia: Secondary | ICD-10-CM

## 2023-04-11 DIAGNOSIS — I739 Peripheral vascular disease, unspecified: Secondary | ICD-10-CM

## 2023-04-11 LAB — ECHOCARDIOGRAM COMPLETE: Area-P 1/2: 2.76 cm2

## 2023-04-13 ENCOUNTER — Ambulatory Visit (INDEPENDENT_AMBULATORY_CARE_PROVIDER_SITE_OTHER): Payer: Medicare HMO

## 2023-04-13 ENCOUNTER — Encounter (INDEPENDENT_AMBULATORY_CARE_PROVIDER_SITE_OTHER): Payer: Self-pay | Admitting: Nurse Practitioner

## 2023-04-13 ENCOUNTER — Ambulatory Visit (INDEPENDENT_AMBULATORY_CARE_PROVIDER_SITE_OTHER): Payer: Medicare HMO | Admitting: Nurse Practitioner

## 2023-04-13 VITALS — BP 126/58 | HR 49 | Resp 16 | Ht 67.0 in | Wt 175.0 lb

## 2023-04-13 DIAGNOSIS — I739 Peripheral vascular disease, unspecified: Secondary | ICD-10-CM | POA: Diagnosis not present

## 2023-04-13 DIAGNOSIS — I10 Essential (primary) hypertension: Secondary | ICD-10-CM | POA: Diagnosis not present

## 2023-04-13 DIAGNOSIS — E7800 Pure hypercholesterolemia, unspecified: Secondary | ICD-10-CM

## 2023-04-13 LAB — VAS US ABI WITH/WO TBI
Left ABI: 0.64
Right ABI: 0.84

## 2023-04-17 ENCOUNTER — Other Ambulatory Visit: Payer: Self-pay

## 2023-04-17 DIAGNOSIS — I4819 Other persistent atrial fibrillation: Secondary | ICD-10-CM

## 2023-04-17 DIAGNOSIS — I484 Atypical atrial flutter: Secondary | ICD-10-CM

## 2023-04-18 DIAGNOSIS — Z87891 Personal history of nicotine dependence: Secondary | ICD-10-CM | POA: Diagnosis not present

## 2023-04-18 DIAGNOSIS — I2510 Atherosclerotic heart disease of native coronary artery without angina pectoris: Secondary | ICD-10-CM | POA: Diagnosis not present

## 2023-04-18 DIAGNOSIS — R0602 Shortness of breath: Secondary | ICD-10-CM | POA: Diagnosis not present

## 2023-04-18 DIAGNOSIS — Z951 Presence of aortocoronary bypass graft: Secondary | ICD-10-CM | POA: Diagnosis not present

## 2023-04-18 DIAGNOSIS — K219 Gastro-esophageal reflux disease without esophagitis: Secondary | ICD-10-CM | POA: Diagnosis not present

## 2023-04-18 DIAGNOSIS — I209 Angina pectoris, unspecified: Secondary | ICD-10-CM | POA: Diagnosis not present

## 2023-04-18 DIAGNOSIS — I10 Essential (primary) hypertension: Secondary | ICD-10-CM | POA: Diagnosis not present

## 2023-04-18 DIAGNOSIS — I739 Peripheral vascular disease, unspecified: Secondary | ICD-10-CM | POA: Diagnosis not present

## 2023-04-18 DIAGNOSIS — I480 Paroxysmal atrial fibrillation: Secondary | ICD-10-CM | POA: Diagnosis not present

## 2023-04-18 DIAGNOSIS — I5032 Chronic diastolic (congestive) heart failure: Secondary | ICD-10-CM | POA: Diagnosis not present

## 2023-04-18 DIAGNOSIS — J449 Chronic obstructive pulmonary disease, unspecified: Secondary | ICD-10-CM | POA: Diagnosis not present

## 2023-04-18 DIAGNOSIS — R000 Tachycardia, unspecified: Secondary | ICD-10-CM | POA: Diagnosis not present

## 2023-04-21 ENCOUNTER — Telehealth: Payer: Self-pay | Admitting: Cardiology

## 2023-04-21 NOTE — Telephone Encounter (Signed)
Pt wants to cancel scheduled Ablation. She no longer feels that it is needed. Please advise

## 2023-04-21 NOTE — Telephone Encounter (Signed)
Spoke with the patient who states that she has been feeling good ever since her cardioversion. Her heart rates have remained normal and she does not want to go through with the ablation at this time. She will call back if she has any future concerns.

## 2023-04-24 NOTE — Progress Notes (Signed)
Subjective:    Patient ID: Penny Reed, female    DOB: [birth year], 77 y.o.   MRN: [id number] Chief Complaint  Patient presents with   Follow-up    6 month follow up with ABI    Currently Penny Reed is a 77 year old female who returns today for noninvasive studies regarding peripheral arterial disease.  She has claudication of her bilateral lower extremities.  Since her last visit she has been increasing her walking activity and exercise and has noticed fairly significant improvement.  She has recently been doing a lot of walking and does well especially on more flat surfaces..  She denies progression to rest pain or ulceration.  Today she has an ABI of 0.84 on the right and 0.64 on the left.  This is not a significant change from the previous studies of 0.91 on the right and 0.85 on the left.  She has biphasic tibial waveforms on the right and monophasic in the left.      Review of Systems  Cardiovascular:        Claudication  All other systems reviewed and are negative.      Objective:   Physical Exam Vitals reviewed.  HENT:     Head: Normocephalic.  Cardiovascular:     Rate and Rhythm: Normal rate.     Pulses:          Dorsalis pedis pulses are detected w/ Doppler on the right side and detected w/ Doppler on the left side.       Posterior tibial pulses are detected w/ Doppler on the right side and detected w/ Doppler on the left side.  Pulmonary:     Effort: Pulmonary effort is normal.  Skin:    General: Skin is warm and dry.  Neurological:     Mental Status: She is alert and oriented to person, place, and time.  Psychiatric:        Mood and Affect: Mood normal.        Behavior: Behavior normal.        Thought Content: Thought content normal.        Judgment: Judgment normal.     BP (!) 126/58   Pulse (!) 49   Resp 16   Ht 5\' 7"  (1.702 m)   Wt 175 lb (79.4 kg)   BMI 27.41 kg/m   Past Medical History:  Diagnosis Date   Allergic rhinitis    CAD (coronary  artery disease)    Complication of anesthesia    woke up during colonoscopy   COPD (chronic obstructive pulmonary disease) (HCC)    Hyperlipidemia    Hypertension    Hypothyroidism    LVH (left ventricular hypertrophy)    Peripheral vascular disease (HCC)    Sleep apnea     Social History   Socioeconomic History   Marital status: Married    Spouse name: Not on file   Number of children: 3   Years of education: Not on file   Highest education level: Associate degree: academic program  Occupational History   Not on file  Tobacco Use   Smoking status: Former    Current packs/day: 0.00    Average packs/day: 0.3 packs/day for 25.0 years (6.3 ttl pk-yrs)    Types: Cigarettes    Start date: 04/25/1980    Quit date: 04/25/2005    Years since quitting: 18.0   Smokeless tobacco: Never   Tobacco comments:    Former smoker 06/28/21  Vaping Use   Vaping  status: Never Used  Substance and Sexual Activity   Alcohol use: Yes    Alcohol/week: 6.0 - 14.0 standard drinks of alcohol    Types: 6 - 14 Glasses of wine per week    Comment: 2 glasses in a setting over 3-7 days a week 06/28/21   Drug use: No   Sexual activity: Not on file  Other Topics Concern   Not on file  Social History Narrative   Not on file   Social Drivers of Health   Financial Resource Strain: Low Risk  (01/04/2023)   Received from [facility]   Overall Financial Resource Strain (CARDIA)    Difficulty of Paying Living Expenses: Not hard at all  Food Insecurity: No Food Insecurity (01/04/2023)   Received from [facility]   Hunger Vital Sign    Worried About Running Out of Food in the Last Year: Never true    Ran Out of Food in the Last Year: Never true  Transportation Needs: No Transportation Needs (01/04/2023)   Received from [facility] - Transportation    In the past 12 months, has lack of transportation kept you from medical appointments or from  getting medications?: No    Lack of Transportation (Non-Medical): No  Physical Activity: Sufficiently Active (01/04/2023)   Received from [facility]   Exercise Vital Sign    Days of Exercise per Week: 5 days    Minutes of Exercise per Session: 30 min  Stress: No Stress Concern Present (10/04/2022)   [employer] of Occupational Health - Occupational Stress Questionnaire    Feeling of Stress : Not at all  Social Connections: Moderately Integrated (10/04/2022)   Social Connection and Isolation Panel [NHANES]    Frequency of Communication with Friends and Family: More than three times a week    Frequency of Social Gatherings with Friends and Family: Three times a week    Attends Religious Services: Never    Active Member of Clubs or Organizations: Yes    Attends Banker Meetings: More than 4 times per year    Marital Status: Married  Catering manager Violence: Not At Risk (09/27/2022)   Humiliation, Afraid, Rape, and Kick questionnaire    Fear of Current or Ex-Partner: No    Emotionally Abused: No    Physically Abused: No    Sexually Abused: No    Past Surgical History:  Procedure Laterality Date   ABDOMINAL HYSTERECTOMY     has left ovary   APPENDECTOMY     ATRIAL FIBRILLATION ABLATION N/A 05/31/2021   Procedure: ATRIAL FIBRILLATION ABLATION;  Surgeon: Lanier Prude, MD;  Location: [facility] INVASIVE CV LAB;  Service: Cardiovascular;  Laterality: N/A;   CARDIAC CATHETERIZATION  09/2005   had 1 stent and 3 balloons placed   CARDIOVERSION N/A 04/02/2021   Procedure: CARDIOVERSION;  Surgeon: Meriam Sprague, MD;  Location: [facility] ENDOSCOPY;  Service: Cardiovascular;  Laterality: N/A;   CARDIOVERSION N/A 07/28/2021   Procedure: CARDIOVERSION;  Surgeon: Maisie Fus, MD;  Location: [facility] ENDOSCOPY;  Service: Cardiovascular;  Laterality: N/A;   COLONOSCOPY WITH PROPOFOL N/A 12/04/2019   Procedure: COLONOSCOPY WITH PROPOFOL;  Surgeon: Earline Mayotte, MD;   Location: [facility] ENDOSCOPY;  Service: Endoscopy;  Laterality: N/A;   CORONARY ARTERY BYPASS GRAFT     SVT ABLATION N/A 09/04/2020   Procedure: SVT ABLATION;  Surgeon: Lanier Prude, MD;  Location: [facility] INVASIVE CV LAB;  Service: Cardiovascular;  Laterality: N/A;   TONSILLECTOMY      Family History  Problem Relation Age of Onset   Diabetes Mother    Stroke Mother        cause of death   Coronary artery disease Father    Heart attack Father    Hyperlipidemia Sister    Hypertension Sister    Breast cancer Sister 64   Cancer Sister        breast cancer   Asthma Sister    Scleroderma Sister    Thyroid disease Sister    Heart attack Brother    Cancer - Colon Paternal Grandmother    Breast cancer Maternal Aunt        16s    Allergies  Allergen Reactions   Adhesive [Tape] Other (See Comments)    Band-aids irritate skin   Azithromycin Diarrhea and Nausea And Vomiting   Eliquis [Apixaban] Swelling    Leg and ankle swelling       Latest Ref Rng & Units 03/13/2023    9:39 AM 07/28/2021    8:43 AM 05/11/2021    9:59 AM  CBC  WBC 4.0 - 10.5 K/uL 6.3  5.1  5.8   Hemoglobin 12.0 - 15.0 g/dL 62.1  30.8  65.7   Hematocrit 36.0 - 46.0 % 44.2  39.5  40.3   Platelets 150 - 400 K/uL 275  159  216       CMP     Component Value Date/Time   NA 140 03/13/2023 0939   NA 145 (H) 04/06/2022 0954   K 4.1 03/13/2023 0939   CL 103 03/13/2023 0939   CO2 25 03/13/2023 0939   GLUCOSE 110 (H) 03/13/2023 0939   BUN 18 03/13/2023 0939   BUN 15 04/06/2022 0954   CREATININE 0.83 03/13/2023 0939   CALCIUM 10.1 03/13/2023 0939   PROT 8.1 03/13/2023 0939   PROT 7.0 04/06/2022 0954   ALBUMIN 4.8 03/13/2023 0939   ALBUMIN 4.5 04/06/2022 0954   AST 34 03/13/2023 0939   ALT 26 03/13/2023 0939   ALKPHOS 98 03/13/2023 0939   BILITOT 1.1 03/13/2023 0939   BILITOT 0.5 04/06/2022 0954   EGFR 71 04/06/2022 0954   GFRNONAA >60 03/13/2023 0939     No results found.     Assessment & Plan:    1. PAD (peripheral artery disease) (HCC) The patient's continued walking activities have improved her claudication and she has not had progression to rest pain or ulcerations.   At this time the patient wishes to continue with activity to help improve perfusion this is certainly reasonable however if she feels that now the claudication has progressed to lifestyle limiting point, additional interventions would be warranted.  Given the improvement in her claudication symptoms and is certainly reasonable to continue with conservative therapies.  We discussed the use of Pletal can sometimes noted improvement for claudication symptoms for patients.  We also discussed more invasive options being lower extremity angiogram.  At this time the patient wishes to consider options.  Will plan on having the patient return in 6 months however if she decides she wants to move forward with angiogram or other options discussed, we can have her placed on the schedule for intervention.  Discussed that if the patient decides to move forward with intervention her left lower extremity is warm and the intervention to determine if there is a drastic improvement in symptoms since this is notably worse with ABIs and on duplex.  2. Pure hypercholesterolemia  Continue statin as ordered and reviewed, no changes at this time  3. Primary hypertension Continue antihypertensive medications as already ordered, these medications have been reviewed and there are no changes at this time.   Current Outpatient Medications on File Prior to Visit  Medication Sig Dispense Refill   albuterol (PROAIR HFA) 108 (90 BASE) MCG/ACT inhaler Inhale 2 puffs into the lungs every 6 (six) hours as needed for wheezing or shortness of breath. 3 Inhaler 3   amiodarone (PACERONE) 100 MG tablet Take 1 tablet (100 mg total) by mouth daily. 90 tablet 3   amLODipine (NORVASC) 5 MG tablet Take 1 tablet (5 mg total) by mouth daily. 90 tablet 3   atorvastatin  (LIPITOR) 80 MG tablet TAKE 1 TABLET BY MOUTH EVERY DAY 90 tablet 3   Azelastine HCl 137 MCG/SPRAY SOLN SPRAY 2 SPRAYS INTO EACH NOSTRIL EVERY DAY 30 mL 3   benazepril (LOTENSIN) 10 MG tablet Take 2 tablets (20 mg total) by mouth daily. 90 tablet 1   Calcium Carbonate (CALCIUM 600 PO) Take 600 mg by mouth in the morning and at bedtime.     cholecalciferol (VITAMIN D3) 25 MCG (1000 UNIT) tablet Take 1,000 Units by mouth daily.     empagliflozin (JARDIANCE) 10 MG TABS tablet Take 1 tablet by mouth daily.     furosemide (LASIX) 40 MG tablet Take 1 tablet (40 mg total) by mouth daily as needed (swelling/weight gain). 90 tablet 3   levothyroxine (SYNTHROID) 112 MCG tablet TAKE 1 TABLET BY MOUTH SIX DAYS PER WEEK. TAKE 1 AND 1/2 TABLET BY MOUTH ONE DAY PER WEEK (WEDNESDAYS) 100 tablet 2   Multiple Vitamin (MULTIVITAMIN) tablet Take 1 tablet by mouth daily. Standard Process     nitroGLYCERIN (NITROSTAT) 0.4 MG SL tablet Place 0.4 mg under the tongue every 5 (five) minutes as needed for chest pain.      triamcinolone (NASACORT) 55 MCG/ACT AERO nasal inhaler 2 sprays as needed.     umeclidinium-vilanterol (ANORO ELLIPTA) 62.5-25 MCG/ACT AEPB INHALE 1 PUFF BY MOUTH EVERY DAY 60 each 7   XARELTO 20 MG TABS tablet TAKE 1 TABLET BY MOUTH DAILY WITH SUPPER. STOP ELIQUIS 90 tablet 1   zolpidem (AMBIEN) 5 MG tablet Take 1 tablet (5 mg total) by mouth at bedtime as needed. for sleep 30 tablet 3   cetirizine (ZYRTEC) 10 MG tablet Take 10 mg by mouth daily as needed for allergies.     metoprolol tartrate (LOPRESSOR) 25 MG tablet Take 1 tablet (25 mg total) by mouth as needed (tachycardia). 45 tablet 3   mupirocin ointment (BACTROBAN) 2 % APPLY TO AFFECTED AREA TWICE A DAY (Patient not taking: No sig reported) 22 g 0   No current facility-administered medications on file prior to visit.    There are no Patient Instructions on file for this visit. No follow-ups on file.   Georgiana Spinner, NP

## 2023-04-26 DIAGNOSIS — Z9842 Cataract extraction status, left eye: Secondary | ICD-10-CM

## 2023-04-28 ENCOUNTER — Ambulatory Visit: Payer: Medicare HMO

## 2023-05-02 ENCOUNTER — Other Ambulatory Visit: Payer: Self-pay | Admitting: Physician Assistant

## 2023-05-02 ENCOUNTER — Ambulatory Visit: Payer: Medicare HMO | Admitting: Cardiology

## 2023-05-02 DIAGNOSIS — G4700 Insomnia, unspecified: Secondary | ICD-10-CM

## 2023-05-03 ENCOUNTER — Ambulatory Visit: Payer: Medicare HMO | Admitting: Cardiology

## 2023-05-19 ENCOUNTER — Encounter (HOSPITAL_COMMUNITY): Payer: Self-pay

## 2023-05-19 ENCOUNTER — Ambulatory Visit (HOSPITAL_COMMUNITY): Admit: 2023-05-19 | Payer: Medicare HMO | Admitting: Cardiology

## 2023-05-19 SURGERY — ATRIAL FIBRILLATION ABLATION
Anesthesia: General

## 2023-05-31 DIAGNOSIS — K219 Gastro-esophageal reflux disease without esophagitis: Secondary | ICD-10-CM | POA: Diagnosis not present

## 2023-05-31 DIAGNOSIS — I429 Cardiomyopathy, unspecified: Secondary | ICD-10-CM | POA: Diagnosis not present

## 2023-05-31 DIAGNOSIS — M1990 Unspecified osteoarthritis, unspecified site: Secondary | ICD-10-CM | POA: Diagnosis not present

## 2023-05-31 DIAGNOSIS — D6869 Other thrombophilia: Secondary | ICD-10-CM | POA: Diagnosis not present

## 2023-05-31 DIAGNOSIS — Z8249 Family history of ischemic heart disease and other diseases of the circulatory system: Secondary | ICD-10-CM | POA: Diagnosis not present

## 2023-05-31 DIAGNOSIS — I4891 Unspecified atrial fibrillation: Secondary | ICD-10-CM | POA: Diagnosis not present

## 2023-05-31 DIAGNOSIS — I700 Atherosclerosis of aorta: Secondary | ICD-10-CM | POA: Diagnosis not present

## 2023-05-31 DIAGNOSIS — I25119 Atherosclerotic heart disease of native coronary artery with unspecified angina pectoris: Secondary | ICD-10-CM | POA: Diagnosis not present

## 2023-05-31 DIAGNOSIS — R32 Unspecified urinary incontinence: Secondary | ICD-10-CM | POA: Diagnosis not present

## 2023-05-31 DIAGNOSIS — I771 Stricture of artery: Secondary | ICD-10-CM | POA: Diagnosis not present

## 2023-05-31 DIAGNOSIS — I501 Left ventricular failure: Secondary | ICD-10-CM | POA: Diagnosis not present

## 2023-05-31 DIAGNOSIS — I130 Hypertensive heart and chronic kidney disease with heart failure and stage 1 through stage 4 chronic kidney disease, or unspecified chronic kidney disease: Secondary | ICD-10-CM | POA: Diagnosis not present

## 2023-06-06 ENCOUNTER — Other Ambulatory Visit: Payer: Self-pay | Admitting: Physician Assistant

## 2023-06-06 DIAGNOSIS — M25473 Effusion, unspecified ankle: Secondary | ICD-10-CM

## 2023-06-16 ENCOUNTER — Other Ambulatory Visit: Payer: Self-pay | Admitting: Physician Assistant

## 2023-06-16 DIAGNOSIS — E039 Hypothyroidism, unspecified: Secondary | ICD-10-CM

## 2023-06-18 ENCOUNTER — Emergency Department: Payer: Medicare HMO

## 2023-06-18 ENCOUNTER — Other Ambulatory Visit: Payer: Self-pay

## 2023-06-18 ENCOUNTER — Emergency Department
Admission: EM | Admit: 2023-06-18 | Discharge: 2023-06-18 | Disposition: A | Discharge status: Home / Self Care | Payer: Medicare HMO | Attending: Emergency Medicine | Admitting: Emergency Medicine

## 2023-06-18 DIAGNOSIS — I2510 Atherosclerotic heart disease of native coronary artery without angina pectoris: Secondary | ICD-10-CM | POA: Insufficient documentation

## 2023-06-18 DIAGNOSIS — I509 Heart failure, unspecified: Secondary | ICD-10-CM | POA: Insufficient documentation

## 2023-06-18 DIAGNOSIS — I4892 Unspecified atrial flutter: Secondary | ICD-10-CM | POA: Insufficient documentation

## 2023-06-18 DIAGNOSIS — R002 Palpitations: Secondary | ICD-10-CM | POA: Diagnosis not present

## 2023-06-18 DIAGNOSIS — Z7901 Long term (current) use of anticoagulants: Secondary | ICD-10-CM | POA: Diagnosis not present

## 2023-06-18 DIAGNOSIS — I110 Hypertensive heart disease with heart failure: Secondary | ICD-10-CM | POA: Insufficient documentation

## 2023-06-18 DIAGNOSIS — Z951 Presence of aortocoronary bypass graft: Secondary | ICD-10-CM | POA: Diagnosis not present

## 2023-06-18 DIAGNOSIS — R918 Other nonspecific abnormal finding of lung field: Secondary | ICD-10-CM | POA: Diagnosis not present

## 2023-06-18 DIAGNOSIS — I4891 Unspecified atrial fibrillation: Secondary | ICD-10-CM | POA: Diagnosis not present

## 2023-06-18 HISTORY — DX: Unspecified atrial fibrillation: I48.91

## 2023-06-18 LAB — CBC
HCT: 42 % (ref 36.0–46.0)
Hemoglobin: 14.1 g/dL (ref 12.0–15.0)
MCH: 31.6 pg (ref 26.0–34.0)
MCHC: 33.6 g/dL (ref 30.0–36.0)
MCV: 94.2 fL (ref 80.0–100.0)
Platelets: 197 10*3/uL (ref 150–400)
RBC: 4.46 MIL/uL (ref 3.87–5.11)
RDW: 18.2 % — ABNORMAL HIGH (ref 11.5–15.5)
WBC: 7.6 10*3/uL (ref 4.0–10.5)
nRBC: 0 % (ref 0.0–0.2)

## 2023-06-18 LAB — BASIC METABOLIC PANEL
Anion gap: 11 (ref 5–15)
BUN: 23 mg/dL (ref 8–23)
CO2: 20 mmol/L — ABNORMAL LOW (ref 22–32)
Calcium: 9.3 mg/dL (ref 8.9–10.3)
Chloride: 104 mmol/L (ref 98–111)
Creatinine, Ser: 1.05 mg/dL — ABNORMAL HIGH (ref 0.44–1.00)
GFR, Estimated: 55 mL/min — ABNORMAL LOW (ref >60)
Glucose, Bld: 98 mg/dL (ref 70–99)
Potassium: 5.1 mmol/L (ref 3.5–5.1)
Sodium: 135 mmol/L (ref 135–145)

## 2023-06-18 LAB — TROPONIN I (HIGH SENSITIVITY): Troponin I (High Sensitivity): 52 ng/L — ABNORMAL HIGH (ref <18)

## 2023-06-18 MED ORDER — MIDAZOLAM HCL (PF) 10 MG/2ML IJ SOLN
3.0000 mg | Freq: Once | INTRAMUSCULAR | Status: AC
  Administered 2023-06-18: 2 mg via INTRAVENOUS
  Filled 2023-06-18: qty 2

## 2023-06-18 MED ORDER — ADENOSINE 12 MG/4ML IV SOLN
12.0000 mg | Freq: Once | INTRAVENOUS | Status: AC
  Administered 2023-06-18: 12 mg via INTRAVENOUS
  Filled 2023-06-18: qty 4

## 2023-06-18 MED ORDER — MIDAZOLAM HCL (PF) 10 MG/2ML IJ SOLN
5.0000 mg | Freq: Once | INTRAMUSCULAR | Status: AC
  Administered 2023-06-18: 5 mg via INTRAVENOUS
  Filled 2023-06-18: qty 2

## 2023-06-18 NOTE — Consult Note (Signed)
 Brief  cardiology consult note Pt called saying she was having tachycardia Hx AFLutter RVR On anticoug xarelto and has not missed any doses Pt also on amiodarone po No B-blocker or Ca# blocker because of bradycardia Consider adenosine 12 mg x 51first  I recommend DCCV biphasic 100-200 j  Have her f/u with cardiology once converted  Penny Reed

## 2023-06-18 NOTE — ED Notes (Signed)
 Post procedure EKG performed and in chart.

## 2023-06-18 NOTE — CV Procedure (Signed)
 Electrical Cardioversion Procedure Note   Procedure: Electrical Cardioversion Indications:  Atrial FlutterRVR  140  Procedure Details Consent: Risks of procedure as well as the alternatives and risks of each were explained to the (patient/caregiver).  Consent for procedure obtained. Time Out: Verified patient identification, verified procedure, site/side was marked, verified correct patient position, special equipment/implants available, medications/allergies/relevent history reviewed, required imaging and test results available.  Performed  Patient placed on cardiac monitor, pulse oximetry, supplemental oxygen as necessary.  Sedation given: Benzodiazepines Pacer pads placed anterior and posterior chest.  Cardioverted 1 time(s).  Cardioverted at 120J.  Evaluation Findings: Post procedure EKG shows: NSR Complications: None Patient did tolerate procedure well.   Dorothyann Peng MD 06/17/22  Dr Marisa Severin  ER Attending Supervised the procedure

## 2023-06-18 NOTE — ED Provider Notes (Signed)
 [facility] Provider Note    Event Date/Time   First MD Initiated Contact with Patient 06/18/23 1507     (approximate)   History   a fib RVR sent by [facility]   HPI  Penny Reed is a 78 y.o. female with a history of CAD, hypertension, CHF, and atrial fibrillation on Xarelto who presents with palpitations, acute onset this morning, persistent course, associated with mild shortness of breath.  She has no chest pain.  She states that she has been compliant with her Xarelto and took it this morning.  She was instructed by Dr. Juliann Pares to come in for possible cardioversion.  She had the same thing done in the ED a few months ago.  I reviewed the past medical records.  The patient was seen in the ED on 11/18 with atrial fibrillation with RVR and was successfully cardioverted.  The patient most recently followed up with her cardiologist Dr. Allena Katz at [facility] on 2/4 for follow-up of her CHF.   Physical Exam   Triage Vital Signs: ED Triage Vitals  Encounter Vitals Group     BP 06/18/23 1440 108/72     Systolic BP Percentile --      Diastolic BP Percentile --      Pulse Rate 06/18/23 1440 (!) 142     Resp 06/18/23 1440 20     Temp 06/18/23 1440 97.8 F (36.6 C)     Temp Source 06/18/23 1440 Oral     SpO2 06/18/23 1440 98 %     Weight 06/18/23 1438 175 lb (79.4 kg)     Height 06/18/23 1438 5\' 7"  (1.702 m)     Head Circumference --      Peak Flow --      Pain Score 06/18/23 1437 0     Pain Loc --      Pain Education --      Exclude from Growth Chart --     Most recent vital signs: Vitals:   06/18/23 1618 06/18/23 1630  BP: (!) 115/55 (!) 100/57  Pulse: 60 (!) 58  Resp: 11 17  Temp:    SpO2: 100% 100%     General: Awake, no distress.  CV:  Good peripheral perfusion.  Tachycardic, regular rhythm. Resp:  Normal effort.  Abd:  No distention.  Other:  No peripheral edema.   ED Results / Procedures / Treatments   Labs (all labs ordered are  listed, but only abnormal results are displayed) Labs Reviewed  BASIC METABOLIC PANEL - Abnormal; Notable for the following components:      Result Value   CO2 20 (*)    Creatinine, Ser 1.05 (*)    GFR, Estimated 55 (*)    All other components within normal limits  CBC - Abnormal; Notable for the following components:   RDW 18.2 (*)    All other components within normal limits  TROPONIN I (HIGH SENSITIVITY) - Abnormal; Notable for the following components:   Troponin I (High Sensitivity) 52 (*)    All other components within normal limits     EKG  ED ECG REPORT I, Dionne Bucy, the attending physician, personally viewed and interpreted this ECG.  Date: 06/18/2023 EKG Time: 1438 Rate: 142 Rhythm: Atrial fibrillation/flutter with RVR QRS Axis: normal Intervals: LBBB ST/T Wave abnormalities: Nonspecific abnormalities Narrative Interpretation: no evidence of acute ischemia; new LBBB when compared to EKGs of November 2024   ED ECG REPORT I, Dionne Bucy, the attending physician,  personally viewed and interpreted this ECG.  Date: 06/18/2023 EKG Time: 1542 Rate: 67 Rhythm: normal sinus rhythm QRS Axis: normal Intervals: normal ST/T Wave abnormalities: LVH with repolarization abnormality Narrative Interpretation: no evidence of acute ischemia   RADIOLOGY  Chest x-ray: I independently viewed and interpreted the images; there is possible mild atelectasis with no focal consolidation or edema  PROCEDURES:  Critical Care performed: Yes, see critical care procedure note(s)  .Critical Care  Performed by: Dionne Bucy, MD Authorized by: Dionne Bucy, MD   Critical care provider statement:    Critical care time (minutes):  30   Critical care time was exclusive of:  Separately billable procedures and treating other patients   Critical care was necessary to treat or prevent imminent or life-threatening deterioration of the following conditions:   Cardiac failure   Critical care was time spent personally by me on the following activities:  Development of treatment plan with patient or surrogate, discussions with consultants, evaluation of patient's response to treatment, examination of patient, ordering and review of laboratory studies, ordering and review of radiographic studies, ordering and performing treatments and interventions, pulse oximetry, re-evaluation of patient's condition, review of old charts and obtaining history from patient or surrogate   Care discussed with: admitting provider   .Cardioversion  Date/Time: 06/18/2023 4:45 PM  Performed by: Dionne Bucy, MD Authorized by: Dionne Bucy, MD   Consent:    Consent obtained:  Verbal   Consent given by:  Patient   Risks discussed:  Induced arrhythmia, death and pain   Alternatives discussed:  Rate-control medication, anti-coagulation medication, observation, delayed treatment and alternative treatment Universal protocol:    Patient identity confirmed:  Verbally with patient Pre-procedure details:    Cardioversion basis:  Elective   Rhythm:  Atrial flutter   Electrode placement:  Anterior-posterior Patient sedated: No Attempt one:    Cardioversion mode:  Synchronous   Shock (Joules):  120   Shock outcome:  Conversion to normal sinus rhythm Post-procedure details:    Patient status:  Alert   Patient tolerance of procedure:  Tolerated well, no immediate complications Comments:     The procedure was supervised by me and performed by Dr. Juliann Pares.    The patient did not undergo moderate sedation, but instead was given Versed for anxiolysis/amnesia.  An initial 3 mg, then 2 mg was given as requested by Dr. Juliann Pares.  Subsequently Dr. Juliann Pares requested an additional 2 mg afterwards to help with anxiolysis.  Post procedure, the patient has returned to baseline mental status.        MEDICATIONS ORDERED IN ED: Medications  adenosine (ADENOCARD) 12  MG/4ML injection 12 mg (12 mg Intravenous Given 06/18/23 1514)  midazolam PF (VERSED) injection 5 mg (5 mg Intravenous Given 06/18/23 1522)  midazolam PF (VERSED) injection 3 mg (2 mg Intravenous Given 06/18/23 1531)     IMPRESSION / MDM / ASSESSMENT AND PLAN / ED COURSE  I reviewed the triage vital signs and the nursing notes.  78 year old female with PMH as noted above presents with palpitations since this morning.  She is on Xarelto for atrial fibrillation/flutter and states that she has been compliant.  On exam the patient is well-appearing.  Her vital signs are normal except for tachycardia to the 140s.  There is no clinical evidence of new onset CHF or other complication.  Differential diagnosis includes, but is not limited to, atrial fibrillation/flutter.  There is no evidence of ACS.  There is no evidence that the patient has developed  a CHF exacerbation.  I consulted and discussed the case with Dr. Juliann Pares who had talked to the patient and sent her into the ED.  He recommends proceeding with electrical cardioversion as medication therapies have failed previously with the patient.  The patient is in agreement with this plan.  Dr. Nydia Bouton performed the procedure with me supervising.  He verbally consented the patient.  He requested Versed for anxiolysis; the patient did not require full moderate sedation.  Cardioversion was successful in the first attempt with a return to normal sinus rhythm.  Dr. Mariah Milling requested additional Versed to help with anxiolysis/amnesia.  We will obtain labs and reassess.  Patient's presentation is most consistent with acute presentation with potential threat to life or bodily function.  The patient is on the cardiac monitor to evaluate for evidence of arrhythmia and/or significant heart rate changes.  ----------------------------------------- 4:51 PM on 06/18/2023 -----------------------------------------  On reassessment, the patient is fully alert and  oriented. BMP and CBC show no acute findings.  Troponin is minimally elevated although this is likely due to the patient's elevated heart rate earlier.  She is not having any chest pain.  Repeat EKG is nonischemic.  I discussed this finding with the patient.  This time there is no indication for a repeat troponin since there is no clinical evidence for ACS and a repeat may be elevated to the cardioversion which would not be of any clinical utility.  The patient is in agreement with this.  She feels well and would like to go home.  She is stable for discharge at this time.  Return precautions provided, she expresses understanding.   FINAL CLINICAL IMPRESSION(S) / ED DIAGNOSES   Final diagnoses:  Atrial flutter, unspecified type (HCC)     Rx / DC Orders   ED Discharge Orders     None        Note:  This document was prepared using Dragon voice recognition software and may include unintentional dictation errors.    Dionne Bucy, MD 06/18/23 [phone]

## 2023-06-18 NOTE — ED Notes (Signed)
 Patient awake AOX4. MD, Siadecki notified.

## 2023-06-18 NOTE — ED Notes (Addendum)
 Patient received 7mg  TOTAL of Versed from verbal orders per [facility], MD Cardiologist at bedside. Patient now in Sinus Rhythm with HR at 68. Patients on 2L Oxygen with saturations at 96%. RR: 15.

## 2023-06-18 NOTE — Discharge Instructions (Signed)
 Follow-up with Dr. Juliann Pares.  Return to the ER for new, worsening, or persistent severe palpitations, chest pain, shortness of breath, weakness, or any other new or worsening symptoms that concern you.

## 2023-06-18 NOTE — ED Triage Notes (Signed)
 Pt to ED POV sent by Dr Juliann Pares for possible a fib RVR. Pt began feeling like heart was beating fast this morning around 10am. Took normal AM meds this AM including amiodraone. Also takes Xarelto and took this AM.  Pt was cardioverted 2 months ago.  Denies dizziness, CP. Slightly SOB.   EKG HR is 142.

## 2023-06-18 NOTE — ED Notes (Signed)
 Cardiologist at bedside performing cardioversion. Patient received 5mg  of versed.

## 2023-06-18 NOTE — Progress Notes (Signed)
 RT called to bed side for Cardioversion.

## 2023-06-30 DIAGNOSIS — H25813 Combined forms of age-related cataract, bilateral: Secondary | ICD-10-CM | POA: Diagnosis not present

## 2023-07-12 DIAGNOSIS — E079 Disorder of thyroid, unspecified: Secondary | ICD-10-CM | POA: Diagnosis not present

## 2023-07-12 DIAGNOSIS — I482 Chronic atrial fibrillation, unspecified: Secondary | ICD-10-CM | POA: Diagnosis not present

## 2023-07-19 ENCOUNTER — Other Ambulatory Visit: Payer: Self-pay

## 2023-07-19 ENCOUNTER — Encounter: Payer: Self-pay | Admitting: Cardiology

## 2023-07-19 ENCOUNTER — Ambulatory Visit: Payer: Medicare HMO | Attending: Cardiology | Admitting: Cardiology

## 2023-07-19 VITALS — BP 110/56 | HR 48 | Ht 67.0 in | Wt 178.0 lb

## 2023-07-19 DIAGNOSIS — I4819 Other persistent atrial fibrillation: Secondary | ICD-10-CM

## 2023-07-19 DIAGNOSIS — I484 Atypical atrial flutter: Secondary | ICD-10-CM | POA: Diagnosis not present

## 2023-07-19 DIAGNOSIS — Z79899 Other long term (current) drug therapy: Secondary | ICD-10-CM

## 2023-07-19 NOTE — Progress Notes (Signed)
 Electrophysiology Office Follow up Visit Note:    Date:  07/19/2023   ID:  Penny Reed, DOB 05/10/1945, MRN [id number]  PCP:  Bosie Clos, MD  [facility] Cardiologist:  Alwyn Pea, MD  [facility] Electrophysiologist:  Lanier Prude, MD    Interval History:     Penny Reed is a 78 y.o. female who presents for a follow up visit.   I last saw the patient Sep 01, 2021 for her persistent atrial fibrillation.  She had a prior PVI and CTI ablation May 31, 2021.  She did require cardioversion after the procedure.  She ultimately experienced recurrence of atrial fibrillation with scheduled for redo catheter ablation on May 19, 2023.  She send noted on April 21, 2023 saying that she wanted to cancel the ablation because she had not had a recurrence of arrhythmia.  June 18, 2023 she presented to the emergency department with atrial fibrillation.  In the emergency department she received a cardioversion.  She is on Xarelto for stroke prophylaxis  She is with her husband today in clinic who I have previously met.  She reports not tolerating higher doses of amiodarone.  She is currently taking 100 mg by mouth once daily.  She takes Xarelto for stroke prophylaxis without missed doses.        Past medical, surgical, social and family history were reviewed.  ROS:   Please see the history of present illness.    All other systems reviewed and are negative.  EKGs/Labs/Other Studies Reviewed:    The following studies were reviewed today:  Over 24th 2025 EKG reviewed shows tachycardia with a ventricular rate of 142 bpm.  Appears consistent with atrial flutter.  March 16, 2023 EKG shows sinus bradycardia, PVC, incomplete left bundle branch block EKG Interpretation Date/Time:  Wednesday July 19 2023 15:21:07 EDT Ventricular Rate:  48 PR Interval:  214 QRS Duration:  98 QT Interval:  494 QTC Calculation: 441 R Axis:   27  Text  Interpretation: Sinus bradycardia with 1st degree A-V block Confirmed by Steffanie Dunn [phone]) on 07/19/2023 3:38:59 PM    Physical Exam:    VS:  BP (!) 110/56   Pulse (!) 48   Ht 5\' 7"  (1.702 m)   Wt 178 lb (80.7 kg)   SpO2 97%   BMI 27.88 kg/m     Wt Readings from Last 3 Encounters:  07/19/23 178 lb (80.7 kg)  06/18/23 175 lb (79.4 kg)  04/13/23 175 lb (79.4 kg)     GEN: no distress CARD: RRR, No MRG RESP: No IWOB. CTAB.      ASSESSMENT:    1. Persistent atrial fibrillation (HCC)   2. Atypical atrial flutter (HCC)   3. Encounter for long-term (current) use of high-risk medication    PLAN:    In order of problems listed above:  #Persistent atrial fibrillation and flutter #High risk med monitoring-amiodarone Prior catheter ablation May 31, 2021 during which the pulmonary veins, posterior wall and CTI were ablated.  She also had ablation of a perimitral atrial flutter.  She is experienced symptomatic recurrence.  She was scheduled originally for redo catheter ablation earlier this year but canceled given she had good control of her symptoms.  Continue Xarelto for stroke prophylaxis  I discussed treatment strategies for her recurrent atrial arrhythmias during clinic today.  We discussed antiarrhythmic drugs and catheter ablation.  She is currently on amiodarone 100 mg by mouth once daily.  Discussed treatment options  today for AF including antiarrhythmic drug therapy and ablation. Discussed risks, recovery and likelihood of success with each treatment strategy. Risk, benefits, and alternatives to EP study and ablation for afib were discussed. These risks include but are not limited to stroke, bleeding, vascular damage, tamponade, perforation, damage to the esophagus, lungs, phrenic nerve and other structures, pulmonary vein stenosis, worsening renal function, coronary vasospasm and death.  Discussed potential need for repeat ablation procedures and antiarrhythmic drugs  after an initial ablation. The patient understands these risk and wishes to proceed.  We will therefore proceed with catheter ablation at the next available time.  Carto, ICE, anesthesia are requested for the procedure.  Will also obtain CT PV protocol prior to the procedure to exclude LAA thrombus and further evaluate atrial anatomy.  Update CMP, TSH and free T4 today       Signed, Steffanie Dunn, MD, [facility], [facility] 07/19/2023 3:39 PM    Electrophysiology [facility]

## 2023-07-19 NOTE — Patient Instructions (Addendum)
 Medication Instructions:  Your physician recommends that you continue on your current medications as directed. Please refer to the Current Medication list given to you today.  *If you need a refill on your cardiac medications before your next appointment, please call your pharmacy*  Lab Work: CMET, TSH, T4, CBC - please have this done within 30 days of your procedure and any [facility] location  Testing/Procedures: Cardiac CT Your physician has requested that you have cardiac CT. Cardiac computed tomography (CT) is a painless test that uses an x-ray machine to take clear, detailed pictures of your heart. For further information please visit [email or web address] We will call you to schedule your CT scan. It will be done about three weeks prior to your ablation.  Ablation Your physician has recommended that you have an ablation. Catheter ablation is a medical procedure used to treat some cardiac arrhythmias (irregular heartbeats). During catheter ablation, a long, thin, flexible tube is put into a blood vessel in your groin (upper thigh), or neck. This tube is called an ablation catheter. It is then guided to your heart through the blood vessel. Radio frequency waves destroy small areas of heart tissue where abnormal heartbeats may cause an arrhythmia to start. You are scheduled for Atrial Fibrillation Ablation on Tuesday, May 27 with Dr. Steffanie Dunn.Please arrive at the Main Entrance A at [facility]: [address] [place], [place] [address] at 10:00 AM   Follow-Up: At [facility], you and your health needs are our priority.  As part of our continuing mission to provide you with exceptional heart care, we have created designated Provider Care Teams.  These Care Teams include your primary Cardiologist (physician) and Advanced Practice Providers (APPs -  Physician Assistants and Nurse Practitioners) who all work together to provide you with the care you need, when you need  it.  Your next appointment:   We will call you to arrange your follow up appointments

## 2023-08-11 ENCOUNTER — Other Ambulatory Visit (HOSPITAL_COMMUNITY): Payer: Self-pay | Admitting: Cardiology

## 2023-08-11 DIAGNOSIS — I4819 Other persistent atrial fibrillation: Secondary | ICD-10-CM

## 2023-08-14 NOTE — Telephone Encounter (Signed)
 Prescription refill request for Xarelto  received.  Indication:afib Last office visit:3/25 Weight:80.7  kg Age:78 Scr:1.05  2/25 CrCl:56.46  ml/min  Prescription refilled

## 2023-08-22 ENCOUNTER — Telehealth (HOSPITAL_COMMUNITY): Payer: Self-pay

## 2023-08-22 DIAGNOSIS — H25811 Combined forms of age-related cataract, right eye: Secondary | ICD-10-CM | POA: Diagnosis not present

## 2023-08-22 DIAGNOSIS — H52221 Regular astigmatism, right eye: Secondary | ICD-10-CM | POA: Diagnosis not present

## 2023-08-22 HISTORY — PX: CATARACT EXTRACTION W/ INTRAOCULAR LENS IMPLANT: SHX1309

## 2023-08-22 NOTE — Telephone Encounter (Signed)
 Spoke with patient to complete pre-procedure call.     New medical conditions? No Recent hospitalizations or surgeries? Pt reports she had cataract surgery on her right eye today and is scheduled for left eye surgery on 5/6.   Started any new medications? No Patient made aware to contact office to inform of any new medications started. Any changes in activities of daily living? No  Pre-procedure testing scheduled: CT on 08/30/23 and lab work ordered. Confirmed patient is taking Xarelto  daily. It was not necessary to hold for cataract surgery. Advised to continue taking medication before procedure or it may need to be rescheduled.  Confirmed patient is scheduled for Atrial Fibrillation Ablation  on Tuesday, May 27 with Dr. Harvie Liner. Instructed patient to arrive at the Main Entrance A at [facility]: [address] [place], [place] [address] and check in at Admitting at 8:00 AM.  Advised of plan to go home the same day and will only stay overnight if medically necessary. You MUST have a responsible adult to drive you home and MUST be with you the first 24 hours after you arrive home or your procedure could be cancelled.  Patient verbalized understanding to information provided and is agreeable to proceed with procedure.

## 2023-08-23 DIAGNOSIS — H25812 Combined forms of age-related cataract, left eye: Secondary | ICD-10-CM | POA: Diagnosis not present

## 2023-08-24 ENCOUNTER — Telehealth: Payer: Self-pay

## 2023-08-24 NOTE — Telephone Encounter (Signed)
 Attempted to call pt to go over details of her CT/Ablation procedure. No answer and her VM is full.  I sent Instruction letters via MyChart with my direct number.

## 2023-08-29 DIAGNOSIS — H25812 Combined forms of age-related cataract, left eye: Secondary | ICD-10-CM | POA: Diagnosis not present

## 2023-08-29 DIAGNOSIS — Z961 Presence of intraocular lens: Secondary | ICD-10-CM | POA: Diagnosis not present

## 2023-08-29 DIAGNOSIS — H52222 Regular astigmatism, left eye: Secondary | ICD-10-CM | POA: Diagnosis not present

## 2023-08-29 DIAGNOSIS — Z9841 Cataract extraction status, right eye: Secondary | ICD-10-CM | POA: Diagnosis not present

## 2023-08-29 HISTORY — PX: CATARACT EXTRACTION W/ INTRAOCULAR LENS IMPLANT: SHX1309

## 2023-08-30 ENCOUNTER — Ambulatory Visit
Admission: RE | Admit: 2023-08-30 | Discharge: 2023-08-30 | Disposition: A | Discharge status: Home / Self Care | Source: Ambulatory Visit | Attending: Cardiology | Admitting: Cardiology

## 2023-08-30 DIAGNOSIS — I484 Atypical atrial flutter: Secondary | ICD-10-CM

## 2023-08-30 DIAGNOSIS — I4819 Other persistent atrial fibrillation: Secondary | ICD-10-CM

## 2023-08-30 DIAGNOSIS — Z79899 Other long term (current) drug therapy: Secondary | ICD-10-CM

## 2023-08-30 MED ORDER — SODIUM CHLORIDE 0.9 % IV SOLN: INTRAVENOUS | Status: DC

## 2023-08-30 MED ORDER — IOHEXOL 350 MG/ML SOLN
75.0000 mL | Freq: Once | INTRAVENOUS | Status: AC | PRN
  Administered 2023-08-30: 75 mL via INTRAVENOUS

## 2023-09-04 DIAGNOSIS — I4891 Unspecified atrial fibrillation: Secondary | ICD-10-CM | POA: Diagnosis not present

## 2023-09-04 DIAGNOSIS — I5030 Unspecified diastolic (congestive) heart failure: Secondary | ICD-10-CM | POA: Diagnosis not present

## 2023-09-04 DIAGNOSIS — I4892 Unspecified atrial flutter: Secondary | ICD-10-CM | POA: Diagnosis not present

## 2023-09-04 DIAGNOSIS — I739 Peripheral vascular disease, unspecified: Secondary | ICD-10-CM | POA: Diagnosis not present

## 2023-09-08 ENCOUNTER — Ambulatory Visit: Payer: Self-pay | Admitting: Cardiology

## 2023-09-08 ENCOUNTER — Other Ambulatory Visit
Admission: RE | Admit: 2023-09-08 | Discharge: 2023-09-08 | Disposition: A | Discharge status: Home / Self Care | Attending: Cardiology | Admitting: Cardiology

## 2023-09-08 DIAGNOSIS — I4819 Other persistent atrial fibrillation: Secondary | ICD-10-CM | POA: Diagnosis not present

## 2023-09-08 DIAGNOSIS — I484 Atypical atrial flutter: Secondary | ICD-10-CM | POA: Insufficient documentation

## 2023-09-08 DIAGNOSIS — Z79899 Other long term (current) drug therapy: Secondary | ICD-10-CM | POA: Insufficient documentation

## 2023-09-08 LAB — T4, FREE: Free T4: 1.06 ng/dL (ref 0.61–1.12)

## 2023-09-08 LAB — COMPREHENSIVE METABOLIC PANEL WITH GFR
ALT: 39 U/L (ref 0–44)
AST: 37 U/L (ref 15–41)
Albumin: 4.2 g/dL (ref 3.5–5.0)
Alkaline Phosphatase: 76 U/L (ref 38–126)
Anion gap: 13 (ref 5–15)
BUN: 33 mg/dL — ABNORMAL HIGH (ref 8–23)
CO2: 27 mmol/L (ref 22–32)
Calcium: 9.4 mg/dL (ref 8.9–10.3)
Chloride: 96 mmol/L — ABNORMAL LOW (ref 98–111)
Creatinine, Ser: 1.62 mg/dL — ABNORMAL HIGH (ref 0.44–1.00)
GFR, Estimated: 33 mL/min — ABNORMAL LOW (ref >60)
Glucose, Bld: 93 mg/dL (ref 70–99)
Potassium: 3.7 mmol/L (ref 3.5–5.1)
Sodium: 136 mmol/L (ref 135–145)
Total Bilirubin: 1.1 mg/dL (ref 0.0–1.2)
Total Protein: 7 g/dL (ref 6.5–8.1)

## 2023-09-08 LAB — CBC
HCT: 37.2 % (ref 36.0–46.0)
Hemoglobin: 12.6 g/dL (ref 12.0–15.0)
MCH: 33.2 pg (ref 26.0–34.0)
MCHC: 33.9 g/dL (ref 30.0–36.0)
MCV: 97.9 fL (ref 80.0–100.0)
Platelets: 233 10*3/uL (ref 150–400)
RBC: 3.8 MIL/uL — ABNORMAL LOW (ref 3.87–5.11)
RDW: 13.2 % (ref 11.5–15.5)
WBC: 6.2 10*3/uL (ref 4.0–10.5)
nRBC: 0 % (ref 0.0–0.2)

## 2023-09-08 LAB — TSH: TSH: 9.226 u[IU]/mL — ABNORMAL HIGH (ref 0.350–4.500)

## 2023-09-12 ENCOUNTER — Telehealth (HOSPITAL_COMMUNITY): Payer: Self-pay

## 2023-09-12 ENCOUNTER — Encounter (INDEPENDENT_AMBULATORY_CARE_PROVIDER_SITE_OTHER): Payer: Self-pay

## 2023-09-12 NOTE — Telephone Encounter (Signed)
 Attempted to reach patient to discuss upcoming procedure, no answer. Unable to leave VM, mailbox full.

## 2023-09-15 NOTE — Pre-Procedure Instructions (Signed)
 Instructed patient on the following items: Arrival time 0800 Nothing to eat or drink after midnight No meds AM of procedure Responsible person to drive you home and stay with you for 24 hrs  Have you missed any doses of anti-coagulant Xarelto - takes once a day, hasn't missed any dose in last 4 weeks.

## 2023-09-18 ENCOUNTER — Telehealth: Payer: Self-pay | Admitting: Home Health

## 2023-09-18 NOTE — Telephone Encounter (Signed)
 Patient paged after hour line, called back, she did not answer, voice mail left for call back.

## 2023-09-18 NOTE — Telephone Encounter (Signed)
 Patient is scheduled for A fib ablation with Dr Marven Slimmer tomorrow at 8am. She states she suppose to repeat blood work but can't find any place to get blood today as everything is closed. She had AKI with Cr 1.62 and elevated TSH 9.226 on 09/08/23. Communicated with Dr Marven Slimmer, who is [facility] for the patient to arrive early tomorrow to [facility] and get the blood work before the procedure. Will message coordinator Tisa Forester to assist the patient tomorrow, she can arrive at 7am for lab tomorrow. Patient agreed.

## 2023-09-19 ENCOUNTER — Ambulatory Visit (HOSPITAL_COMMUNITY): Payer: Self-pay | Admitting: Anesthesiology

## 2023-09-19 ENCOUNTER — Other Ambulatory Visit: Payer: Self-pay

## 2023-09-19 ENCOUNTER — Other Ambulatory Visit (HOSPITAL_COMMUNITY): Payer: Self-pay

## 2023-09-19 ENCOUNTER — Ambulatory Visit (HOSPITAL_COMMUNITY)
Admission: RE | Admit: 2023-09-19 | Discharge: 2023-09-19 | Disposition: A | Discharge status: Home / Self Care | Attending: Cardiology | Admitting: Cardiology

## 2023-09-19 ENCOUNTER — Ambulatory Visit (HOSPITAL_BASED_OUTPATIENT_CLINIC_OR_DEPARTMENT_OTHER): Payer: Self-pay | Admitting: Anesthesiology

## 2023-09-19 ENCOUNTER — Encounter (HOSPITAL_COMMUNITY)
Admission: RE | Disposition: A | Discharge status: Home / Self Care | Payer: Self-pay | Source: Home / Self Care | Attending: Cardiology

## 2023-09-19 DIAGNOSIS — Z7901 Long term (current) use of anticoagulants: Secondary | ICD-10-CM | POA: Insufficient documentation

## 2023-09-19 DIAGNOSIS — I484 Atypical atrial flutter: Secondary | ICD-10-CM | POA: Diagnosis not present

## 2023-09-19 DIAGNOSIS — J449 Chronic obstructive pulmonary disease, unspecified: Secondary | ICD-10-CM | POA: Diagnosis not present

## 2023-09-19 DIAGNOSIS — I2510 Atherosclerotic heart disease of native coronary artery without angina pectoris: Secondary | ICD-10-CM | POA: Insufficient documentation

## 2023-09-19 DIAGNOSIS — I4819 Other persistent atrial fibrillation: Secondary | ICD-10-CM

## 2023-09-19 DIAGNOSIS — I25119 Atherosclerotic heart disease of native coronary artery with unspecified angina pectoris: Secondary | ICD-10-CM | POA: Diagnosis not present

## 2023-09-19 DIAGNOSIS — I10 Essential (primary) hypertension: Secondary | ICD-10-CM

## 2023-09-19 DIAGNOSIS — Z87891 Personal history of nicotine dependence: Secondary | ICD-10-CM | POA: Diagnosis not present

## 2023-09-19 DIAGNOSIS — I739 Peripheral vascular disease, unspecified: Secondary | ICD-10-CM | POA: Diagnosis not present

## 2023-09-19 DIAGNOSIS — I4891 Unspecified atrial fibrillation: Secondary | ICD-10-CM | POA: Diagnosis not present

## 2023-09-19 DIAGNOSIS — Z79899 Other long term (current) drug therapy: Secondary | ICD-10-CM | POA: Diagnosis not present

## 2023-09-19 DIAGNOSIS — G4730 Sleep apnea, unspecified: Secondary | ICD-10-CM | POA: Diagnosis not present

## 2023-09-19 HISTORY — PX: ATRIAL FIBRILLATION ABLATION: EP1191

## 2023-09-19 LAB — BASIC METABOLIC PANEL WITH GFR
Anion gap: 10 (ref 5–15)
BUN: 17 mg/dL (ref 8–23)
CO2: 26 mmol/L (ref 22–32)
Calcium: 9.2 mg/dL (ref 8.9–10.3)
Chloride: 103 mmol/L (ref 98–111)
Creatinine, Ser: 0.89 mg/dL (ref 0.44–1.00)
GFR, Estimated: >60 mL/min (ref >60)
Glucose, Bld: 105 mg/dL — ABNORMAL HIGH (ref 70–99)
Potassium: 3.8 mmol/L (ref 3.5–5.1)
Sodium: 139 mmol/L (ref 135–145)

## 2023-09-19 LAB — POCT ACTIVATED CLOTTING TIME: Activated Clotting Time: 366 s

## 2023-09-19 SURGERY — ATRIAL FIBRILLATION ABLATION
Anesthesia: General

## 2023-09-19 MED ORDER — SUGAMMADEX SODIUM 200 MG/2ML IV SOLN
INTRAVENOUS | Status: DC | PRN
  Administered 2023-09-19: 200 mg via INTRAVENOUS

## 2023-09-19 MED ORDER — PROTAMINE SULFATE 10 MG/ML IV SOLN
INTRAVENOUS | Status: DC | PRN
  Administered 2023-09-19: 35 mg via INTRAVENOUS

## 2023-09-19 MED ORDER — HEPARIN SODIUM (PORCINE) 1000 UNIT/ML IJ SOLN
INTRAMUSCULAR | Status: DC | PRN
  Administered 2023-09-19: 12000 [IU] via INTRAVENOUS

## 2023-09-19 MED ORDER — ONDANSETRON HCL 4 MG/2ML IJ SOLN: 4.0000 mg | Freq: Four times a day (QID) | INTRAMUSCULAR | Status: DC | PRN

## 2023-09-19 MED ORDER — PHENYLEPHRINE HCL-NACL 20-0.9 MG/250ML-% IV SOLN
INTRAVENOUS | Status: DC | PRN
  Administered 2023-09-19: 50 ug/min via INTRAVENOUS

## 2023-09-19 MED ORDER — DEXAMETHASONE SODIUM PHOSPHATE 10 MG/ML IJ SOLN
INTRAMUSCULAR | Status: DC | PRN
  Administered 2023-09-19: 10 mg via INTRAVENOUS

## 2023-09-19 MED ORDER — PANTOPRAZOLE SODIUM 40 MG PO TBEC
40.0000 mg | DELAYED_RELEASE_TABLET | Freq: Every day | ORAL | 0 refills | Status: DC
  Filled 2023-09-19: qty 45, 45d supply, fill #0

## 2023-09-19 MED ORDER — PROPOFOL 10 MG/ML IV BOLUS
INTRAVENOUS | Status: DC | PRN
  Administered 2023-09-19: 110 mg via INTRAVENOUS

## 2023-09-19 MED ORDER — FENTANYL CITRATE (PF) 100 MCG/2ML IJ SOLN
INTRAMUSCULAR | Status: AC
  Filled 2023-09-19: qty 2

## 2023-09-19 MED ORDER — ACETAMINOPHEN 325 MG PO TABS: 650.0000 mg | ORAL_TABLET | ORAL | Status: DC | PRN

## 2023-09-19 MED ORDER — SODIUM CHLORIDE 0.9% FLUSH: 3.0000 mL | INTRAVENOUS | Status: DC | PRN

## 2023-09-19 MED ORDER — ONDANSETRON HCL 4 MG/2ML IJ SOLN
INTRAMUSCULAR | Status: DC | PRN
  Administered 2023-09-19: 4 mg via INTRAVENOUS

## 2023-09-19 MED ORDER — SODIUM CHLORIDE 0.9 % IV SOLN: 250.0000 mL | INTRAVENOUS | Status: DC | PRN

## 2023-09-19 MED ORDER — SODIUM CHLORIDE 0.9% FLUSH: 3.0000 mL | Freq: Two times a day (BID) | INTRAVENOUS | Status: DC

## 2023-09-19 MED ORDER — FENTANYL CITRATE (PF) 250 MCG/5ML IJ SOLN
INTRAMUSCULAR | Status: DC | PRN
  Administered 2023-09-19: 100 ug via INTRAVENOUS

## 2023-09-19 MED ORDER — SODIUM CHLORIDE 0.9 % IV SOLN
INTRAVENOUS | Status: DC
Start: 2023-09-19 — End: 2023-09-19

## 2023-09-19 MED ORDER — LIDOCAINE 2% (20 MG/ML) 5 ML SYRINGE
INTRAMUSCULAR | Status: DC | PRN
  Administered 2023-09-19: 40 mg via INTRAVENOUS

## 2023-09-19 MED ORDER — ATROPINE SULFATE 1 MG/10ML IJ SOSY
PREFILLED_SYRINGE | INTRAMUSCULAR | Status: DC | PRN
  Administered 2023-09-19: 1 mg via INTRAVENOUS

## 2023-09-19 MED ORDER — PANTOPRAZOLE SODIUM 40 MG PO TBEC
40.0000 mg | DELAYED_RELEASE_TABLET | Freq: Every day | ORAL | Status: DC
  Administered 2023-09-19: 40 mg via ORAL
  Filled 2023-09-19: qty 1

## 2023-09-19 MED ORDER — EPHEDRINE SULFATE-NACL 50-0.9 MG/10ML-% IV SOSY
PREFILLED_SYRINGE | INTRAVENOUS | Status: DC | PRN
  Administered 2023-09-19: 10 mg via INTRAVENOUS

## 2023-09-19 MED ORDER — HEPARIN (PORCINE) IN NACL 1000-0.9 UT/500ML-% IV SOLN
INTRAVENOUS | Status: DC | PRN
  Administered 2023-09-19 (×3): 500 mL

## 2023-09-19 MED ORDER — ATROPINE SULFATE 1 MG/10ML IJ SOSY
PREFILLED_SYRINGE | INTRAMUSCULAR | Status: AC
  Filled 2023-09-19: qty 10

## 2023-09-19 MED ORDER — COLCHICINE 0.6 MG PO TABS
0.6000 mg | ORAL_TABLET | Freq: Two times a day (BID) | ORAL | 0 refills | Status: DC
  Filled 2023-09-19: qty 10, 5d supply, fill #0

## 2023-09-19 MED ORDER — FENTANYL CITRATE (PF) 100 MCG/2ML IJ SOLN
25.0000 ug | Freq: Once | INTRAMUSCULAR | Status: AC
  Administered 2023-09-19: 25 ug via INTRAVENOUS

## 2023-09-19 MED ORDER — ROCURONIUM BROMIDE 10 MG/ML (PF) SYRINGE
PREFILLED_SYRINGE | INTRAVENOUS | Status: DC | PRN
  Administered 2023-09-19: 50 mg via INTRAVENOUS

## 2023-09-19 MED ORDER — COLCHICINE 0.6 MG PO TABS
0.6000 mg | ORAL_TABLET | Freq: Two times a day (BID) | ORAL | Status: DC
  Administered 2023-09-19: 0.6 mg via ORAL
  Filled 2023-09-19: qty 1

## 2023-09-19 SURGICAL SUPPLY — 18 items
BAG SNAP BAND KOVER 36X36 (MISCELLANEOUS) IMPLANT
CABLE PFA RX CATH CONN (CABLE) IMPLANT
CATH FARAWAVE ABLATION 31 (CATHETERS) IMPLANT
CATH GE 8FR SOUNDSTAR (CATHETERS) IMPLANT
CATH OCTARAY 2.0 F 3-3-3-3-3 (CATHETERS) IMPLANT
CATH WEBSTER BI DIR CS D-F CRV (CATHETERS) IMPLANT
CLOSURE PERCLOSE PROSTYLE (VASCULAR PRODUCTS) IMPLANT
COVER SWIFTLINK CONNECTOR (BAG) ×2 IMPLANT
DILATOR VESSEL 38 20CM 16FR (INTRODUCER) IMPLANT
GUIDEWIRE INQWIRE 1.5J.035X260 (WIRE) IMPLANT
KIT VERSACROSS CNCT FARADRIVE (KITS) IMPLANT
PACK EP LF (CUSTOM PROCEDURE TRAY) ×2 IMPLANT
PAD DEFIB RADIO PHYSIO CONN (PAD) ×2 IMPLANT
PATCH CARTO3 (PAD) IMPLANT
SHEATH FARADRIVE STEERABLE (SHEATH) IMPLANT
SHEATH PINNACLE 8F 10CM (SHEATH) IMPLANT
SHEATH PINNACLE 9F 10CM (SHEATH) IMPLANT
SHEATH PROBE COVER 6X72 (BAG) IMPLANT

## 2023-09-19 NOTE — Discharge Instructions (Signed)

## 2023-09-19 NOTE — Transfer of Care (Signed)
 Immediate Anesthesia Transfer of Care Note  Patient: Penny Reed  Procedure(s) Performed: ATRIAL FIBRILLATION ABLATION  Patient Location: PACU  Anesthesia Type:General  Level of Consciousness: awake, alert , and oriented  Airway & Oxygen Therapy: Patient Spontanous Breathing  Post-op Assessment: Report given to RN and Post -op Vital signs reviewed and stable  Post vital signs: Reviewed and stable  Last Vitals:  Vitals Value Taken Time  BP    Temp    Pulse    Resp    SpO2      Last Pain:  Vitals:   09/19/23 0805  TempSrc:   PainSc: 0-No pain         Complications: No notable events documented.

## 2023-09-19 NOTE — Anesthesia Postprocedure Evaluation (Signed)
 Anesthesia Post Note  Patient: Penny Reed  Procedure(s) Performed: ATRIAL FIBRILLATION ABLATION     Patient location during evaluation: PACU Anesthesia Type: General Level of consciousness: awake and alert Pain management: pain level controlled Vital Signs Assessment: post-procedure vital signs reviewed and stable Respiratory status: spontaneous breathing, nonlabored ventilation, respiratory function stable and patient connected to nasal cannula oxygen Cardiovascular status: blood pressure returned to baseline and stable Postop Assessment: no apparent nausea or vomiting Anesthetic complications: no  No notable events documented.  Last Vitals:  Vitals:   09/19/23 1200 09/19/23 1205  BP: (!) 124/54   Pulse: (!) 55 (!) 57  Resp: 11   Temp:    SpO2: 92% 94%    Last Pain:  Vitals:   09/19/23 1218  TempSrc:   PainSc: 2                  Willian Harrow

## 2023-09-19 NOTE — Anesthesia Preprocedure Evaluation (Addendum)
 Anesthesia Evaluation  Patient identified by MRN, date of birth, ID band Patient awake    Reviewed: Allergy & Precautions, NPO status , Patient's Chart, lab work & pertinent test results  Airway Mallampati: I  TM Distance: >3 FB Neck ROM: Full    Dental  (+) Teeth Intact, Dental Advisory Given   Pulmonary sleep apnea , COPD, former smoker   Pulmonary exam normal        Cardiovascular hypertension, + angina  + CAD and + Peripheral Vascular Disease  Normal cardiovascular exam  Echo:   1. Left ventricular ejection fraction, by estimation, is 55 to 60%. The  left ventricle has normal function. The left ventricle demonstrates  regional wall motion abnormalities (focal region of severe hypokinesis  with thinning of the myocardium in the mid   septal wall). There is moderate asymmetric left ventricular hypertrophy  of the basal-septal segment. Left ventricular diastolic parameters are  indeterminate. The average left ventricular global longitudinal strain is  -20.8 %.   2. Right ventricular systolic function is normal. The right ventricular  size is normal. Tricuspid regurgitation signal is inadequate for assessing  PA pressure.   3. Left atrial size was severely dilated.   4. The mitral valve is normal in structure. Mild to moderate mitral valve  regurgitation. No evidence of mitral stenosis. Moderate mitral annular  calcification.   5. The aortic valve has an indeterminant number of cusps. Aortic valve  regurgitation is moderate. Aortic valve sclerosis is present, with no  evidence of aortic valve stenosis.   6. There is mild dilatation of the ascending aorta, measuring 42 mm.   7. The inferior vena cava is normal in size with greater than 50%  respiratory variability, suggesting right atrial pressure of 3 mmHg.     Neuro/Psych negative neurological ROS  negative psych ROS   GI/Hepatic Neg liver ROS,GERD  ,,  Endo/Other   Hypothyroidism    Renal/GU negative Renal ROS     Musculoskeletal negative musculoskeletal ROS (+)    Abdominal   Peds  Hematology negative hematology ROS (+)   Anesthesia Other Findings   Reproductive/Obstetrics                             Anesthesia Physical Anesthesia Plan  ASA: 3  Anesthesia Plan: General   Post-op Pain Management: Minimal or no pain anticipated   Induction: Intravenous  PONV Risk Score and Plan: 3 and Ondansetron  and Treatment may vary due to age or medical condition  Airway Management Planned: Oral ETT  Additional Equipment: None  Intra-op Plan:   Post-operative Plan: Extubation in OR  Informed Consent: I have reviewed the patients History and Physical, chart, labs and discussed the procedure including the risks, benefits and alternatives for the proposed anesthesia with the patient or authorized representative who has indicated his/her understanding and acceptance.     Dental advisory given  Plan Discussed with: CRNA  Anesthesia Plan Comments:        Anesthesia Quick Evaluation

## 2023-09-19 NOTE — H&P (Signed)
 Electrophysiology Office Follow up Visit Note:     Date:  09/19/2023    ID:  Penny Reed, DOB 1946/04/02, MRN [id number]   PCP:  Nikki Barters, MD  [facility] Cardiologist:  Antonette Batters, MD  [facility] Electrophysiologist:  Boyce Byes, MD      Interval History:       Penny Reed is a 78 y.o. female who presents for a follow up visit.    I last saw the patient Sep 01, 2021 for her persistent atrial fibrillation.  She had a prior PVI and CTI ablation May 31, 2021.  She did require cardioversion after the procedure.  She ultimately experienced recurrence of atrial fibrillation with scheduled for redo catheter ablation on May 19, 2023.  She send noted on April 21, 2023 saying that she wanted to cancel the ablation because she had not had a recurrence of arrhythmia.   June 18, 2023 she presented to the emergency department with atrial fibrillation.  In the emergency department she received a cardioversion.   She is on Xarelto  for stroke prophylaxis   She is with her husband today in clinic who I have previously met.  She reports not tolerating higher doses of amiodarone .  She is currently taking 100 mg by mouth once daily.  She takes Xarelto  for stroke prophylaxis without missed doses.  Presents today for an AF ablation. Procedure reviewed.     Objective Past medical, surgical, social and family history were reviewed.   ROS:   Please see the history of present illness.    All other systems reviewed and are negative.   EKGs/Labs/Other Studies Reviewed:     The following studies were reviewed today:   Over 24th 2025 EKG reviewed shows tachycardia with a ventricular rate of 142 bpm.  Appears consistent with atrial flutter.   March 16, 2023 EKG shows sinus bradycardia, PVC, incomplete left bundle branch block EKG Interpretation Date/Time:                  Wednesday July 19 2023 15:21:07 EDT Ventricular Rate:         48 PR  Interval:                 214 QRS Duration:             98 QT Interval:                 494 QTC Calculation:441 R Axis:                         27   Text Interpretation:Sinus bradycardia with 1st degree A-V block Confirmed by Harvie Liner [phone] on 07/19/2023 3:38:59 PM     Physical Exam:     VS:  BP 142/62   Pulse 45   Ht 5\' 7"  (1.702 m)   Wt 178 lb (80.7 kg)   SpO2 97%   BMI 27.88 kg/m         Wt Readings from Last 3 Encounters:  07/19/23 178 lb (80.7 kg)  06/18/23 175 lb (79.4 kg)  04/13/23 175 lb (79.4 kg)      GEN: no distress CARD: RRR, No MRG RESP: No IWOB. CTAB.     Assessment ASSESSMENT:     1. Persistent atrial fibrillation (HCC)   2. Atypical atrial flutter (HCC)   3. Encounter for long-term (current) use of high-risk medication     PLAN:  In order of problems listed above:   #Persistent atrial fibrillation and flutter #High risk med monitoring-amiodarone  Prior catheter ablation May 31, 2021 during which the pulmonary veins, posterior wall and CTI were ablated.  She also had ablation of a perimitral atrial flutter.  She is experienced symptomatic recurrence.  She was scheduled originally for redo catheter ablation earlier this year but canceled given she had good control of her symptoms.   Continue Xarelto  for stroke prophylaxis   I discussed treatment strategies for her recurrent atrial arrhythmias during clinic today.  We discussed antiarrhythmic drugs and catheter ablation.  She is currently on amiodarone  100 mg by mouth once daily.   Discussed treatment options today for AF including antiarrhythmic drug therapy and ablation. Discussed risks, recovery and likelihood of success with each treatment strategy. Risk, benefits, and alternatives to EP study and ablation for afib were discussed. These risks include but are not limited to stroke, bleeding, vascular damage, tamponade, perforation, damage to the esophagus, lungs, phrenic nerve and other  structures, pulmonary vein stenosis, worsening renal function, coronary vasospasm and death.  Discussed potential need for repeat ablation procedures and antiarrhythmic drugs after an initial ablation. The patient understands these risk and wishes to proceed.  We will therefore proceed with catheter ablation at the next available time.  Carto, ICE, anesthesia are requested for the procedure.  Will also obtain CT PV protocol prior to the procedure to exclude LAA thrombus and further evaluate atrial anatomy.    Presents for AF ablation today. Procedure reviewed.       Signed, Harvie Liner, MD, [facility], [facility] 09/19/2023 Electrophysiology [facility]

## 2023-09-19 NOTE — Anesthesia Procedure Notes (Signed)
 Procedure Name: Intubation Date/Time: 09/19/2023 9:46 AM  Performed by: Artemisa Bile D, CRNAPre-anesthesia Checklist: Patient identified, Emergency Drugs available, Suction available and Patient being monitored Patient Re-evaluated:Patient Re-evaluated prior to induction Oxygen Delivery Method: Circle System Utilized Preoxygenation: Pre-oxygenation with 100% oxygen Induction Type: IV induction Ventilation: Mask ventilation without difficulty Laryngoscope Size: Mac and 3 Grade View: Grade I Tube type: Oral Tube size: 7.0 mm Number of attempts: 1 Airway Equipment and Method: Stylet and Oral airway Placement Confirmation: ETT inserted through vocal cords under direct vision, positive ETCO2 and breath sounds checked- equal and bilateral Secured at: 21 cm Tube secured with: Tape Dental Injury: Teeth and Oropharynx as per pre-operative assessment

## 2023-09-20 ENCOUNTER — Encounter (HOSPITAL_COMMUNITY): Payer: Self-pay | Admitting: Cardiology

## 2023-09-20 ENCOUNTER — Telehealth (HOSPITAL_COMMUNITY): Payer: Self-pay

## 2023-09-20 NOTE — Telephone Encounter (Signed)
 Spoke with patient to complete post procedure follow up call.  Patient reports no complications with groin sites.   Instructions reviewed with patient:  Remove large bandage at puncture site after 24 hours. It is normal to have bruising, tenderness, mild swelling, and a pea or marble sized lump/knot at the groin site which can take up to three months to resolve.  Get help right away if you notice sudden swelling at the puncture site.  Check your puncture site every day for signs of infection: fever, redness, swelling, pus drainage, warmth, foul odor or excessive pain. If this occurs, please call the office at [phone], to speak with the nurse. Get help right away if your puncture site is bleeding and the bleeding does not stop after applying firm pressure to the area.  You may continue to have skipped beats/ atrial fibrillation during the first several months after your procedure.  It is very important not to miss any doses of your blood thinner Xarelto . Patient restarted taking this medication on 09/19/23   You will follow up with the APP on 10/17/23 the APP on 12/21/23.   Patient verbalized understanding to all instructions provided.

## 2023-09-21 ENCOUNTER — Encounter: Payer: Self-pay | Admitting: Emergency Medicine

## 2023-09-21 MED FILL — Fentanyl Citrate Preservative Free (PF) Inj 100 MCG/2ML: INTRAMUSCULAR | Qty: 2 | Status: AC

## 2023-10-16 ENCOUNTER — Other Ambulatory Visit (INDEPENDENT_AMBULATORY_CARE_PROVIDER_SITE_OTHER): Payer: Self-pay | Admitting: Nurse Practitioner

## 2023-10-16 DIAGNOSIS — I739 Peripheral vascular disease, unspecified: Secondary | ICD-10-CM

## 2023-10-17 ENCOUNTER — Ambulatory Visit (INDEPENDENT_AMBULATORY_CARE_PROVIDER_SITE_OTHER): Payer: Medicare HMO

## 2023-10-17 ENCOUNTER — Ambulatory Visit (INDEPENDENT_AMBULATORY_CARE_PROVIDER_SITE_OTHER): Payer: Medicare HMO | Admitting: Vascular Surgery

## 2023-10-17 ENCOUNTER — Encounter (INDEPENDENT_AMBULATORY_CARE_PROVIDER_SITE_OTHER): Payer: Self-pay | Admitting: Vascular Surgery

## 2023-10-17 ENCOUNTER — Encounter: Payer: Self-pay | Admitting: Cardiology

## 2023-10-17 ENCOUNTER — Ambulatory Visit: Attending: Cardiology | Admitting: Cardiology

## 2023-10-17 VITALS — BP 150/63 | HR 52 | Ht 67.0 in | Wt 178.1 lb

## 2023-10-17 VITALS — BP 102/50 | HR 53 | Ht 67.0 in | Wt 178.2 lb

## 2023-10-17 DIAGNOSIS — I70213 Atherosclerosis of native arteries of extremities with intermittent claudication, bilateral legs: Secondary | ICD-10-CM

## 2023-10-17 DIAGNOSIS — I739 Peripheral vascular disease, unspecified: Secondary | ICD-10-CM

## 2023-10-17 DIAGNOSIS — I10 Essential (primary) hypertension: Secondary | ICD-10-CM

## 2023-10-17 DIAGNOSIS — I484 Atypical atrial flutter: Secondary | ICD-10-CM | POA: Diagnosis not present

## 2023-10-17 DIAGNOSIS — I4819 Other persistent atrial fibrillation: Secondary | ICD-10-CM

## 2023-10-17 DIAGNOSIS — D6869 Other thrombophilia: Secondary | ICD-10-CM

## 2023-10-17 DIAGNOSIS — E7800 Pure hypercholesterolemia, unspecified: Secondary | ICD-10-CM

## 2023-10-17 DIAGNOSIS — Z79899 Other long term (current) drug therapy: Secondary | ICD-10-CM | POA: Diagnosis not present

## 2023-10-17 DIAGNOSIS — I70219 Atherosclerosis of native arteries of extremities with intermittent claudication, unspecified extremity: Secondary | ICD-10-CM | POA: Insufficient documentation

## 2023-10-17 NOTE — Progress Notes (Addendum)
 Electrophysiology Clinic Note    Date:  10/17/2023  Patient ID:  Reed, Penny Dec 23, 1945, MRN [id number] PCP:  Bertrum Charlie CROME, MD  Cardiologist:  Cara JONETTA Lovelace, MD Electrophysiologist: OLE ONEIDA HOLTS, MD   Discussed the use of AI scribe software for clinical note transcription with the patient, who gave verbal consent to proceed.   Patient Profile    Chief Complaint: AF ablation follow-up  History of Present Illness: Penny Reed is a 78 y.o. female with PMH notable for persis AFib, atypical aflutter, CAD s/p CABG, COPD, HTN, PAD, h/o DVT, dilated ascending aorta; seen today for OLE ONEIDA HOLTS, MD for routine electrophysiology follow-up s/p Ablation.  She is s/p AF ablation with isolation of pulm veins, posterior wall, and CTI and perimitral line for flutter on 05/2021 by Dr. HOLTS.  She had recurrence of AFib and is now s/p redo AF ablation with re-isolation of PVI, redo posterior wall on 09/19/2023.   On follow-up today, she has not had any AF episodes that she is aware of.  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  She has not needed any as needed metoprolol .  She continues to take amiodarone  100 mg daily.  Continues to take Xarelto  daily without any bleeding concerns.  She has not followed up with PCP for thyroid  mgmt since last labs.       Arrhythmia/Device History Amiodarone     ROS:  Please see the history of present illness. All other systems are reviewed and otherwise negative.    Physical Exam    VS:  BP (!) 102/50 (BP Location: Right Arm, Patient Position: Sitting, Cuff Size: Normal)   Pulse (!) 53   Ht 5' 7 (1.702 m)   Wt 178 lb 4 oz (80.9 kg)   SpO2 98%   BMI 27.92 kg/m  BMI: Body mass index is 27.92 kg/m.      Wt Readings from Last 3 Encounters:  10/17/23 178 lb 4 oz (80.9 kg)  10/17/23 178 lb 2 oz  (80.8 kg)  09/19/23 176 lb (79.8 kg)     GEN- The patient is well appearing, alert and oriented x 3 today.   Lungs- Clear to ausculation bilaterally, normal work of breathing.  Heart- Regular, bradycardic rate and rhythm, no murmurs, rubs or gallops Extremities- Trace peripheral edema, warm, dry   Studies Reviewed   Previous EP, cardiology notes.    EKG is ordered. Personal review of EKG from today shows:     EKG Interpretation Date/Time:  Tuesday October 17 2023 12:58:41 EDT Ventricular Rate:  53 PR Interval:    QRS Duration:  96 QT Interval:  508 QTC Calculation: 476 R Axis:   -16  Text Interpretation: Junctional rhythm with occasional Premature ventricular complexes Confirmed by Damiel Barthold [phone]) on 10/17/2023 1:20:17 PM     TTE, 04/11/2023  1. Left ventricular ejection fraction, by estimation, is 55 to 60%. The left ventricle has normal function. The left ventricle demonstrates regional wall motion abnormalities (focal region of severe hypokinesis with thinning of the myocardium in the mid  septal wall). There is moderate asymmetric left ventricular hypertrophy of the basal-septal segment. Left ventricular diastolic parameters are indeterminate. The average left ventricular global longitudinal strain is -20.8 %.   2. Right ventricular systolic function is normal. The right ventricular size is normal. Tricuspid regurgitation signal is inadequate for assessing  PA pressure.   3. Left atrial size was severely dilated.   4. The mitral valve is normal in structure. Mild to moderate mitral valve regurgitation. No evidence of mitral stenosis. Moderate mitral annular calcification.   5. The aortic valve has an indeterminant number of cusps. Aortic valve regurgitation is moderate. Aortic valve sclerosis is present, with no evidence of aortic valve stenosis.   6. There is mild dilatation of the ascending aorta, measuring 42 mm.   7. The inferior vena cava is normal in size with greater  than 50% respiratory variability, suggesting right atrial pressure of 3 mmHg.     Assessment and Plan     #) atypical atrial flutter #) persis AFib S/p AFib, aflutter ablation 2023 S/p redo afib ablation 08/2023 No recent A-fib, a flutter episodes since most recent ablation She is in junctional rhythm today with occasional PVCs, asymptomatic Stop amiodarone  today   #) Hypercoag d/t persis afib CHA2DS2-VASc Score = at least 6 [CHF History: 1, HTN History: 1, Diabetes History: 0, Stroke History: 0, Vascular Disease History: 1, Age Score: 2, Gender Score: 1].  Therefore, the patient's annual risk of stroke is 9.7 %.    Stroke ppx - 20mg  xarelto , appropriately dosed No bleeding concerns   #) hypothyroid Recommended she follow-up with her primary care for further thyroid  management       Current medicines are reviewed at length with the patient today.   The patient does not have concerns regarding her medicines.  The following changes were made today:   STOP amiodarone   Labs/ tests ordered today include:  Orders Placed This Encounter  Procedures   EKG 12-Lead     Disposition: Follow up with Dr. Cindie or EP APP in 2 months    Signed, Tecumseh Yeagley, NP  10/17/23  1:23 PM  Electrophysiology [facility]

## 2023-10-17 NOTE — Assessment & Plan Note (Signed)
 Her ABIs today are stable at 0.88 on the right and 0.71 on the left.  She has mild claudication symptoms that are not currently lifestyle limiting.  We discussed intervention versus continued medical therapy and conservative measures with activity.  She would prefer the latter and I would agree with this as she does not have any limb threatening or life-threatening symptoms.  She will continue her Xarelto  and statin agent.  Follow-up in 6 months with noninvasive studies and if she is stable at that time we will likely go to an annual basis.

## 2023-10-17 NOTE — Progress Notes (Signed)
 MRN : [id number]  Penny Reed is a 78 y.o. ([birth year]) female who presents with chief complaint of  Chief Complaint  Patient presents with   F/u 6 months  ABI & Bilateral Duplex  .  History of Present Illness: Patient returns today in follow up of her claudication symptoms.  She can walk about a mile before she has to stop if she is on level ground.  She does not have any ischemic rest pain, ulceration, or infection.  She has been very diligent and active and continues to workout on a nearly daily basis.  She is on Xarelto  and a statin agent.  Her ABIs today are stable at 0.88 on the right and 0.71 on the left.  Current Outpatient Medications  Medication Sig Dispense Refill   albuterol  (PROAIR  HFA) 108 (90 BASE) MCG/ACT inhaler Inhale 2 puffs into the lungs every 6 (six) hours as needed for wheezing or shortness of breath. 3 Inhaler 3   amLODipine  (NORVASC ) 5 MG tablet Take 1 tablet (5 mg total) by mouth daily. 90 tablet 3   atorvastatin  (LIPITOR) 80 MG tablet TAKE 1 TABLET BY MOUTH EVERY DAY 90 tablet 3   Azelastine  HCl 137 MCG/SPRAY SOLN SPRAY 2 SPRAYS INTO EACH NOSTRIL EVERY DAY 30 mL 3   benazepril  (LOTENSIN ) 20 MG tablet Take 20 mg by mouth daily.     empagliflozin (JARDIANCE) 10 MG TABS tablet Take 10 mg by mouth daily.     levothyroxine  (SYNTHROID ) 112 MCG tablet TAKE 1 TABLET BY MOUTH SIX DAYS PER WEEK. TAKE 1 AND 1/2 TABLET BY MOUTH ONE DAY PER WEEK (WEDNESDAYS) 100 tablet 2   metoprolol  succinate (TOPROL -XL) 25 MG 24 hr tablet Take 25 mg by mouth daily as needed (afib).     Multiple Vitamin (MULTIVITAMIN) tablet Take 1 tablet by mouth daily. Standard Process     nitroGLYCERIN (NITROSTAT) 0.4 MG SL tablet Place 0.4 mg under the tongue every 5 (five) minutes as needed for chest pain.      pantoprazole  (PROTONIX ) 40 MG tablet Take 1 tablet (40 mg total) by mouth daily. No refill needed, post procedure medication. 45 tablet 0   torsemide (DEMADEX) 20 MG tablet Take 20 mg by  mouth daily.     umeclidinium-vilanterol (ANORO ELLIPTA ) 62.5-25 MCG/ACT AEPB INHALE 1 PUFF BY MOUTH EVERY DAY 60 each 7   XARELTO  20 MG TABS tablet TAKE 1 TABLET BY MOUTH DAILY WITH SUPPER. STOP ELIQUIS  90 tablet 1   zolpidem  (AMBIEN ) 5 MG tablet Take 1 tablet (5 mg total) by mouth at bedtime as needed. for sleep 30 tablet 3   No current facility-administered medications for this visit.    Past Medical History:  Diagnosis Date   Allergic rhinitis    Atrial fibrillation (HCC)    cardioverted 12/24   CAD (coronary artery disease)    Complication of anesthesia    woke up during colonoscopy   COPD (chronic obstructive pulmonary disease) (HCC)    Hyperlipidemia    Hypertension    Hypothyroidism    LVH (left ventricular hypertrophy)    Peripheral vascular disease (HCC)    Sleep apnea     Past Surgical History:  Procedure Laterality Date   ABDOMINAL HYSTERECTOMY     has left ovary   APPENDECTOMY     ATRIAL FIBRILLATION ABLATION N/A 05/31/2021   Procedure: ATRIAL FIBRILLATION ABLATION;  Surgeon: Cindie Ole DASEN, MD;  Location: [facility] INVASIVE CV LAB;  Service: Cardiovascular;  Laterality: N/A;  ATRIAL FIBRILLATION ABLATION N/A 09/19/2023   Procedure: ATRIAL FIBRILLATION ABLATION;  Surgeon: Cindie Ole DASEN, MD;  Location: [facility] INVASIVE CV LAB;  Service: Cardiovascular;  Laterality: N/A;   CARDIAC CATHETERIZATION  09/2005   had 1 stent and 3 balloons placed   CARDIOVERSION N/A 04/02/2021   Procedure: CARDIOVERSION;  Surgeon: Hobart Powell BRAVO, MD;  Location[facility] ENDOSCOPY;  Service: Cardiovascular;  Laterality: N/A;   CARDIOVERSION N/A 07/28/2021   Procedure: CARDIOVERSION;  Surgeon: Alvan Ronal BRAVO, MD;  Location: [facility] ENDOSCOPY;  Service: Cardiovascular;  Laterality: N/A;   COLONOSCOPY WITH PROPOFOL  N/A 12/04/2019   Procedure: COLONOSCOPY WITH PROPOFOL ;  Surgeon: Dessa Reyes ORN, MD;  Location: [facility] ENDOSCOPY;  Service: Endoscopy;  Laterality: N/A;   CORONARY ARTERY BYPASS GRAFT      SVT ABLATION N/A 09/04/2020   Procedure: SVT ABLATION;  Surgeon: Cindie Ole DASEN, MD;  Location: [facility] INVASIVE CV LAB;  Service: Cardiovascular;  Laterality: N/A;   TONSILLECTOMY       Social History   Tobacco Use   Smoking status: Former    Current packs/day: 0.00    Average packs/day: 0.3 packs/day for 25.0 years (6.3 ttl pk-yrs)    Types: Cigarettes    Start date: 04/25/1980    Quit date: 04/25/2005    Years since quitting: 18.4   Smokeless tobacco: Never   Tobacco comments:    Former smoker 06/28/21  Vaping Use   Vaping status: Never Used  Substance Use Topics   Alcohol use: Yes    Alcohol/week: 6.0 - 14.0 standard drinks of alcohol    Types: 6 - 14 Glasses of wine per week    Comment: 2 glasses in a setting over 3-7 days a week 06/28/21   Drug use: No       Family History  Problem Relation Age of Onset   Diabetes Mother    Stroke Mother        cause of death   Coronary artery disease Father    Heart attack Father    Hyperlipidemia Sister    Hypertension Sister    Breast cancer Sister 65   Cancer Sister        breast cancer   Asthma Sister    Scleroderma Sister    Thyroid  disease Sister    Heart attack Brother    Cancer - Colon Paternal Grandmother    Breast cancer Maternal Aunt        61s     Allergies  Allergen Reactions   Amoxicillin Nausea Only   Adhesive [Tape] Other (See Comments)    Band-aids irritate skin   Azithromycin  Diarrhea and Nausea And Vomiting   Eliquis  [Apixaban ] Swelling    Leg and ankle swelling     REVIEW OF SYSTEMS (Negative unless checked)  Constitutional: [] Weight loss  [] Fever  [] Chills Cardiac: [] Chest pain   [] Chest pressure   [] Palpitations   [] Shortness of breath when laying flat   [] Shortness of breath at rest   [] Shortness of breath with exertion. Vascular:  [x] Pain in legs with walking   [] Pain in legs at rest   [] Pain in legs when laying flat   [x] Claudication   [] Pain in feet when walking  [] Pain in feet at rest   [] Pain in feet when laying flat   [] History of DVT   [] Phlebitis   [] Swelling in legs   [] Varicose veins   [] Non-healing ulcers Pulmonary:   [] Uses home oxygen   [] Productive cough   [] Hemoptysis   [] Wheeze  [x] COPD   []   Asthma Neurologic:  [] Dizziness  [] Blackouts   [] Seizures   [] History of stroke   [] History of TIA  [] Aphasia   [] Temporary blindness   [] Dysphagia   [] Weakness or numbness in arms   [] Weakness or numbness in legs Musculoskeletal:  [] Arthritis   [] Joint swelling   [x] Joint pain   [] Low back pain Hematologic:  [] Easy bruising  [] Easy bleeding   [] Hypercoagulable state   [] Anemic   Gastrointestinal:  [] Blood in stool   [] Vomiting blood  [] Gastroesophageal reflux/heartburn   [] Abdominal pain Genitourinary:  [] Chronic kidney disease   [] Difficult urination  [] Frequent urination  [] Burning with urination   [] Hematuria Skin:  [] Rashes   [] Ulcers   [] Wounds Psychological:  [] History of anxiety   []  History of major depression.  Physical Examination  BP (!) 150/63   Pulse (!) 52   Ht 5' 7 (1.702 m)   Wt 178 lb 2 oz (80.8 kg)   BMI 27.90 kg/m  Gen:  WD/WN, NAD. Appears younger than stated age Head: [place]/AT, No temporalis wasting. Ear/Nose/Throat: Hearing grossly intact, nares w/o erythema or drainage Eyes: Conjunctiva clear. Sclera non-icteric Neck: Supple.  Trachea midline Pulmonary:  Good air movement, no use of accessory muscles.  Cardiac: bradycardic Vascular:  Vessel Right Left  Radial Palpable Palpable                          PT 1+ Palpable Not Palpable  DP 2+ Palpable 1+ Palpable   Gastrointestinal: soft, non-tender/non-distended. No guarding/reflex.  Musculoskeletal: M/S 5/5 throughout.  No deformity or atrophy. No edema. Neurologic: Sensation grossly intact in extremities.  Symmetrical.  Speech is fluent.  Psychiatric: Judgment intact, Mood & affect appropriate for pt's clinical situation. Dermatologic: No rashes or ulcers noted.  No cellulitis or open  wounds.      Labs Recent Results (from the past 2160 hours)  Comprehensive metabolic panel     Status: Abnormal   Collection Time: 09/08/23 10:58 AM  Result Value Ref Range   Sodium 136 135 - 145 mmol/L   Potassium 3.7 3.5 - 5.1 mmol/L   Chloride 96 (L) 98 - 111 mmol/L   CO2 27 22 - 32 mmol/L   Glucose, Bld 93 70 - 99 mg/dL    Comment: Glucose reference range applies only to samples taken after fasting for at least 8 hours.   BUN 33 (H) 8 - 23 mg/dL   Creatinine, Ser 8.37 (H) 0.44 - 1.00 mg/dL   Calcium  9.4 8.9 - 10.3 mg/dL   Total Protein 7.0 6.5 - 8.1 g/dL   Albumin 4.2 3.5 - 5.0 g/dL   AST 37 15 - 41 U/L   ALT 39 0 - 44 U/L   Alkaline Phosphatase 76 38 - 126 U/L   Total Bilirubin 1.1 0.0 - 1.2 mg/dL   GFR, Estimated 33 (L) >60 mL/min    Comment: (NOTE) Calculated using the CKD-EPI Creatinine Equation (2021)    Anion gap 13 5 - 15    Comment: Performed at [facility], [address] [place], [place] [address]  CBC     Status: Abnormal   Collection Time: 09/08/23 10:58 AM  Result Value Ref Range   WBC 6.2 4.0 - 10.5 K/uL   RBC 3.80 (L) 3.87 - 5.11 MIL/uL   Hemoglobin 12.6 12.0 - 15.0 g/dL   HCT 62.7 63.9 - 53.9 %   MCV 97.9 80.0 - 100.0 fL   MCH 33.2 26.0 - 34.0 pg  MCHC 33.9 30.0 - 36.0 g/dL   RDW 86.7 88.4 - 84.4 %   Platelets 233 150 - 400 K/uL   nRBC 0.0 0.0 - 0.2 %    Comment: Performed at [facility], [address] [place], [place] [address]  TSH     Status: Abnormal   Collection Time: 09/08/23 10:58 AM  Result Value Ref Range   TSH 9.226 (H) 0.350 - 4.500 uIU/mL    Comment: Performed by a 3rd Generation assay with a functional sensitivity of <=0.01 uIU/mL. Performed at [facility], [address] [place], [place] [address]   T4, free     Status: None   Collection Time: 09/08/23 10:58 AM  Result Value Ref Range   Free T4 1.06 0.61 - 1.12 ng/dL    Comment: (NOTE) Biotin ingestion may interfere with free T4 tests. If  the results are inconsistent with the TSH level, previous test results, or the clinical presentation, then consider biotin interference. If needed, order repeat testing after stopping biotin. Performed at [facility], [address] [place], [place] [address]   Basic metabolic panel     Status: Abnormal   Collection Time: 09/19/23  8:04 AM  Result Value Ref Range   Sodium 139 135 - 145 mmol/L   Potassium 3.8 3.5 - 5.1 mmol/L   Chloride 103 98 - 111 mmol/L   CO2 26 22 - 32 mmol/L   Glucose, Bld 105 (H) 70 - 99 mg/dL    Comment: Glucose reference range applies only to samples taken after fasting for at least 8 hours.   BUN 17 8 - 23 mg/dL   Creatinine, Ser 9.10 0.44 - 1.00 mg/dL   Calcium  9.2 8.9 - 10.3 mg/dL   GFR, Estimated >39 >39 mL/min    Comment: (NOTE) Calculated using the CKD-EPI Creatinine Equation (2021)    Anion gap 10 5 - 15    Comment: Performed at [facility], [address] [place], [place] [address]  POCT Activated clotting time     Status: None   Collection Time: 09/19/23 10:37 AM  Result Value Ref Range   Activated Clotting Time 366 seconds    Comment: Reference range 74-137 seconds for patients not on anticoagulant therapy.    Radiology EP STUDY Result Date: 09/19/2023 CONCLUSIONS: 1. Successful redo PVI 2. Successful redo ablation/isolation of the posterior wall 3. Intracardiac echo reveals trivial pericardial effusion, normal LA architecture 4. No early apparent complications. 5. Colchicine  0.6mg  PO BID x 5 days 6. Protonix  40mg  PO daily x 45 days 7. If recurrence of AF/AFL, recommend medical therapy.    Assessment/Plan  Atherosclerosis of native arteries of extremity with intermittent claudication (HCC) Her ABIs today are stable at 0.88 on the right and 0.71 on the left.  She has mild claudication symptoms that are not currently lifestyle limiting.  We discussed intervention versus continued medical therapy and conservative measures with  activity.  She would prefer the latter and I would agree with this as she does not have any limb threatening or life-threatening symptoms.  She will continue her Xarelto  and statin agent.  Follow-up in 6 months with noninvasive studies and if she is stable at that time we will likely go to an annual basis.  Essential (primary) hypertension blood pressure control important in reducing the progression of atherosclerotic disease. On appropriate oral medications.   HLD (hyperlipidemia) lipid control important in reducing the progression of atherosclerotic disease. Continue statin therapy  Selinda Gu, MD  10/17/2023 10:32 AM    This note was created with Dragon medical transcription system.  Any errors from dictation are purely unintentional

## 2023-10-17 NOTE — Assessment & Plan Note (Signed)
 lipid control important in reducing the progression of atherosclerotic disease. Continue statin therapy

## 2023-10-17 NOTE — Assessment & Plan Note (Signed)
 blood pressure control important in reducing the progression of atherosclerotic disease. On appropriate oral medications.

## 2023-10-17 NOTE — Patient Instructions (Addendum)
 Medication Instructions:  Your physician has recommended you make the following change in your medication:   STOP Amiodarone   *If you need a refill on your cardiac medications before your next appointment, please call your pharmacy*  Lab Work: None  If you have labs (blood work) drawn today and your tests are completely normal, you will receive your results only by: MyChart Message (if you have MyChart) OR A paper copy in the mail If you have any lab test that is abnormal or we need to change your treatment, we will call you to review the results.  Testing/Procedures: None  Follow-Up: At [facility], you and your health needs are our priority.  As part of our continuing mission to provide you with exceptional heart care, our providers are all part of one team.  This team includes your primary Cardiologist (physician) and Advanced Practice Providers or APPs (Physician Assistants and Nurse Practitioners) who all work together to provide you with the care you need, when you need it.  Your next appointment:   12/21/23 at 1:00 pm   Provider:   Suzann Riddle, NP

## 2023-10-18 DIAGNOSIS — I480 Paroxysmal atrial fibrillation: Secondary | ICD-10-CM | POA: Diagnosis not present

## 2023-10-18 DIAGNOSIS — R000 Tachycardia, unspecified: Secondary | ICD-10-CM | POA: Diagnosis not present

## 2023-10-18 DIAGNOSIS — Z951 Presence of aortocoronary bypass graft: Secondary | ICD-10-CM | POA: Diagnosis not present

## 2023-10-18 DIAGNOSIS — I10 Essential (primary) hypertension: Secondary | ICD-10-CM | POA: Diagnosis not present

## 2023-10-18 DIAGNOSIS — I5032 Chronic diastolic (congestive) heart failure: Secondary | ICD-10-CM | POA: Diagnosis not present

## 2023-10-18 DIAGNOSIS — I2510 Atherosclerotic heart disease of native coronary artery without angina pectoris: Secondary | ICD-10-CM | POA: Diagnosis not present

## 2023-10-18 DIAGNOSIS — M25473 Effusion, unspecified ankle: Secondary | ICD-10-CM | POA: Diagnosis not present

## 2023-10-18 DIAGNOSIS — Z87891 Personal history of nicotine dependence: Secondary | ICD-10-CM | POA: Diagnosis not present

## 2023-10-18 DIAGNOSIS — I739 Peripheral vascular disease, unspecified: Secondary | ICD-10-CM | POA: Diagnosis not present

## 2023-10-18 DIAGNOSIS — K219 Gastro-esophageal reflux disease without esophagitis: Secondary | ICD-10-CM | POA: Diagnosis not present

## 2023-10-18 DIAGNOSIS — J449 Chronic obstructive pulmonary disease, unspecified: Secondary | ICD-10-CM | POA: Diagnosis not present

## 2023-10-18 DIAGNOSIS — R001 Bradycardia, unspecified: Secondary | ICD-10-CM | POA: Diagnosis not present

## 2023-10-18 LAB — VAS US ABI WITH/WO TBI
Left ABI: 0.71
Right ABI: 0.88

## 2023-10-24 DIAGNOSIS — M25512 Pain in left shoulder: Secondary | ICD-10-CM | POA: Diagnosis not present

## 2023-10-24 DIAGNOSIS — S46012A Strain of muscle(s) and tendon(s) of the rotator cuff of left shoulder, initial encounter: Secondary | ICD-10-CM | POA: Diagnosis not present

## 2023-10-24 DIAGNOSIS — W010XXA Fall on same level from slipping, tripping and stumbling without subsequent striking against object, initial encounter: Secondary | ICD-10-CM | POA: Diagnosis not present

## 2023-10-24 DIAGNOSIS — I482 Chronic atrial fibrillation, unspecified: Secondary | ICD-10-CM | POA: Diagnosis not present

## 2023-10-25 ENCOUNTER — Other Ambulatory Visit: Payer: Self-pay | Admitting: Orthopedic Surgery

## 2023-10-25 DIAGNOSIS — S46012A Strain of muscle(s) and tendon(s) of the rotator cuff of left shoulder, initial encounter: Secondary | ICD-10-CM

## 2023-10-29 ENCOUNTER — Ambulatory Visit
Admission: RE | Admit: 2023-10-29 | Discharge: 2023-10-29 | Disposition: A | Discharge status: Home / Self Care | Source: Ambulatory Visit | Attending: Orthopedic Surgery | Admitting: Orthopedic Surgery

## 2023-10-29 DIAGNOSIS — S46012A Strain of muscle(s) and tendon(s) of the rotator cuff of left shoulder, initial encounter: Secondary | ICD-10-CM | POA: Diagnosis not present

## 2023-10-29 DIAGNOSIS — M7582 Other shoulder lesions, left shoulder: Secondary | ICD-10-CM | POA: Diagnosis not present

## 2023-10-29 DIAGNOSIS — S46812A Strain of other muscles, fascia and tendons at shoulder and upper arm level, left arm, initial encounter: Secondary | ICD-10-CM | POA: Diagnosis not present

## 2023-10-29 DIAGNOSIS — S46212A Strain of muscle, fascia and tendon of other parts of biceps, left arm, initial encounter: Secondary | ICD-10-CM | POA: Diagnosis not present

## 2023-10-29 DIAGNOSIS — M75122 Complete rotator cuff tear or rupture of left shoulder, not specified as traumatic: Secondary | ICD-10-CM | POA: Diagnosis not present

## 2023-10-30 DIAGNOSIS — S46012A Strain of muscle(s) and tendon(s) of the rotator cuff of left shoulder, initial encounter: Secondary | ICD-10-CM | POA: Diagnosis not present

## 2023-10-31 ENCOUNTER — Other Ambulatory Visit: Payer: Self-pay | Admitting: Orthopedic Surgery

## 2023-11-01 ENCOUNTER — Encounter
Admission: RE | Admit: 2023-11-01 | Discharge: 2023-11-01 | Disposition: A | Discharge status: Home / Self Care | Source: Ambulatory Visit | Attending: Orthopedic Surgery | Admitting: Orthopedic Surgery

## 2023-11-01 ENCOUNTER — Encounter: Payer: Self-pay | Admitting: Orthopedic Surgery

## 2023-11-01 ENCOUNTER — Encounter
Admission: RE | Admit: 2023-11-01 | Discharge: 2023-11-01 | Disposition: A | Discharge status: Home / Self Care | Source: Ambulatory Visit | Attending: Orthopedic Surgery

## 2023-11-01 ENCOUNTER — Other Ambulatory Visit: Payer: Self-pay

## 2023-11-01 VITALS — Ht 67.0 in | Wt 175.0 lb

## 2023-11-01 DIAGNOSIS — Z01812 Encounter for preprocedural laboratory examination: Secondary | ICD-10-CM | POA: Diagnosis not present

## 2023-11-01 DIAGNOSIS — I10 Essential (primary) hypertension: Secondary | ICD-10-CM

## 2023-11-01 DIAGNOSIS — I4819 Other persistent atrial fibrillation: Secondary | ICD-10-CM

## 2023-11-01 HISTORY — DX: Hypothyroidism, unspecified: E03.9

## 2023-11-01 HISTORY — DX: Essential (primary) hypertension: I10

## 2023-11-01 HISTORY — DX: Aneurysm of the ascending aorta, without rupture: I71.21

## 2023-11-01 HISTORY — DX: Gastro-esophageal reflux disease without esophagitis: K21.9

## 2023-11-01 LAB — BASIC METABOLIC PANEL WITH GFR
Anion gap: 11 (ref 5–15)
BUN: 22 mg/dL (ref 8–23)
CO2: 24 mmol/L (ref 22–32)
Calcium: 9.3 mg/dL (ref 8.9–10.3)
Chloride: 104 mmol/L (ref 98–111)
Creatinine, Ser: 0.78 mg/dL (ref 0.44–1.00)
GFR, Estimated: >60 mL/min (ref >60)
Glucose, Bld: 111 mg/dL — ABNORMAL HIGH (ref 70–99)
Potassium: 3.5 mmol/L (ref 3.5–5.1)
Sodium: 139 mmol/L (ref 135–145)

## 2023-11-01 NOTE — Patient Instructions (Addendum)
 Your procedure is scheduled on: Friday, July 11 Report to the Registration Desk on the 1st floor of the [facility] To find out your arrival time, please call [phone] between 1PM - 3PM on: Thursday, July 10 If your arrival time is 6:00 am, do not arrive before that time as the [facility] entrance doors do not open until 6:00 am.  REMEMBER: Instructions that are not followed completely may result in serious medical risk, up to and including death; or upon the discretion of your surgeon and anesthesiologist your surgery may need to be rescheduled.  Do not eat food after midnight the night before surgery.  No gum chewing or hard candies.  You may however, drink CLEAR liquids up to 2 hours before you are scheduled to arrive for your surgery. Do not drink anything within 2 hours of your scheduled arrival time.  Clear liquids include: - water  - apple juice without pulp - gatorade (not RED colors) - black coffee or tea (Do NOT add milk or creamers to the coffee or tea) Do NOT drink anything that is not on this list.  In addition, your doctor has ordered for you to drink the provided:  Ensure Pre-Surgery Clear Carbohydrate Drink  Drinking this carbohydrate drink up to two hours before surgery helps to reduce insulin resistance and improve patient outcomes. Please complete drinking 2 hours before scheduled arrival time.  One week prior to surgery: Stop Anti-inflammatories (NSAIDS) such as Advil, Aleve, Ibuprofen, Motrin, Naproxen, Naprosyn and Aspirin based products such as Excedrin, Goody's Powder, BC Powder. Stop ANY OVER THE COUNTER supplements until after surgery.  You may however, continue to take Tylenol  if needed for pain up until the day of surgery.  Xarelto  - Hold 3 days before surgery per instructions from Dr. Tobie and Dr. Florencio; stated your last day taken was 10/31/2023. Resume AFTER surgery per surgeon instruction.  Jardiance - normally a 3 day hold before surgery. Do  NOT take any more Jardiance until AFTER surgery.  Continue taking all of your other prescription medications up until the day of surgery.  ON THE DAY OF SURGERY ONLY TAKE THESE MEDICATIONS WITH SIPS OF WATER:  Amlodipine  Atorvastatin  Levothyroxine  Anoro ellipta  inhaler  Use inhaler on the day of surgery and bring your albuterol  inhaler to the hospital.  No Alcohol for 24 hours before or after surgery.  No Smoking including e-cigarettes for 24 hours before surgery.  No chewable tobacco products for at least 6 hours before surgery.  No nicotine patches on the day of surgery.  Do not use any recreational drugs for at least a week (preferably 2 weeks) before your surgery.  Please be advised that the combination of cocaine and anesthesia may have negative outcomes, up to and including death. If you test positive for cocaine, your surgery will be cancelled.  On the morning of surgery brush your teeth with toothpaste and water, you may rinse your mouth with mouthwash if you wish. Do not swallow any toothpaste or mouthwash.  Use CHG Soap as directed on instruction sheet.  Do not wear jewelry, make-up, hairpins, clips or nail polish.  For welded (permanent) jewelry: bracelets, anklets, waist bands, etc.  Please have this removed prior to surgery.  If it is not removed, there is a chance that hospital personnel will need to cut it off on the day of surgery.  Do not wear lotions, powders, or perfumes. No deodorant.  Do not shave body hair from the neck down 48 hours  before surgery.  Contact lenses, hearing aids and dentures may not be worn into surgery.  Do not bring valuables to the hospital. [facility] is not responsible for any missing/lost belongings or valuables.   Notify your doctor if there is any change in your medical condition (cold, fever, infection).  Wear comfortable clothing (specific to your surgery type) to the hospital.  After surgery, you can help prevent lung  complications by doing breathing exercises.  Take deep breaths and cough every 1-2 hours. Your doctor may order a device called an Incentive Spirometer to help you take deep breaths.  If you are being discharged the day of surgery, you will not be allowed to drive home. You will need a responsible individual to drive you home and stay with you for 24 hours after surgery.   If you are taking public transportation, you will need to have a responsible individual with you.  Please call the Pre-admissions Testing Dept. at [phone] if you have any questions about these instructions.  Surgery Visitation Policy:  Patients having surgery or a procedure may have two visitors.  Children under the age of 66 must have an adult with them who is not the patient.   Merchandiser, retail to address health-related social needs:  [email or web address]     Preparing for Surgery with CHLORHEXIDINE GLUCONATE (CHG) Soap  Chlorhexidine Gluconate (CHG) Soap  o An antiseptic cleaner that kills germs and bonds with the skin to continue killing germs even after washing  o Used for showering the night before surgery and morning of surgery  Before surgery, you can play an important role by reducing the number of germs on your skin.  CHG (Chlorhexidine gluconate) soap is an antiseptic cleanser which kills germs and bonds with the skin to continue killing germs even after washing.  Please do not use if you have an allergy to CHG or antibacterial soaps. If your skin becomes reddened/irritated stop using the CHG.  1. Shower the NIGHT BEFORE SURGERY and the MORNING OF SURGERY with CHG soap.  2. If you choose to wash your hair, wash your hair first as usual with your normal shampoo.  3. After shampooing, rinse your hair and body thoroughly to remove the shampoo.  4. Use CHG as you would any other liquid soap. You can apply CHG directly to the skin and wash gently with a scrungie or a  clean washcloth.  5. Apply the CHG soap to your body only from the neck down. Do not use on open wounds or open sores. Avoid contact with your eyes, ears, mouth, and genitals (private parts). Wash face and genitals (private parts) with your normal soap.  6. Wash thoroughly, paying special attention to the area where your surgery will be performed.  7. Thoroughly rinse your body with warm water.  8. Do not shower/wash with your normal soap after using and rinsing off the CHG soap.  9. Pat yourself dry with a clean towel.  10. Wear clean pajamas to bed the night before surgery.  12. Place clean sheets on your bed the night of your first shower and do not sleep with pets.  13. Shower again with the CHG soap on the day of surgery prior to arriving at the hospital.  14. Do not apply any deodorants/lotions/powders.  15. Please wear clean clothes to the hospital.

## 2023-11-02 ENCOUNTER — Encounter: Payer: Self-pay | Admitting: Orthopedic Surgery

## 2023-11-02 NOTE — Progress Notes (Signed)
 Perioperative / Anesthesia Services  Pre-Admission Testing Clinical Review / Pre-Operative Anesthesia Consult  Date: 11/02/23  PATIENT DEMOGRAPHICS: Name: Penny Reed DOB: 07/13/1945 MRN:   [id number]  Note: Available PAT nursing documentation and vital signs have been reviewed. Clinical nursing staff has updated patient's PMH/PSHx, current medication list, and drug allergies/intolerances to ensure complete and comprehensive history available to assist care teams in MDM as it pertains to the aforementioned surgical procedure and anticipated anesthetic course. Extensive review of available clinical information personally performed. [facility] [facility] PMH and PSHx updated with any diagnoses/procedures that  may have been inadvertently omitted during his intake with the pre-admission testing department's nursing staff.  PLANNED SURGICAL PROCEDURE(S):   Case: [id number] Date/Time: 11/03/23 1017   Procedures:      ARTHROSCOPY, SHOULDER WITH REPAIR, ROTATOR CUFF, OPEN (Left: Shoulder) - Left shoulder arthroscopic vs mini-open rotator cuff repair (subscapularis & supraspinatus), subacromial decompression, distal clavicle excision, and biceps tenodesis     DECOMPRESSION, SUBACROMIAL SPACE (Left: Shoulder) - Left shoulder arthroscopic vs mini-open rotator cuff repair (subscapularis & supraspinatus), subacromial decompression, distal clavicle excision, and biceps tenodesis     TENODESIS, BICEPS (Left: Shoulder) - Left shoulder arthroscopic vs mini-open rotator cuff repair (subscapularis & supraspinatus), subacromial decompression, distal clavicle excision, and biceps tenodesis   Anesthesia type: Choice   Diagnosis: Traumatic complete tear of left rotator cuff, initial encounter [S46.012A]   Pre-op diagnosis: Traumatic complete tear of left rotator cuff, initial encounter D53.987J   Location: [facility] OR ROOM 05 / [facility]   Surgeons: Tobie Priest, Penny Reed        CLINICAL  DISCUSSION: Penny Reed is a 78 y.o. female who is submitted for pre-surgical anesthesia review and clearance prior to her undergoing the above procedure. Patient is a Former Smoker (6.3 pack years; quit 04/2005). Pertinent PMH includes: CAD (s/p CABG), MI, HFpEF, atrial fibrillation/flutter, PVD, ascending aortic aneurysm, descending aorta dilatation, aortic atherosclerosis, bradycardia, cardiomegaly, angina, HTN, HLD, hypothyroidism, COPD, OSAH (no nocturnal PAP therapy), GERD (no daily Tx), complete LEFT rotator cuff tear, DJD, insomnia (on hypnotic).  Patient is followed by cardiology Philippe, Penny Reed). She was last seen in the cardiology clinic on 10/18/2023; notes reviewed. At the time of her clinic visit, patient doing well overall from a cardiovascular perspective.  Patient with chronic dyspnea related to her underlying COPD diagnosis, patient denied any chest pain, shortness of breath, PND, orthopnea, palpitations, significant peripheral edema, weakness, fatigue, vertiginous symptoms, or presyncope/syncope. Patient with a past medical history significant for cardiovascular diagnoses. Documented physical exam was grossly benign, providing no evidence of acute exacerbation and/or decompensation of the patient's known cardiovascular conditions.  Patient suffered a septal myocardial infarction on 10/17/2005.  She underwent diagnostic LEFT heart catheterization revealing multivessel CAD; 50% proximal LAD, 75% mid LAD, 50% distal LAD, 75% mid LCx-1, 50% mid LCx-2, 50% distal LCx, 50% OM1-1, 50% OM1-2, 25% proximal RCA, 25% mid RCA, 75% distal RCA, and 75% RPDA.  Given the degree and complexity of patient's coronary artery disease, plans were to refer patient to CVTS for discussions regarding revascularization.  Due to progressive anginal symptoms, patient did proceed to PCI the following day placing a 3.0 x 18 mm Cypher DES to the mid LAD, (LAD, 3.0 x 13 mm Cypher DES to OM1, and a 3.0 x 18 mm Cypher DES  to the mid LAD.  Procedure yielded excellent angiographic result and TIMI-3 flow.  Patient underwent two-vessel revascularization procedure on 02/24/2010.  LIMA-LAD and SVG-OM1 bypass grafts were  placed.  Patient with a longstanding history of atrial fibrillation.  She has undergone a total of 3 cardiac ablation (PVI + CTI) procedures in the past (09/04/2020, 05/31/2021, and in 09/19/2023) in addition to 2 x DCCV procedures (04/02/2021; 150 J x1) and 07/28/2021 (200 J x1).   Cardiac MRI was performed on 08/30/2022 revealing a normal left ventricular systolic function with an EF of 56%.  There was focal akinesis of the basal to mid septal wall with associated perfusion defect on stress images.  Some of the endocardial LGE noted in the basal to mid septal wall consistent with prior myocardial infarction.  LGE >50% transmural, suggesting nonviability in this region.  Right ventricular size and function normal; RVEF 57%.  Ascending aorta dilated measuring up to 42 mm.  Descending aorta also dilated measuring up to 35 mm.  Pulmonary artery was dilated at 32 mm.  There was no evidence of ischemia.  Most recent TTE performed on 04/11/2023 revealed a normal left ventricular systolic function with an EF of 55-60%.  There was moderate asymmetric LVH of the basal septal segment.  There was a focal region of severe hypokinesis with thinning of the myocardium in the mid septal wall.  Additionally, there was a small pseudoaneurysm noted in the mid septal region that was akinetic with no evidence of rupture.  Ventricular diastolic Doppler parameters were indeterminate.  Average left ventricular GLS -20.8%. Right ventricular size and function normal with a TAPSE measuring 2.6 cm  (normal range >/= 1.6 cm).  TR jet inadequate for assessing PA pressure.  Left atrium severely dilated.  There was mild to moderate mitral and moderate aortic valve regurgitation.  Aortic valve sclerosis was present.  All transvalvular gradients  were noted to be normal providing no evidence of hemodynamically significant valvular stenosis.  There was mild dilatation of the ascending aorta measuring up to 42 mm.  Cardiac CT was performed on 08/30/2023 revealing normal pulmonary vein drainage into the left atrium.  The LAA was a windsock type with ostial size of 28 x 25 mm and a length of 32 mm; area 52 mm.  Contrast stasis was observed, however there was no thrombus in the LAA.  Esophagus plan Penny Reed LEFT atrial midline, however was not in the proximity to any of the pulmonary veins.  Again, patient with an atrial fibrillation diagnosis; CHA2DS2-VASc Score = 6 (age x 2, sex, HFpEF, HTN, prior MI/vascular disease).  She has undergone multiple ablations (3) and cardioversions (2) in the past.  Rate and rhythm currently being maintained on oral metoprolol  succinate.  Patient is chronically anticoagulated using rivaroxaban .  Blood pressure well-controlled at 112/62 mmHg on currently prescribed CCB (amlodipine ), ACEi (benazepril ), beta-blocker (metoprolol  succinate), and diuretic (torsemide) therapies. Patient is on atorvastatin  for her HLD diagnosis and further ASCVD prevention.  Patient has a supply of short acting nitrates (NTG) to use on a as needed basis for recurrent angina/anginal equivalent symptoms; denied recent use.  For added cardiovascular and renovascular protection, patient is also on an SGLT2i (empagliflozin). Patient is not diabetic.  She does not have an OSAH diagnosis. Patient is able to complete all of her  ADL/IADLs without cardiovascular limitation.  Per the DASI, patient is able to achieve at least 4 METS of physical activity without experiencing any significant degree of angina/anginal equivalent symptoms.  No changes were made to her medication regimen.  Patient to follow-up with outpatient cardiology in 6 months or sooner if needed.  Penny Reed is scheduled for an elective  ARTHROSCOPY, SHOULDER WITH REPAIR, ROTATOR CUFF, OPEN  (Left: Shoulder); DECOMPRESSION, SUBACROMIAL SPACE (Left: Shoulder); TENODESIS, BICEPS (Left: Shoulder) on 11/03/2023 with Dr. Earnestine Blanch, Penny Reed. Given patient's past medical history significant for cardiovascular diagnoses, presurgical cardiac clearance was sought by the PAT team. Per cardiology, this patient is optimized for surgery and may proceed with the planned procedural course with a LOW risk of significant perioperative cardiovascular complications.  Again, this patient is on daily oral anticoagulation therapy using a DOAC medication.  She has been instructed on recommendations for holding her rivaroxaban  for 2 days prior to her procedure with plans to restart as soon as postoperative bleeding risk felt to be minimized by his primary attending surgeon. The patient has been instructed that her last dose of should be on 10/31/2023.  Patient reports previous perioperative complications with anesthesia in the past.  Patient advising that she experience (+) premature emergence from anesthesia during routine colonoscopy.  In review her EMR, it is noted that patient underwent a general anesthetic course at [facility] in 08/2023 without documented complications.   MOST RECENT VITAL SIGNS:    11/01/2023   10:31 AM 10/17/2023   12:55 PM 10/17/2023    9:17 AM  Vitals with BMI  Height 5' 7 5' 7 5' 7  Weight 175 lbs 178 lbs 4 oz 178 lbs 2 oz  BMI 27.4 27.91 27.89  Systolic  102 150  Diastolic  50 63  Pulse  53 52   PROVIDERS/SPECIALISTS: NOTE: Primary physician provider listed below. Patient may have been seen by APP or partner within same practice.   PROVIDER ROLE / SPECIALTY LAST Penny Blanch Earnestine, [facility] (Surgeon) 10/30/2023  Bertrum Charlie CROME, Penny Reed Primary Care Provider 10/24/2023  Florencio Shine, Penny Reed [facility] 10/18/2023   ALLERGIES: Allergies  Allergen Reactions   Amoxicillin Nausea Only   Adhesive [Tape] Other (See Comments)    Band-aids irritate skin    Azithromycin  Diarrhea and Nausea And Vomiting   Eliquis  [Apixaban ] Swelling    Leg and ankle swelling    CURRENT HOME MEDICATIONS: No current facility-administered medications for this encounter.    acetaminophen  (TYLENOL ) 500 MG tablet   albuterol  (PROAIR  HFA) 108 (90 BASE) MCG/ACT inhaler   amLODipine  (NORVASC ) 5 MG tablet   atorvastatin  (LIPITOR) 80 MG tablet   Azelastine  HCl 137 MCG/SPRAY SOLN   benazepril  (LOTENSIN ) 20 MG tablet   empagliflozin (JARDIANCE) 10 MG TABS tablet   levothyroxine  (SYNTHROID ) 112 MCG tablet   metoprolol  succinate (TOPROL -XL) 25 MG 24 hr tablet   nitroGLYCERIN (NITROSTAT) 0.4 MG SL tablet   torsemide (DEMADEX) 20 MG tablet   umeclidinium-vilanterol (ANORO ELLIPTA ) 62.5-25 MCG/ACT AEPB   XARELTO  20 MG TABS tablet   zolpidem  (AMBIEN ) 5 MG tablet   Multiple Vitamin (MULTIVITAMIN) tablet   HISTORY: Past Medical History:  Diagnosis Date   (HFpEF) heart failure with preserved ejection fraction (HCC)    Acquired hypothyroidism    Allergic rhinitis    Angina pectoris (HCC)    Aortic atherosclerosis (HCC)    Ascending aortic aneurysm (HCC)    Atrial fibrillation and flutter (HCC)    a.) CHA2DS2VASc = 6 (age x 2, sex, HFpEF, HTN, prior MI/vascular disease) as of 11/01/2023; b.) s/p ablation 09/04/2020; c.) s/p DCCV (150 J x 1) 04/02/2021; d.) s/p ablation 05/31/2021; e.) s/p DCCV (200 J x 1) 07/28/2021; f.) s/p ablation 09/19/2023; g.) rate/rhythm maintained on oral metoprolol  succinate; chronically anticoagulated with rivaroxaban    Bradycardia    CAD (coronary  artery disease)    Cardiomegaly    Complete tear of left rotator cuff    Complication of anesthesia    a.) premature emergence during routine colonoscopy   COPD (chronic obstructive pulmonary disease) (HCC)    Dilation of descending aorta (HCC)    DJD (degenerative joint disease)    Essential hypertension    Former smoker    GERD (gastroesophageal reflux disease)    Hyperlipidemia    Insomnia     a.) on hypnotic PRN (zolpidem )   On rivaroxaban  therapy    OSA (obstructive sleep apnea)    a.) no nocturnal PAP therapy; utilizes mandubular advancement device (dental device)   Peripheral edema    Peripheral vascular disease (HCC)    S/P CABG x 2 02/24/2010   a.) LIMA-LAD, SVG-OM1   Septal myocardial infarction (HCC) 10/17/2005   a.) PCI 10/18/2005: 75% mLAD (3.0 x 18 mm Cypher DES). 95% mLAD (3.0 x 18 mm Cypher DES), 75% dLAD (2.5 x 18 mm Cypher DES), 80% OM1 (3.0 x 13 mm Cypher DES)   Status post bilateral cataract extraction (pseudophakia of both eyes)    Past Surgical History:  Procedure Laterality Date   APPENDECTOMY     ATRIAL FIBRILLATION ABLATION N/A 05/31/2021   Procedure: ATRIAL FIBRILLATION ABLATION;  Surgeon: Cindie Ole DASEN, Penny Reed;  Location: [facility] INVASIVE CV LAB;  Service: Cardiovascular;  Laterality: N/A;   ATRIAL FIBRILLATION ABLATION N/A 09/19/2023   Procedure: ATRIAL FIBRILLATION ABLATION;  Surgeon: Cindie Ole DASEN, Penny Reed;  Location: [facility] INVASIVE CV LAB;  Service: Cardiovascular;  Laterality: N/A;   CARDIOVERSION N/A 04/02/2021   Procedure: CARDIOVERSION;  Surgeon: Hobart Powell BRAVO, Penny Reed;  Location: [facility] ENDOSCOPY;  Service: Cardiovascular;  Laterality: N/A;   CARDIOVERSION N/A 07/28/2021   Procedure: CARDIOVERSION;  Surgeon: Alvan Ronal BRAVO, Penny Reed;  Location: [facility] ENDOSCOPY;  Service: Cardiovascular;  Laterality: N/A;   CATARACT EXTRACTION W/ INTRAOCULAR LENS IMPLANT Right 08/22/2023   CATARACT EXTRACTION W/ INTRAOCULAR LENS IMPLANT Left 08/29/2023   COLONOSCOPY WITH PROPOFOL  N/A 12/04/2019   Procedure: COLONOSCOPY WITH PROPOFOL ;  Surgeon: Dessa Reyes ORN, Penny Reed;  Location: [facility] ENDOSCOPY;  Service: Endoscopy;  Laterality: N/A;   CORONARY ANGIOPLASTY WITH STENT PLACEMENT Left 10/18/2005   Procedure: CORONARY ANGIOPLASTY WITH STENT PLACEMENT; Location: [facility]; Surgeon: Margie Lovelace, Penny Reed   CORONARY ARTERY BYPASS GRAFT N/A 02/24/2010   HYSTERECTOMY, VAGINAL, WITH  SALPINGO-OOPHORECTOMY Right    LEFT HEART CATH AND CORONARY ANGIOGRAPHY Left 10/17/2005   Procedure: LEFT HEART CATH AND CORONARY ANGIOGRAPHY; Location: [facility]; Surgeon: Margie Lovelace, Penny Reed   SVT ABLATION N/A 09/04/2020   Procedure: SVT ABLATION;  Surgeon: Cindie Ole DASEN, Penny Reed;  Location: [facility] INVASIVE CV LAB;  Service: Cardiovascular;  Laterality: N/A;   TONSILLECTOMY     Family History  Problem Relation Age of Onset   Diabetes Mother    Stroke Mother        cause of death   Coronary artery disease Father    Heart attack Father    Hyperlipidemia Sister    Hypertension Sister    Breast cancer Sister 78   Cancer Sister        breast cancer   Asthma Sister    Scleroderma Sister    Thyroid  disease Sister    Heart attack Brother    Cancer - Colon Paternal Grandmother    Breast cancer Maternal Aunt        13s   Social History   Tobacco Use   Smoking status: Former    Current packs/day:  0.00    Average packs/day: 0.3 packs/day for 25.0 years (6.3 ttl pk-yrs)    Types: Cigarettes    Start date: 04/25/1980    Quit date: 04/25/2005    Years since quitting: 18.5   Smokeless tobacco: Never   Tobacco comments:    Former smoker 06/28/21  Substance Use Topics   Alcohol use: Yes    Alcohol/week: 6.0 - 14.0 standard drinks of alcohol    Types: 6 - 14 Glasses of wine per week    Comment: 2 glasses in a setting over 3-7 days a week 06/28/21   LABS:  Lab Results  Component Value Date   WBC 6.2 09/08/2023   HGB 12.6 09/08/2023   HCT 37.2 09/08/2023   MCV 97.9 09/08/2023   PLT 233 09/08/2023   Lab Results  Component Value Date   NA 139 11/01/2023   CL 104 11/01/2023   K 3.5 11/01/2023   CO2 24 11/01/2023   BUN 22 11/01/2023   CREATININE 0.78 11/01/2023   GFRNONAA >60 11/01/2023   CALCIUM  9.3 11/01/2023   ALBUMIN 4.2 09/08/2023   GLUCOSE 111 (H) 11/01/2023   ECG: Date: 10/17/2023  Time ECG obtained: 1258 PM Rate: 53 bpm Rhythm: Junctional rhythm with occasional PVCs Axis  (leads I and aVF): normal Intervals: QRS 96 ms. QTc 476 ms. ST segment and T wave changes: No evidence of acute T wave abnormalities or significant ST segment elevation or depression.  Evidence of a possible, age undetermined, prior infarct:  No Comparison: Previous tracing obtained on 09/19/2023 showed sinus rhythm with first-degree AV block at a rate of 60 bpm.  There were ST and T wave abnormalities in the inferior and anterolateral leads.   IMAGING / PROCEDURES: VAS US  ABI WITH/WO TBI performed on 10/17/2023 Resting right ankle-brachial index indicates mild right lower extremity arterial disease. The right toe-brachial index is abnormal.  Resting left ankle-brachial index indicates moderate left lower extremity arterial disease. The left toe-brachial index is abnormal. abnormal.    CT CARDIAC MORPH/PULM VEIN W/CM&W/O CA SCORE performed on 08/30/2023 Small pleural effusions. Right solid pulmonary nodule measuring 5 mm. Per Fleischner Society Guidelines, no routine follow-up imaging is recommended. These guidelines do not apply to immunocompromised patients and patients with cancer. Follow up in patients with significant comorbidities as clinically warranted. For lung cancer screening, adhere to Lung-RADS guidelines. Reference: Radiology. 2017; 284(1):228-43. No acute findings. There is normal pulmonary vein drainage into the left atrium. The left atrial appendage is a Windsock type with ostial size 28 x 25 mm and length 32 mm, Area 52 mm2. There is contrast stasis, but no thrombus in the left atrial appendage. The esophagus runs in the left atrial midline and is not in the proximity to any of the pulmonary veins.  TRANSTHORACIC ECHOCARDIOGRAM performed on 04/11/2023 Left ventricular ejection fraction, by estimation, is 55 to 60%. The left ventricle has normal function. The left ventricle demonstrates regional wall motion abnormalities (focal region of severe hypokinesis with thinning of the  myocardium in the mid  septal wall). There is moderate asymmetric left ventricular hypertrophy of the basal-septal segment. Left ventricular diastolic parameters are indeterminate. The average left ventricular global longitudinal strain is -20.8 %.  Right ventricular systolic function is normal. The right ventricular size is normal. Tricuspid regurgitation signal is inadequate for assessing PA pressure.  Left atrial size was severely dilated.  The mitral valve is normal in structure. Mild to moderate mitral valve regurgitation. No evidence of mitral stenosis. Moderate mitral annular  calcification.  The aortic valve has an indeterminant number of cusps. Aortic valve regurgitation is moderate. Aortic valve sclerosis is present, with no evidence of aortic valve stenosis.  There is mild dilatation of the ascending aorta, measuring 42 mm.  The inferior vena cava is normal in size with greater than 50% respiratory variability, suggesting right atrial pressure of 3 mmHg.   MR CARDIAC STRESS TEST performed on 08/30/2022 Mid septal perfusion defect on stress imaging, corresponding to area of enhancement on LGE imaging, consistent with prior infarct. No ischemia seen Subendocardial LGE in focal region of basal to mid septal wall, consistent with prior infarct. LGE is greater than 50% transmural, suggesting nonviability in this region Mild LV dilatation, mild hypertrophy, and normal global systolic function (EF 56%). Focal area of akinesis in basal to mid septal wall Normal RV size and systolic function (EF 57%) Dilated ascending aorta measuring 42 mm. Dilated descending aorta measuring 35 mm Dilated main pulmonary artery measuring 32 mm  PULMONARY FUNCTION TESTING performed on 07/26/2022    Latest Ref Rng & Units 07/26/2022    3:37 PM 09/25/2014    2:43 PM  PFT Results  FVC-Pre L 2.06  3.01   FVC-Predicted Pre % 68  91   FVC-Post L 2.14  3.00   FVC-Predicted Post % 71  91   Pre FEV1/FVC % % 79  76    Post FEV1/FCV % % 78  80   FEV1-Pre L 1.63  2.28   FEV1-Predicted Pre % 72  91   FEV1-Post L 1.66  2.40   DLCO uncorrected ml/min/mmHg  19.64   DLCO UNC% %  74   DLVA Predicted %  83   TLC L 4.25  5.06   TLC % Predicted % 80  95   RV % Predicted % 89  99     IMPRESSION AND PLAN: AIYLA BAUCOM has been referred for pre-anesthesia review and clearance prior to her undergoing the planned anesthetic and procedural courses. Available labs, pertinent testing, and imaging results were personally reviewed by me in preparation for upcoming operative/procedural course. [facility] medical record has been updated following extensive record review and patient interview with PAT staff.   This patient has been appropriately cleared by cardiology with an overall LOW risk of patient experiencing significant perioperative cardiovascular complications. Based on clinical review performed today (11/02/23), barring any significant acute changes in the patient's overall condition, it is anticipated that she will be able to proceed with the planned surgical intervention. Any acute changes in clinical condition may necessitate her procedure being postponed and/or cancelled. Patient will meet with anesthesia team (Penny Reed and/or CRNA) on the day of her procedure for preoperative evaluation/assessment. Questions regarding anesthetic course will be fielded at that time.   Pre-surgical instructions were reviewed with the patient during his PAT appointment, and questions were fielded to satisfaction by PAT clinical staff. She has been instructed on which medications that she will need to hold prior to surgery, as well as the ones that have been deemed safe/appropriate to take on the day of her procedure. As part of the general education provided by PAT, patient made aware both verbally and in writing, that she would need to abstain from the use of any illegal substances during her perioperative course. She was advised that  failure to follow the provided instructions could necessitate case cancellation or result in serious perioperative complications up to and including death. Patient encouraged to contact PAT and/or her surgeon's office to  discuss any questions or concerns that may arise prior to surgery; verbalized understanding.   Dorise Pereyra, MSN, APRN, FNP-C, CEN [facility]  Perioperative Services Nurse Practitioner Phone: [phone] Fax: [phone] 11/02/23 11:03 AM  NOTE: This note has been prepared using Dragon dictation software. Despite my best ability to proofread, there is always the potential that unintentional transcriptional errors may still occur from this process.

## 2023-11-03 ENCOUNTER — Other Ambulatory Visit: Payer: Self-pay

## 2023-11-03 ENCOUNTER — Encounter: Payer: Self-pay | Admitting: Orthopedic Surgery

## 2023-11-03 ENCOUNTER — Ambulatory Visit
Admission: RE | Admit: 2023-11-03 | Discharge: 2023-11-03 | Disposition: A | Discharge status: Home / Self Care | Attending: Orthopedic Surgery | Admitting: Orthopedic Surgery

## 2023-11-03 ENCOUNTER — Encounter
Admission: RE | Disposition: A | Discharge status: Home / Self Care | Payer: Self-pay | Source: Home / Self Care | Attending: Orthopedic Surgery

## 2023-11-03 ENCOUNTER — Ambulatory Visit: Payer: Self-pay | Admitting: Urgent Care

## 2023-11-03 DIAGNOSIS — I110 Hypertensive heart disease with heart failure: Secondary | ICD-10-CM | POA: Insufficient documentation

## 2023-11-03 DIAGNOSIS — E785 Hyperlipidemia, unspecified: Secondary | ICD-10-CM | POA: Insufficient documentation

## 2023-11-03 DIAGNOSIS — I2510 Atherosclerotic heart disease of native coronary artery without angina pectoris: Secondary | ICD-10-CM | POA: Insufficient documentation

## 2023-11-03 DIAGNOSIS — I4891 Unspecified atrial fibrillation: Secondary | ICD-10-CM | POA: Insufficient documentation

## 2023-11-03 DIAGNOSIS — K219 Gastro-esophageal reflux disease without esophagitis: Secondary | ICD-10-CM | POA: Diagnosis not present

## 2023-11-03 DIAGNOSIS — Z7901 Long term (current) use of anticoagulants: Secondary | ICD-10-CM | POA: Insufficient documentation

## 2023-11-03 DIAGNOSIS — J449 Chronic obstructive pulmonary disease, unspecified: Secondary | ICD-10-CM | POA: Insufficient documentation

## 2023-11-03 DIAGNOSIS — G4700 Insomnia, unspecified: Secondary | ICD-10-CM | POA: Diagnosis not present

## 2023-11-03 DIAGNOSIS — Z79899 Other long term (current) drug therapy: Secondary | ICD-10-CM | POA: Diagnosis not present

## 2023-11-03 DIAGNOSIS — M19012 Primary osteoarthritis, left shoulder: Secondary | ICD-10-CM | POA: Diagnosis not present

## 2023-11-03 DIAGNOSIS — Z951 Presence of aortocoronary bypass graft: Secondary | ICD-10-CM | POA: Insufficient documentation

## 2023-11-03 DIAGNOSIS — M25812 Other specified joint disorders, left shoulder: Secondary | ICD-10-CM | POA: Insufficient documentation

## 2023-11-03 DIAGNOSIS — E039 Hypothyroidism, unspecified: Secondary | ICD-10-CM | POA: Insufficient documentation

## 2023-11-03 DIAGNOSIS — G4733 Obstructive sleep apnea (adult) (pediatric): Secondary | ICD-10-CM | POA: Insufficient documentation

## 2023-11-03 DIAGNOSIS — I480 Paroxysmal atrial fibrillation: Secondary | ICD-10-CM | POA: Diagnosis not present

## 2023-11-03 DIAGNOSIS — Z955 Presence of coronary angioplasty implant and graft: Secondary | ICD-10-CM | POA: Insufficient documentation

## 2023-11-03 DIAGNOSIS — M1990 Unspecified osteoarthritis, unspecified site: Secondary | ICD-10-CM | POA: Insufficient documentation

## 2023-11-03 DIAGNOSIS — I700 Atherosclerosis of aorta: Secondary | ICD-10-CM | POA: Insufficient documentation

## 2023-11-03 DIAGNOSIS — M67814 Other specified disorders of tendon, left shoulder: Secondary | ICD-10-CM | POA: Insufficient documentation

## 2023-11-03 DIAGNOSIS — W1839XA Other fall on same level, initial encounter: Secondary | ICD-10-CM | POA: Diagnosis not present

## 2023-11-03 DIAGNOSIS — I4892 Unspecified atrial flutter: Secondary | ICD-10-CM | POA: Insufficient documentation

## 2023-11-03 DIAGNOSIS — Z87891 Personal history of nicotine dependence: Secondary | ICD-10-CM | POA: Insufficient documentation

## 2023-11-03 DIAGNOSIS — I714 Abdominal aortic aneurysm, without rupture, unspecified: Secondary | ICD-10-CM | POA: Diagnosis not present

## 2023-11-03 DIAGNOSIS — I252 Old myocardial infarction: Secondary | ICD-10-CM | POA: Diagnosis not present

## 2023-11-03 DIAGNOSIS — S46012A Strain of muscle(s) and tendon(s) of the rotator cuff of left shoulder, initial encounter: Secondary | ICD-10-CM | POA: Insufficient documentation

## 2023-11-03 DIAGNOSIS — I739 Peripheral vascular disease, unspecified: Secondary | ICD-10-CM | POA: Insufficient documentation

## 2023-11-03 DIAGNOSIS — I5032 Chronic diastolic (congestive) heart failure: Secondary | ICD-10-CM | POA: Insufficient documentation

## 2023-11-03 DIAGNOSIS — Z9842 Cataract extraction status, left eye: Secondary | ICD-10-CM

## 2023-11-03 HISTORY — DX: Unspecified atrial fibrillation: I48.92

## 2023-11-03 HISTORY — DX: Unspecified diastolic (congestive) heart failure: I50.30

## 2023-11-03 HISTORY — DX: Unspecified osteoarthritis, unspecified site: M19.90

## 2023-11-03 HISTORY — DX: Personal history of nicotine dependence: Z87.891

## 2023-11-03 HISTORY — PX: SUBACROMIAL DECOMPRESSION: SHX5174

## 2023-11-03 HISTORY — DX: Cardiomegaly: I51.7

## 2023-11-03 HISTORY — DX: Localized edema: R60.0

## 2023-11-03 HISTORY — DX: Bradycardia, unspecified: R00.1

## 2023-11-03 HISTORY — DX: Complete rotator cuff tear or rupture of left shoulder, not specified as traumatic: M75.122

## 2023-11-03 HISTORY — DX: Obstructive sleep apnea (adult) (pediatric): G47.33

## 2023-11-03 HISTORY — PX: SHOULDER ARTHROSCOPY WITH ROTATOR CUFF REPAIR: SHX5685

## 2023-11-03 HISTORY — PX: BICEPT TENODESIS: SHX5116

## 2023-11-03 HISTORY — DX: Angina pectoris, unspecified: I20.9

## 2023-11-03 HISTORY — DX: Insomnia, unspecified: G47.00

## 2023-11-03 HISTORY — DX: Atherosclerosis of aorta: I70.0

## 2023-11-03 HISTORY — DX: Aortic ectasia, unspecified site: I77.819

## 2023-11-03 HISTORY — DX: Cataract extraction status, right eye: Z98.42

## 2023-11-03 HISTORY — DX: Long term (current) use of anticoagulants: Z79.01

## 2023-11-03 SURGERY — ARTHROSCOPY, SHOULDER, WITH ROTATOR CUFF REPAIR
Anesthesia: General | Site: Shoulder | Laterality: Left

## 2023-11-03 MED ORDER — ORAL CARE MOUTH RINSE: 15.0000 mL | Freq: Once | OROMUCOSAL | Status: AC

## 2023-11-03 MED ORDER — CEFAZOLIN SODIUM-DEXTROSE 2-4 GM/100ML-% IV SOLN
INTRAVENOUS | Status: AC
  Filled 2023-11-03: qty 100

## 2023-11-03 MED ORDER — ROCURONIUM BROMIDE 10 MG/ML (PF) SYRINGE
PREFILLED_SYRINGE | INTRAVENOUS | Status: AC
Start: 2023-11-03 — End: 2023-11-03
  Filled 2023-11-03: qty 10

## 2023-11-03 MED ORDER — DEXMEDETOMIDINE HCL IN NACL 80 MCG/20ML IV SOLN
INTRAVENOUS | Status: DC | PRN
  Administered 2023-11-03: 8 ug via INTRAVENOUS
  Administered 2023-11-03: 4 ug via INTRAVENOUS
  Administered 2023-11-03: 8 ug via INTRAVENOUS

## 2023-11-03 MED ORDER — PROPOFOL 10 MG/ML IV BOLUS
INTRAVENOUS | Status: AC
  Filled 2023-11-03: qty 20

## 2023-11-03 MED ORDER — OXYCODONE HCL 5 MG/5ML PO SOLN: 5.0000 mg | Freq: Once | ORAL | Status: AC | PRN

## 2023-11-03 MED ORDER — PROPOFOL 10 MG/ML IV BOLUS
INTRAVENOUS | Status: DC | PRN
  Administered 2023-11-03: 130 mg via INTRAVENOUS

## 2023-11-03 MED ORDER — LACTATED RINGERS IV SOLN: INTRAVENOUS | Status: DC

## 2023-11-03 MED ORDER — KETAMINE HCL 50 MG/5ML IJ SOSY
PREFILLED_SYRINGE | INTRAMUSCULAR | Status: AC
  Filled 2023-11-03: qty 5

## 2023-11-03 MED ORDER — OXYCODONE HCL 5 MG PO TABS
5.0000 mg | ORAL_TABLET | Freq: Once | ORAL | Status: AC | PRN
  Administered 2023-11-03: 5 mg via ORAL

## 2023-11-03 MED ORDER — ACETAMINOPHEN 10 MG/ML IV SOLN
INTRAVENOUS | Status: AC
  Filled 2023-11-03: qty 100

## 2023-11-03 MED ORDER — FENTANYL CITRATE (PF) 100 MCG/2ML IJ SOLN
INTRAMUSCULAR | Status: AC
  Filled 2023-11-03: qty 2

## 2023-11-03 MED ORDER — BUPIVACAINE-EPINEPHRINE (PF) 0.5% -1:200000 IJ SOLN
INTRAMUSCULAR | Status: DC | PRN
  Administered 2023-11-03: 20 mL via INTRAMUSCULAR

## 2023-11-03 MED ORDER — ACETAMINOPHEN 10 MG/ML IV SOLN
INTRAVENOUS | Status: DC | PRN
  Administered 2023-11-03: 1000 mg via INTRAVENOUS

## 2023-11-03 MED ORDER — HYDROMORPHONE HCL 1 MG/ML IJ SOLN
INTRAMUSCULAR | Status: AC
Start: 2023-11-03 — End: 2023-11-03
  Filled 2023-11-03: qty 1

## 2023-11-03 MED ORDER — OXYCODONE HCL 5 MG PO TABS
ORAL_TABLET | ORAL | Status: AC
  Filled 2023-11-03: qty 1

## 2023-11-03 MED ORDER — ONDANSETRON 4 MG PO TBDP: 4.0000 mg | ORAL_TABLET | Freq: Three times a day (TID) | ORAL | 0 refills | Status: AC | PRN

## 2023-11-03 MED ORDER — HYDROMORPHONE HCL 1 MG/ML IJ SOLN
INTRAMUSCULAR | Status: DC | PRN
  Administered 2023-11-03 (×3): .5 mg via INTRAVENOUS

## 2023-11-03 MED ORDER — ROCURONIUM BROMIDE 10 MG/ML (PF) SYRINGE
PREFILLED_SYRINGE | INTRAVENOUS | Status: AC
  Filled 2023-11-03: qty 10

## 2023-11-03 MED ORDER — ROCURONIUM BROMIDE 100 MG/10ML IV SOLN
INTRAVENOUS | Status: DC | PRN
  Administered 2023-11-03: 20 mg via INTRAVENOUS
  Administered 2023-11-03: 30 mg via INTRAVENOUS
  Administered 2023-11-03: 10 mg via INTRAVENOUS
  Administered 2023-11-03: 50 mg via INTRAVENOUS

## 2023-11-03 MED ORDER — ACETAMINOPHEN 500 MG PO TABS: 1000.0000 mg | ORAL_TABLET | Freq: Three times a day (TID) | ORAL | 2 refills | Status: AC

## 2023-11-03 MED ORDER — FENTANYL CITRATE (PF) 100 MCG/2ML IJ SOLN
25.0000 ug | INTRAMUSCULAR | Status: DC | PRN
  Administered 2023-11-03: 50 ug via INTRAVENOUS
  Administered 2023-11-03 (×3): 25 ug via INTRAVENOUS

## 2023-11-03 MED ORDER — FENTANYL CITRATE (PF) 100 MCG/2ML IJ SOLN
INTRAMUSCULAR | Status: DC | PRN
  Administered 2023-11-03 (×2): 50 ug via INTRAVENOUS

## 2023-11-03 MED ORDER — DEXAMETHASONE SODIUM PHOSPHATE 10 MG/ML IJ SOLN
INTRAMUSCULAR | Status: DC | PRN
Start: 2023-11-03 — End: 2023-11-03
  Administered 2023-11-03: 10 mg via INTRAVENOUS

## 2023-11-03 MED ORDER — KETAMINE HCL 10 MG/ML IJ SOLN
INTRAMUSCULAR | Status: DC | PRN
Start: 2023-11-03 — End: 2023-11-03
  Administered 2023-11-03: 20 mg via INTRAVENOUS

## 2023-11-03 MED ORDER — ONDANSETRON HCL 4 MG/2ML IJ SOLN
INTRAMUSCULAR | Status: DC | PRN
Start: 2023-11-03 — End: 2023-11-03
  Administered 2023-11-03: 4 mg via INTRAVENOUS

## 2023-11-03 MED ORDER — BUPIVACAINE-EPINEPHRINE (PF) 0.5% -1:200000 IJ SOLN
INTRAMUSCULAR | Status: AC
  Filled 2023-11-03: qty 30

## 2023-11-03 MED ORDER — CHLORHEXIDINE GLUCONATE 0.12 % MT SOLN
OROMUCOSAL | Status: AC
  Filled 2023-11-03: qty 15

## 2023-11-03 MED ORDER — EPHEDRINE SULFATE-NACL 50-0.9 MG/10ML-% IV SOSY
PREFILLED_SYRINGE | INTRAVENOUS | Status: DC | PRN
Start: 2023-11-03 — End: 2023-11-03
  Administered 2023-11-03 (×2): 5 mg via INTRAVENOUS

## 2023-11-03 MED ORDER — CHLORHEXIDINE GLUCONATE 0.12 % MT SOLN
15.0000 mL | Freq: Once | OROMUCOSAL | Status: AC
  Administered 2023-11-03: 15 mL via OROMUCOSAL

## 2023-11-03 MED ORDER — HYDROMORPHONE HCL 1 MG/ML IJ SOLN
0.5000 mg | INTRAMUSCULAR | Status: DC | PRN
  Administered 2023-11-03: 0.25 mg via INTRAVENOUS
  Administered 2023-11-03: 0.5 mg via INTRAVENOUS

## 2023-11-03 MED ORDER — MIDAZOLAM HCL 2 MG/2ML IJ SOLN
INTRAMUSCULAR | Status: DC | PRN
  Administered 2023-11-03: 2 mg via INTRAVENOUS

## 2023-11-03 MED ORDER — CEFAZOLIN SODIUM-DEXTROSE 2-4 GM/100ML-% IV SOLN
2.0000 g | INTRAVENOUS | Status: AC
  Administered 2023-11-03: 2 g via INTRAVENOUS

## 2023-11-03 MED ORDER — OXYCODONE HCL 5 MG PO TABS: 5.0000 mg | ORAL_TABLET | ORAL | 0 refills | Status: AC | PRN

## 2023-11-03 MED ORDER — SUGAMMADEX SODIUM 200 MG/2ML IV SOLN
INTRAVENOUS | Status: DC | PRN
  Administered 2023-11-03: 100 mg via INTRAVENOUS
  Administered 2023-11-03: 300 mg via INTRAVENOUS

## 2023-11-03 MED ORDER — LACTATED RINGERS IR SOLN
Status: DC | PRN
  Administered 2023-11-03 (×14): 3000 mL

## 2023-11-03 MED ORDER — OXYCODONE HCL 5 MG PO TABS
5.0000 mg | ORAL_TABLET | Freq: Once | ORAL | Status: AC
  Administered 2023-11-03: 5 mg via ORAL

## 2023-11-03 MED ORDER — HYDROMORPHONE HCL 1 MG/ML IJ SOLN
INTRAMUSCULAR | Status: AC
  Filled 2023-11-03: qty 1

## 2023-11-03 MED ORDER — BUPIVACAINE LIPOSOME 1.3 % IJ SUSP
INTRAMUSCULAR | Status: AC
  Filled 2023-11-03: qty 10

## 2023-11-03 MED ORDER — FENTANYL CITRATE (PF) 100 MCG/2ML IJ SOLN
INTRAMUSCULAR | Status: AC
Start: 2023-11-03 — End: 2023-11-03
  Filled 2023-11-03: qty 2

## 2023-11-03 MED ORDER — MIDAZOLAM HCL 2 MG/2ML IJ SOLN
INTRAMUSCULAR | Status: AC
  Filled 2023-11-03: qty 2

## 2023-11-03 MED ORDER — LIDOCAINE HCL (PF) 2 % IJ SOLN
INTRAMUSCULAR | Status: AC
Start: 2023-11-03 — End: 2023-11-03
  Filled 2023-11-03: qty 5

## 2023-11-03 MED ORDER — LIDOCAINE HCL (CARDIAC) PF 100 MG/5ML IV SOSY
PREFILLED_SYRINGE | INTRAVENOUS | Status: DC | PRN
  Administered 2023-11-03: 80 mg via INTRAVENOUS

## 2023-11-03 MED ORDER — PHENYLEPHRINE 80 MCG/ML (10ML) SYRINGE FOR IV PUSH (FOR BLOOD PRESSURE SUPPORT)
PREFILLED_SYRINGE | INTRAVENOUS | Status: AC
  Filled 2023-11-03: qty 10

## 2023-11-03 SURGICAL SUPPLY — 67 items
ADAPTER IRRIG TUBE 2 SPIKE SOL (ADAPTER) ×4 IMPLANT
ANCHOR 2.3 SP SGL 1.2 XBRAID (Anchor) IMPLANT
ANCHOR SWIVELOCK BIO 4.75X19.1 (Anchor) IMPLANT
ANCHOR SWIVELOCK BIO COMP (Anchor) IMPLANT
BLADE SHAVER 4.5X7 STR FR (MISCELLANEOUS) ×2 IMPLANT
BLADE SW THK.38XMED LNG THN (BLADE) IMPLANT
BNDG ADH 2 X3.75 FABRIC TAN LF (GAUZE/BANDAGES/DRESSINGS) ×2 IMPLANT
BUR BR 5.5 WIDE MOUTH (BURR) IMPLANT
CANNULA PART THRD DISP 5.75X7 (CANNULA) IMPLANT
CANNULA PARTIAL THREAD 2X7 (CANNULA) ×2 IMPLANT
CANNULA TWIST IN 8.25X7CM (CANNULA) IMPLANT
CANNULA TWIST IN 8.25X9CM (CANNULA) IMPLANT
CHLORAPREP W/TINT 26 (MISCELLANEOUS) ×2 IMPLANT
COOLER POLAR GLACIER W/PUMP (MISCELLANEOUS) ×2 IMPLANT
COVER LIGHT HANDLE STERIS (MISCELLANEOUS) ×2 IMPLANT
DERMABOND ADVANCED .7 DNX12 (GAUZE/BANDAGES/DRESSINGS) IMPLANT
DEVICE SUCT BLK HOLE OR FLOOR (MISCELLANEOUS) IMPLANT
DRAPE INCISE IOBAN 66X45 STRL (DRAPES) ×2 IMPLANT
DRAPE U-SHAPE 47X51 STRL (DRAPES) ×4 IMPLANT
DRSG TEGADERM 4X4.75 (GAUZE/BANDAGES/DRESSINGS) IMPLANT
ELECTRODE REM PT RTRN 9FT ADLT (ELECTROSURGICAL) ×2 IMPLANT
GAUZE SPONGE 4X4 12PLY STRL (GAUZE/BANDAGES/DRESSINGS) ×2 IMPLANT
GAUZE XEROFORM 1X8 LF (GAUZE/BANDAGES/DRESSINGS) ×2 IMPLANT
GLOVE BIO SURGEON STRL SZ7.5 (GLOVE) ×4 IMPLANT
GLOVE BIOGEL PI IND STRL 8 (GLOVE) ×4 IMPLANT
GLOVE SURG SYN 8.0 PF PI (GLOVE) ×4 IMPLANT
GOWN STRL REUS W/ TWL LRG LVL3 (GOWN DISPOSABLE) ×4 IMPLANT
GOWN STRL REUS W/TWL XL LVL4 (GOWN DISPOSABLE) ×2 IMPLANT
IV LR IRRIG 3000ML ARTHROMATIC (IV SOLUTION) ×8 IMPLANT
KIT STABILIZATION SHOULDER (MISCELLANEOUS) ×2 IMPLANT
KIT TURNOVER KIT A (KITS) ×2 IMPLANT
MANIFOLD NEPTUNE II (INSTRUMENTS) ×4 IMPLANT
MASK FACE SPIDER DISP (MASK) ×2 IMPLANT
MAT ABSORB FLUID 56X50 GRAY (MISCELLANEOUS) ×4 IMPLANT
NDL HYPO 22X1.5 SAFETY MO (MISCELLANEOUS) ×2 IMPLANT
NDL MAYO 6 CRC TAPER PT (NEEDLE) IMPLANT
NDL SCORPION MULTI FIRE (NEEDLE) IMPLANT
NEEDLE HYPO 22X1.5 SAFETY MO (MISCELLANEOUS) ×1 IMPLANT
NEEDLE MAYO 6 CRC TAPER PT (NEEDLE) IMPLANT
NEEDLE SCORPION MULTI FIRE (NEEDLE) IMPLANT
PACK ARTHROSCOPY SHOULDER (MISCELLANEOUS) ×2 IMPLANT
PAD ABD DERMACEA PRESS 5X9 (GAUZE/BANDAGES/DRESSINGS) ×2 IMPLANT
PAD ARMBOARD POSITIONER FOAM (MISCELLANEOUS) ×4 IMPLANT
PAD WRAPON POLAR SHDR XLG (MISCELLANEOUS) ×2 IMPLANT
PASSER SUT FIRSTPASS SELF (INSTRUMENTS) ×2 IMPLANT
SHAVER BLADE BONE CUTTER 5.5 (BLADE) IMPLANT
SLEEVE REMOTE CONTROL 5X12 (DRAPES) IMPLANT
SLING ULTRA II M (MISCELLANEOUS) ×2 IMPLANT
SPONGE T-LAP 18X18 ~~LOC~~+RFID (SPONGE) ×2 IMPLANT
STRAP SAFETY 5IN WIDE (MISCELLANEOUS) ×2 IMPLANT
SUT ETHILON 3-0 (SUTURE) ×2 IMPLANT
SUT LASSO 90 DEG SD STR (SUTURE) IMPLANT
SUT PROLENE 0 CT 2 (SUTURE) IMPLANT
SUT TICRON 2-0 30IN 311381 (SUTURE) IMPLANT
SUT VIC AB 0 CT1 36 (SUTURE) IMPLANT
SUT VIC AB 2-0 CT2 27 (SUTURE) IMPLANT
SUT XBRAID 1.4 BLK/WHT (SUTURE) IMPLANT
SUTURE MNCRL 4-0 27XMF (SUTURE) IMPLANT
SUTURE TAPE 1.3 40 TPR END (SUTURE) IMPLANT
SYR 10ML LL (SYRINGE) IMPLANT
SYSTEM IMPL TENODESIS LNT 2.9 (Orthopedic Implant) IMPLANT
TAPE MICROFOAM 4IN (TAPE) ×2 IMPLANT
TRAP FLUID SMOKE EVACUATOR (MISCELLANEOUS) ×2 IMPLANT
TUBE SET DOUBLEFLO INFLOW (TUBING) ×2 IMPLANT
TUBE SET DOUBLEFLO OUTFLOW (TUBING) ×2 IMPLANT
WAND WEREWOLF FLOW 90D (MISCELLANEOUS) ×2 IMPLANT
WATER STERILE IRR 500ML POUR (IV SOLUTION) ×2 IMPLANT

## 2023-11-03 NOTE — Anesthesia Preprocedure Evaluation (Signed)
 Anesthesia Evaluation  Patient identified by MRN, date of birth, ID band Patient awake    Reviewed: Allergy & Precautions, NPO status , Patient's Chart, lab work & pertinent test results  History of Anesthesia Complications Negative for: history of anesthetic complications  Airway Mallampati: III  TM Distance: >3 FB Neck ROM: full    Dental no notable dental hx.    Pulmonary shortness of breath, sleep apnea , COPD, former smoker   Pulmonary exam normal        Cardiovascular hypertension, (-) angina + CAD, + Past MI, + Cardiac Stents, + CABG and + Peripheral Vascular Disease  + dysrhythmias Atrial Fibrillation      Neuro/Psych negative neurological ROS  negative psych ROS   GI/Hepatic Neg liver ROS,GERD  ,,  Endo/Other  Hypothyroidism    Renal/GU negative Renal ROS  negative genitourinary   Musculoskeletal   Abdominal   Peds  Hematology negative hematology ROS (+)   Anesthesia Other Findings Past Medical History: No date: (HFpEF) heart failure with preserved ejection fraction (HCC) No date: Acquired hypothyroidism No date: Allergic rhinitis No date: Angina pectoris (HCC) No date: Aortic atherosclerosis (HCC) No date: Ascending aortic aneurysm (HCC) No date: Atrial fibrillation and flutter (HCC)     Comment:  a.) CHA2DS2VASc = 6 (age x 2, sex, HFpEF, HTN, prior               MI/vascular disease) as of 11/01/2023; b.) s/p ablation               09/04/2020; c.) s/p DCCV (150 J x 1) 04/02/2021; d.) s/p               ablation 05/31/2021; e.) s/p DCCV (200 J x 1) 07/28/2021;              f.) s/p ablation 09/19/2023; g.) rate/rhythm maintained               on oral metoprolol  succinate; chronically anticoagulated               with rivaroxaban  No date: Bradycardia No date: CAD (coronary artery disease) No date: Cardiomegaly No date: Complete tear of left rotator cuff No date: Complication of anesthesia      Comment:  a.) premature emergence during routine colonoscopy No date: COPD (chronic obstructive pulmonary disease) (HCC) No date: Dilation of descending aorta (HCC) No date: DJD (degenerative joint disease) No date: Essential hypertension No date: Former smoker No date: GERD (gastroesophageal reflux disease) No date: Hyperlipidemia No date: Insomnia     Comment:  a.) on hypnotic PRN (zolpidem ) No date: On rivaroxaban  therapy No date: OSA (obstructive sleep apnea)     Comment:  a.) no nocturnal PAP therapy; utilizes mandubular               advancement device (dental device) No date: Peripheral edema No date: Peripheral vascular disease (HCC) 02/24/2010: S/P CABG x 2     Comment:  a.) LIMA-LAD, SVG-OM1 10/17/2005: Septal myocardial infarction [facility])     Comment:  a.) PCI 10/18/2005: 75% mLAD (3.0 x 18 mm Cypher DES).               95% mLAD (3.0 x 18 mm Cypher DES), 75% dLAD (2.5 x 18 mm               Cypher DES), 80% OM1 (3.0 x 13 mm Cypher DES) No date: Status post bilateral cataract extraction (pseudophakia of  both eyes)  Past Surgical  History: No date: APPENDECTOMY 05/31/2021: ATRIAL FIBRILLATION ABLATION; N/A     Comment:  Procedure: ATRIAL FIBRILLATION ABLATION;  Surgeon:               Cindie Ole DASEN, MD;  Location: [facility] IN[facility]AS[facility] CV [facility];                Service: Cardiovascular;  Laterality: N/A; 09/19/2023: ATRIAL FIBRILLATION ABLATION; N/A     Comment:  Procedure: ATRIAL FIBRILLATION ABLATION;  Surgeon:               Cindie Ole DASEN, MD;  Location: [facility] IN[facility] CV [facility];                Service: Cardiovascular;  Laterality: N/A; 04/02/2021: CARDIOVERSION; N/A     Comment:  Procedure: CARDIOVERSION;  Surgeon: Hobart Powell BRAVO, MD;  Location: [facility] ENDOSCOPY;  Service: Cardiovascular;              Laterality: N/A; 07/28/2021: CARDIOVERSION; N/A     Comment:  Procedure: CARDIOVERSION;  Surgeon: Alvan Ronal BRAVO, MD;               Location: [facility] ENDOSCOPY;   Service: Cardiovascular;                Laterality: N/A; 08/22/2023: CATARACT EXTRACTION W/ INTRAOCULAR LENS IMPLANT; Right 08/29/2023: CATARACT EXTRACTION W/ INTRAOCULAR LENS IMPLANT; Left 12/04/2019: COLONOSCOPY WITH PROPOFOL ; N/A     Comment:  Procedure: COLONOSCOPY WITH PROPOFOL ;  Surgeon: Dessa Reyes ORN, MD;  Location: [facility] ENDOSCOPY;  Service:               Endoscopy;  Laterality: N/A; 10/18/2005: CORONARY ANGIOPLASTY WITH STENT PLACEMENT; Left     Comment:  Procedure: CORONARY ANGIOPLASTY WITH STENT PLACEMENT;               Location: [facility]; Surgeon: Margie Lovelace, MD 02/24/2010: CORONARY ARTERY BYPASS GRAFT; N/A No date: HYSTERECTOMY, VAGINAL, WITH SALPINGO-OOPHORECTOMY; Right 10/17/2005: LEFT HEART CATH AND CORONARY ANGIOGRAPHY; Left     Comment:  Procedure: LEFT HEART CATH AND CORONARY ANGIOGRAPHY;               Location: [facility]; Surgeon: Margie Lovelace, MD 09/04/2020: SVT ABLATION; N/A     Comment:  Procedure: SVT ABLATION;  Surgeon: Cindie Ole DASEN,               MD;  Location: [facility];  Service:               Cardiovascular;  Laterality: N/A; No date: TONSILLECTOMY  BMI    Body Mass Index: 27.41 kg/m      Reproductive/Obstetrics negative OB ROS                              Anesthesia Physical Anesthesia Plan  ASA: 3  Anesthesia Plan: General ETT   Post-op Pain Management: Ofirmev  IV (intra-op)*, Toradol  IV (intra-op)*, Dilaudid  IV and Ketamine  IV*   Induction: Intravenous  PONV Risk Score and Plan: 3 and Ondansetron , Dexamethasone  and Treatment may vary due to age or medical condition  Airway Management Planned: Oral ETT  Additional Equipment:   Intra-op Plan:   Post-operative Plan: Extubation in OR  Informed Consent: I have reviewed the patients History and Physical, chart, labs and discussed the procedure  including the risks, benefits and alternatives for the proposed anesthesia with the patient  or authorized representative who has indicated his/her understanding and acceptance.     Dental Advisory Given  Plan Discussed with: Anesthesiologist, CRNA and Surgeon  Anesthesia Plan Comments: (Patient consented for risks of anesthesia including but not limited to:  - adverse reactions to medications - damage to eyes, teeth, lips or other oral mucosa - nerve damage due to positioning  - sore throat or hoarseness - Damage to heart, brain, nerves, lungs, other parts of body or loss of life  Patient voiced understanding and assent.)        Anesthesia Quick Evaluation

## 2023-11-03 NOTE — Discharge Instructions (Addendum)
 Post-Op Instructions - Rotator Cuff Repair  1. Bracing: You will wear a shoulder immobilizer or sling for 6 weeks.   2. Driving: No driving for 3 weeks post-op. When driving, do not wear the immobilizer. Ideally, we recommend no driving for 6 weeks while sling is in place as one arm will be immobilized.   3. Activity: No active lifting for 2 months. Wrist, hand, and elbow motion only. Avoid lifting the upper arm away from the body except for hygiene. You are permitted to bend and straighten the elbow passively only (no active elbow motion). You may use your hand and wrist for typing, writing, and managing utensils (cutting food). Do not lift more than a coffee cup for 8 weeks.  When sleeping or resting, inclined positions (recliner chair or wedge pillow) and a pillow under the forearm for support may provide better comfort for up to 4 weeks.  Avoid long distance travel for 4 weeks.  Return to normal activities after rotator cuff repair repair normally takes 6 months on average. If rehab goes very well, may be able to do most activities at 4 months, except overhead or contact sports.  4. Physical Therapy: Begins 3-4 days after surgery, and proceed 1 time per week for the first 6 weeks, then 1-2 times per week from weeks 6-20 post-op.  5. Medications:  - You will be provided a prescription for narcotic pain medicine. After surgery, take 1-3 narcotic tablets every 4 hours if needed for severe pain.  - A prescription for anti-nausea medication will be provided in case the narcotic medicine causes nausea - take 1 tablet every 6 hours only if nauseated.   - Take tylenol  1000 mg (2 Extra Strength tablets or 3 regular strength) every 8 hours for pain.  May decrease or stop tylenol  5 days after surgery if you are having minimal pain. - Resume home Eliquis  the day after surgery.   - DO NOT take ANY nonsteroidal anti-inflammatory pain medications (Advil, Motrin, Ibuprofen, Aleve, Naproxen, or Naprosyn). These  medicines can inhibit healing of your shoulder repair.    If you are taking prescription medication for anxiety, depression, insomnia, muscle spasm, chronic pain, or for attention deficit disorder, you are advised that you are at a higher risk of adverse effects with use of narcotics post-op, including narcotic addiction/dependence, depressed breathing, death. If you use non-prescribed substances: alcohol, marijuana, cocaine, heroin, methamphetamines, etc., you are at a higher risk of adverse effects with use of narcotics post-op, including narcotic addiction/dependence, depressed breathing, death. You are advised that taking > 50 morphine milligram equivalents (MME) of narcotic pain medication per day results in twice the risk of overdose or death. For your prescription provided: oxycodone  5 mg - taking more than 6 tablets per day would result in > 50 morphine milligram equivalents (MME) of narcotic pain medication. Be advised that we will prescribe narcotics short-term, for acute post-operative pain only - 3 weeks for major operations such as shoulder repair/reconstruction surgeries.     6. Post-Op Appointment:  Your first post-op appointment will be 10-14 days post-op.  7. Work or School: For most, but not all procedures, we advise staying out of work or school for at least 1 to 2 weeks in order to recover from the stress of surgery and to allow time for healing.   If you need a work or school note this can be provided.   8. Smoking: If you are a smoker, you need to refrain from smoking in the postoperative  period. The nicotine in cigarettes will inhibit healing of your shoulder repair and decrease the chance of successful repair. Similarly, nicotine containing products (gum, patches) should be avoided.   Post-operative Brace: Apply and remove the brace you received as you were instructed to at the time of fitting and as described in detail as the brace's instructions for use indicate.  Wear  the brace for the period of time prescribed by your physician.  The brace can be cleaned with soap and water and allowed to air dry only.  Should the brace result in increased pain, decreased feeling (numbness/tingling), increased swelling or an overall worsening of your medical condition, please contact your doctor immediately.  If an emergency situation occurs as a result of wearing the brace after normal business hours, please dial 911 and seek immediate medical attention.  Let your doctor know if you have any further questions about the brace issued to you. Refer to the shoulder sling instructions for use if you have any questions regarding the correct fit of your shoulder sling.  [facility] Customer Care for Troubleshooting: [phone]  Video that illustrates how to properly use a shoulder sling: Instructions for Proper Use of an Orthopaedic Sling http://bass.com/

## 2023-11-03 NOTE — H&P (Signed)
 Paper H&P to be scanned into permanent record. H&P reviewed. No significant changes noted.

## 2023-11-03 NOTE — Anesthesia Procedure Notes (Signed)
 Procedure Name: Intubation Date/Time: 11/03/2023 10:44 AM  Performed by: Veronica Alm BROCKS, CRNAPre-anesthesia Checklist: Patient identified, Emergency Drugs available, Suction available and Patient being monitored Patient Re-evaluated:Patient Re-evaluated prior to induction Oxygen Delivery Method: Circle system utilized Preoxygenation: Pre-oxygenation with 100% oxygen Induction Type: IV induction Ventilation: Mask ventilation without difficulty Laryngoscope Size: 3 and McGrath Grade View: Grade I Tube type: Oral Tube size: 7.0 mm Number of attempts: 1 Airway Equipment and Method: Stylet and Oral airway Placement Confirmation: ETT inserted through vocal cords under direct vision, positive ETCO2 and breath sounds checked- equal and bilateral Secured at: 21 cm Tube secured with: Tape Dental Injury: Teeth and Oropharynx as per pre-operative assessment

## 2023-11-03 NOTE — Op Note (Signed)
 SURGERY DATE: 11/03/2023   PRE-OP DIAGNOSIS:  1. Left subacromial impingement 2. Left biceps tendinopathy 3. Left rotator cuff tear 4. Left acromioclavicular joint arthritis  POST-OP DIAGNOSIS: 1. Left subacromial impingement 2. Left biceps tendinopathy 3. Left rotator cuff tear 4. Left acromioclavicular joint arthritis  PROCEDURES:  1. Left arthroscopic rotator cuff repair (full thickness subscapularis & supraspinatus) 2. Left arthroscopic biceps tenodesis 3. Left arthroscopic subacromial decompression 4. Left arthroscopic extensive debridement of shoulder (glenohumeral and subacromial spaces) 5. Left arthroscopic distal clavicle excision  SURGEON: Earnestine HILARIO Blanch, MD   ASSISTANT: DOROTHA Krystal Doyne, PA   ANESTHESIA: Gen with Exparel  interscalene block   ESTIMATED BLOOD LOSS: 5cc   DRAINS:  none   TOTAL IV FLUIDS: per anesthesia      SPECIMENS: none   IMPLANTS:  - Arthrex 2.41mm PushLock x 1 - Arthrex 4.31mm SwiveLock x 2 - Arthrex 5.17mm SwiveLock x 1 - Iconix SPEED double loaded with 1.2 and 2.0mm tape x 2     OPERATIVE FINDINGS:  Examination under anesthesia: A careful examination under anesthesia was performed.  Passive range of motion was: FF: 150; ER at side: 70; ER in abduction: 110; IR in abduction: 45.  Anterior load shift: NT.  Posterior load shift: NT.  Sulcus in neutral: NT.  Sulcus in ER: NT.     Intra-operative findings: A thorough arthroscopic examination of the shoulder was performed.  The findings are: 1. Biceps tendon: Severe tendinopathy with significant erythema and split thickness tendon with complete medial dislocation 2. Superior labrum: erythema 3. Posterior labrum and capsule: normal 4. Inferior capsule and inferior recess: normal 5. Glenoid cartilage surface: Normal 6. Supraspinatus attachment: full-thickness tear of the supraspinatus 7. Posterior rotator cuff attachment: normal 8. Humeral head articular cartilage: normal 9. Rotator interval:  significant synovitis 10: Subscapularis tendon: Full-thickness tear of the subscapularis 11. Anterior labrum: Mildly degenerative 12. IGHL: normal   OPERATIVE REPORT:    Indications for procedure:  Penny Reed is a 78 y.o. female with approximately 1 month of left shoulder pain after she had a fall.  She has been unable to lift her arm over her head since this time.  Prior to the injury, she had no significant dysfunction in the shoulder.  Clinical exam and MRI were suggestive of rotator cuff tear involving the supraspinatus and subscapularis, biceps tendon pathology, acromioclavicular joint arthritis and subacromial impingement. After discussion of risks, benefits, and alternatives to surgery, the patient elected to proceed.    Procedure in detail:   I identified Penny Reed in the pre-operative holding area.  I marked the operative shoulder with my initials. I reviewed the risks and benefits of the proposed surgical intervention, and the patient wished to proceed.  Anesthesia was then performed with an Exparel  interscalene block.  The patient was transferred to the operative suite and placed in the beach chair position.     Appropriate IV antibiotics were administered prior to incision. The operative upper extremity was then prepped and draped in standard fashion. A time out was performed confirming the correct extremity, correct patient, and correct procedure.    I then created a standard posterior portal with an 11 blade. The glenohumeral joint was easily entered with a blunt trocar and the arthroscope introduced. The findings of diagnostic arthroscopy are described above. I debrided degenerative tissue including the synovitic tissue about the rotator interval and anterior and superior labrum. I then coagulated the inflamed synovium to obtain hemostasis and reduce the risk of post-operative swelling  using an Architectural technologist device.   I then turned my attention to the  arthroscopic biceps tenodesis. The Loop n Tack technique was used to pass a FiberTape through the biceps in a locked fashion adjacent to the biceps anchor.  A hole for a 2.9 mm Arthrex PushLock was drilled in the bicipital groove just superior to the subscapularis tendon insertion.  The biceps tendon was then cut and the biceps anchor complex was debrided down to a stable base on the superior labrum.  The FiberTape was loaded onto the PushLock anchor and impacted into place into the previously drilled hole in the bicipital groove.  This appropriately secured the biceps into the bicipital groove and took it off of tension.    The subscapularis tear was identified.  A superior anterolateral portal was made under needle localization.  A cannula was placed.  The comma tissue indicating the superolateral border of the subscapularis was identified readily.  The tip of the coracoid as well as the conjoined tendon and coracoacromial ligaments were visualized after debriding rotator interval tissue.  Tissue about the subscapularis was released anteriorly, superiorly, and posteriorly to allow for improved mobilization.  The subscapularis had excellent mobility.  The comma tissue was tagged with a suture and tension on this allowed for appropriate mobilization of the subscapularis.  The lesser tuberosity footprint was prepared with a combination of electrocautery and burr. 2 suture tapes were passed in a mattress fashion.  The traction stitch was removed.  All 4 strands of suture were then loaded onto a 4.75 mm SwiveLock anchor and placed into the prepared footprint with the arm in a neutral position.  This construct appropriately reduced the subscapularis tear.  The arm was then internally and externally rotated and the subscapularis was noted to move appropriately with rotation.  The remainder of the suture was then cut.   Next, the arthroscope was then introduced into the subacromial space. A direct lateral portal was  created with an 11-blade after spinal needle localization. An extensive subacromial bursectomy and debridement was performed using a combination of the shaver and Arthrocare wand. The entire acromial undersurface was exposed and the CA ligament was subperiosteally elevated to expose the anterior acromial hook. A burr was used to create a flat anterior and lateral aspect of the acromion, converting it from a Type 2 to a Type 1 acromion. Care was made to keep the deltoid fascia intact.   I then turned my attention to the arthroscopic distal clavicle excision. I identified the acromioclavicular joint. Surrounding bursal tissue was debrided and the edges of the joint were identified. I used the 5.53mm barrel burr to remove the distal clavicle parallel to the edge of the acromion. I was able to fit two widths of the burr into the space between the distal clavicle and acromion, signifying that I had removed ~55mm of distal clavicle. This was confirmed by viewing anteriorly and introducing a probe with measuring marks from the lateral portal. Hemostasis was achieved with an Arthrocare wand.   Next, I created an accessory posterolateral portal to assist with visualization and instrumentation.  I debrided the poor quality edges of the supraspinatus tendon.  This was a U-shaped tear of the supraspinatus.  I prepared the footprint using a burr to expose bleeding bone.     I then percutaneously placed one Iconix SPEED medial row anchor along the anterior portion of the tear at the articular margin. Another SPEED anchor was placed along the posterior portion of the tear  at the articular margin. I then shuttled all the strands of tape (except for 1 set of posterior sutures given appropriate coverage without these sutures) through the rotator cuff just lateral to the musculotendinous junction using a FirstPass suture passer spanning the anterior to posterior extent of the tear. The posterior strands of each suture were passed  through an [employer] anchor.  This was placed approximately 2 cm distal to the lateral edge of the footprint in line with the posterior aspect of the tear with appropriate tensioning of each suture prior to final fixation.  Similarly, the anterior strands of each suture were passed through another SwiveLock anchor along the anterior margin of the tear.  Anterior anchor did not achieve good fixation so a 5.5 mm SwiveLock anchor was used.  This did achieve appropriate fixation.  This construct allowed for excellent reapproximation of the rotator cuff to its native footprint without undue tension.  Appropriate compression was achieved.  The repair was stable to external and internal rotation.   Fluid was evacuated from the shoulder, and the portals were closed with 3-0 Nylon. Xeroform was applied to the portals. A sterile dressing was applied, followed by a Polar Care sleeve and a SlingShot shoulder immobilizer/sling. The patient was awakened from anesthesia without difficulty and was transferred to the PACU in stable condition.    Of note, assistance from a PA was essential to performing the surgery.  PA was present for the entire surgery.  PA assisted with patient positioning, retraction, instrumentation, and wound closure. The surgery would have been more difficult and had longer operative time without PA assistance.   COMPLICATIONS: none   DISPOSITION: plan for discharge home after recovery in PACU     POSTOPERATIVE PLAN: Remain in sling (except hygiene and elbow/wrist/hand RoM exercises as instructed by PT) x 6 weeks and NWB for this time. PT to begin 3-4 days after surgery.  Large rotator cuff repair rehab protocol with subscapularis restrictions. ASA 325mg  daily x 2 weeks for DVT ppx.

## 2023-11-03 NOTE — Transfer of Care (Signed)
 Immediate Anesthesia Transfer of Care Note  Patient: Penny Reed  Procedure(s) Performed: ARTHROSCOPY, SHOULDER, WITH ROTATOR CUFF REPAIR (Left: Shoulder) DECOMPRESSION, SUBACROMIAL SPACE (Left: Shoulder) TENODESIS, BICEPS (Left: Shoulder)  Patient Location: PACU  Anesthesia Type:General  Level of Consciousness: awake, alert , oriented, and patient cooperative  Airway & Oxygen Therapy: Patient Spontanous Breathing and Patient connected to face mask oxygen  Post-op Assessment: Report given to RN and Post -op Vital signs reviewed and stable  Post vital signs: Reviewed and stable  Last Vitals:  Vitals Value Taken Time  BP 114/36 11/03/23 14:08  Temp 36 C 11/03/23 14:08  Pulse 44 11/03/23 14:09  Resp 7 11/03/23 14:09  SpO2 100 % 11/03/23 14:09  Vitals shown include unfiled device data.  Last Pain:  Vitals:   11/03/23 1408  PainSc: 0-No pain         Complications: No notable events documented.

## 2023-11-03 NOTE — Anesthesia Postprocedure Evaluation (Signed)
 Anesthesia Post Note  Patient: Penny Reed  Procedure(s) Performed: ARTHROSCOPY, SHOULDER, WITH ROTATOR CUFF REPAIR (Left: Shoulder) DECOMPRESSION, SUBACROMIAL SPACE (Left: Shoulder) TENODESIS, BICEPS (Left: Shoulder)  Patient location during evaluation: Endoscopy Anesthesia Type: General Level of consciousness: awake and alert Pain management: pain level controlled Vital Signs Assessment: post-procedure vital signs reviewed and stable Respiratory status: spontaneous breathing, nonlabored ventilation, respiratory function stable and patient connected to nasal cannula oxygen Cardiovascular status: blood pressure returned to baseline and stable Postop Assessment: no apparent nausea or vomiting Anesthetic complications: no   No notable events documented.   Last Vitals:  Vitals:   11/03/23 1515 11/03/23 1530  BP: (!) 155/52 (!) 145/57  Pulse: (!) 49 (!) 50  Resp: 11 17  Temp:    SpO2: 99% 91%    Last Pain:  Vitals:   11/03/23 1530  PainSc: Asleep                 [name]

## 2023-11-03 NOTE — Progress Notes (Signed)
 Pt states she is at a tolerable pain level at this moment, 6/10

## 2023-11-06 ENCOUNTER — Encounter: Payer: Self-pay | Admitting: Orthopedic Surgery

## 2023-11-08 DIAGNOSIS — G8929 Other chronic pain: Secondary | ICD-10-CM | POA: Diagnosis not present

## 2023-11-08 DIAGNOSIS — M25512 Pain in left shoulder: Secondary | ICD-10-CM | POA: Diagnosis not present

## 2023-11-08 DIAGNOSIS — M25612 Stiffness of left shoulder, not elsewhere classified: Secondary | ICD-10-CM | POA: Diagnosis not present

## 2023-11-08 DIAGNOSIS — Z9889 Other specified postprocedural states: Secondary | ICD-10-CM | POA: Diagnosis not present

## 2023-11-08 DIAGNOSIS — R531 Weakness: Secondary | ICD-10-CM | POA: Diagnosis not present

## 2023-11-15 DIAGNOSIS — Z9889 Other specified postprocedural states: Secondary | ICD-10-CM | POA: Diagnosis not present

## 2023-11-15 DIAGNOSIS — M25612 Stiffness of left shoulder, not elsewhere classified: Secondary | ICD-10-CM | POA: Diagnosis not present

## 2023-11-17 ENCOUNTER — Encounter: Payer: Self-pay | Admitting: Orthopedic Surgery

## 2023-11-22 DIAGNOSIS — Z9889 Other specified postprocedural states: Secondary | ICD-10-CM | POA: Diagnosis not present

## 2023-11-22 DIAGNOSIS — M25512 Pain in left shoulder: Secondary | ICD-10-CM | POA: Diagnosis not present

## 2023-11-30 DIAGNOSIS — M25512 Pain in left shoulder: Secondary | ICD-10-CM | POA: Diagnosis not present

## 2023-11-30 DIAGNOSIS — Z9889 Other specified postprocedural states: Secondary | ICD-10-CM | POA: Diagnosis not present

## 2023-11-30 DIAGNOSIS — M25612 Stiffness of left shoulder, not elsewhere classified: Secondary | ICD-10-CM | POA: Diagnosis not present

## 2023-11-30 DIAGNOSIS — G8929 Other chronic pain: Secondary | ICD-10-CM | POA: Diagnosis not present

## 2023-12-07 DIAGNOSIS — G8929 Other chronic pain: Secondary | ICD-10-CM | POA: Diagnosis not present

## 2023-12-07 DIAGNOSIS — M25612 Stiffness of left shoulder, not elsewhere classified: Secondary | ICD-10-CM | POA: Diagnosis not present

## 2023-12-07 DIAGNOSIS — M25512 Pain in left shoulder: Secondary | ICD-10-CM | POA: Diagnosis not present

## 2023-12-07 DIAGNOSIS — Z9889 Other specified postprocedural states: Secondary | ICD-10-CM | POA: Diagnosis not present

## 2023-12-07 DIAGNOSIS — R531 Weakness: Secondary | ICD-10-CM | POA: Diagnosis not present

## 2023-12-14 DIAGNOSIS — Z9889 Other specified postprocedural states: Secondary | ICD-10-CM | POA: Diagnosis not present

## 2023-12-14 DIAGNOSIS — G8929 Other chronic pain: Secondary | ICD-10-CM | POA: Diagnosis not present

## 2023-12-14 DIAGNOSIS — M25512 Pain in left shoulder: Secondary | ICD-10-CM | POA: Diagnosis not present

## 2023-12-14 DIAGNOSIS — R531 Weakness: Secondary | ICD-10-CM | POA: Diagnosis not present

## 2023-12-14 DIAGNOSIS — M25612 Stiffness of left shoulder, not elsewhere classified: Secondary | ICD-10-CM | POA: Diagnosis not present

## 2023-12-20 NOTE — Progress Notes (Deleted)
 Electrophysiology Clinic Note    Date:  12/20/2023  Patient ID:  Penny Reed Nov 10, 1945, MRN [id number] PCP:  Bertrum Charlie CROME, MD  Cardiologist:  Cara JONETTA Lovelace, MD   Electrophysiologist:  OLE ONEIDA HOLTS, MD    ***refresh  Discussed the use of AI scribe software for clinical note transcription with the patient, who gave verbal consent to proceed.   Patient Profile    Chief Complaint: AF ablation follow-up  History of Present Illness: Penny Reed is a 78 y.o. female with PMH notable for persis AFib, atypical aflutter, CAD s/p CABG, COPD, HTN, PAD, h/o DVT, dilated ascending aorta ; seen today for OLE ONEIDA HOLTS, MD for routine electrophysiology follow-up s/p AF Ablation.  She is s/p AF ablation with isolation of pulm veins, posterior wall, and CTI and perimitral line for flutter on 05/2021 by Dr. HOLTS.  She had recurrence of AFib and is now s/p redo AF ablation with re-isolation of PVI, redo posterior wall on 09/19/2023.   I saw her 09/2023 for routine 1 month post-ablation appt where she was in asymptomatic junctional rhythm, no AFib since ablation. Amiodarone  stopped. She had fall at home requiring rotator cuff surgery.   On follow-up today,. *** AF burden, symptoms *** palpitations *** bleeding concerns   - when seeing [facility] again?   Since last being seen in our clinic the patient reports doing ***.  she denies chest pain, palpitations, dyspnea, PND, orthopnea, nausea, vomiting, dizziness, syncope, edema, weight gain, or early satiety.       Arrhythmia/Device History Amiodarone     ROS:  Please see the history of present illness. All other systems are reviewed and otherwise negative.    Physical Exam    VS:  There were no vitals taken for this visit. BMI: There is no height or weight on file to calculate BMI.      Wt Readings from Last 3 Encounters:  11/03/23 175 lb (79.4 kg)  11/01/23 175 lb (79.4 kg)  10/17/23 178 lb 4  oz (80.9 kg)     GEN- The patient is well appearing, alert and oriented x 3 today.   Lungs- Clear to ausculation bilaterally, normal work of breathing.  Heart- {Blank single:19197::Regular,Irregularly irregular} rate and rhythm, no murmurs, rubs or gallops Extremities- {EDEMA LEVEL:28147::No} peripheral edema, warm, dry    Studies Reviewed   Previous EP, cardiology notes.    EKG is ordered. Personal review of EKG from today shows:  ***        TTE, 04/11/2023  1. Left ventricular ejection fraction, by estimation, is 55 to 60%. The left ventricle has normal function. The left ventricle demonstrates regional wall motion abnormalities (focal region of severe hypokinesis with thinning of the myocardium in the mid  septal wall). There is moderate asymmetric left ventricular hypertrophy of the basal-septal segment. Left ventricular diastolic parameters are indeterminate. The average left ventricular global longitudinal strain is -20.8 %.   2. Right ventricular systolic function is normal. The right ventricular size is normal. Tricuspid regurgitation signal is inadequate for assessing PA pressure.   3. Left atrial size was severely dilated.   4. The mitral valve is normal in structure. Mild to moderate mitral valve regurgitation. No evidence of mitral stenosis. Moderate mitral annular calcification.   5. The aortic valve has an indeterminant number of cusps. Aortic valve regurgitation is moderate. Aortic valve sclerosis is present, with no evidence of aortic valve stenosis.   6. There is mild dilatation of  the ascending aorta, measuring 42 mm.   7. The inferior vena cava is normal in size with greater than 50% respiratory variability, suggesting right atrial pressure of 3 mmHg.    Assessment and Plan     #) persis AFib #) aflutter S/p AFib, aflutter ablation 2023 S/p redo afib ablation 08/2023 Recently stopped amiodarone    #) Hypercoag d/t afib CHA2DS2-VASc Score = at least 6  [CHF History: 1, HTN History: 1, Diabetes History: 0, Stroke History: 0, Vascular Disease History: 1, Age Score: 2, Gender Score: 1].  Therefore, the patient's annual risk of stroke is 9.7 %.     {Confirm score is correct.  If not, click here to update score.  REFRESH note.  :1}   Stroke ppx - 20mg  xarelto , appropriately dosed for CrCL > 51ml/min No bleeding concerns    #) ***   {Are you ordering a CV Procedure (e.g. stress test, cath, DCCV, TEE, etc)?   Press F2        :[id number]}   Current medicines are reviewed at length with the patient today.   The patient {ACTIONS; HAS/DOES NOT HAVE:19233} concerns regarding her medicines.  The following changes were made today:  {NONE DEFAULTED:18576}  Labs/ tests ordered today include: *** No orders of the defined types were placed in this encounter.    Disposition: Follow up with {EPMDS:28135::EP Team} or EP APP {EPFOLLOW UP:28173}   Signed, Chantal Needle, NP  12/20/23  9:44 AM  Electrophysiology [facility]

## 2023-12-21 ENCOUNTER — Ambulatory Visit: Attending: Cardiology | Admitting: Cardiology

## 2023-12-21 DIAGNOSIS — I4819 Other persistent atrial fibrillation: Secondary | ICD-10-CM

## 2023-12-21 DIAGNOSIS — I484 Atypical atrial flutter: Secondary | ICD-10-CM

## 2023-12-26 DIAGNOSIS — M25612 Stiffness of left shoulder, not elsewhere classified: Secondary | ICD-10-CM | POA: Diagnosis not present

## 2023-12-26 DIAGNOSIS — G8929 Other chronic pain: Secondary | ICD-10-CM | POA: Diagnosis not present

## 2023-12-26 DIAGNOSIS — M25512 Pain in left shoulder: Secondary | ICD-10-CM | POA: Diagnosis not present

## 2023-12-26 DIAGNOSIS — R531 Weakness: Secondary | ICD-10-CM | POA: Diagnosis not present

## 2023-12-26 DIAGNOSIS — Z9889 Other specified postprocedural states: Secondary | ICD-10-CM | POA: Diagnosis not present

## 2023-12-28 DIAGNOSIS — M25512 Pain in left shoulder: Secondary | ICD-10-CM | POA: Diagnosis not present

## 2023-12-28 DIAGNOSIS — R531 Weakness: Secondary | ICD-10-CM | POA: Diagnosis not present

## 2023-12-28 DIAGNOSIS — Z9889 Other specified postprocedural states: Secondary | ICD-10-CM | POA: Diagnosis not present

## 2023-12-28 DIAGNOSIS — M25612 Stiffness of left shoulder, not elsewhere classified: Secondary | ICD-10-CM | POA: Diagnosis not present

## 2023-12-28 DIAGNOSIS — G8929 Other chronic pain: Secondary | ICD-10-CM | POA: Diagnosis not present

## 2024-01-02 DIAGNOSIS — Z9889 Other specified postprocedural states: Secondary | ICD-10-CM | POA: Diagnosis not present

## 2024-01-02 DIAGNOSIS — M25512 Pain in left shoulder: Secondary | ICD-10-CM | POA: Diagnosis not present

## 2024-01-05 DIAGNOSIS — M25512 Pain in left shoulder: Secondary | ICD-10-CM | POA: Diagnosis not present

## 2024-01-05 DIAGNOSIS — Z9889 Other specified postprocedural states: Secondary | ICD-10-CM | POA: Diagnosis not present

## 2024-01-09 DIAGNOSIS — Z9889 Other specified postprocedural states: Secondary | ICD-10-CM | POA: Diagnosis not present

## 2024-01-09 DIAGNOSIS — R531 Weakness: Secondary | ICD-10-CM | POA: Diagnosis not present

## 2024-01-09 DIAGNOSIS — M25512 Pain in left shoulder: Secondary | ICD-10-CM | POA: Diagnosis not present

## 2024-01-09 DIAGNOSIS — M25612 Stiffness of left shoulder, not elsewhere classified: Secondary | ICD-10-CM | POA: Diagnosis not present

## 2024-01-09 DIAGNOSIS — G8929 Other chronic pain: Secondary | ICD-10-CM | POA: Diagnosis not present

## 2024-01-11 DIAGNOSIS — M25612 Stiffness of left shoulder, not elsewhere classified: Secondary | ICD-10-CM | POA: Diagnosis not present

## 2024-01-11 DIAGNOSIS — Z9889 Other specified postprocedural states: Secondary | ICD-10-CM | POA: Diagnosis not present

## 2024-01-11 DIAGNOSIS — M25512 Pain in left shoulder: Secondary | ICD-10-CM | POA: Diagnosis not present

## 2024-01-11 DIAGNOSIS — R531 Weakness: Secondary | ICD-10-CM | POA: Diagnosis not present

## 2024-01-11 DIAGNOSIS — G8929 Other chronic pain: Secondary | ICD-10-CM | POA: Diagnosis not present

## 2024-01-15 DIAGNOSIS — M25612 Stiffness of left shoulder, not elsewhere classified: Secondary | ICD-10-CM | POA: Diagnosis not present

## 2024-01-15 DIAGNOSIS — M25512 Pain in left shoulder: Secondary | ICD-10-CM | POA: Diagnosis not present

## 2024-01-15 DIAGNOSIS — R531 Weakness: Secondary | ICD-10-CM | POA: Diagnosis not present

## 2024-01-15 DIAGNOSIS — G8929 Other chronic pain: Secondary | ICD-10-CM | POA: Diagnosis not present

## 2024-01-15 DIAGNOSIS — Z9889 Other specified postprocedural states: Secondary | ICD-10-CM | POA: Diagnosis not present

## 2024-01-19 DIAGNOSIS — R531 Weakness: Secondary | ICD-10-CM | POA: Diagnosis not present

## 2024-01-19 DIAGNOSIS — G8929 Other chronic pain: Secondary | ICD-10-CM | POA: Diagnosis not present

## 2024-01-19 DIAGNOSIS — M25612 Stiffness of left shoulder, not elsewhere classified: Secondary | ICD-10-CM | POA: Diagnosis not present

## 2024-01-19 DIAGNOSIS — M25512 Pain in left shoulder: Secondary | ICD-10-CM | POA: Diagnosis not present

## 2024-01-19 DIAGNOSIS — Z9889 Other specified postprocedural states: Secondary | ICD-10-CM | POA: Diagnosis not present

## 2024-01-23 DIAGNOSIS — M25512 Pain in left shoulder: Secondary | ICD-10-CM | POA: Diagnosis not present

## 2024-01-23 DIAGNOSIS — Z9889 Other specified postprocedural states: Secondary | ICD-10-CM | POA: Diagnosis not present

## 2024-01-23 DIAGNOSIS — R531 Weakness: Secondary | ICD-10-CM | POA: Diagnosis not present

## 2024-01-23 DIAGNOSIS — M25612 Stiffness of left shoulder, not elsewhere classified: Secondary | ICD-10-CM | POA: Diagnosis not present

## 2024-01-23 DIAGNOSIS — G8929 Other chronic pain: Secondary | ICD-10-CM | POA: Diagnosis not present

## 2024-01-25 DIAGNOSIS — R531 Weakness: Secondary | ICD-10-CM | POA: Diagnosis not present

## 2024-01-25 DIAGNOSIS — G8929 Other chronic pain: Secondary | ICD-10-CM | POA: Diagnosis not present

## 2024-01-25 DIAGNOSIS — M25512 Pain in left shoulder: Secondary | ICD-10-CM | POA: Diagnosis not present

## 2024-01-25 DIAGNOSIS — M25612 Stiffness of left shoulder, not elsewhere classified: Secondary | ICD-10-CM | POA: Diagnosis not present

## 2024-01-25 DIAGNOSIS — Z9889 Other specified postprocedural states: Secondary | ICD-10-CM | POA: Diagnosis not present

## 2024-01-30 DIAGNOSIS — M25512 Pain in left shoulder: Secondary | ICD-10-CM | POA: Diagnosis not present

## 2024-01-30 DIAGNOSIS — Z9889 Other specified postprocedural states: Secondary | ICD-10-CM | POA: Diagnosis not present

## 2024-02-01 DIAGNOSIS — M25512 Pain in left shoulder: Secondary | ICD-10-CM | POA: Diagnosis not present

## 2024-02-01 DIAGNOSIS — Z9889 Other specified postprocedural states: Secondary | ICD-10-CM | POA: Diagnosis not present

## 2024-02-06 DIAGNOSIS — H903 Sensorineural hearing loss, bilateral: Secondary | ICD-10-CM | POA: Diagnosis not present

## 2024-02-06 DIAGNOSIS — Z9889 Other specified postprocedural states: Secondary | ICD-10-CM | POA: Diagnosis not present

## 2024-02-06 DIAGNOSIS — M25512 Pain in left shoulder: Secondary | ICD-10-CM | POA: Diagnosis not present

## 2024-02-06 DIAGNOSIS — H6121 Impacted cerumen, right ear: Secondary | ICD-10-CM | POA: Diagnosis not present

## 2024-02-13 DIAGNOSIS — M25612 Stiffness of left shoulder, not elsewhere classified: Secondary | ICD-10-CM | POA: Diagnosis not present

## 2024-02-13 DIAGNOSIS — M25512 Pain in left shoulder: Secondary | ICD-10-CM | POA: Diagnosis not present

## 2024-02-13 DIAGNOSIS — G8929 Other chronic pain: Secondary | ICD-10-CM | POA: Diagnosis not present

## 2024-02-13 DIAGNOSIS — R531 Weakness: Secondary | ICD-10-CM | POA: Diagnosis not present

## 2024-02-13 DIAGNOSIS — Z9889 Other specified postprocedural states: Secondary | ICD-10-CM | POA: Diagnosis not present

## 2024-02-20 DIAGNOSIS — M25512 Pain in left shoulder: Secondary | ICD-10-CM | POA: Diagnosis not present

## 2024-02-20 DIAGNOSIS — Z9889 Other specified postprocedural states: Secondary | ICD-10-CM | POA: Diagnosis not present

## 2024-02-20 DIAGNOSIS — R531 Weakness: Secondary | ICD-10-CM | POA: Diagnosis not present

## 2024-02-20 DIAGNOSIS — M25612 Stiffness of left shoulder, not elsewhere classified: Secondary | ICD-10-CM | POA: Diagnosis not present

## 2024-02-20 DIAGNOSIS — G8929 Other chronic pain: Secondary | ICD-10-CM | POA: Diagnosis not present

## 2024-02-27 DIAGNOSIS — M25512 Pain in left shoulder: Secondary | ICD-10-CM | POA: Diagnosis not present

## 2024-02-27 DIAGNOSIS — Z9889 Other specified postprocedural states: Secondary | ICD-10-CM | POA: Diagnosis not present

## 2024-03-05 DIAGNOSIS — M25512 Pain in left shoulder: Secondary | ICD-10-CM | POA: Diagnosis not present

## 2024-03-05 DIAGNOSIS — Z9889 Other specified postprocedural states: Secondary | ICD-10-CM | POA: Diagnosis not present

## 2024-03-15 DIAGNOSIS — Z9889 Other specified postprocedural states: Secondary | ICD-10-CM | POA: Diagnosis not present

## 2024-03-15 DIAGNOSIS — M25612 Stiffness of left shoulder, not elsewhere classified: Secondary | ICD-10-CM | POA: Diagnosis not present

## 2024-03-15 DIAGNOSIS — G8929 Other chronic pain: Secondary | ICD-10-CM | POA: Diagnosis not present

## 2024-03-15 DIAGNOSIS — M25512 Pain in left shoulder: Secondary | ICD-10-CM | POA: Diagnosis not present

## 2024-03-15 DIAGNOSIS — R531 Weakness: Secondary | ICD-10-CM | POA: Diagnosis not present

## 2024-03-28 DIAGNOSIS — M25512 Pain in left shoulder: Secondary | ICD-10-CM | POA: Diagnosis not present

## 2024-03-28 DIAGNOSIS — Z9889 Other specified postprocedural states: Secondary | ICD-10-CM | POA: Diagnosis not present

## 2024-04-09 ENCOUNTER — Encounter (INDEPENDENT_AMBULATORY_CARE_PROVIDER_SITE_OTHER)

## 2024-04-09 ENCOUNTER — Ambulatory Visit (INDEPENDENT_AMBULATORY_CARE_PROVIDER_SITE_OTHER): Admitting: Vascular Surgery

## 2024-04-10 DIAGNOSIS — S46012D Strain of muscle(s) and tendon(s) of the rotator cuff of left shoulder, subsequent encounter: Secondary | ICD-10-CM | POA: Diagnosis not present

## 2024-04-15 DIAGNOSIS — R000 Tachycardia, unspecified: Secondary | ICD-10-CM | POA: Diagnosis not present

## 2024-04-15 DIAGNOSIS — R001 Bradycardia, unspecified: Secondary | ICD-10-CM | POA: Diagnosis not present

## 2024-04-15 DIAGNOSIS — J449 Chronic obstructive pulmonary disease, unspecified: Secondary | ICD-10-CM | POA: Diagnosis not present

## 2024-04-15 DIAGNOSIS — I2510 Atherosclerotic heart disease of native coronary artery without angina pectoris: Secondary | ICD-10-CM | POA: Diagnosis not present

## 2024-04-15 DIAGNOSIS — K219 Gastro-esophageal reflux disease without esophagitis: Secondary | ICD-10-CM | POA: Diagnosis not present

## 2024-04-15 DIAGNOSIS — Z951 Presence of aortocoronary bypass graft: Secondary | ICD-10-CM | POA: Diagnosis not present

## 2024-04-15 DIAGNOSIS — I739 Peripheral vascular disease, unspecified: Secondary | ICD-10-CM | POA: Diagnosis not present

## 2024-04-15 DIAGNOSIS — I10 Essential (primary) hypertension: Secondary | ICD-10-CM | POA: Diagnosis not present

## 2024-04-15 DIAGNOSIS — I5032 Chronic diastolic (congestive) heart failure: Secondary | ICD-10-CM | POA: Diagnosis not present

## 2024-04-15 DIAGNOSIS — I480 Paroxysmal atrial fibrillation: Secondary | ICD-10-CM | POA: Diagnosis not present

## 2024-04-15 DIAGNOSIS — Z87891 Personal history of nicotine dependence: Secondary | ICD-10-CM | POA: Diagnosis not present

## 2024-04-15 DIAGNOSIS — M25473 Effusion, unspecified ankle: Secondary | ICD-10-CM | POA: Diagnosis not present

## 2024-05-01 ENCOUNTER — Ambulatory Visit (INDEPENDENT_AMBULATORY_CARE_PROVIDER_SITE_OTHER): Admitting: Nurse Practitioner

## 2024-05-01 ENCOUNTER — Encounter (INDEPENDENT_AMBULATORY_CARE_PROVIDER_SITE_OTHER)

## 2024-05-08 ENCOUNTER — Other Ambulatory Visit (INDEPENDENT_AMBULATORY_CARE_PROVIDER_SITE_OTHER)

## 2024-05-08 ENCOUNTER — Encounter (INDEPENDENT_AMBULATORY_CARE_PROVIDER_SITE_OTHER): Payer: Self-pay | Admitting: Nurse Practitioner

## 2024-05-08 ENCOUNTER — Ambulatory Visit (INDEPENDENT_AMBULATORY_CARE_PROVIDER_SITE_OTHER): Admitting: Nurse Practitioner

## 2024-05-08 VITALS — BP 147/74 | HR 49 | Resp 16 | Ht 67.0 in | Wt 176.4 lb

## 2024-05-08 DIAGNOSIS — I10 Essential (primary) hypertension: Secondary | ICD-10-CM

## 2024-05-08 DIAGNOSIS — I70213 Atherosclerosis of native arteries of extremities with intermittent claudication, bilateral legs: Secondary | ICD-10-CM

## 2024-05-08 DIAGNOSIS — E7800 Pure hypercholesterolemia, unspecified: Secondary | ICD-10-CM | POA: Diagnosis not present

## 2024-05-09 LAB — VAS US ABI WITH/WO TBI
Left ABI: 0.8
Right ABI: 1

## 2024-05-12 ENCOUNTER — Encounter (INDEPENDENT_AMBULATORY_CARE_PROVIDER_SITE_OTHER): Payer: Self-pay | Admitting: Nurse Practitioner

## 2024-05-12 NOTE — Progress Notes (Signed)
 "  Subjective:    Patient ID: Penny Reed, female    DOB: 1945-05-24, 79 y.o.   MRN: [id number] Chief Complaint  Patient presents with   Follow-up    fu 6 months + ABI see JD/FB    HPI  Discussed the use of AI scribe software for clinical note transcription with the patient, who gave verbal consent to proceed.  History of Present Illness Penny Reed is a 79 year old female with peripheral artery disease who presents for follow-up of bilateral lower extremity claudication.  Over the past six months, she has noted improvement in bilateral lower extremity claudication and increased activity tolerance. She is able to walk on a treadmill at 2.5 mph with a 3% incline for at least 30 minutes daily, typically covering more than a mile without significant difficulty. She also ambulates on a wooded trail for approximately 45 minutes before requiring rest. She performs daily activities, including grocery shopping, yard work, and attending events, without significant limitation. She experiences mild leg pain only after extended walking distances, with no pain at rest or during routine activities.  She denies wounds, ulcers, or non-healing injuries on her lower extremities. She has no lower extremity or pedal edema. She has not experienced sudden changes in her legs, including coldness, numbness, or acute pain, and has not noticed any significant decrease in walking distance.  She has incorporated regular treadmill and elliptical exercise into her daily routine since December, completing 30 minutes on each device. She attempts to wear compression socks but finds them difficult to apply due to tightness, not swelling. She inquired about leg compression devices for circulation at a local spa but has not used them regularly. She is planning an [place] trip next summer.    Results Diagnostic Ankle-brachial index, right leg (05/08/2024): 1.00, improved from 0.88 in 09/2023 Ankle-brachial index,  left leg (05/08/2024): 0.80, improved from 0.71 in 09/2023 Toe-brachial index, right toe (05/08/2024): 0.88, improved from 0.61 in 09/2023 Toe-brachial index, left toe (05/08/2024): 0.81, improved from 0.52 in 09/2023 Lower extremity arterial waveforms (05/08/2024): Biphasic waveforms, consistent with peripheral arterial disease and adequate perfusion   Review of Systems  Skin:  Negative for wound.  All other systems reviewed and are negative.      Objective:   Physical Exam Vitals reviewed.  HENT:     Head: Normocephalic.  Cardiovascular:     Rate and Rhythm: Normal rate and regular rhythm.     Pulses:          Dorsalis pedis pulses are detected w/ Doppler on the right side and detected w/ Doppler on the left side.       Posterior tibial pulses are detected w/ Doppler on the right side and detected w/ Doppler on the left side.  Pulmonary:     Effort: Pulmonary effort is normal.  Skin:    General: Skin is warm and dry.  Neurological:     Mental Status: She is alert and oriented to person, place, and time.  Psychiatric:        Mood and Affect: Mood normal.        Behavior: Behavior normal.        Thought Content: Thought content normal.        Judgment: Judgment normal.     Physical Exam    BP (!) 147/74   Pulse (!) 49   Resp 16   Ht 5' 7 (1.702 m)   Wt 176 lb 6.4 oz (80 kg)  BMI 27.63 kg/m   Past Medical History:  Diagnosis Date   (HFpEF) heart failure with preserved ejection fraction (HCC)    Acquired hypothyroidism    Allergic rhinitis    Angina pectoris    Aortic atherosclerosis    Ascending aortic aneurysm    Atrial fibrillation and flutter (HCC)    a.) CHA2DS2VASc = 6 (age x 2, sex, HFpEF, HTN, prior MI/vascular disease) as of 11/01/2023; b.) s/p ablation 09/04/2020; c.) s/p DCCV (150 J x 1) 04/02/2021; d.) s/p ablation 05/31/2021; e.) s/p DCCV (200 J x 1) 07/28/2021; f.) s/p ablation 09/19/2023; g.) rate/rhythm maintained on oral metoprolol  succinate;  chronically anticoagulated with rivaroxaban    Bradycardia    CAD (coronary artery disease)    Cardiomegaly    Complete tear of left rotator cuff    Complication of anesthesia    a.) premature emergence during routine colonoscopy   COPD (chronic obstructive pulmonary disease) (HCC)    Dilation of descending aorta    DJD (degenerative joint disease)    Essential hypertension    Former smoker    GERD (gastroesophageal reflux disease)    Hyperlipidemia    Insomnia    a.) on hypnotic PRN (zolpidem )   On rivaroxaban  therapy    OSA (obstructive sleep apnea)    a.) no nocturnal PAP therapy; utilizes mandubular advancement device (dental device)   Peripheral edema    Peripheral vascular disease    S/P CABG x 2 02/24/2010   a.) LIMA-LAD, SVG-OM1   Septal myocardial infarction (HCC) 10/17/2005   a.) PCI 10/18/2005: 75% mLAD (3.0 x 18 mm Cypher DES). 95% mLAD (3.0 x 18 mm Cypher DES), 75% dLAD (2.5 x 18 mm Cypher DES), 80% OM1 (3.0 x 13 mm Cypher DES)   Status post bilateral cataract extraction (pseudophakia of both eyes)     Social History   Socioeconomic History   Marital status: Married    Spouse name: Oneil   Number of children: 3   Years of education: Not on file   Highest education level: Associate degree: academic program  Occupational History   Not on file  Tobacco Use   Smoking status: Former    Current packs/day: 0.00    Average packs/day: 0.3 packs/day for 25.0 years (6.3 ttl pk-yrs)    Types: Cigarettes    Start date: 04/25/1980    Quit date: 04/25/2005    Years since quitting: 19.0   Smokeless tobacco: Never   Tobacco comments:    Former smoker 06/28/21  Vaping Use   Vaping status: Never Used  Substance and Sexual Activity   Alcohol use: Yes    Alcohol/week: 6.0 - 14.0 standard drinks of alcohol    Types: 6 - 14 Glasses of wine per week    Comment: 2 glasses in a setting over 3-7 days a week 06/28/21   Drug use: No   Sexual activity: Not on file  Other Topics  Concern   Not on file  Social History Narrative   Not on file   Social Drivers of Health   Tobacco Use: Medium Risk (05/12/2024)   Patient History    Smoking Tobacco Use: Former    Smokeless Tobacco Use: Never    Passive Exposure: Not on file  Financial Resource Strain: Low Risk  (04/15/2024)   Received from [facility]   Overall Financial Resource Strain (CARDIA)    Difficulty of Paying Living Expenses: Not hard at all  Food Insecurity: No Food Insecurity (04/15/2024)  Received from [facility]   Epic    Within the past 12 months, you worried that your food would run out before you got the money to buy more.: Never true    Within the past 12 months, the food you bought just didn't last and you didn't have money to get more.: Never true  Transportation Needs: No Transportation Needs (04/15/2024)   Received from [facility] - Transportation    In the past 12 months, has lack of transportation kept you from medical appointments or from getting medications?: No    Lack of Transportation (Non-Medical): No  Physical Activity: Sufficiently Active (01/16/2024)   Received from [facility]   Exercise Vital Sign    On average, how many days per week do you engage in moderate to strenuous exercise (like a brisk walk)?: 5 days    On average, how many minutes do you engage in exercise at this level?: 30 min  Stress: No Stress Concern Present (10/04/2022)   [employer] - Occupational Stress Questionnaire    Feeling of Stress : Not at all  Social Connections: Moderately Integrated (10/04/2022)   Social Connection and Isolation Panel    Frequency of Communication with Friends and Family: More than three times a week    Frequency of Social Gatherings with Friends and Family: Three times a week    Attends Religious Services: Never    Active Member of Clubs or Organizations: Yes    Attends  Banker Meetings: More than 4 times per year    Marital Status: Married  Catering Manager Violence: Not At Risk (09/27/2022)   Humiliation, Afraid, Rape, and Kick questionnaire    Fear of Current or Ex-Partner: No    Emotionally Abused: No    Physically Abused: No    Sexually Abused: No  Depression (PHQ2-9): Low Risk (10/05/2022)   Depression (PHQ2-9)    PHQ-2 Score: 4  Alcohol Screen: Low Risk (10/05/2022)   Alcohol Screen    Last Alcohol Screening Score (AUDIT): 4  Housing: Low Risk  (04/15/2024)   Received from [facility]   Epic    In the last 12 months, was there a time when you were not able to pay the mortgage or rent on time?: No    In the past 12 months, how many times have you moved where you were living?: 0    At any time in the past 12 months, were you homeless or living in a shelter (including now)?: No  Utilities: Not At Risk (04/15/2024)   Received from [facility]   Epic    In the past 12 months has the electric, gas, oil, or water company threatened to shut off services in your home?: No  Health Literacy: Adequate Health Literacy (01/16/2024)   Received from [facility]   B1300 Health Literacy    How often do you need to have someone help you when you read instructions, pamphlets, or other written material from your doctor or pharmacy?: Never    Past Surgical History:  Procedure Laterality Date   APPENDECTOMY     ATRIAL FIBRILLATION ABLATION N/A 05/31/2021   Procedure: ATRIAL FIBRILLATION ABLATION;  Surgeon: Cindie Ole DASEN, MD;  Location: [facility] INVASIVE CV LAB;  Service: Cardiovascular;  Laterality: N/A;   ATRIAL FIBRILLATION ABLATION N/A 09/19/2023   Procedure: ATRIAL FIBRILLATION ABLATION;  Surgeon: Cindie Ole DASEN, MD;  Location:  [facility] INVASIVE CV LAB;  Service: Cardiovascular;  Laterality: N/A;   BICEPT TENODESIS Left 11/03/2023   Procedure: TENODESIS, BICEPS;  Surgeon: Tobie Priest, MD;   Location: [facility] ORS;  Service: Orthopedics;  Laterality: Left;   CARDIOVERSION N/A 04/02/2021   Procedure: CARDIOVERSION;  Surgeon: Hobart Powell BRAVO, MD;  Location: [facility] ENDOSCOPY;  Service: Cardiovascular;  Laterality: N/A;   CARDIOVERSION N/A 07/28/2021   Procedure: CARDIOVERSION;  Surgeon: Alvan Ronal BRAVO, MD;  Location: [facility] ENDOSCOPY;  Service: Cardiovascular;  Laterality: N/A;   CATARACT EXTRACTION W/ INTRAOCULAR LENS IMPLANT Right 08/22/2023   CATARACT EXTRACTION W/ INTRAOCULAR LENS IMPLANT Left 08/29/2023   COLONOSCOPY WITH PROPOFOL  N/A 12/04/2019   Procedure: COLONOSCOPY WITH PROPOFOL ;  Surgeon: Dessa Reyes ORN, MD;  Location: [facility] ENDOSCOPY;  Service: Endoscopy;  Laterality: N/A;   CORONARY ANGIOPLASTY WITH STENT PLACEMENT Left 10/18/2005   Procedure: CORONARY ANGIOPLASTY WITH STENT PLACEMENT; Location: [facility]; Surgeon: Margie Lovelace, MD   CORONARY ARTERY BYPASS GRAFT N/A 02/24/2010   HYSTERECTOMY, VAGINAL, WITH SALPINGO-OOPHORECTOMY Right    LEFT HEART CATH AND CORONARY ANGIOGRAPHY Left 10/17/2005   Procedure: LEFT HEART CATH AND CORONARY ANGIOGRAPHY; Location: [facility]; Surgeon: Margie Lovelace, MD   SHOULDER ARTHROSCOPY WITH ROTATOR CUFF REPAIR Left 11/03/2023   Procedure: ARTHROSCOPY, SHOULDER, WITH ROTATOR CUFF REPAIR;  Surgeon: Tobie Priest, MD;  Location: [facility] ORS;  Service: Orthopedics;  Laterality: Left;  Left shoulder arthroscopic vs mini-open rotator cuff repair (subscapularis & supraspinatus), subacromial decompression, distal clavicle excision, and biceps tenodesis   SUBACROMIAL DECOMPRESSION Left 11/03/2023   Procedure: DECOMPRESSION, SUBACROMIAL SPACE;  Surgeon: Tobie Priest, MD;  Location: [facility] ORS;  Service: Orthopedics;  Laterality: Left;   SVT ABLATION N/A 09/04/2020   Procedure: SVT ABLATION;  Surgeon: Cindie Ole DASEN, MD;  Location: [facility] INVASIVE CV LAB;  Service: Cardiovascular;  Laterality: N/A;   TONSILLECTOMY      Family History  Problem Relation Age of Onset    Diabetes Mother    Stroke Mother        cause of death   Coronary artery disease Father    Heart attack Father    Hyperlipidemia Sister    Hypertension Sister    Breast cancer Sister 60   Cancer Sister        breast cancer   Asthma Sister    Scleroderma Sister    Thyroid  disease Sister    Heart attack Brother    Cancer - Colon Paternal Grandmother    Breast cancer Maternal Aunt        40s    Allergies[1]     Latest Ref Rng & Units 09/08/2023   10:58 AM 06/18/2023    2:42 PM 03/13/2023    9:39 AM  CBC  WBC 4.0 - 10.5 K/uL 6.2  7.6  6.3   Hemoglobin 12.0 - 15.0 g/dL 87.3  85.8  85.7   Hematocrit 36.0 - 46.0 % 37.2  42.0  44.2   Platelets 150 - 400 K/uL 233  197  275       CMP     Component Value Date/Time   NA 139 11/01/2023 1348   NA 145 (H) 04/06/2022 0954   K 3.5 11/01/2023 1348   CL 104 11/01/2023 1348   CO2 24 11/01/2023 1348   GLUCOSE 111 (H) 11/01/2023 1348   BUN 22 11/01/2023 1348   BUN 15 04/06/2022 0954   CREATININE 0.78 11/01/2023 1348   CALCIUM  9.3 11/01/2023 1348   PROT 7.0 09/08/2023 1058   PROT 7.0 04/06/2022 0954  ALBUMIN 4.2 09/08/2023 1058   ALBUMIN 4.5 04/06/2022 0954   AST 37 09/08/2023 1058   ALT 39 09/08/2023 1058   ALKPHOS 76 09/08/2023 1058   BILITOT 1.1 09/08/2023 1058   BILITOT 0.5 04/06/2022 0954   EGFR 71 04/06/2022 0954   GFRNONAA >60 11/01/2023 1348     VAS US  ABI WITH/WO TBI Result Date: 05/09/2024  LOWER EXTREMITY DOPPLER STUDY Patient Name:  Penny Reed  Date of Exam:   05/08/2024 Medical Rec #: [id number]         Accession #:    [id number] Date of Birth: Sep 26, 1945         Patient Gender: F Patient Age:   58 years Exam Location:  [facility] Procedure:      VAS US  ABI WITH/WO TBI Referring Phys: SELINDA GU --------------------------------------------------------------------------------  Indications: Claudication, and peripheral artery disease. High Risk Factors: Hypertension, hyperlipidemia, coronary artery  disease. Other Factors: Claudication has worsened slightly since prior exam.  Comparison Study: 09/2023 Performing Technologist: Jerel Croak RVT  Examination Guidelines: A complete evaluation includes at minimum, Doppler waveform signals and systolic blood pressure reading at the level of bilateral brachial, anterior tibial, and posterior tibial arteries, when vessel segments are accessible. Bilateral testing is considered an integral part of a complete examination. Photoelectric Plethysmograph (PPG) waveforms and toe systolic pressure readings are included as required and additional duplex testing as needed. Limited examinations for reoccurring indications may be performed as noted.  ABI Findings: +---------+------------------+-----+----------+--------+ Right    Rt Pressure (mmHg)IndexWaveform  Comment  +---------+------------------+-----+----------+--------+ Brachial 130                                       +---------+------------------+-----+----------+--------+ PTA      130               1.00 biphasic           +---------+------------------+-----+----------+--------+ DP       129               0.99 monophasic         +---------+------------------+-----+----------+--------+ Great Toe114               0.88 Normal             +---------+------------------+-----+----------+--------+ +---------+------------------+-----+--------+-------+ Left     Lt Pressure (mmHg)IndexWaveformComment +---------+------------------+-----+--------+-------+ Brachial 129                                    +---------+------------------+-----+--------+-------+ PTA      104               0.80 biphasic        +---------+------------------+-----+--------+-------+ DP       94                0.72 biphasic        +---------+------------------+-----+--------+-------+ Great Toe105               0.81 Normal          +---------+------------------+-----+--------+-------+  +-------+-----------+-----------+------------+------------+ ABI/TBIToday's ABIToday's TBIPrevious ABIPrevious TBI +-------+-----------+-----------+------------+------------+ Right  1.00       .88        .88         .61          +-------+-----------+-----------+------------+------------+ Left   .80        .  81        .71         .52          +-------+-----------+-----------+------------+------------+  Bilateral ABIs and TBIs appear increased compared to prior study on 09/2023.  Summary: Right: Resting right ankle-brachial index is within normal range. The right toe-brachial index is normal.  Left: Resting left ankle-brachial index indicates mild left lower extremity arterial disease. The left toe-brachial index is normal.  *See table(s) above for measurements and observations.  Electronically signed by Selinda Gu MD on 05/09/2024 at 7:09:26 AM.    Final        Assessment & Plan:   1. Atherosclerosis of native artery of both lower extremities with intermittent claudication (Primary) Peripheral artery disease with intermittent claudication, bilateral legs Chronic peripheral artery disease with stable, non-critical disease. Improved ABI and toe pressures bilaterally with biphasic waveforms. Exercise regimen effective. No surgical or endovascular intervention needed. Compression devices not indicated for current activity level. Compression stockings recommended for DVT risk during long flights. Continued stability expected with current management; risk of progression if symptoms worsen or red flag symptoms develop. - Reviewed recent vascular studies showing improved ABI and toe pressures bilaterally. - Recommended continued regular walking and exercise regimen, including treadmill and elliptical use. - Advised against routine use of leg compression devices for arterial disease given adequate activity level. - Recommended compression stockings during long flights to reduce DVT risk; instructed on  calf flexion and toe raises during travel. - Provided education on red flag symptoms and instructed to contact clinic if these occur. - Recommended follow-up in one year, with earlier evaluation if symptoms worsen or red flag symptoms develop. - Placed follow-up order for one year. - VAS US  ABI WITH/WO TBI; Future    2. Essential (primary) hypertension Continue antihypertensive medications as already ordered, these medications have been reviewed and there are no changes at this time.  3. Pure hypercholesterolemia Continue statin as ordered and reviewed, no changes at this time   Assessment and Plan Assessment & Plan      Medications Ordered Prior to Encounter[2]  There are no Patient Instructions on file for this visit. Return in about 1 year (around 05/08/2025) for ABIs see JD/FB.   Loys Hoselton E Kayani Rapaport, NP      [1]  Allergies Allergen Reactions   Amoxicillin Nausea Only   Adhesive [Tape] Other (See Comments)    Band-aids irritate skin   Azithromycin  Diarrhea and Nausea And Vomiting   Eliquis  [Apixaban ] Swelling    Leg and ankle swelling  [2]  Current Outpatient Medications on File Prior to Visit  Medication Sig Dispense Refill   albuterol  (PROAIR  HFA) 108 (90 BASE) MCG/ACT inhaler Inhale 2 puffs into the lungs every 6 (six) hours as needed for wheezing or shortness of breath. 3 Inhaler 3   amLODipine  (NORVASC ) 5 MG tablet Take 1 tablet (5 mg total) by mouth daily. 90 tablet 3   atorvastatin  (LIPITOR) 80 MG tablet TAKE 1 TABLET BY MOUTH EVERY DAY 90 tablet 3   Azelastine  HCl 137 MCG/SPRAY SOLN SPRAY 2 SPRAYS INTO EACH NOSTRIL EVERY DAY (Patient taking differently: Place 2 sprays into both nostrils at bedtime. SPRAY 2 SPRAYS INTO EACH NOSTRIL EVERY DAY) 30 mL 3   benazepril  (LOTENSIN ) 20 MG tablet Take 20 mg by mouth daily.     empagliflozin (JARDIANCE) 10 MG TABS tablet Take 10 mg by mouth daily.     levothyroxine  (SYNTHROID ) 112 MCG tablet TAKE 1 TABLET BY MOUTH  SIX DAYS  PER WEEK. TAKE 1 AND 1/2 TABLET BY MOUTH ONE DAY PER WEEK (WEDNESDAYS) 100 tablet 2   Multiple Vitamin (MULTIVITAMIN) tablet Take 1 tablet by mouth daily. Standard Process     nitroGLYCERIN (NITROSTAT) 0.4 MG SL tablet Place 0.4 mg under the tongue every 5 (five) minutes as needed for chest pain.      torsemide (DEMADEX) 20 MG tablet Take 20 mg by mouth daily.     umeclidinium-vilanterol (ANORO ELLIPTA ) 62.5-25 MCG/ACT AEPB INHALE 1 PUFF BY MOUTH EVERY DAY 60 each 7   XARELTO  20 MG TABS tablet TAKE 1 TABLET BY MOUTH DAILY WITH SUPPER. STOP ELIQUIS  (Patient taking differently: Take 20 mg by mouth daily.) 90 tablet 1   zolpidem  (AMBIEN ) 5 MG tablet Take 1 tablet (5 mg total) by mouth at bedtime as needed. for sleep 30 tablet 3   acetaminophen  (TYLENOL ) 500 MG tablet Take 2 tablets (1,000 mg total) by mouth every 8 (eight) hours. (Patient not taking: Reported on 05/08/2024) 90 tablet 2   metoprolol  succinate (TOPROL -XL) 25 MG 24 hr tablet Take 25 mg by mouth daily as needed (afib). (Patient not taking: Reported on 05/08/2024)     ondansetron  (ZOFRAN -ODT) 4 MG disintegrating tablet Take 1 tablet (4 mg total) by mouth every 8 (eight) hours as needed for nausea or vomiting. (Patient not taking: Reported on 05/08/2024) 20 tablet 0   oxyCODONE  (ROXICODONE ) 5 MG immediate release tablet Take 1-3 tablets (5-15 mg total) by mouth every 4 (four) hours as needed (pain). (Patient not taking: Reported on 05/08/2024) 45 tablet 0   No current facility-administered medications on file prior to visit.   "

## 2025-05-07 ENCOUNTER — Ambulatory Visit (INDEPENDENT_AMBULATORY_CARE_PROVIDER_SITE_OTHER): Admitting: Nurse Practitioner

## 2025-05-07 ENCOUNTER — Encounter (INDEPENDENT_AMBULATORY_CARE_PROVIDER_SITE_OTHER)
